# Patient Record
Sex: Female | Born: 1935 | Marital: Married | Smoking: Never smoker
Health system: Southern US, Community
[De-identification: ages and names within clinical notes are randomized; demographics above are authoritative.]

## PROBLEM LIST (undated history)

## (undated) DIAGNOSIS — I5022 Chronic systolic (congestive) heart failure: Secondary | ICD-10-CM

## (undated) DIAGNOSIS — I2510 Atherosclerotic heart disease of native coronary artery without angina pectoris: Secondary | ICD-10-CM

## (undated) DIAGNOSIS — I472 Ventricular tachycardia: Secondary | ICD-10-CM

## (undated) DIAGNOSIS — Z95810 Presence of automatic (implantable) cardiac defibrillator: Secondary | ICD-10-CM

## (undated) DIAGNOSIS — G309 Alzheimer's disease, unspecified: Secondary | ICD-10-CM

## (undated) DIAGNOSIS — I255 Ischemic cardiomyopathy: Secondary | ICD-10-CM

## (undated) DIAGNOSIS — F0280 Dementia in other diseases classified elsewhere without behavioral disturbance: Secondary | ICD-10-CM

## (undated) DIAGNOSIS — E785 Hyperlipidemia, unspecified: Secondary | ICD-10-CM

## (undated) DIAGNOSIS — E119 Type 2 diabetes mellitus without complications: Secondary | ICD-10-CM

## (undated) DIAGNOSIS — I4729 Other ventricular tachycardia: Secondary | ICD-10-CM

## (undated) DIAGNOSIS — I10 Essential (primary) hypertension: Secondary | ICD-10-CM

## (undated) HISTORY — PX: CORONARY ARTERY BYPASS GRAFT: SHX141

## (undated) HISTORY — PX: OTHER SURGICAL HISTORY: SHX169

## (undated) HISTORY — DX: Other ventricular tachycardia: I47.29

## (undated) HISTORY — DX: Ventricular tachycardia: I47.2

## (undated) HISTORY — DX: Ischemic cardiomyopathy: I25.5

---

## 2012-04-04 ENCOUNTER — Other Ambulatory Visit: Payer: Self-pay | Admitting: Physician Assistant

## 2012-04-04 ENCOUNTER — Encounter: Payer: Self-pay | Admitting: Physician Assistant

## 2012-04-04 DIAGNOSIS — I509 Heart failure, unspecified: Secondary | ICD-10-CM

## 2012-04-05 ENCOUNTER — Encounter: Payer: Self-pay | Admitting: Internal Medicine

## 2012-04-05 DIAGNOSIS — R0602 Shortness of breath: Secondary | ICD-10-CM

## 2012-04-05 DIAGNOSIS — R079 Chest pain, unspecified: Secondary | ICD-10-CM

## 2012-04-06 DIAGNOSIS — I5023 Acute on chronic systolic (congestive) heart failure: Secondary | ICD-10-CM

## 2012-04-07 ENCOUNTER — Encounter (HOSPITAL_COMMUNITY)
Admission: RE | Disposition: A | Discharge status: Home / Self Care | Payer: Self-pay | Source: Other Acute Inpatient Hospital | Attending: Cardiovascular Disease

## 2012-04-07 ENCOUNTER — Ambulatory Visit (HOSPITAL_COMMUNITY): Admission: EM | Admit: 2012-04-07 | Payer: Self-pay | Source: Ambulatory Visit | Admitting: Cardiovascular Disease

## 2012-04-07 ENCOUNTER — Other Ambulatory Visit: Payer: Self-pay | Admitting: Physician Assistant

## 2012-04-07 ENCOUNTER — Inpatient Hospital Stay (HOSPITAL_COMMUNITY)
Admission: RE | Admit: 2012-04-07 | Discharge: 2012-04-13 | DRG: 246 | Disposition: A | Discharge status: Home / Self Care | Payer: PRIVATE HEALTH INSURANCE | Source: Other Acute Inpatient Hospital | Attending: Cardiovascular Disease | Admitting: Cardiovascular Disease

## 2012-04-07 DIAGNOSIS — Z951 Presence of aortocoronary bypass graft: Secondary | ICD-10-CM

## 2012-04-07 DIAGNOSIS — I509 Heart failure, unspecified: Secondary | ICD-10-CM | POA: Diagnosis present

## 2012-04-07 DIAGNOSIS — I2589 Other forms of chronic ischemic heart disease: Secondary | ICD-10-CM | POA: Diagnosis present

## 2012-04-07 DIAGNOSIS — E7800 Pure hypercholesterolemia, unspecified: Secondary | ICD-10-CM | POA: Diagnosis present

## 2012-04-07 DIAGNOSIS — I10 Essential (primary) hypertension: Secondary | ICD-10-CM | POA: Diagnosis present

## 2012-04-07 DIAGNOSIS — F0280 Dementia in other diseases classified elsewhere without behavioral disturbance: Secondary | ICD-10-CM | POA: Diagnosis present

## 2012-04-07 DIAGNOSIS — E119 Type 2 diabetes mellitus without complications: Secondary | ICD-10-CM | POA: Diagnosis present

## 2012-04-07 DIAGNOSIS — I447 Left bundle-branch block, unspecified: Secondary | ICD-10-CM | POA: Diagnosis present

## 2012-04-07 DIAGNOSIS — Z79899 Other long term (current) drug therapy: Secondary | ICD-10-CM

## 2012-04-07 DIAGNOSIS — Z955 Presence of coronary angioplasty implant and graft: Secondary | ICD-10-CM

## 2012-04-07 DIAGNOSIS — I5021 Acute systolic (congestive) heart failure: Secondary | ICD-10-CM

## 2012-04-07 DIAGNOSIS — Z7982 Long term (current) use of aspirin: Secondary | ICD-10-CM

## 2012-04-07 DIAGNOSIS — G309 Alzheimer's disease, unspecified: Secondary | ICD-10-CM | POA: Diagnosis present

## 2012-04-07 DIAGNOSIS — I2510 Atherosclerotic heart disease of native coronary artery without angina pectoris: Principal | ICD-10-CM

## 2012-04-07 DIAGNOSIS — I5023 Acute on chronic systolic (congestive) heart failure: Secondary | ICD-10-CM

## 2012-04-07 DIAGNOSIS — Z7902 Long term (current) use of antithrombotics/antiplatelets: Secondary | ICD-10-CM

## 2012-04-07 DIAGNOSIS — I200 Unstable angina: Secondary | ICD-10-CM

## 2012-04-07 HISTORY — PX: LEFT AND RIGHT HEART CATHETERIZATION WITH CORONARY/GRAFT ANGIOGRAM: SHX5448

## 2012-04-07 HISTORY — DX: Dementia in other diseases classified elsewhere, unspecified severity, without behavioral disturbance, psychotic disturbance, mood disturbance, and anxiety: F02.80

## 2012-04-07 HISTORY — DX: Hyperlipidemia, unspecified: E78.5

## 2012-04-07 HISTORY — PX: CARDIAC CATHETERIZATION: SHX172

## 2012-04-07 HISTORY — DX: Chronic systolic (congestive) heart failure: I50.22

## 2012-04-07 HISTORY — DX: Alzheimer's disease, unspecified: G30.9

## 2012-04-07 HISTORY — DX: Essential (primary) hypertension: I10

## 2012-04-07 HISTORY — DX: Type 2 diabetes mellitus without complications: E11.9

## 2012-04-07 HISTORY — DX: Atherosclerotic heart disease of native coronary artery without angina pectoris: I25.10

## 2012-04-07 LAB — BASIC METABOLIC PANEL
Chloride: 95 mEq/L — ABNORMAL LOW (ref 96–112)
Creatinine, Ser: 0.91 mg/dL (ref 0.50–1.10)
GFR calc Af Amer: 70 mL/min — ABNORMAL LOW (ref >90)
Potassium: 3.7 mEq/L (ref 3.5–5.1)
Sodium: 139 mEq/L (ref 135–145)

## 2012-04-07 LAB — POCT I-STAT 3, ART BLOOD GAS (G3+)
TCO2: 33 mmol/L (ref 0–100)
pCO2 arterial: 45.4 mmHg — ABNORMAL HIGH (ref 35.0–45.0)
pH, Arterial: 7.451 — ABNORMAL HIGH (ref 7.350–7.450)

## 2012-04-07 LAB — POCT I-STAT 3, VENOUS BLOOD GAS (G3P V)
Bicarbonate: 31.6 mEq/L — ABNORMAL HIGH (ref 20.0–24.0)
pCO2, Ven: 49.6 mmHg (ref 45.0–50.0)
pH, Ven: 7.412 — ABNORMAL HIGH (ref 7.250–7.300)
pO2, Ven: 32 mmHg (ref 30.0–45.0)

## 2012-04-07 LAB — GLUCOSE, CAPILLARY
Glucose-Capillary: 120 mg/dL — ABNORMAL HIGH (ref 70–99)
Glucose-Capillary: 96 mg/dL (ref 70–99)

## 2012-04-07 LAB — CBC
Platelets: 254 10*3/uL (ref 150–400)
RDW: 13.1 % (ref 11.5–15.5)
WBC: 4.6 10*3/uL (ref 4.0–10.5)

## 2012-04-07 SURGERY — LEFT AND RIGHT HEART CATHETERIZATION WITH CORONARY/GRAFT ANGIOGRAM
Anesthesia: LOCAL

## 2012-04-07 MED ORDER — LIDOCAINE HCL (PF) 1 % IJ SOLN
INTRAMUSCULAR | Status: AC
  Filled 2012-04-07: qty 30

## 2012-04-07 MED ORDER — SODIUM CHLORIDE 0.9 % IV SOLN: 4.0000 mg | Freq: Four times a day (QID) | INTRAVENOUS | Status: DC | PRN

## 2012-04-07 MED ORDER — METFORMIN HCL 500 MG PO TABS
1000.0000 mg | ORAL_TABLET | Freq: Every morning | ORAL | Status: DC
  Filled 2012-04-07 (×2): qty 2

## 2012-04-07 MED ORDER — GLYBURIDE 1.25 MG PO TABS: 5.0000 mg | ORAL_TABLET | Freq: Every morning | ORAL | Status: DC

## 2012-04-07 MED ORDER — ENOXAPARIN SODIUM 150 MG/ML ~~LOC~~ SOLN: 40.0000 mg | SUBCUTANEOUS | Status: DC

## 2012-04-07 MED ORDER — FERROUS GLUCONATE 324 (38 FE) MG PO TABS: 324.0000 mg | ORAL_TABLET | Freq: Every morning | ORAL | Status: DC

## 2012-04-07 MED ORDER — CARVEDILOL 3.125 MG PO TABS: 3.1250 mg | ORAL_TABLET | Freq: Two times a day (BID) | ORAL | Status: DC

## 2012-04-07 MED ORDER — SPIRONOLACTONE 12.5 MG HALF TABLET: 25.0000 mg | ORAL_TABLET | Freq: Every day | ORAL | Status: DC

## 2012-04-07 MED ORDER — SPIRONOLACTONE 25 MG PO TABS
25.0000 mg | ORAL_TABLET | Freq: Every day | ORAL | Status: DC
  Administered 2012-04-08 – 2012-04-13 (×6): 25 mg via ORAL
  Filled 2012-04-07 (×6): qty 1

## 2012-04-07 MED ORDER — ONDANSETRON HCL 4 MG/2ML IJ SOLN: 4.0000 mg | Freq: Four times a day (QID) | INTRAMUSCULAR | Status: DC | PRN

## 2012-04-07 MED ORDER — ENOXAPARIN SODIUM 40 MG/0.4ML ~~LOC~~ SOLN
40.0000 mg | SUBCUTANEOUS | Status: DC
  Administered 2012-04-07 – 2012-04-10 (×4): 40 mg via SUBCUTANEOUS
  Filled 2012-04-07 (×5): qty 0.4

## 2012-04-07 MED ORDER — GLYBURIDE 5 MG PO TABS
5.0000 mg | ORAL_TABLET | Freq: Every morning | ORAL | Status: DC
  Administered 2012-04-08 – 2012-04-10 (×3): 5 mg via ORAL
  Filled 2012-04-07 (×4): qty 1

## 2012-04-07 MED ORDER — ASPIRIN 81 MG PO CHEW: 324.0000 mg | CHEWABLE_TABLET | ORAL | Status: DC

## 2012-04-07 MED ORDER — SODIUM CHLORIDE 0.9 % IV SOLN
INTRAVENOUS | Status: AC
  Administered 2012-04-07: 19:00:00 via INTRAVENOUS

## 2012-04-07 MED ORDER — CARVEDILOL 3.125 MG PO TABS
3.1250 mg | ORAL_TABLET | Freq: Two times a day (BID) | ORAL | Status: DC
  Administered 2012-04-07 – 2012-04-12 (×10): 3.125 mg via ORAL
  Filled 2012-04-07 (×14): qty 1

## 2012-04-07 MED ORDER — NITROGLYCERIN 0.4 MG SL SUBL: 0.4000 mg | SUBLINGUAL_TABLET | SUBLINGUAL | Status: DC | PRN

## 2012-04-07 MED ORDER — SODIUM CHLORIDE 0.9 % IV SOLN
INTRAVENOUS | Status: DC
  Administered 2012-04-07: 15:00:00 via INTRAVENOUS

## 2012-04-07 MED ORDER — FERROUS GLUCONATE 324 (38 FE) MG PO TABS
324.0000 mg | ORAL_TABLET | Freq: Every day | ORAL | Status: DC
  Administered 2012-04-08 – 2012-04-13 (×6): 324 mg via ORAL
  Filled 2012-04-07 (×8): qty 1

## 2012-04-07 MED ORDER — ATORVASTATIN CALCIUM 10 MG PO TABS: 40.0000 mg | ORAL_TABLET | Freq: Every day | ORAL | Status: DC

## 2012-04-07 MED ORDER — DOCUSATE SODIUM 100 MG PO CAPS
100.0000 mg | ORAL_CAPSULE | Freq: Two times a day (BID) | ORAL | Status: DC
  Administered 2012-04-07 – 2012-04-13 (×12): 100 mg via ORAL
  Filled 2012-04-07 (×14): qty 1

## 2012-04-07 MED ORDER — NITROGLYCERIN 0.2 MG/ML ON CALL CATH LAB
INTRAVENOUS | Status: AC
  Filled 2012-04-07: qty 1

## 2012-04-07 MED ORDER — DONEPEZIL HCL 5 MG PO TABS
5.0000 mg | ORAL_TABLET | Freq: Every day | ORAL | Status: DC
  Administered 2012-04-07 – 2012-04-10 (×4): 5 mg via ORAL
  Filled 2012-04-07 (×5): qty 1

## 2012-04-07 MED ORDER — MEMANTINE HCL 5 MG PO TABS: 5.0000 mg | ORAL_TABLET | Freq: Two times a day (BID) | ORAL | Status: DC

## 2012-04-07 MED ORDER — DIAZEPAM 5 MG PO TABS
5.0000 mg | ORAL_TABLET | ORAL | Status: AC
  Administered 2012-04-07: 5 mg via ORAL
  Filled 2012-04-07: qty 1

## 2012-04-07 MED ORDER — ACETAMINOPHEN 325 MG PO TABS: 650.0000 mg | ORAL_TABLET | ORAL | Status: DC | PRN

## 2012-04-07 MED ORDER — HEPARIN (PORCINE) IN NACL 2-0.9 UNIT/ML-% IJ SOLN
INTRAMUSCULAR | Status: AC
  Filled 2012-04-07: qty 2000

## 2012-04-07 MED ORDER — MEMANTINE HCL 5 MG PO TABS
5.0000 mg | ORAL_TABLET | Freq: Two times a day (BID) | ORAL | Status: DC
  Administered 2012-04-07 – 2012-04-11 (×8): 5 mg via ORAL
  Filled 2012-04-07 (×9): qty 1

## 2012-04-07 MED ORDER — SODIUM CHLORIDE 0.45 % IV SOLN: INTRAVENOUS | Status: DC

## 2012-04-07 MED ORDER — ATORVASTATIN CALCIUM 40 MG PO TABS
40.0000 mg | ORAL_TABLET | Freq: Every day | ORAL | Status: DC
  Administered 2012-04-07 – 2012-04-12 (×6): 40 mg via ORAL
  Filled 2012-04-07 (×8): qty 1

## 2012-04-07 MED ORDER — METFORMIN HCL 500 MG PO TABS: 1000.0000 mg | ORAL_TABLET | Freq: Every morning | ORAL | Status: DC

## 2012-04-07 MED ORDER — LEVOTHYROXINE SODIUM 25 MCG PO TABS
25.0000 ug | ORAL_TABLET | Freq: Every day | ORAL | Status: DC
  Administered 2012-04-08 – 2012-04-13 (×6): 25 ug via ORAL
  Filled 2012-04-07 (×8): qty 1

## 2012-04-07 MED ORDER — LISINOPRIL 10 MG PO TABS
10.0000 mg | ORAL_TABLET | Freq: Every day | ORAL | Status: DC
  Administered 2012-04-08 – 2012-04-13 (×6): 10 mg via ORAL
  Filled 2012-04-07 (×6): qty 1

## 2012-04-07 MED ORDER — DONEPEZIL HCL 5 MG PO TABS: 5.0000 mg | ORAL_TABLET | Freq: Every day | ORAL | Status: DC

## 2012-04-07 MED ORDER — LISINOPRIL 2.5 MG PO TABS: 10.0000 mg | ORAL_TABLET | Freq: Every day | ORAL | Status: DC

## 2012-04-07 MED ORDER — FUROSEMIDE 40 MG PO TABS
40.0000 mg | ORAL_TABLET | Freq: Two times a day (BID) | ORAL | Status: DC
  Administered 2012-04-07 – 2012-04-08 (×2): 40 mg via ORAL
  Filled 2012-04-07 (×4): qty 1

## 2012-04-07 MED ORDER — FUROSEMIDE 20 MG PO TABS: 40.0000 mg | ORAL_TABLET | Freq: Two times a day (BID) | ORAL | Status: DC

## 2012-04-07 MED ORDER — LEVOTHYROXINE SODIUM 25 MCG PO TABS: 25.0000 ug | ORAL_TABLET | Freq: Every day | ORAL | Status: DC

## 2012-04-07 MED ORDER — BISACODYL 5 MG PO TBEC
5.0000 mg | DELAYED_RELEASE_TABLET | Freq: Every day | ORAL | Status: DC | PRN
  Filled 2012-04-07 (×2): qty 1

## 2012-04-07 NOTE — CV Procedure (Signed)
Cardiac Cath Note  Dennisha Mouser [id number] Jan 06, 1935  Procedure: right and Left  Heart Cardiac Catheterization Note Indications: Pt has a hx of CAD, s/p CABG in 2009 in [place], [place].  She was transferred from [facility] today for worsening CHF and +/- chest pressure.  She was scheduled for a right and left heart cath.  Procedure Details Consent: Obtained Time Out: Verified patient identification, verified procedure, site/side was marked, verified correct patient position, special equipment/implants available, Radiology Safety Procedures followed,  medications/allergies/relevent history reviewed, required imaging and test results available.  Performed   Medications: Fentanyl: 0 Versed: 0 Lidocaine 20 cc  The right femoral artery and right femoral vein were easily canulated using a modified Seldinger technique.  Hemodynamics:   RA: 4/4 RV: 30/3 PCWP: 9/4 PA:  29/11  Cardiac Output   Thermodilution: 3.39 Index of 2.1  Fick : 3.78   Index 1.88  Arterial Sat: 94% PA Sat: 60%.  LV pressure: 151/8 Aortic pressure: 152/67  Angiography   Left Main: smooth and normal  Left anterior Descending:   Moderate disease in the proximal segment between 50-70%.  The LAD gives off a small diagonal and then has a lucent 70% stenosis.  Competitive filling can be seen coming from the IMA graft.  Ramus Intermediate :  The ramus intermediate is a moderate vessel and is occluded at it's mid point.    Left Circumflex: The LCx is a small - moderate sized vessel .  The OM branches are occluded.  Right Coronary Artery:  Large and dominant.  The entire RCA is moderately diffusely diseased.  There is a tortous 40% stenosis in the proximal vessel.  The mid RCA has a tight 95% stenosis.  There is brisk flow through this stenosis.  The distal RCA has a 30-40% stenosis .  The PDA has a 70% stenosis in the proximal aspect of the branch.  The PLSA has no significant disease.  SVG to Ramus :   The more superior graft is assumed to be to the Ramus branch.   Nice graft.  No stenosis in the graft or native vessel ( assumed to be Ramus)  SVG to Obtuse Marginal :  ( the inferior graft)  Normal graft. Normal native vessel.  LIMA to LAD:  Normal graft.  The anastomosis is normal.  There is competitive flow from the native LAD.  LV Gram: done with hand injection of 7 cc contrast. Severe LV dysfunction.  EF 25-30%  Complications: No apparent complications Patient did tolerate procedure well.  Contrast used: 92 cc   Conclusions:   1. Severe native CAD .  She has patent grafts to the LAD, OM, and assumed ramus branch.  The native RCA does not have any grafts and has a tight 95% stenosis in the mid segment.    The patient was not consented for PCI prior to arriving in the cath lab and she had already been given valium.  We will treat with heparin overnight and plan PCI on Tuesday.   In addition, there was some concern about her renal function and limiting contrast.  Staging the PCI will allow her to clear all of the contrast given and minimize risk of contrast induced nephropathy.  2. Severe LV dysfunction:  She has an EF of 25-30 %.  Her filling pressures are low / normal.  I do not think she needs any further diuresis over what she is getting currently.    Vesta Mixer, Montez Hageman., MD, [facility]  04/07/2012, 5:51 PM Office - [phone] Pager [phone]

## 2012-04-07 NOTE — Interval H&P Note (Signed)
History and Physical Interval Note:  04/07/2012 5:49 PM  Twanda Stakes  has presented today for surgery, with the diagnosis of Chest pain  The various methods of treatment have been discussed with the patient and family. After consideration of risks, benefits and other options for treatment, the patient has consented to  Procedure(s) (LRB): LEFT AND RIGHT HEART CATHETERIZATION WITH CORONARY/GRAFT ANGIOGRAM (N/A) as a surgical intervention .  The patient's history has been reviewed, patient examined, no change in status, stable for surgery.  I have reviewed the patient's chart and labs.  Questions were answered to the patient's satisfaction.     Elyn Aquas.  The consent is for diagnostic cath only.  PCI was not included in the original consent.  The patient has already had valium and we are not able to consent for ad hoc PCI if needed.  Vesta Mixer, Montez Hageman., MD, [facility] 04/07/2012, 5:50 PM Office - [phone] Pager [phone]

## 2012-04-07 NOTE — H&P (View-Only) (Signed)
NAME:  [name], CLAASSEN MAE MOYER ROOM: 208  UNIT NUMBER:  [id number] LOCATION: 31F 208 01 ADM/VISIT DATE:  04/04/2012   ADM Vaughan BrownerKathaleen Grinder:  [id number] DOB: January 07, 1935   PRIMARY CARE PHYSICIAN:  [name], M.D.  REASON FOR CONSULTATION:  Shortness of breath with possible heart failure.  HISTORY OF PRESENT ILLNESS:  The patient is a 77 year old female with a prior history of coronary artery disease, status post coronary artery bypass grafting in 2009 performed at [facility].  The patient is still followed in [facility] by Dr. Vear Clock.  According to her history, she has had no repeat cardiac catheterization after her surgery.  It also does not appear that she had any hospitalizations over the last several years for heart failure.  She was last seen, according to records that I received from [facility], in 11/2010 by Dr. Vear Clock, and the patient appeared to be doing well.  The patient now reports that over the last several days, she developed increased shortness of breath and has noticed lower extremity edema.  She called Dr. Sherryll Burger and wanted to be seen in the office, but unfortunately, there was no availability and she was advised to go to the emergency room.  On admission, she was found to be in heart failure.  The patient was given Lasix with improvement in her symptoms.  She reports that the last 2 days, she has had significant trouble breathing just doing her daily activities.  She also reports orthopnea and PND.  She does not report any chest pain, either at rest or on exertion.  She also does not recall over the last several months any episodes of severe substernal chest pain.  EKG done in the emergency room showed sinus rhythm with left bundle branch block, and it is unclear if this bundle branch block is new or not.  In the interim, the patient has ruled out for myocardial infarction with troponins that are respectively 0.02, 0.03 and 0.04.  Her BNP level was elevated at 378.  She stated that her breathing has  improved after she received Lasix.  The patient does report a history of hypertension and was on several medications, but appeared to be only on hydrochlorothiazide for blood pressure control.  The patient also has diabetes mellitus for which she takes metformin and glyburide.  We have been asked to consult on the patient for evaluation of congestive heart failure and further workup.  ALLERGIES:  No known drug allergies.  MEDICATIONS: 1. Metformin 1000 mg p.o. daily. 2. Hydrochlorothiazide 25 mg p.o. daily. 3. Glyburide 5 mg p.o. daily. 4. Aricept 5 mg p.o. daily. 5. Namenda 5 mg p.o. b.i.d.  PAST MEDICAL HISTORY: 1. Diabetes mellitus. 2. Hypertension. 3. Coronary artery bypass grafting. 4. History of goiter.  SOCIAL HISTORY:  The patient denies any tobacco use, alcohol or drug use.  She lives with her husband.  FAMILY HISTORY:  Noncontributory.  REVIEW OF SYSTEMS:  The patient denies any chest pain, shortness of breath and other cardiovascular are as outlined above.  She denies any nausea or vomiting.  No fever or chills.  No melena or hematochezia.  The remainder of the review of systems is otherwise within normal limits.  PHYSICAL EXAMINATION:  Vital signs:  Blood pressure is 155/67, heart rate 69, respirations 20, temperature 97.9, saturation 100% on 2 liters O2.  General:  A well-nourished, African American female in no distress, not complaining of shortness of breath.  HEENT:  Pupils isochoric, conjunctivae clear.  Oropharynx is clear.  No exudates.  Neck:  Normal carotid upstroke and no carotid bruit.  The thyroid appears to be enlarged on palpation, but is not nodular.  JVP was difficult to evaluate, but appears to be around 8 cm at a 60-degree angle.  Lungs:  Clear breath sounds bilaterally with faint crackles at the bases.  Heart:  PMI is displaced laterally, and there is a palpable third heart sound.  Normal S1, S2.  There is a soft, holosystolic murmur at the apex radiating  into the axilla.  This appears to be consistent with mitral regurgitation, but is hard to quantitate.  Abdomen:  Soft, nontender.  No rebound or guarding.  No hepatosplenomegaly.  Urogenital:  Deferred.  Extremity:  1+ peripheral pitting edema, but no cyanosis or clubbing.  Vascular:  Femoral pulses are 2+ bilaterally with no bruits.  Normal dorsalis pedis and posterior tibial pulses bilaterally.  Neurologic:  The patient is alert, oriented and grossly nonfocal.  Psychiatric:  Somewhat flat affect.  I did not specifically question the patient for short-term memory loss or long-term memory loss, but she does appear that she may have a mild degree of dementia.  LABORATORY WORK:  Blood count:  Within normal limits with a white count of 6100 and a hemoglobin of 12.1, platelet count 213,000.  BMET:  Glucose 200, BUN 13, creatinine 0.84, potassium 3.9.  Cardiac enzymes:  As listed in the HPI, are within normal limits.  X-ray:  Reveals vascular congestion with cardiomegaly and evidence of prior bypass surgery with sternal wires.  BNP is 378.  12-lead electrocardiogram:  Normal sinus rhythm with left bundle branch block.  ASSESSMENT: 1. Acute systolic heart failure. a. Palpable S3 on exam. b. Bedside echocardiogram reveals ejection fraction of 20% to 25% with multiple segmental wall motion abnormalities. c. Elevated beta natriuretic peptide level. 2. Coronary artery disease, status post coronary artery bypass grafting. a. Multiple wall motion abnormalities with evidence of scar in the entire anterior wall and anterior septum.  Also, evidence of scar tissue in the inferior wall and part of the posterior wall. b. Associated mitral regurgitation secondary to #2a.  Evidence of tethered posterior mitral valve leaflets secondary to akinetic segments. 3. Diabetes mellitus. 4. Hypertension, poorly controlled.  PLAN: 1. The patient is followed in [facility] by Dr. Vear Clock.  It is not entirely clear when she was last  seen.  The last record in the chart that we have received notes that she was seen in 11/2010.  She was doing well at that time. 2. It appears that the patient has developed new onset systolic heart failure.  I do not have any measurements on her prior ejection fraction, but clearly by bedside echocardiogram, her ejection fraction is significantly depressed in the range of 20% to 25% with mild to moderate mitral regurgitation and multiple segmental wall motion abnormalities.  There is a tethered posterior mitral valve leaflet, consistent with ischemic mitral regurgitation. 3. The patient's symptoms appear to have worsened over the last couple of days.  Her bypass surgery was in 2009, and she certainly could have developed ischemic heart disease as an angina equivalent in the setting of diabetes mellitus.  I anticipate that the patient will need a cardiac catheterization, but she will require a medical tune-up and optimization of heart failure medications.  She has been started on low-dose Coreg.  We started also lisinopril, and I started the patient on Aldactone in conjunction with her Lasix.  We will follow closely her BMET.  I scheduled  the patient for a rest redistribution thallium 201 study in the a.m. to make sure that she has a viable myocardium, particularly in the anterior wall, inferior wall and posterior wall, and that she could benefit from revascularization therapy.  If she has no evidence of hibernating myocardium, I will try to treat the patient medically first, and then we can leave the decision for cardiac catheterization to her cardiologist, Dr. Vear Clock.  However, if the patient has significant viability, she has agreed to proceed with cardiac catheterization, and she is willing to be transferred to [facility] tomorrow to have a left and right heart catheterization done, including assessment of her grafts.   __________________________    Lewayne Bunting, M.D. Alinda Money D: 04/05/2012  1114 T: 04/05/2012 1207 P: DEG  cc:  [name], M.D.

## 2012-04-07 NOTE — H&P (Signed)
NAME:  [name], [name] ROOM: 208  UNIT NUMBER:  [id number] LOCATION: 2F 208 01 ADM/VISIT DATE:  04/04/2012   ADM PHYS:  ACCT:  [id number] DOB: 12/20/1934   PRIMARY CARE PHYSICIAN:  Ashish Shah, M.D.  REASON FOR CONSULTATION:  Shortness of breath with possible heart failure.  HISTORY OF PRESENT ILLNESS:  The patient is a 77-year-old female with a prior history of coronary artery disease, status post coronary artery bypass grafting in 2009 performed at [place].  The patient is still followed in [place] by Dr. Phillips.  According to her history, she has had no repeat cardiac catheterization after her surgery.  It also does not appear that she had any hospitalizations over the last several years for heart failure.  She was last seen, according to records that I received from [place], in 11/2010 by Dr. Phillips, and the patient appeared to be doing well.  The patient now reports that over the last several days, she developed increased shortness of breath and has noticed lower extremity edema.  She called Dr. Shah and wanted to be seen in the office, but unfortunately, there was no availability and she was advised to go to the emergency room.  On admission, she was found to be in heart failure.  The patient was given Lasix with improvement in her symptoms.  She reports that the last 2 days, she has had significant trouble breathing just doing her daily activities.  She also reports orthopnea and PND.  She does not report any chest pain, either at rest or on exertion.  She also does not recall over the last several months any episodes of severe substernal chest pain.  EKG done in the emergency room showed sinus rhythm with left bundle branch block, and it is unclear if this bundle branch block is new or not.  In the interim, the patient has ruled out for myocardial infarction with troponins that are respectively 0.02, 0.03 and 0.04.  Her BNP level was elevated at 378.  She stated that her breathing has  improved after she received Lasix.  The patient does report a history of hypertension and was on several medications, but appeared to be only on hydrochlorothiazide for blood pressure control.  The patient also has diabetes mellitus for which she takes metformin and glyburide.  We have been asked to consult on the patient for evaluation of congestive heart failure and further workup.  ALLERGIES:  No known drug allergies.  MEDICATIONS: 1. Metformin 1000 mg p.o. daily. 2. Hydrochlorothiazide 25 mg p.o. daily. 3. Glyburide 5 mg p.o. daily. 4. Aricept 5 mg p.o. daily. 5. Namenda 5 mg p.o. b.i.d.  PAST MEDICAL HISTORY: 1. Diabetes mellitus. 2. Hypertension. 3. Coronary artery bypass grafting. 4. History of goiter.  SOCIAL HISTORY:  The patient denies any tobacco use, alcohol or drug use.  She lives with her husband.  FAMILY HISTORY:  Noncontributory.  REVIEW OF SYSTEMS:  The patient denies any chest pain, shortness of breath and other cardiovascular are as outlined above.  She denies any nausea or vomiting.  No fever or chills.  No melena or hematochezia.  The remainder of the review of systems is otherwise within normal limits.  PHYSICAL EXAMINATION:  Vital signs:  Blood pressure is 155/67, heart rate 69, respirations 20, temperature 97.9, saturation 100% on 2 liters O2.  General:  A well-nourished, African American female in no distress, not complaining of shortness of breath.  HEENT:  Pupils isochoric, conjunctivae clear.  Oropharynx is clear.    No exudates.  Neck:  Normal carotid upstroke and no carotid bruit.  The thyroid appears to be enlarged on palpation, but is not nodular.  JVP was difficult to evaluate, but appears to be around 8 cm at a 60-degree angle.  Lungs:  Clear breath sounds bilaterally with faint crackles at the bases.  Heart:  PMI is displaced laterally, and there is a palpable third heart sound.  Normal S1, S2.  There is a soft, holosystolic murmur at the apex radiating  into the axilla.  This appears to be consistent with mitral regurgitation, but is hard to quantitate.  Abdomen:  Soft, nontender.  No rebound or guarding.  No hepatosplenomegaly.  Urogenital:  Deferred.  Extremity:  1+ peripheral pitting edema, but no cyanosis or clubbing.  Vascular:  Femoral pulses are 2+ bilaterally with no bruits.  Normal dorsalis pedis and posterior tibial pulses bilaterally.  Neurologic:  The patient is alert, oriented and grossly nonfocal.  Psychiatric:  Somewhat flat affect.  I did not specifically question the patient for short-term memory loss or long-term memory loss, but she does appear that she may have a mild degree of dementia.  LABORATORY WORK:  Blood count:  Within normal limits with a white count of 6100 and a hemoglobin of 12.1, platelet count 213,000.  BMET:  Glucose 200, BUN 13, creatinine 0.84, potassium 3.9.  Cardiac enzymes:  As listed in the HPI, are within normal limits.  X-ray:  Reveals vascular congestion with cardiomegaly and evidence of prior bypass surgery with sternal wires.  BNP is 378.  12-lead electrocardiogram:  Normal sinus rhythm with left bundle branch block.  ASSESSMENT: 1. Acute systolic heart failure. a. Palpable S3 on exam. b. Bedside echocardiogram reveals ejection fraction of 20% to 25% with multiple segmental wall motion abnormalities. c. Elevated beta natriuretic peptide level. 2. Coronary artery disease, status post coronary artery bypass grafting. a. Multiple wall motion abnormalities with evidence of scar in the entire anterior wall and anterior septum.  Also, evidence of scar tissue in the inferior wall and part of the posterior wall. b. Associated mitral regurgitation secondary to #2a.  Evidence of tethered posterior mitral valve leaflets secondary to akinetic segments. 3. Diabetes mellitus. 4. Hypertension, poorly controlled.  PLAN: 1. The patient is followed in [place] by Dr. Phillips.  It is not entirely clear when she was last  seen.  The last record in the chart that we have received notes that she was seen in 11/2010.  She was doing well at that time. 2. It appears that the patient has developed new onset systolic heart failure.  I do not have any measurements on her prior ejection fraction, but clearly by bedside echocardiogram, her ejection fraction is significantly depressed in the range of 20% to 25% with mild to moderate mitral regurgitation and multiple segmental wall motion abnormalities.  There is a tethered posterior mitral valve leaflet, consistent with ischemic mitral regurgitation. 3. The patient's symptoms appear to have worsened over the last couple of days.  Her bypass surgery was in 2009, and she certainly could have developed ischemic heart disease as an angina equivalent in the setting of diabetes mellitus.  I anticipate that the patient will need a cardiac catheterization, but she will require a medical tune-up and optimization of heart failure medications.  She has been started on low-dose Coreg.  We started also lisinopril, and I started the patient on Aldactone in conjunction with her Lasix.  We will follow closely her BMET.  I scheduled   the patient for a rest redistribution thallium 201 study in the a.m. to make sure that she has a viable myocardium, particularly in the anterior wall, inferior wall and posterior wall, and that she could benefit from revascularization therapy.  If she has no evidence of hibernating myocardium, I will try to treat the patient medically first, and then we can leave the decision for cardiac catheterization to her cardiologist, Dr. Phillips.  However, if the patient has significant viability, she has agreed to proceed with cardiac catheterization, and she is willing to be transferred to [place] tomorrow to have a left and right heart catheterization done, including assessment of her grafts.   __________________________    GUY DEGENT, M.D. /landm D: 04/05/2012  1114 T: 04/05/2012 1207 P: DEG  cc:  ASHISH SHAH, M.D.   

## 2012-04-07 NOTE — CV Procedure (Signed)
Rest redistribution thallium study was performed yesterday. There was good uptake of thallium throughout the myocardium during the rest images without any significant defects. On redistribution there was no filling of defects. 3 mCi of thallium 201 was used with 4 hour reimaging without reinjection. The patient has a history of coronary disease and coronary bypass grafting and based on this study despite her severe LV systolic dysfunction she appears to have significant viable myocardium.  Alvin Critchley Gent 1:09 PM 04/07/2012

## 2012-04-08 DIAGNOSIS — I509 Heart failure, unspecified: Secondary | ICD-10-CM

## 2012-04-08 DIAGNOSIS — I5021 Acute systolic (congestive) heart failure: Secondary | ICD-10-CM

## 2012-04-08 LAB — GLUCOSE, CAPILLARY: Glucose-Capillary: 191 mg/dL — ABNORMAL HIGH (ref 70–99)

## 2012-04-08 MED ORDER — ASPIRIN 81 MG PO CHEW
324.0000 mg | CHEWABLE_TABLET | Freq: Every day | ORAL | Status: DC
  Administered 2012-04-08 – 2012-04-10 (×3): 324 mg via ORAL
  Filled 2012-04-08 (×4): qty 4

## 2012-04-08 MED ORDER — FUROSEMIDE 40 MG PO TABS
40.0000 mg | ORAL_TABLET | Freq: Two times a day (BID) | ORAL | Status: AC
  Administered 2012-04-10 (×2): 40 mg via ORAL
  Filled 2012-04-08 (×2): qty 1

## 2012-04-08 MED ORDER — FUROSEMIDE 40 MG PO TABS
40.0000 mg | ORAL_TABLET | Freq: Two times a day (BID) | ORAL | Status: DC
  Administered 2012-04-12 (×2): 40 mg via ORAL
  Filled 2012-04-08 (×3): qty 1

## 2012-04-08 NOTE — Progress Notes (Signed)
Peyton Bottoms, MD, [facility] ABIM Board Certified in Adult Cardiovascular Medicine,Internal Medicine and Critical Care Medicine      Subjective:    S/p cath, no SCP. No SOB, chf well compensated. No complications post cath    Objective:   Weight Range:  Vital Signs:   Temp:  [97.6 F (36.4 C)-98.6 F (37 C)] 98.1 F (36.7 C) (08/31 0400) Pulse Rate:  [68-96] 89  (08/31 0400) Resp:  [18] 18  (08/30 1616) BP: (97-155)/(45-105) 120/78 mmHg (08/31 0600) SpO2:  [96 %-99 %] 99 % (08/31 0400) Weight:  [173 lb 12.8 oz (78.835 kg)] 173 lb 12.8 oz (78.835 kg) (08/30 1403) Last BM Date: 04/05/12  Weight change: Filed Weights   04/07/12 1403  Weight: 173 lb 12.8 oz (78.835 kg)    Intake/Output:  No intake or output data in the 24 hours ending 04/08/12 0853   Physical Exam: Gen:no distress, lying flat Neck: nl carotid upstroke, no bruits Lungs clear BS Cor: RRR soft systolic m. Audible S3 Abd; soft, non tender Ext ; no edema    Telemetry:NSR Labs: Basic Metabolic Panel:  Lab 04/07/12 2956  NA 139  K 3.7  CL 95*  CO2 33*  GLUCOSE 113*  BUN 19  CREATININE 0.91  CALCIUM 9.5  MG --  PHOS --    Liver Function Tests: No results found for this basename: AST:5,ALT:5,ALKPHOS:5,BILITOT:5,PROT:5,ALBUMIN:5 in the last 168 hours No results found for this basename: LIPASE:5,AMYLASE:5 in the last 168 hours No results found for this basename: AMMONIA:3 in the last 168 hours  CBC:  Lab 04/07/12 1531  WBC 4.6  NEUTROABS --  HGB 13.7  HCT 41.2  MCV 88.6  PLT 254    Cardiac Enzymes: No results found for this basename: CKTOTAL:5,CKMB:5,CKMBINDEX:5,TROPONINI:5 in the last 168 hours   BNP: BNP (last 3 results) No results found for this basename: PROBNP:3 in the last 8760 hours  ABG    Component Value Date/Time   PHART 7.451* 04/07/2012 1714   PCO2ART 45.4* 04/07/2012 1714   PO2ART 70.0* 04/07/2012 1714   HCO3 31.6* 04/07/2012 1719   TCO2 33 04/07/2012 1719   O2SAT  60.0 04/07/2012 1719     Other results:  EKG: NSR  Imaging:  No results found.   Medications:     Scheduled Medications:    . atorvastatin  40 mg Oral q1800  . carvedilol  3.125 mg Oral BID WC  . diazepam  5 mg Oral On Call  . docusate sodium  100 mg Oral BID  . donepezil  5 mg Oral QHS  . enoxaparin (LOVENOX) injection  40 mg Subcutaneous Q24H  . ferrous gluconate  324 mg Oral Q breakfast  . furosemide  40 mg Oral BID  . glyBURIDE  5 mg Oral q morning - 10a  . heparin      . levothyroxine  25 mcg Oral QAC breakfast  . lidocaine      . lisinopril  10 mg Oral Daily  . memantine  5 mg Oral BID  . metFORMIN  1,000 mg Oral q morning - 10a  . nitroGLYCERIN      . spironolactone  25 mg Oral Daily  . DISCONTD: aspirin  324 mg Oral Pre-Cath  . DISCONTD: aspirin  324 mg Oral NOW     Infusions:    . sodium chloride 75 mL/hr at 04/07/12 1859  . DISCONTD: sodium chloride 50 mL/hr at 04/07/12 1503     PRN Medications:  acetaminophen, bisacodyl, nitroGLYCERIN,  ondansetron (ZOFRAN) IV, DISCONTD: acetaminophen, DISCONTD: ondansetron (ZOFRAN) IV   Assessment:   1. Acute systolic CHF (congestive heart failure)    EF= 20-25% Filling pressures now normal S/p cath patent grafts to LAD, OM and ramus. Native RCA=95% Viable myocardium inferior wall on rest- redistribution thallium Anterior scar echo - but also viable.  Plan/Discussion:    Compensated CHF PCI native RCA Tuesday Hold metformin ASA not on med record Hold Lasix for 2 days - low filling pressures Monitor K on aldactone/lisinopril w/o lasix for 2 days.      Length of Stay: 1   Alvin Critchley [facility] 04/08/2012, 8:53 AM

## 2012-04-08 NOTE — Plan of Care (Signed)
Problem: Phase I Progression Outcomes Goal: Post Cath/PCI return to appropriate Path Outcome: Progressing PCI planned for Tues

## 2012-04-09 LAB — BASIC METABOLIC PANEL
BUN: 18 mg/dL (ref 6–23)
Creatinine, Ser: 0.87 mg/dL (ref 0.50–1.10)
GFR calc Af Amer: 74 mL/min — ABNORMAL LOW (ref >90)
GFR calc non Af Amer: 64 mL/min — ABNORMAL LOW (ref >90)
Potassium: 3.3 mEq/L — ABNORMAL LOW (ref 3.5–5.1)

## 2012-04-09 LAB — GLUCOSE, CAPILLARY
Glucose-Capillary: 239 mg/dL — ABNORMAL HIGH (ref 70–99)
Glucose-Capillary: 167 mg/dL — ABNORMAL HIGH (ref 70–99)

## 2012-04-09 MED ORDER — POTASSIUM CHLORIDE CRYS ER 20 MEQ PO TBCR
40.0000 meq | EXTENDED_RELEASE_TABLET | Freq: Once | ORAL | Status: AC
  Administered 2012-04-09: 40 meq via ORAL
  Filled 2012-04-09: qty 2

## 2012-04-09 NOTE — Progress Notes (Signed)
Peyton Bottoms, MD, [facility] ABIM Board Certified in Adult Cardiovascular Medicine,Internal Medicine and Critical Care Medicine      Subjective:    No sob, no SCP. No nausea, no fever chills     Objective:   Weight Range:  Vital Signs:   Temp:  [98.6 F (37 C)-98.9 F (37.2 C)] 98.8 F (37.1 C) (09/01 0440) Pulse Rate:  [75-78] 75  (09/01 0440) Resp:  [16-20] 16  (09/01 0440) BP: (118-153)/(67-76) 118/72 mmHg (09/01 0440) SpO2:  [92 %-95 %] 95 % (09/01 0440) Last BM Date: 04/07/12  Weight change: Filed Weights   04/07/12 1403  Weight: 173 lb 12.8 oz (78.835 kg)    Intake/Output:   Intake/Output Summary (Last 24 hours) at 04/09/12 0845 Last data filed at 04/09/12 0831  Gross per 24 hour  Intake    480 ml  Output    800 ml  Net   -320 ml     Physical Exam: Gen:no distress, lying flat  Neck: nl carotid upstroke, no bruits  Lungs clear BS  Cor: RRR soft systolic m. Audible S3  Abd; soft, non tender  Ext ; no edema   Telemetry: NSR Labs: Basic Metabolic Panel:  Lab 04/09/12 9604 04/07/12 1531  NA 140 139  K 3.3* 3.7  CL 99 95*  CO2 32 33*  GLUCOSE 165* 113*  BUN 18 19  CREATININE 0.87 0.91  CALCIUM 9.2 9.5  MG -- --  PHOS -- --    Liver Function Tests:   No results found for this basename: AMMONIA:3 in the last 168 hours  CBC:  Lab 04/07/12 1531  WBC 4.6  NEUTROABS --  HGB 13.7  HCT 41.2  MCV 88.6  PLT 254    Cardiac Enzymes: No results found for this basename: CKTOTAL:5,CKMB:5,CKMBINDEX:5,TROPONINI:5 in the last 168 hours   BNP: BNP (last 3 results) No results found for this basename: PROBNP:3 in the last 8760 hours  ABG    Component Value Date/Time   PHART 7.451* 04/07/2012 1714   PCO2ART 45.4* 04/07/2012 1714   PO2ART 70.0* 04/07/2012 1714   HCO3 31.6* 04/07/2012 1719   TCO2 33 04/07/2012 1719   O2SAT 60.0 04/07/2012 1719     Other results: EKG:NSR Imaging:  No results found.   Medications:     Scheduled  Medications:    . aspirin  324 mg Oral Daily  . atorvastatin  40 mg Oral q1800  . carvedilol  3.125 mg Oral BID WC  . docusate sodium  100 mg Oral BID  . donepezil  5 mg Oral QHS  . enoxaparin (LOVENOX) injection  40 mg Subcutaneous Q24H  . ferrous gluconate  324 mg Oral Q breakfast  . furosemide  40 mg Oral BID  . furosemide  40 mg Oral BID  . glyBURIDE  5 mg Oral q morning - 10a  . levothyroxine  25 mcg Oral QAC breakfast  . lisinopril  10 mg Oral Daily  . memantine  5 mg Oral BID  . metFORMIN  1,000 mg Oral q morning - 10a  . spironolactone  25 mg Oral Daily  . DISCONTD: furosemide  40 mg Oral BID     Infusions:     PRN Medications:  acetaminophen, bisacodyl, nitroGLYCERIN, ondansetron (ZOFRAN) IV   Assessment:   1. Acute systolic CHF (congestive heart failure)   EF= 20-25%  Filling pressures now normal  S/p cath patent grafts to LAD, OM and ramus. Native RCA=95%  Viable myocardium inferior wall  on rest- redistribution thallium  Anterior scar echo - but also viable.    Plan/Discussion:    PCI native RCA on Tuesday Hold metformin K low will give Kdur X1 Lasix on hold.     Length of Stay: 2   Alvin Critchley [facility] 04/09/2012, 8:45 AM

## 2012-04-10 ENCOUNTER — Encounter (HOSPITAL_COMMUNITY): Payer: Self-pay | Admitting: General Practice

## 2012-04-10 LAB — BASIC METABOLIC PANEL
Calcium: 9.4 mg/dL (ref 8.4–10.5)
Chloride: 101 mEq/L (ref 96–112)
Creatinine, Ser: 0.76 mg/dL (ref 0.50–1.10)
GFR calc Af Amer: >90 mL/min (ref >90)

## 2012-04-10 LAB — GLUCOSE, CAPILLARY
Glucose-Capillary: 146 mg/dL — ABNORMAL HIGH (ref 70–99)
Glucose-Capillary: 191 mg/dL — ABNORMAL HIGH (ref 70–99)

## 2012-04-10 MED ORDER — CLOPIDOGREL BISULFATE 75 MG PO TABS
300.0000 mg | ORAL_TABLET | Freq: Once | ORAL | Status: AC
  Administered 2012-04-10: 300 mg via ORAL
  Filled 2012-04-10: qty 4

## 2012-04-10 MED ORDER — ASPIRIN 81 MG PO CHEW
324.0000 mg | CHEWABLE_TABLET | ORAL | Status: AC
  Administered 2012-04-11: 324 mg via ORAL

## 2012-04-10 MED ORDER — CLOPIDOGREL BISULFATE 75 MG PO TABS
300.0000 mg | ORAL_TABLET | ORAL | Status: DC
  Filled 2012-04-10 (×2): qty 4

## 2012-04-10 MED ORDER — SODIUM CHLORIDE 0.9 % IV SOLN
1.0000 mL/kg/h | INTRAVENOUS | Status: DC
  Administered 2012-04-11 (×2): 1 mL/kg/h via INTRAVENOUS

## 2012-04-10 MED ORDER — CLOPIDOGREL BISULFATE 75 MG PO TABS: 75.0000 mg | ORAL_TABLET | Freq: Every day | ORAL | Status: DC

## 2012-04-10 MED ORDER — SODIUM CHLORIDE 0.9 % IV SOLN: 250.0000 mL | INTRAVENOUS | Status: DC | PRN

## 2012-04-10 MED ORDER — SODIUM CHLORIDE 0.9 % IJ SOLN: 3.0000 mL | INTRAMUSCULAR | Status: DC | PRN

## 2012-04-10 MED ORDER — SODIUM CHLORIDE 0.9 % IJ SOLN
3.0000 mL | Freq: Two times a day (BID) | INTRAMUSCULAR | Status: DC
  Administered 2012-04-10: 3 mL via INTRAVENOUS

## 2012-04-10 MED ORDER — CLOPIDOGREL BISULFATE 75 MG PO TABS
75.0000 mg | ORAL_TABLET | Freq: Every day | ORAL | Status: DC
  Administered 2012-04-12 – 2012-04-13 (×2): 75 mg via ORAL
  Filled 2012-04-10 (×2): qty 1

## 2012-04-10 NOTE — Progress Notes (Signed)
    Subjective:  No chest pain or dyspnea.  Objective:  Vital Signs in the last 24 hours: Temp:  [98.6 F (37 C)-99 F (37.2 C)] 98.6 F (37 C) (09/02 0500) Pulse Rate:  [72-83] 83  (09/02 0500) Resp:  [16-18] 16  (09/02 0500) BP: (129-146)/(77-81) 129/78 mmHg (09/02 0500) SpO2:  [94 %-95 %] 94 % (09/02 0500)  Intake/Output from previous day: 09/01 0701 - 09/02 0700 In: 600 [P.O.:600] Out: -   Physical Exam: Pt is alert and oriented, elderly woman in NAD HEENT: normal Neck: JVP - normal, carotids 2+= without bruits Lungs: CTA bilaterally CV: RRR with grade 2/6 systolic ejection murmur at the left sternal border, paradoxically split S2. Abd: soft, NT, Positive BS, no hepatomegaly Ext: no C/C/E, distal pulses intact and equal Skin: warm/dry no rash  Lab Results:  Basename 04/07/12 1531  WBC 4.6  HGB 13.7  PLT 254    Basename 04/10/12 0612 04/09/12 0500  NA 141 140  K 3.5 3.3*  CL 101 99  CO2 31 32  GLUCOSE 179* 165*  BUN 13 18  CREATININE 0.76 0.87   No results found for this basename: TROPONINI:2,CK,MB:2 in the last 72 hours  Tele: Personally reviewed, sinus rhythm.  Assessment/Plan:  1. Acute on chronic systolic heart failure with left ventricular ejection fraction less than 30%. 2. Severe multivessel coronary artery disease, with the critical mid right coronary artery stenosis (ungrafted vessel) 3. Type 2 diabetes  I have reviewed the patient's cardiac catheterization films. I agree that PCI of the right coronary artery is appropriate considering her congestive heart failure and demonstrated viability of the inferior wall. She will be loaded with Plavix. A P2Y12 test will be checked in the morning. The patient's films have been reviewed with Dr. Riley Kill who will perform her PCI.  Tonny Bollman, M.D. 04/10/2012, 9:48 AM

## 2012-04-11 ENCOUNTER — Encounter (HOSPITAL_COMMUNITY): Payer: Self-pay | Admitting: Cardiology

## 2012-04-11 ENCOUNTER — Encounter (HOSPITAL_COMMUNITY)
Admission: RE | Disposition: A | Discharge status: Home / Self Care | Payer: Self-pay | Source: Other Acute Inpatient Hospital | Attending: Cardiovascular Disease

## 2012-04-11 DIAGNOSIS — I2510 Atherosclerotic heart disease of native coronary artery without angina pectoris: Secondary | ICD-10-CM

## 2012-04-11 HISTORY — PX: PERCUTANEOUS CORONARY STENT INTERVENTION (PCI-S): SHX5485

## 2012-04-11 LAB — BASIC METABOLIC PANEL WITH GFR
BUN: 14 mg/dL (ref 6–23)
CO2: 31 meq/L (ref 19–32)
Calcium: 9.3 mg/dL (ref 8.4–10.5)
Chloride: 98 meq/L (ref 96–112)
Creatinine, Ser: 0.8 mg/dL (ref 0.50–1.10)
GFR calc Af Amer: 82 mL/min — ABNORMAL LOW
GFR calc non Af Amer: 70 mL/min — ABNORMAL LOW
Glucose, Bld: 176 mg/dL — ABNORMAL HIGH (ref 70–99)
Potassium: 3.7 meq/L (ref 3.5–5.1)
Sodium: 140 meq/L (ref 135–145)

## 2012-04-11 LAB — PROTIME-INR
INR: 1.03 (ref 0.00–1.49)
Prothrombin Time: 13.7 seconds (ref 11.6–15.2)

## 2012-04-11 LAB — POCT ACTIVATED CLOTTING TIME: Activated Clotting Time: 399 seconds

## 2012-04-11 LAB — GLUCOSE, CAPILLARY
Glucose-Capillary: 170 mg/dL — ABNORMAL HIGH (ref 70–99)
Glucose-Capillary: 205 mg/dL — ABNORMAL HIGH (ref 70–99)
Glucose-Capillary: 184 mg/dL — ABNORMAL HIGH (ref 70–99)

## 2012-04-11 LAB — PLATELET INHIBITION P2Y12: Platelet Function  P2Y12: 136 [PRU] — ABNORMAL LOW (ref 194–418)

## 2012-04-11 SURGERY — PERCUTANEOUS CORONARY STENT INTERVENTION (PCI-S)
Anesthesia: LOCAL

## 2012-04-11 MED ORDER — HEPARIN (PORCINE) IN NACL 2-0.9 UNIT/ML-% IJ SOLN
INTRAMUSCULAR | Status: AC
  Filled 2012-04-11: qty 2000

## 2012-04-11 MED ORDER — SODIUM CHLORIDE 0.9 % IV SOLN
INTRAVENOUS | Status: AC
  Administered 2012-04-11: 18:00:00 via INTRAVENOUS

## 2012-04-11 MED ORDER — NITROGLYCERIN IN D5W 200-5 MCG/ML-% IV SOLN
INTRAVENOUS | Status: AC
  Filled 2012-04-11: qty 250

## 2012-04-11 MED ORDER — GLYBURIDE 5 MG PO TABS
5.0000 mg | ORAL_TABLET | Freq: Every day | ORAL | Status: DC
Start: 2012-04-12 — End: 2012-04-13
  Administered 2012-04-12 – 2012-04-13 (×2): 5 mg via ORAL
  Filled 2012-04-11 (×3): qty 1

## 2012-04-11 MED ORDER — BIVALIRUDIN 250 MG IV SOLR
INTRAVENOUS | Status: AC
  Filled 2012-04-11: qty 250

## 2012-04-11 MED ORDER — LIDOCAINE HCL (PF) 1 % IJ SOLN
INTRAMUSCULAR | Status: AC
  Filled 2012-04-11: qty 30

## 2012-04-11 MED ORDER — ASPIRIN 81 MG PO CHEW
81.0000 mg | CHEWABLE_TABLET | Freq: Every day | ORAL | Status: DC
  Administered 2012-04-12 – 2012-04-13 (×2): 81 mg via ORAL
  Filled 2012-04-11 (×3): qty 1

## 2012-04-11 MED ORDER — ENOXAPARIN SODIUM 40 MG/0.4ML ~~LOC~~ SOLN: 40.0000 mg | SUBCUTANEOUS | Status: DC

## 2012-04-11 MED ORDER — ACETAMINOPHEN 325 MG PO TABS: 650.0000 mg | ORAL_TABLET | ORAL | Status: DC | PRN

## 2012-04-11 MED ORDER — CLOPIDOGREL BISULFATE 75 MG PO TABS: 300.0000 mg | ORAL_TABLET | Freq: Every day | ORAL | Status: DC

## 2012-04-11 MED ORDER — NITROGLYCERIN 0.2 MG/ML ON CALL CATH LAB
INTRAVENOUS | Status: AC
  Filled 2012-04-11: qty 1

## 2012-04-11 MED ORDER — CLOPIDOGREL BISULFATE 75 MG PO TABS
300.0000 mg | ORAL_TABLET | Freq: Once | ORAL | Status: AC
  Administered 2012-04-11: 300 mg via ORAL
  Filled 2012-04-11: qty 1

## 2012-04-11 MED ORDER — ONDANSETRON HCL 4 MG/2ML IJ SOLN: 4.0000 mg | Freq: Four times a day (QID) | INTRAMUSCULAR | Status: DC | PRN

## 2012-04-11 MED ORDER — SODIUM CHLORIDE 0.9 % IV SOLN
0.2500 mg/kg/h | INTRAVENOUS | Status: DC
  Filled 2012-04-11: qty 250

## 2012-04-11 MED ORDER — MIDAZOLAM HCL 2 MG/2ML IJ SOLN
INTRAMUSCULAR | Status: AC
  Filled 2012-04-11: qty 2

## 2012-04-11 MED ORDER — MEMANTINE HCL 5 MG PO TABS: 5.0000 mg | ORAL_TABLET | Freq: Two times a day (BID) | ORAL | Status: DC

## 2012-04-11 MED ORDER — ATROPINE SULFATE 1 MG/ML IJ SOLN
INTRAMUSCULAR | Status: AC
  Filled 2012-04-11: qty 1

## 2012-04-11 MED ORDER — DONEPEZIL HCL 5 MG PO TABS: 5.0000 mg | ORAL_TABLET | Freq: Every day | ORAL | Status: DC

## 2012-04-11 NOTE — CV Procedure (Signed)
   CARDIAC CATH NOTE  Name: Teleah Villamar MRN: [id number] DOB: [birth year]  Procedure: PTCA and stenting of the RCA  Indication: CHF with critical RCA disease.  See notes.    Procedural Details: The left groin was prepped, draped, and anesthetized with 1% lidocaine. Using the modified Seldinger technique, a 6 Fr sheath was introduced into the left femoral artery.  Weight-based bivalirudin was given for anticoagulation. Once a therapeutic ACT was achieved, a 6 [place] JR4 with [facility] guide catheter was inserted.  A Luge coronary guidewire was used to cross the lesion.  The lesion was predilated with a 2.49mm balloon.   We had difficulty getting the stent to the site of the lesion.  A Promus stent was abandoned for an Expedition stent after it would not go down with two wires.  The lesion was then stented with a 2.25 by 28mm Abbott Expedition stent.  The stent was postdilated with a 2.5  noncompliant balloon.  We then examined an area above the stent with hypodensity, representing either edge disruption or a bend where the previous stent ended.  It was not relieved with pulling back the wire or NTG.  We then stented with an overlapping 8.mm by 2.36mm Abbott Expedition stent.  Following PCI, there was 0% residual stenosis at the lesion sites  and TIMI-3 flow. Final angiography confirmed this.. The patient tolerated the procedure well. There were no immediate procedural complications. Femoral sheath was secured into place.. The patient was transferred to the post catheterization recovery area for further monitoring.  Lesion Data: Vessel: RCA Percent stenosis (pre): 95% TIMI-flow (pre):  3 Stent:  28 by 2.25 mm Expedition and 8mm by 2.25 Expedition overlapping stents mid vessel, post dil to 2.4 with .   Percent stenosis (post): 0 TIMI-flow (post): 3  Angio:  The RCA was known to be severely diseased.  There was 50% ostial narrowing.  There was 40%-50%  diffuse narrowing through the proximal Shepherd's crook.   There was typical diabetic vasculopathy throughout.  Near the mid distal junction, near the acute margin, there was a focal 95% lesion and then 70% segmental disease.  Beyond this, there was 50-60% diffuse narrowing, then typical pruned PDA and PLA vessels with evidence of diabetic disease. Following stenting, these were improved.    Conclusions:  1.  Successful mid vessel PCI with improved appearance of the target lesion in a significantly diseased diabetic artery.   Recommendations:  1.  DAPT  2.  CHF management. 3.  EP consult re: candidacy for Defib.    Shawnie Pons 04/11/2012, 6:36 PM

## 2012-04-11 NOTE — Interval H&P Note (Signed)
History and Physical Interval Note:  04/11/2012 4:05 PM  [name]  has presented today for surgery, with the diagnosis of cp  The various methods of treatment have been discussed with the patient and family. After consideration of risks, benefits and other options for treatment, the patient has consented to  Procedure(s) (LRB) with comments: PERCUTANEOUS CORONARY STENT INTERVENTION (PCI-S) (N/A) as a surgical intervention .  The patient's history has been reviewed, patient examined, no change in status, stable for surgery.  I have reviewed the patient's chart and labs.  Questions were answered to the patient's satisfaction.  All of her data was carefully reviewed.    Shawnie Pons

## 2012-04-11 NOTE — Progress Notes (Signed)
Inpatient Diabetes Program Recommendations  AACE/ADA: New Consensus Statement on Inpatient Glycemic Control (2013)  Target Ranges:  Prepandial:   less than 140 mg/dL      Peak postprandial:   less than 180 mg/dL (1-2 hours)      Critically ill patients:  140 - 180 mg/dL   Results for DELPHIA, KAYLOR (MRN [id number]) as of 04/11/2012 11:22  Ref. Range 04/10/2012 07:14 04/10/2012 11:41 04/10/2012 16:37 04/10/2012 19:31  Glucose-Capillary Latest Range: 70-99 mg/dL 782 (H) 956 (H) 213 (H) 217 (H)    Inpatient Diabetes Program Recommendations Correction (SSI): Please add Novolog Moderate correction scale (SSi) tid ac + HS. HgbA1C: Please check A1c to assess pre-hospital glucose control.  Note: Will follow. Ambrose Finland RN, MSN, CDE Diabetes Coordinator Inpatient Diabetes Program [phone]

## 2012-04-11 NOTE — H&P (View-Only) (Signed)
For PCI later today.  Dr. Excell Seltzer asked me to review the films.  She has LV dysfunction and ungrafted RCA with high grade disease, and viable inferior myocardium.  I explained to her the risks and options and she has consented to proceed.  She has a somewhat flat affect, and her husband was in the room.  We plan to proceed later today. She will be given a clear liquid breakfast.

## 2012-04-11 NOTE — Progress Notes (Signed)
Two RN on the floor attempted to start an IV on the pt and were unsuccessful. IV team was called. The pt's IV that she had in her RT forearm infiltrated with her Pre-Cath fluids and was removed with heat applied. IV team was unable to get two IVs started. Sanda Linger

## 2012-04-11 NOTE — Progress Notes (Signed)
For PCI later today.  Dr. Cooper asked me to review the films.  She has LV dysfunction and ungrafted RCA with high grade disease, and viable inferior myocardium.  I explained to her the risks and options and she has consented to proceed.  She has a somewhat flat affect, and her husband was in the room.  We plan to proceed later today. She will be given a clear liquid breakfast.   

## 2012-04-12 DIAGNOSIS — I5021 Acute systolic (congestive) heart failure: Secondary | ICD-10-CM | POA: Diagnosis present

## 2012-04-12 DIAGNOSIS — I10 Essential (primary) hypertension: Secondary | ICD-10-CM | POA: Diagnosis present

## 2012-04-12 DIAGNOSIS — E7800 Pure hypercholesterolemia, unspecified: Secondary | ICD-10-CM | POA: Diagnosis present

## 2012-04-12 DIAGNOSIS — E119 Type 2 diabetes mellitus without complications: Secondary | ICD-10-CM | POA: Diagnosis present

## 2012-04-12 DIAGNOSIS — Z9861 Coronary angioplasty status: Secondary | ICD-10-CM

## 2012-04-12 LAB — BASIC METABOLIC PANEL
BUN: 12 mg/dL (ref 6–23)
Calcium: 9.1 mg/dL (ref 8.4–10.5)
Creatinine, Ser: 0.74 mg/dL (ref 0.50–1.10)
GFR calc Af Amer: >90 mL/min (ref >90)
GFR calc non Af Amer: 81 mL/min — ABNORMAL LOW (ref >90)

## 2012-04-12 LAB — CBC
Hemoglobin: 12.1 g/dL (ref 12.0–15.0)
MCH: 29.2 pg (ref 26.0–34.0)
MCV: 87.9 fL (ref 78.0–100.0)
RBC: 4.14 MIL/uL (ref 3.87–5.11)

## 2012-04-12 LAB — GLUCOSE, CAPILLARY
Glucose-Capillary: 180 mg/dL — ABNORMAL HIGH (ref 70–99)
Glucose-Capillary: 113 mg/dL — ABNORMAL HIGH (ref 70–99)

## 2012-04-12 MED ORDER — FUROSEMIDE 40 MG PO TABS
40.0000 mg | ORAL_TABLET | Freq: Every day | ORAL | Status: DC
  Administered 2012-04-13: 09:00:00 40 mg via ORAL
  Filled 2012-04-12: qty 1

## 2012-04-12 MED ORDER — INSULIN ASPART 100 UNIT/ML ~~LOC~~ SOLN: 0.0000 [IU] | Freq: Three times a day (TID) | SUBCUTANEOUS | Status: DC

## 2012-04-12 MED ORDER — INSULIN ASPART 100 UNIT/ML ~~LOC~~ SOLN
0.0000 [IU] | Freq: Three times a day (TID) | SUBCUTANEOUS | Status: DC
  Administered 2012-04-12: 5 [IU] via SUBCUTANEOUS
  Administered 2012-04-13 (×2): 3 [IU] via SUBCUTANEOUS

## 2012-04-12 MED ORDER — CARVEDILOL 6.25 MG PO TABS
6.2500 mg | ORAL_TABLET | Freq: Two times a day (BID) | ORAL | Status: DC
Start: 2012-04-12 — End: 2012-04-13
  Administered 2012-04-12 – 2012-04-13 (×2): 6.25 mg via ORAL
  Filled 2012-04-12 (×4): qty 1

## 2012-04-12 MED ORDER — INSULIN ASPART 100 UNIT/ML ~~LOC~~ SOLN
0.0000 [IU] | Freq: Every day | SUBCUTANEOUS | Status: DC
  Administered 2012-04-12: 22:00:00 2 [IU] via SUBCUTANEOUS

## 2012-04-12 MED FILL — Dextrose Inj 5%: INTRAVENOUS | Qty: 50 | Status: AC

## 2012-04-12 NOTE — Progress Notes (Signed)
CARDIAC REHAB PHASE I   PRE:  Rate/Rhythm: 85 SR    BP: sitting 100/81    SaO2: 100 RA  MODE:  Ambulation: 280 ft   POST:  Rate/Rhythm: 111 ST    BP: sitting 138/60     SaO2: 96 RA  Pt tolerated fairly well. Slight SOB, which she denies. Slightly unsteady with assist x1. Encouraged pt to use a cane, which she has at home. Pt is reluctant to do this. Also feel pt would benefit from HHPT and possibly HHRN at d/c. Her granddaughter was present during our education but it seems pts understanding of how to care for herself is lacking. She has been noncompliant with her meds in the past per granddaughter. All ed discussed including CHF. Pt is interested in [facility] if she can get transportation. Requests her [facility] be sent to [facility]. Encouraged more walking today.  [id number]  Harriet Masson CES, ACSM

## 2012-04-12 NOTE — Progress Notes (Signed)
Introduced self to pt and explained sheath removal procedure. Pt stated understanding. VSS, Lt groin Level 0. Sheath removed and manual pressure held x25 mins. Post removal instructions given, Pressure dressing placed over site.  Groin stable post removal, Level 0. Pt tolerated well, VSS throught out. Unit RN will continue to monitor closely.

## 2012-04-12 NOTE — Care Management Note (Addendum)
    Page 1 of 1   04/13/2012     1:57:45 PM   CARE MANAGEMENT NOTE 04/13/2012  Patient:  [name], [name]   Account Number:  [id number]  Date Initiated:  04/12/2012  Documentation initiated by:  Fransico Michael  Subjective/Objective Assessment:   admitted on 04/07/12 with c/o chf     Action/Plan:   prior to admission, patient lived at home with spouse.   Anticipated DC Date:  04/12/2012   Anticipated DC Plan:  HOME/SELF CARE      DC Planning Services  CM consult      [facility] Choice  HOME HEALTH   Choice offered to / List presented to:  C-1 Patient           Status of service:  Completed, signed off Medicare Important Message given?   (If response is "NO", the following Medicare IM given date fields will be blank) Date Medicare IM given:   Date Additional Medicare IM given:    Discharge Disposition:  HOME W HOME HEALTH SERVICES  Per UR Regulation:  Reviewed for med. necessity/level of care/duration of stay  If discussed at Long Length of Stay Meetings, dates discussed:    Comments:  04/13/12-1350-J.Aowyn Rozeboom,RN,BSN [phone]      Per discussions with patient and daughter this am. Patient would like to have home health services. [facility] [place] [phone] notified of new referral. Demographics, order, and discharge summary faxed per request to [phone]. Patient has been discharged home.  04/12/12-1022-J.Jessi Jessop,Rn,BSN [phone]     In to speak to patient and daughter regarding recommendation for Home health services and cane by cardiac rehab. Patient unsure of desire for home health. Information given to patient and placed on follow up section for home health services available to her in [place]. Patient refused cane.Anticipated discharge to home today.

## 2012-04-12 NOTE — Consult Note (Signed)
ELECTROPHYSIOLOGY CONSULT NOTE  Patient ID: [name] MRN: [id number], DOB/AGE: [birth year]   Admit date: 04/07/2012 Date of Consult: 04/12/2012  Primary Physician: [name], MD Primary Cardiologist: Vear Clock, MD in [place], [place] Reason for Consultation: Ischemic cardiomyopathy, possible AICD  History of Present Illness [name] is a 77 year old with coronary artery disease, status post coronary artery bypass grafting in 2009 (performed in [place], [place] and the patient is still followed in [place] by Dr. Vear Clock). Since her CABG, she has not had a repeat cardiac catheterization nor has she had any hospitalizations for heart failure. According to records from Dr. Vear Clock, she was last seen in April 2012 and she appeared to be doing well. On 04/07/2012, she called her PCP who instructed her to go to the ED for evaluation of SOB and LE swelling. [name] reports increased shortness of breath and new LE swelling over the last week or so. Over the last 2-3 days, she has noticed DOE, even with minimal exertion. She has also had orthopnea. On admission, she was found to be in heart failure. She denies CP, palpitations, dizziness or syncope. Her 12-lead ECG shows sinus rhythm with LBBB. It is unclear if this is new or old. Her cardiac enzymes have been negative. However, an echocardiogram revealed severe LV dysfunction, EF 25%. Therefore, she underwent further evaluation with a right and left heart catheterization. She was found to have obstructive CAD involving the RCA which was successfully stented yesterday. She has patent grafts to the LAD, OM, and assumed ramus branch. She has now been initiated on appropriate medical therapy for LV dysfunction.  Past Medical History Past Medical History  Diagnosis Date  . Coronary artery disease   . Hypertension   . High cholesterol   . Type II diabetes mellitus   . Alzheimer's dementia   . CHF (congestive heart failure)     Past Surgical History Past  Surgical History  Procedure Date  . Coronary artery bypass graft ~ 2009    CABG X1  . Goiter removed   . Cardiac catheterization 04/07/2012     Allergies/Intolerances No Known Allergies  Inpatient Medications . aspirin  81 mg Oral Daily  . atorvastatin  40 mg Oral q1800  . carvedilol  6.25 mg Oral BID WC  . clopidogrel  75 mg Oral Q breakfast  . docusate sodium  100 mg Oral BID  . ferrous gluconate  324 mg Oral Q breakfast  . furosemide  40 mg Oral BID  . glyburide  5 mg Oral Q breakfast  . insulin aspart  0-15 Units Subcutaneous TID WC  . insulin aspart  0-5 Units Subcutaneous QHS  . levothyroxine  25 mcg Oral QAC breakfast  . lisinopril  10 mg Oral Daily  . spironolactone  25 mg Oral Daily   . sodium chloride 125 mL/hr at 04/11/12 1800   Family History History reviewed. No pertinent family history.   Social History Social History  . Marital Status: Married   Social History Main Topics  . Smoking status: Never Smoker   . Smokeless tobacco: Never Used  . Alcohol Use: No  . Drug Use: No   Review of Systems General: No chills, fever, night sweats or weight changes  Cardiovascular: No chest pain, dyspnea on exertion, edema, orthopnea, palpitations, paroxysmal nocturnal dyspnea Dermatological: No rash, lesions or masses Respiratory: No cough, dyspnea Urologic: No hematuria, dysuria Abdominal: No nausea, vomiting, diarrhea, bright red blood per rectum, melena, or hematemesis Neurologic: No visual changes, weakness, changes in  mental status All other systems reviewed and are otherwise negative except as noted above.  Physical Exam Blood pressure 145/113, pulse 77, temperature 98.1 F (36.7 C), temperature source Oral, resp. rate 15, height 5\' 2"  (1.575 m), weight 172 lb 13.5 oz (78.4 kg), SpO2 97.00%.  General: Well developed, well appearing 77 year old female in no acute distress. HEENT: Normocephalic, atraumatic. EOMs intact. Sclera nonicteric. Oropharynx  clear.  Neck: Supple without bruits. No JVD. Lungs: Respirations regular and unlabored, CTA bilaterally. No wheezes, rales or rhonchi. Heart: RRR. S1, S2 present. No murmurs, rub, S3 or S4. Abdomen: Soft, non-tender, non-distended. BS present x 4 quadrants. No hepatosplenomegaly.  Extremities: No clubbing, cyanosis or edema. DP/PT/Radials 2+ and equal bilaterally. Psych: Normal affect. Neuro: Alert and oriented X 3. Moves all extremities spontaneously. Musculoskeletal: No kyphosis. Skin: Intact. Warm and dry. No rashes or petechiae in exposed areas.   Labs No results found for this basename: CKTOTAL:4,CKMB:4,TROPONINI:4 in the last 72 hours Lab Results  Component Value Date   WBC 4.7 04/12/2012   HGB 12.1 04/12/2012   HCT 36.4 04/12/2012   MCV 87.9 04/12/2012   PLT 224 04/12/2012    Lab 04/12/12 0540  NA 142  K 3.6  CL 103  CO2 29  BUN 12  CREATININE 0.74  CALCIUM 9.1  PROT --  BILITOT --  ALKPHOS --  ALT --  AST --  GLUCOSE 192*   No components found with this basename: MAGNESIUM No components found with this basename: POCBNP:3   Basename 04/11/12 0530  INR 1.03    Radiology/Studies No results found in >48 hours  Right and left cardiac catheterization 04/07/2012 Hemodynamics:  RA: 4/4  RV: 30/3  PCWP: 9/4  PA: 29/11  Cardiac Output  Thermodilution: 3.39 Index of 2.1  Fick : 3.78 Index 1.88  Arterial Sat: 94%  PA Sat: 60%.  LV pressure: 151/8  Aortic pressure: 152/67  Angiography  Left Main: smooth and normal  Left anterior Descending: Moderate disease in the proximal segment between 50-70%. The LAD gives off a small diagonal and then has a lucent 70% stenosis. Competitive filling can be seen coming from the IMA graft.  Ramus Intermediate : The ramus intermediate is a moderate vessel and is occluded at it's mid point.  Left Circumflex: The LCx is a small - moderate sized vessel . The OM branches are occluded.  Right Coronary Artery: Large and dominant. The  entire RCA is moderately diffusely diseased. There is a tortous 40% stenosis in the proximal vessel. The mid RCA has a tight 95% stenosis. There is brisk flow through this stenosis. The distal RCA has a 30-40% stenosis . The PDA has a 70% stenosis in the proximal aspect of the branch. The PLSA has no significant disease.  SVG to Ramus : The more superior graft is assumed to be to the Ramus branch. Nice graft. No stenosis in the graft or native vessel ( assumed to be Ramus)  SVG to Obtuse Marginal : ( the inferior graft) Normal graft. Normal native vessel.  LIMA to LAD: Normal graft. The anastomosis is normal. There is competitive flow from the native LAD.  LV Gram: done with hand injection of 7 cc contrast. Severe LV dysfunction. EF 25-30%  Complications: No apparent complications  Patient did tolerate procedure well.  Contrast used: 92 cc  Conclusions:  1. Severe native CAD . She has patent grafts to the LAD, OM, and assumed ramus branch. The native RCA does not have any grafts  and has a tight 95% stenosis in the mid segment. The patient was not consented for PCI prior to arriving in the cath lab and she had already been given valium. We will treat with heparin overnight and plan PCI on Tuesday. In addition, there was some concern about her renal function and limiting contrast. Staging the PCI will allow her to clear all of the contrast given and minimize risk of contrast induced nephropathy.  2. Severe LV dysfunction: She has an EF of 25-30 %. Her filling pressures are low / normal. PTCA and stenting of the RCA performed 04/11/2012     12-lead ECG shows sinus rhythm with LBBB, QRS 152 ms. Telemetry normal sinus rhythm; no arrhythmias  Assessment and Plan 1. New LV dysfunction with acute systolic HF, probable ischemic CM - Ms. Pankonin is s/p PCI this admission and is now on appropriate medical therapy for LV dysfunction. She is not a candidate for AICD implantation at this time. We recommend  continuing medical therapy and repeating her echocardiogram in 3 months to reassess her LV function. If remains <35%, will consider ICD implantation for primary prevention of SCD (? need for CRT given LBBB but will determine this in follow-up pending HF status and LVEF).  2. LBBB 3. CAD - per Dr. Riley Kill  Dr. Graciela Husbands to see and make further recommendations. Limmie Patricia, PA-C 04/12/2012, 1:45 PM  Agree with above  Sherryl Manges, MD 04/13/2012 8:34 AM

## 2012-04-12 NOTE — Progress Notes (Signed)
Patient Name: [name] Date of Encounter: 04/12/2012   Principal Problem:  *Acute systolic CHF (congestive heart failure) Active Problems:  Atherosclerotic heart disease of native coronary artery with unstable angina pectoris  Hypertension  High cholesterol  Type II diabetes mellitus   SUBJECTIVE: No Chest pain, no SOB. C/O being sleepy. No interest in HHRN/PT/OT.   OBJECTIVE Filed Vitals:   04/11/12 2223 04/12/12 0000 04/12/12 0400 04/12/12 0748  BP: 142/68 144/65 150/76 142/69  Pulse: 78  79 69  Temp:  98.3 F (36.8 C) 98.2 F (36.8 C) 98.2 F (36.8 C)  TempSrc:  Oral Oral Oral  Resp:  16 15 13   Height:      Weight:   172 lb 13.5 oz (78.4 kg)   SpO2:  100% 97% 97%    Intake/Output Summary (Last 24 hours) at 04/12/12 1139 Last data filed at 04/12/12 0100  Gross per 24 hour  Intake    875 ml  Output    200 ml  Net    675 ml   Filed Weights   04/07/12 1403 04/12/12 0400  Weight: 173 lb 12.8 oz (78.835 kg) 172 lb 13.5 oz (78.4 kg)   PHYSICAL EXAM General: Well developed, well nourished female, in no acute distress. Head: Normocephalic, atraumatic.  Neck: Supple without bruits, JVD slightly elevated, about 8 cm. Lungs:  Resp regular and unlabored, rales bases, some crackles. Heart: RRR, S1, S2, no S3, S4, 2/6 systolic murmur. Abdomen: Soft, non-tender, non-distended, BS + x 4.  Extremities: No clubbing, cyanosis, trace edema.  Neuro: Alert and oriented X 3. Moves all extremities spontaneously. Psych: Flat affect.  LABS: CBC: Basename 04/12/12 0540  WBC 4.7  NEUTROABS --  HGB 12.1  HCT 36.4  MCV 87.9  PLT 224   INR: Basename 04/11/12 0530  INR 1.03   Basic Metabolic Panel: Basename 04/12/12 0540 04/11/12 0620  NA 142 140  K 3.6 3.7  CL 103 98  CO2 29 31  GLUCOSE 192* 176*  BUN 12 14  CREATININE 0.74 0.80  CALCIUM 9.1 9.3  MG -- --  PHOS -- --   TELE:   SR     ECG: 12-Apr-2012 05:36:10   Normal sinus rhythm Left bundle branch  block Abnormal ECG Vent. rate 72 BPM PR interval 168 Kelly QRS duration 154 Kelly QT/QTc 480/525 Kelly P-R-T axes 68 36 242  Radiology/Studies: At [facility] - X-ray: Reveals vascular congestion with cardiomegaly and evidence of prior bypass surgery with sternal wires.  Current Medications:  . aspirin  81 mg Oral Daily  . atorvastatin  40 mg Oral q1800  . bivalirudin      . carvedilol  3.125 mg Oral BID WC  . clopidogrel  75 mg Oral Q breakfast  . docusate sodium  100 mg Oral BID  . ferrous gluconate  324 mg Oral Q breakfast  . furosemide  40 mg Oral BID  . glyBURIDE  5 mg Oral Q breakfast  . heparin      . levothyroxine  25 mcg Oral QAC breakfast  . lidocaine      . lisinopril  10 mg Oral Daily  . midazolam      . nitroGLYCERIN      . nitroGLYCERIN      . spironolactone  25 mg Oral Daily      . sodium chloride 125 mL/hr at 04/11/12 1800  . DISCONTD: sodium chloride 1 mL/kg/hr (04/11/12 0603)    ASSESSMENT AND PLAN: Principal Problem:  *  Acute systolic CHF (congestive heart failure) - s/p diuresis, now back on PO Lasix, maintaining sats on room air, continue to follow I/O, weights  Active Problems:  Atherosclerotic heart disease of native coronary artery with unstable angina pectoris - s/p PTCA and stenting of the RCA with a 2.25 by 28mm Abbott Expedition stent. P2Y12 test OK. Bypass grafts patent.   Hypertension - PTA HCTZ 25 qd. Now on Lasix 40 bid, Aldactone, lisinopril and Coreg 3.125 BID, will increase to 6.25 mg BID for better BP control.   High cholesterol - Lipitor 40 mg is new.   Type II diabetes mellitus  - will add SSI and ck A1c, restart metformin 9/6, on home dose glyburide.  Balance problems - pt able to amb with assistance, denies falls, refuses PT/OT as O.P.  ICM - EF 20-25% by bedside echo at [facility], 25-30% at cath. MD discussed with family and pt is to be followed in [place]. She is on BB/ACE/diuretic.  Plan - d/c when medically stable, probably in  am.  Signed, Theodore Demark , PA-C 11:39 AM 04/12/2012  Patient is stable and pain free following PCI.  No new symptoms.  She needs EP evaluation for eventual device possibility, but will need to wait with revascularization for reassessment of LV function.  Will reduce furosemide to 40 mg daily, and she will follow up in [place].  Plan DC tomorrow.  I concur with the assessment of [name].

## 2012-04-13 ENCOUNTER — Encounter (HOSPITAL_COMMUNITY): Payer: Self-pay | Admitting: Nurse Practitioner

## 2012-04-13 DIAGNOSIS — I2510 Atherosclerotic heart disease of native coronary artery without angina pectoris: Secondary | ICD-10-CM

## 2012-04-13 LAB — BASIC METABOLIC PANEL
BUN: 14 mg/dL (ref 6–23)
Chloride: 100 mEq/L (ref 96–112)
Creatinine, Ser: 0.87 mg/dL (ref 0.50–1.10)
GFR calc Af Amer: 74 mL/min — ABNORMAL LOW (ref >90)
GFR calc non Af Amer: 64 mL/min — ABNORMAL LOW (ref >90)
Potassium: 3.7 mEq/L (ref 3.5–5.1)

## 2012-04-13 LAB — HEMOGLOBIN A1C: Hgb A1c MFr Bld: 7.7 % — ABNORMAL HIGH (ref <5.7)

## 2012-04-13 MED ORDER — ATORVASTATIN CALCIUM 40 MG PO TABS: 40.0000 mg | ORAL_TABLET | Freq: Every day | ORAL | Status: DC

## 2012-04-13 MED ORDER — CLOPIDOGREL BISULFATE 75 MG PO TABS: 75.0000 mg | ORAL_TABLET | Freq: Every day | ORAL | Status: AC

## 2012-04-13 MED ORDER — NITROGLYCERIN 0.4 MG SL SUBL: 0.4000 mg | SUBLINGUAL_TABLET | SUBLINGUAL | Status: DC | PRN

## 2012-04-13 MED ORDER — LISINOPRIL 10 MG PO TABS: 10.0000 mg | ORAL_TABLET | Freq: Every day | ORAL | Status: DC

## 2012-04-13 MED ORDER — METFORMIN HCL 1000 MG PO TABS: 1000.0000 mg | ORAL_TABLET | Freq: Every day | ORAL | Status: DC

## 2012-04-13 MED ORDER — ASPIRIN EC 81 MG PO TBEC: 81.0000 mg | DELAYED_RELEASE_TABLET | Freq: Every day | ORAL | Status: AC

## 2012-04-13 MED ORDER — FERROUS GLUCONATE 324 (38 FE) MG PO TABS: 324.0000 mg | ORAL_TABLET | Freq: Every day | ORAL | Status: DC

## 2012-04-13 MED ORDER — FUROSEMIDE 40 MG PO TABS: 40.0000 mg | ORAL_TABLET | Freq: Every day | ORAL | Status: DC

## 2012-04-13 MED ORDER — SPIRONOLACTONE 25 MG PO TABS: 25.0000 mg | ORAL_TABLET | Freq: Every day | ORAL | Status: DC

## 2012-04-13 MED ORDER — CARVEDILOL 6.25 MG PO TABS: 6.2500 mg | ORAL_TABLET | Freq: Two times a day (BID) | ORAL | Status: DC

## 2012-04-13 MED ORDER — LEVOTHYROXINE SODIUM 25 MCG PO TABS: 25.0000 ug | ORAL_TABLET | Freq: Every day | ORAL | Status: DC

## 2012-04-13 NOTE — Progress Notes (Signed)
CARDIAC REHAB PHASE I   PRE:  Rate/Rhythm: 81SR  BP:  Supine:   Sitting: 113/54  Standing:    SaO2:   MODE:  Ambulation: 332 ft   POST:  Rate/Rhythem: 114 ST  BP:  Supine:   Sitting: 146/73  Standing:     SaO2:  [id number] Assisted X 1 to ambulate. Gait a little unsteady. Pt got off balance X 1, but was able to correct it herself. VS stable. Pt without c/o with walking. To recliner after walk with callight in reach.  Beatrix Fetters

## 2012-04-13 NOTE — Discharge Summary (Signed)
Patient ID: [name],  MRN: [id number], DOB/AGE: March 04, 1935 76 y.o.  Admit date: 04/07/2012 Discharge date: 04/13/2012  Primary Care Provider: [facility] Primary Cardiologist: J. Hochrein, MD in our [place] office  Discharge Diagnoses Principal Problem:  *Acute systolic CHF (congestive heart failure)  **EF 25-30% by LV gram this admission.  **D/C weight 177 lbs. Active Problems:  CAD (coronary artery disease)  **s/p prior CABG x 3  **s/p PCI native RCA with 2 DES this admission.  Hypertension  High cholesterol  Type II diabetes mellitus  Allergies No Known Allergies  Procedures  Cardiac Catheterization 04/07/2012  Hemodynamics:    RA: 4/4 RV: 30/3 PCWP: 9/4 PA:  29/11  Cardiac Output              Thermodilution: 3.39            Index of 2.1             Fick : 3.78                               Index 1.88  Arterial Sat: 94% PA Sat: 60%.  LV pressure: 151/8 Aortic pressure: 152/67  Angiography    Left Main: smooth and normal Left anterior Descending:   Moderate disease in the proximal segment between 50-70%.  The LAD gives off a small diagonal and then has a lucent 70% stenosis.  Competitive filling can be seen coming from the IMA graft. Ramus Intermediate :  The ramus intermediate is a moderate vessel and is occluded at it's mid point.    Left Circumflex: The LCx is a small - moderate sized vessel .  The OM branches are occluded. Right Coronary Artery:  Large and dominant.  The entire RCA is moderately diffusely diseased.  There is a tortous 40% stenosis in the proximal vessel.  The mid RCA has a tight 95% stenosis.  There is brisk flow through this stenosis.  The distal RCA has a 30-40% stenosis .  The PDA has a 70% stenosis in the proximal aspect of the branch.  The PLSA has no significant disease.  SVG to Ramus :  The more superior graft is assumed to be to the Ramus branch.   Nice graft.  No stenosis in the graft or native vessel ( assumed to be Ramus) SVG to  Obtuse Marginal :  ( the inferior graft)  Normal graft. Normal native vessel. LIMA to LAD:  Normal graft.  The anastomosis is normal.  There is competitive flow from the native LAD. LV Gram: done with hand injection of 7 cc contrast. Severe LV dysfunction.  EF 25-30% _____________  Percutaneous Coronary Intervention 04/11/2012  The lesion in the mid RCA was then stented with a 2.25 by 28mm Abbott Expedition stent along with a 2.5 x 8mm Expedition stent more proximally, overlapping the first stent.  History of Present Illness  76 year old female with prior history of coronary artery disease status post coronary artery bypass grafting in 2009. She was in her usual state of health until several days prior to admission when she began to experience progressive dyspnea and lower extremity edema.  She subsequently presented to [facility] in [facility] where she was noted to have significant volume overload on exam. Cardiac enzymes were negative. She underwent echocardiogram which showed EF of 20-25%. It was not known what prior LV function was. Patient was aggressively diuresis and there was concern that her new onset of  heart failure may be related to ischemia. While still at [facility], she underwent a rest redistribution thallium study to assess for myocardial viability in this showed uptake throughout the myocardium without significant defects. As a result, it was felt that she was a good candidate for diagnostic catheterization and revascularization if this was found to be appropriate. She was transferred to [facility] for further evaluation.  Hospital Course  Patient underwent diagnostic catheterization on August 30 revealing 3 of 3 patent grafts with a 95% stenosis in the mid right coronary artery. This was a nongrafted vessel. Her filling pressures were normal and it was not felt that she would require additional IV diuresis. It was felt that she would require percutaneous  intervention and she was monitored over the weekend without any recurrence of chest pain. Renal function remained stable following catheterization and on September 3, she was taken back to the cath lab where she underwent successful PCI and drug-eluting placement within the right coronary artery as outlined above. She tolerated this procedure well and post procedure has been ambulating without symptoms or limitations. Given low ejection fraction, she has been evaluated by electrophysiology who feel that she will require followup echo in 3 months for reevaluation of LV function following recent revascularization in order to determine her candidacy for AICD. She will be discharged home today in good condition.  Discharge Vitals Blood pressure 112/54, pulse 79, temperature 97.5 F (36.4 C), temperature source Oral, resp. rate 20, height 5\' 2"  (1.575 m), weight 177 lb 7.5 oz (80.5 kg), SpO2 97.00%.  Filed Weights   04/07/12 1403 04/12/12 0400 04/13/12 0015  Weight: 173 lb 12.8 oz (78.835 kg) 172 lb 13.5 oz (78.4 kg) 177 lb 7.5 oz (80.5 kg)   Labs  CBC  Basename 04/12/12 0540  WBC 4.7  NEUTROABS --  HGB 12.1  HCT 36.4  MCV 87.9  PLT 224   Basic Metabolic Panel  Basename 04/13/12 0700 04/12/12 0540  NA 138 142  K 3.7 3.6  CL 100 103  CO2 28 29  GLUCOSE 141* 192*  BUN 14 12  CREATININE 0.87 0.74  CALCIUM 9.2 9.1  MG -- --  PHOS -- --   Hemoglobin A1C  Basename 04/12/12 1358  HGBA1C 7.7*   Disposition  Pt is being discharged home today in good condition.  Follow-up Plans & Appointments  Follow-up Information    Follow up with [facility]. (If patient decides to use home health services, this is the agency that services her home area.)    Contact information:   [phone]      Follow up with Rollene Rotunda, MD on 05/09/2012. (11:15)    Contact information:   [facility] [address] [place] [phone]       Follow up with Sherryl Manges, MD  on 07/13/2012. (9:30 AM - echocardiogram @ 9:30 and f/u with Dr. Graciela Husbands @ 10:00 AM)    Contact information:   [address] [place] [address] [phone]       Follow up with Prescott Parma, PA on 04/26/2012. (2:00 PM)    Contact information:   [address], [address] [place] [address] [phone]         Discharge Medications  Medication List  As of 04/13/2012 11:38 AM   STOP taking these medications         hydrochlorothiazide 25 MG tablet  TAKE these medications         aspirin EC 81 MG tablet   Take 1 tablet (81 mg total) by mouth daily.      atorvastatin 40 MG tablet   Commonly known as: LIPITOR   Take 1 tablet (40 mg total) by mouth daily at 6 PM.      carvedilol 6.25 MG tablet   Commonly known as: COREG   Take 1 tablet (6.25 mg total) by mouth 2 (two) times daily with a meal.      clopidogrel 75 MG tablet   Commonly known as: PLAVIX   Take 1 tablet (75 mg total) by mouth daily with breakfast.      donepezil 5 MG tablet   Commonly known as: ARICEPT   Take 5 mg by mouth at bedtime.      ferrous gluconate 324 MG tablet   Commonly known as: FERGON   Take 1 tablet (324 mg total) by mouth daily with breakfast.      furosemide 40 MG tablet   Commonly known as: LASIX   Take 1 tablet (40 mg total) by mouth daily.      glyBURIDE 5 MG tablet   Commonly known as: DIABETA   Take 5 mg by mouth.      levothyroxine 25 MCG tablet   Commonly known as: SYNTHROID, LEVOTHROID   Take 1 tablet (25 mcg total) by mouth daily before breakfast.      lisinopril 10 MG tablet   Commonly known as: PRINIVIL,ZESTRIL   Take 1 tablet (10 mg total) by mouth daily.      memantine 5 MG tablet   Commonly known as: NAMENDA   Take 5 mg by mouth 2 (two) times daily.      metFORMIN 1000 MG tablet   Commonly known as: GLUCOPHAGE   Take 1 tablet (1,000 mg total) by mouth daily with breakfast. **RESUME ON 04/14/2012**      nitroGLYCERIN  0.4 MG SL tablet   Commonly known as: NITROSTAT   Place 1 tablet (0.4 mg total) under the tongue every 5 (five) minutes x 3 doses as needed for chest pain.      spironolactone 25 MG tablet   Commonly known as: ALDACTONE   Take 1 tablet (25 mg total) by mouth daily.           Outstanding Labs/Studies  Follow-up echo in 3 months.  Duration of Discharge Encounter   Greater than 30 minutes including physician time.  Signed, Nicolasa Ducking NP 04/13/2012, 11:38 AM

## 2012-04-13 NOTE — Progress Notes (Signed)
   Patient Name: [name]      SUBJECTIVE:no sob or cheast pain  Past Medical History  Diagnosis Date  . Coronary artery disease   . Hypertension   . High cholesterol   . Shortness of breath 04/10/2012    "laying down"  . Type II diabetes mellitus   . Alzheimer's dementia   . CHF (congestive heart failure)     PHYSICAL EXAM Filed Vitals:   04/12/12 1943 04/13/12 0015 04/13/12 0525 04/13/12 0801  BP: 129/74 129/61 118/46 112/54  Pulse: 85 77 61 79  Temp: 98 F (36.7 C) 98 F (36.7 C) 97.5 F (36.4 C) 97.5 F (36.4 C)  TempSrc: Oral Oral Oral Oral  Resp: 18 21 17 20   Height:      Weight:  177 lb 7.5 oz (80.5 kg)    SpO2: 98% 99% 97% 97%   Well developed and nourished in no acute distress HENT normal Neck supple with JVP-flat Clear Regular rate and rhythm, no murmurs or gallops Abd-soft with active BS No Clubbing cyanosis edema Skin-warm and dry A & Oriented  Grossly normal sensory and motor function   TELEMETRY: Reviewed telemetry pt in [facility]  No intake or output data in the 24 hours ending 04/13/12 0835  LABS: Basic Metabolic Panel:  Lab 04/13/12 5284 04/12/12 0540 04/11/12 0620 04/10/12 0612 04/09/12 0500 04/07/12 1531  NA 138 142 140 141 140 139  K 3.7 3.6 3.7 3.5 3.3* 3.7  CL 100 103 98 101 99 95*  CO2 28 29 31 31  32 33*  GLUCOSE 141* 192* 176* 179* 165* 113*  BUN 14 12 14 13 18 19   CREATININE 0.87 0.74 0.80 0.76 0.87 0.91  CALCIUM 9.2 9.1 -- -- -- --  MG -- -- -- -- -- --  PHOS -- -- -- -- -- --   Cardiac Enzymes: No results found for this basename: CKTOTAL:3,CKMB:3,CKMBINDEX:3,TROPONINI:3 in the last 72 hours CBC:  Lab 04/12/12 0540 04/07/12 1531  WBC 4.7 4.6  NEUTROABS -- --  HGB 12.1 13.7  HCT 36.4 41.2  MCV 87.9 88.6  PLT 224 254   PROTIME:  Basename 04/11/12 0530  LABPROT 13.7  INR 1.03    D-Dimer:   ASSESSMENT AND PLAN:  Patient Active Hospital Problem List: Acute systolic CHF (congestive heart failure) (04/12/2012)   Hypertension ()     Type II diabetes mellitus ()   Atherosclerotic heart disease of native coronary artery with unstable angina pectoris (04/12/2012)  LBBB  Discharge on  Guideline directed medical therapy  will need general cards followup in [place] 4 weeks with bmet Repeat echo 3 month and f/u with me in [place] after taht or can have echo and visit the same day     Signed, Sherryl Manges MD  04/13/2012

## 2012-04-26 ENCOUNTER — Ambulatory Visit: Payer: Medicare (Managed Care) | Admitting: Physician Assistant

## 2012-05-05 ENCOUNTER — Telehealth: Payer: Self-pay | Admitting: *Deleted

## 2012-05-05 NOTE — Telephone Encounter (Signed)
Received referral on 9/5 for physical therapy - they have had no luck getting in touch with her.  Have made multiple attempts to contact her - always no answer or busy.    Has eph scheduled for 10/7 with Gene.

## 2012-05-09 ENCOUNTER — Encounter: Payer: Medicare (Managed Care) | Admitting: Cardiology

## 2012-05-15 ENCOUNTER — Encounter: Payer: Medicare (Managed Care) | Admitting: Physician Assistant

## 2012-07-12 ENCOUNTER — Other Ambulatory Visit (HOSPITAL_COMMUNITY): Payer: Self-pay | Admitting: Radiology

## 2012-07-12 DIAGNOSIS — I2510 Atherosclerotic heart disease of native coronary artery without angina pectoris: Secondary | ICD-10-CM

## 2012-07-12 NOTE — Progress Notes (Deleted)
Echocardiogram performed.  

## 2012-07-13 ENCOUNTER — Encounter: Payer: Medicare (Managed Care) | Admitting: Internal Medicine

## 2012-07-13 ENCOUNTER — Other Ambulatory Visit (HOSPITAL_COMMUNITY): Payer: Medicare (Managed Care)

## 2012-08-16 ENCOUNTER — Other Ambulatory Visit (HOSPITAL_COMMUNITY): Payer: Self-pay | Admitting: Nurse Practitioner

## 2012-09-07 ENCOUNTER — Ambulatory Visit: Payer: PRIVATE HEALTH INSURANCE | Admitting: Internal Medicine

## 2012-09-07 ENCOUNTER — Other Ambulatory Visit (HOSPITAL_COMMUNITY): Payer: PRIVATE HEALTH INSURANCE

## 2012-10-11 ENCOUNTER — Ambulatory Visit: Payer: Self-pay | Admitting: Internal Medicine

## 2012-10-11 ENCOUNTER — Other Ambulatory Visit (HOSPITAL_COMMUNITY): Payer: Self-pay

## 2012-11-24 ENCOUNTER — Encounter (INDEPENDENT_AMBULATORY_CARE_PROVIDER_SITE_OTHER): Payer: Medicare PPO | Admitting: Internal Medicine

## 2012-11-24 ENCOUNTER — Other Ambulatory Visit (HOSPITAL_COMMUNITY): Payer: Self-pay

## 2012-11-24 NOTE — Progress Notes (Signed)
d 

## 2012-11-28 ENCOUNTER — Encounter: Payer: Self-pay | Admitting: Internal Medicine

## 2012-11-29 ENCOUNTER — Other Ambulatory Visit (HOSPITAL_COMMUNITY): Payer: Self-pay | Admitting: Nurse Practitioner

## 2014-07-18 ENCOUNTER — Encounter (HOSPITAL_COMMUNITY): Payer: Self-pay | Admitting: Cardiovascular Disease

## 2014-10-30 ENCOUNTER — Inpatient Hospital Stay (HOSPITAL_COMMUNITY)
Admission: EM | Admit: 2014-10-30 | Discharge: 2014-11-06 | DRG: 226 | Disposition: A | Discharge status: Home Health | Payer: Medicare FFS | Source: Other Acute Inpatient Hospital | Attending: Cardiology | Admitting: Cardiology

## 2014-10-30 DIAGNOSIS — J9600 Acute respiratory failure, unspecified whether with hypoxia or hypercapnia: Secondary | ICD-10-CM | POA: Diagnosis not present

## 2014-10-30 DIAGNOSIS — Z955 Presence of coronary angioplasty implant and graft: Secondary | ICD-10-CM | POA: Diagnosis not present

## 2014-10-30 DIAGNOSIS — R531 Weakness: Secondary | ICD-10-CM

## 2014-10-30 DIAGNOSIS — I472 Ventricular tachycardia: Secondary | ICD-10-CM | POA: Diagnosis present

## 2014-10-30 DIAGNOSIS — I5023 Acute on chronic systolic (congestive) heart failure: Secondary | ICD-10-CM | POA: Diagnosis present

## 2014-10-30 DIAGNOSIS — I5021 Acute systolic (congestive) heart failure: Secondary | ICD-10-CM

## 2014-10-30 DIAGNOSIS — I255 Ischemic cardiomyopathy: Secondary | ICD-10-CM | POA: Diagnosis present

## 2014-10-30 DIAGNOSIS — I442 Atrioventricular block, complete: Secondary | ICD-10-CM | POA: Diagnosis present

## 2014-10-30 DIAGNOSIS — E7800 Pure hypercholesterolemia, unspecified: Secondary | ICD-10-CM | POA: Diagnosis present

## 2014-10-30 DIAGNOSIS — F0280 Dementia in other diseases classified elsewhere without behavioral disturbance: Secondary | ICD-10-CM | POA: Diagnosis present

## 2014-10-30 DIAGNOSIS — E876 Hypokalemia: Secondary | ICD-10-CM | POA: Diagnosis present

## 2014-10-30 DIAGNOSIS — I10 Essential (primary) hypertension: Secondary | ICD-10-CM | POA: Diagnosis present

## 2014-10-30 DIAGNOSIS — G309 Alzheimer's disease, unspecified: Secondary | ICD-10-CM | POA: Diagnosis present

## 2014-10-30 DIAGNOSIS — I509 Heart failure, unspecified: Secondary | ICD-10-CM | POA: Diagnosis not present

## 2014-10-30 DIAGNOSIS — I441 Atrioventricular block, second degree: Secondary | ICD-10-CM | POA: Diagnosis present

## 2014-10-30 DIAGNOSIS — E119 Type 2 diabetes mellitus without complications: Secondary | ICD-10-CM | POA: Diagnosis present

## 2014-10-30 DIAGNOSIS — Z95810 Presence of automatic (implantable) cardiac defibrillator: Secondary | ICD-10-CM | POA: Diagnosis not present

## 2014-10-30 DIAGNOSIS — I4721 Torsades de pointes: Secondary | ICD-10-CM

## 2014-10-30 DIAGNOSIS — R0602 Shortness of breath: Secondary | ICD-10-CM | POA: Diagnosis present

## 2014-10-30 DIAGNOSIS — I2510 Atherosclerotic heart disease of native coronary artery without angina pectoris: Secondary | ICD-10-CM | POA: Diagnosis present

## 2014-10-30 DIAGNOSIS — Z951 Presence of aortocoronary bypass graft: Secondary | ICD-10-CM | POA: Diagnosis not present

## 2014-10-30 DIAGNOSIS — Z4502 Encounter for adjustment and management of automatic implantable cardiac defibrillator: Secondary | ICD-10-CM | POA: Diagnosis not present

## 2014-10-30 DIAGNOSIS — Z959 Presence of cardiac and vascular implant and graft, unspecified: Secondary | ICD-10-CM

## 2014-10-30 HISTORY — DX: Presence of automatic (implantable) cardiac defibrillator: Z95.810

## 2014-10-31 ENCOUNTER — Inpatient Hospital Stay (HOSPITAL_COMMUNITY): Payer: Medicare FFS

## 2014-10-31 ENCOUNTER — Other Ambulatory Visit: Payer: Self-pay

## 2014-10-31 DIAGNOSIS — I442 Atrioventricular block, complete: Secondary | ICD-10-CM

## 2014-10-31 DIAGNOSIS — I4721 Torsades de pointes: Secondary | ICD-10-CM

## 2014-10-31 DIAGNOSIS — Z0389 Encounter for observation for other suspected diseases and conditions ruled out: Secondary | ICD-10-CM

## 2014-10-31 DIAGNOSIS — I5021 Acute systolic (congestive) heart failure: Secondary | ICD-10-CM

## 2014-10-31 DIAGNOSIS — I472 Ventricular tachycardia: Secondary | ICD-10-CM

## 2014-10-31 DIAGNOSIS — I10 Essential (primary) hypertension: Secondary | ICD-10-CM

## 2014-10-31 DIAGNOSIS — I441 Atrioventricular block, second degree: Secondary | ICD-10-CM | POA: Diagnosis present

## 2014-10-31 LAB — BASIC METABOLIC PANEL WITH GFR
Anion gap: 12 (ref 5–15)
BUN: 9 mg/dL (ref 6–23)
CO2: 28 mmol/L (ref 19–32)
Calcium: 7.6 mg/dL — ABNORMAL LOW (ref 8.4–10.5)
Chloride: 94 mmol/L — ABNORMAL LOW (ref 96–112)
Creatinine, Ser: 0.98 mg/dL (ref 0.50–1.10)
GFR calc Af Amer: 63 mL/min — ABNORMAL LOW
GFR calc non Af Amer: 54 mL/min — ABNORMAL LOW
Glucose, Bld: 351 mg/dL — ABNORMAL HIGH (ref 70–99)
Potassium: 3.1 mmol/L — ABNORMAL LOW (ref 3.5–5.1)
Sodium: 134 mmol/L — ABNORMAL LOW (ref 135–145)

## 2014-10-31 LAB — CBC
HCT: 35.3 % — ABNORMAL LOW (ref 36.0–46.0)
Hemoglobin: 11.5 g/dL — ABNORMAL LOW (ref 12.0–15.0)
MCH: 28.9 pg (ref 26.0–34.0)
MCHC: 32.6 g/dL (ref 30.0–36.0)
MCV: 88.7 fL (ref 78.0–100.0)
PLATELETS: 188 10*3/uL (ref 150–400)
RBC: 3.98 MIL/uL (ref 3.87–5.11)
RDW: 13.7 % (ref 11.5–15.5)
WBC: 4.8 10*3/uL (ref 4.0–10.5)

## 2014-10-31 LAB — BASIC METABOLIC PANEL
Anion gap: 11 (ref 5–15)
Anion gap: 12 (ref 5–15)
BUN: 11 mg/dL (ref 6–23)
BUN: 12 mg/dL (ref 6–23)
CALCIUM: 7.6 mg/dL — AB (ref 8.4–10.5)
CO2: 27 mmol/L (ref 19–32)
CO2: 27 mmol/L (ref 19–32)
CREATININE: 0.92 mg/dL (ref 0.50–1.10)
CREATININE: 1.15 mg/dL — AB (ref 0.50–1.10)
Calcium: 7.5 mg/dL — ABNORMAL LOW (ref 8.4–10.5)
Chloride: 99 mmol/L (ref 96–112)
Chloride: 95 mmol/L — ABNORMAL LOW (ref 96–112)
GFR calc Af Amer: 52 mL/min — ABNORMAL LOW (ref >90)
GFR calc non Af Amer: 59 mL/min — ABNORMAL LOW (ref >90)
GFR, EST AFRICAN AMERICAN: 68 mL/min — AB (ref >90)
GFR, EST NON AFRICAN AMERICAN: 45 mL/min — AB (ref >90)
Glucose, Bld: 229 mg/dL — ABNORMAL HIGH (ref 70–99)
Glucose, Bld: 213 mg/dL — ABNORMAL HIGH (ref 70–99)
POTASSIUM: 3.3 mmol/L — AB (ref 3.5–5.1)
POTASSIUM: 3.5 mmol/L (ref 3.5–5.1)
Sodium: 137 mmol/L (ref 135–145)
Sodium: 134 mmol/L — ABNORMAL LOW (ref 135–145)

## 2014-10-31 LAB — GLUCOSE, CAPILLARY
GLUCOSE-CAPILLARY: 181 mg/dL — AB (ref 70–99)
Glucose-Capillary: 192 mg/dL — ABNORMAL HIGH (ref 70–99)
Glucose-Capillary: 189 mg/dL — ABNORMAL HIGH (ref 70–99)
Glucose-Capillary: 204 mg/dL — ABNORMAL HIGH (ref 70–99)

## 2014-10-31 LAB — MAGNESIUM: Magnesium: 1.5 mg/dL (ref 1.5–2.5)

## 2014-10-31 LAB — TROPONIN I
TROPONIN I: 0.15 ng/mL — AB (ref <0.031)
TROPONIN I: 0.19 ng/mL — AB (ref <0.031)

## 2014-10-31 LAB — MRSA PCR SCREENING: MRSA BY PCR: NEGATIVE

## 2014-10-31 MED ORDER — DONEPEZIL HCL 5 MG PO TABS
5.0000 mg | ORAL_TABLET | Freq: Every day | ORAL | Status: DC
  Filled 2014-10-31: qty 1

## 2014-10-31 MED ORDER — POTASSIUM CHLORIDE CRYS ER 20 MEQ PO TBCR
20.0000 meq | EXTENDED_RELEASE_TABLET | Freq: Every day | ORAL | Status: DC
  Administered 2014-10-31: 20 meq via ORAL
  Filled 2014-10-31: qty 1

## 2014-10-31 MED ORDER — INSULIN ASPART 100 UNIT/ML ~~LOC~~ SOLN: 0.0000 [IU] | Freq: Every day | SUBCUTANEOUS | Status: DC

## 2014-10-31 MED ORDER — CETYLPYRIDINIUM CHLORIDE 0.05 % MT LIQD
7.0000 mL | Freq: Two times a day (BID) | OROMUCOSAL | Status: DC
  Administered 2014-10-31 – 2014-11-06 (×13): 7 mL via OROMUCOSAL

## 2014-10-31 MED ORDER — LEVOTHYROXINE SODIUM 25 MCG PO TABS
25.0000 ug | ORAL_TABLET | Freq: Every day | ORAL | Status: DC
  Administered 2014-10-31 – 2014-11-06 (×7): 25 ug via ORAL
  Filled 2014-10-31 (×8): qty 1

## 2014-10-31 MED ORDER — AMIODARONE HCL IN DEXTROSE 360-4.14 MG/200ML-% IV SOLN
30.0000 mg/h | INTRAVENOUS | Status: DC
  Administered 2014-10-31: 60 mg/h via INTRAVENOUS
  Administered 2014-10-31 (×2): 30 mg/h via INTRAVENOUS
  Filled 2014-10-31 (×5): qty 200

## 2014-10-31 MED ORDER — HEPARIN SODIUM (PORCINE) 5000 UNIT/ML IJ SOLN
5000.0000 [IU] | Freq: Two times a day (BID) | INTRAMUSCULAR | Status: DC
  Administered 2014-11-01 – 2014-11-06 (×9): 5000 [IU] via SUBCUTANEOUS
  Filled 2014-10-31 (×12): qty 1

## 2014-10-31 MED ORDER — ACETAMINOPHEN 325 MG PO TABS: 650.0000 mg | ORAL_TABLET | ORAL | Status: DC | PRN

## 2014-10-31 MED ORDER — LISINOPRIL 20 MG PO TABS
20.0000 mg | ORAL_TABLET | Freq: Every day | ORAL | Status: DC
  Administered 2014-10-31 – 2014-11-06 (×7): 20 mg via ORAL
  Filled 2014-10-31 (×7): qty 1

## 2014-10-31 MED ORDER — INSULIN ASPART 100 UNIT/ML ~~LOC~~ SOLN
0.0000 [IU] | Freq: Three times a day (TID) | SUBCUTANEOUS | Status: DC
  Administered 2014-10-31 (×2): 3 [IU] via SUBCUTANEOUS
  Administered 2014-10-31: 5 [IU] via SUBCUTANEOUS
  Administered 2014-11-01: 3 [IU] via SUBCUTANEOUS
  Administered 2014-11-01 – 2014-11-02 (×2): 2 [IU] via SUBCUTANEOUS
  Administered 2014-11-02: 3 [IU] via SUBCUTANEOUS
  Administered 2014-11-02: 2 [IU] via SUBCUTANEOUS
  Administered 2014-11-03 (×2): 3 [IU] via SUBCUTANEOUS
  Administered 2014-11-05: 2 [IU] via SUBCUTANEOUS
  Administered 2014-11-05 – 2014-11-06 (×2): 3 [IU] via SUBCUTANEOUS

## 2014-10-31 MED ORDER — LISINOPRIL 10 MG PO TABS
10.0000 mg | ORAL_TABLET | Freq: Every day | ORAL | Status: DC
  Filled 2014-10-31: qty 1

## 2014-10-31 MED ORDER — MAGNESIUM SULFATE 4 GM/100ML IV SOLN
4.0000 g | Freq: Once | INTRAVENOUS | Status: AC
  Administered 2014-10-31: 4 g via INTRAVENOUS
  Filled 2014-10-31: qty 100

## 2014-10-31 MED ORDER — POTASSIUM CHLORIDE 10 MEQ/100ML IV SOLN
10.0000 meq | INTRAVENOUS | Status: AC
  Administered 2014-10-31 (×4): 10 meq via INTRAVENOUS
  Filled 2014-10-31: qty 100

## 2014-10-31 MED ORDER — HYDRALAZINE HCL 25 MG PO TABS
25.0000 mg | ORAL_TABLET | Freq: Three times a day (TID) | ORAL | Status: DC
  Administered 2014-10-31 – 2014-11-03 (×10): 25 mg via ORAL
  Filled 2014-10-31 (×13): qty 1

## 2014-10-31 MED ORDER — POTASSIUM CHLORIDE CRYS ER 20 MEQ PO TBCR
40.0000 meq | EXTENDED_RELEASE_TABLET | ORAL | Status: AC
  Administered 2014-10-31 (×2): 40 meq via ORAL
  Filled 2014-10-31: qty 2

## 2014-10-31 MED ORDER — SPIRONOLACTONE 25 MG PO TABS
25.0000 mg | ORAL_TABLET | Freq: Every day | ORAL | Status: DC
  Administered 2014-10-31 – 2014-11-06 (×7): 25 mg via ORAL
  Filled 2014-10-31 (×7): qty 1

## 2014-10-31 MED ORDER — FUROSEMIDE 10 MG/ML IJ SOLN
40.0000 mg | Freq: Once | INTRAMUSCULAR | Status: AC
  Administered 2014-10-31: 40 mg via INTRAVENOUS
  Filled 2014-10-31: qty 4

## 2014-10-31 MED ORDER — ONDANSETRON HCL 4 MG/2ML IJ SOLN: 4.0000 mg | Freq: Four times a day (QID) | INTRAMUSCULAR | Status: DC | PRN

## 2014-10-31 MED ORDER — MEMANTINE HCL 5 MG PO TABS
5.0000 mg | ORAL_TABLET | Freq: Two times a day (BID) | ORAL | Status: DC
  Filled 2014-10-31: qty 1

## 2014-10-31 MED ORDER — FUROSEMIDE 10 MG/ML IJ SOLN
40.0000 mg | Freq: Two times a day (BID) | INTRAMUSCULAR | Status: DC
Start: 2014-10-31 — End: 2014-11-02
  Administered 2014-10-31 – 2014-11-02 (×4): 40 mg via INTRAVENOUS
  Filled 2014-10-31 (×4): qty 4

## 2014-10-31 MED ORDER — ATORVASTATIN CALCIUM 40 MG PO TABS
40.0000 mg | ORAL_TABLET | Freq: Every day | ORAL | Status: DC
  Administered 2014-10-31 – 2014-11-06 (×7): 40 mg via ORAL
  Filled 2014-10-31 (×7): qty 1

## 2014-10-31 NOTE — H&P (Signed)
Admission History and Physical     Patient ID: [name] Mccard, MRN: [id number], DOB: [birth year] 80 y.o. Date of Encounter: 10/31/2014, 12:28 AM  Primary Physician: Kirstie PeriSHAH,ASHISH, MD Primary Cardiologist: Doylene CanardHochrein ([place]) Chief Complaint:  SOB  History of Present Illness: [name] Carone is a 80 y.o. female with ischemic cardiomyopathy, s/p CABGx3 and PCI, [facility] at which time she had PCI to her native RCA and an EF of 25-30% on LV gram.  [facility], and was observed in the ED to have sustained VT that appeared to be treated by her ICD.  This happened two times per the ED physician. She was placed on amiodarone gtt and her potassium was supplemented from 2.8, and [facility]  On arrival to the CCU, she is resting comfortably in no distress.  She denies CP.  States that for several days she has had worsening DOE and non-productive cough.  Denies worsening orthopnea or PND. She has lower extremity edema but not sure if this has been getting worse.    Past Medical History  Diagnosis Date  . Coronary artery disease     a. 2009 CABG x 3(LIMA->LAD, VG->RAMUS, VG->OM;  b. 04/2012 Cath/PCI: LM nl, LAD 50-70p,3733m, RI 12368m, LCX occluded OM's, RCA 3656m, 30-40d, PDA 70p, VG->RI nl, VG->OM nl, LIMA->LAD nl, EF 25-30%. RCA stented w/ 2.25x28 & 2.5x878mm Xpedition DES'.   . Hypertension   . Hyperlipidemia   . Type II diabetes mellitus   . Alzheimer's dementia   . Chronic systolic CHF (congestive heart failure)     a. 04/2012 EF 25-30%, viable myocardium by rest thallium redistribution      Past Surgical History  Procedure Laterality Date  . Coronary artery bypass graft  ~ 2009    CABG X1  . Goiter removed    . Cardiac catheterization  04/07/2012  . Left and right heart catheterization with coronary/graft angiogram N/A 04/07/2012    Procedure: LEFT AND RIGHT HEART CATHETERIZATION WITH Isabel CapriceORONARY/GRAFT ANGIOGRAM;  Surgeon: Vesta MixerPhilip  J Nahser, MD;  [facility];  Service: Cardiovascular;  Laterality: N/A;  . Percutaneous coronary stent intervention (pci-s) N/A 04/11/2012    Procedure: PERCUTANEOUS CORONARY STENT INTERVENTION (PCI-S);  Surgeon: Herby Abrahamhomas D Stuckey, MD;  [facility];  Service: Cardiovascular;  Laterality: N/A;      Current Facility-Administered Medications  Medication Dose Route Frequency Provider Last Rate Last Dose  . acetaminophen (TYLENOL) tablet 650 mg  650 mg Oral Q4H PRN Jeannie DoneJoseph A Sivak, MD      . atorvastatin (LIPITOR) tablet 40 mg  40 mg Oral q1800 Jeannie DoneJoseph A Sivak, MD      . donepezil (ARICEPT) tablet 5 mg  5 mg Oral QHS Jeannie DoneJoseph A Sivak, MD      . Melene Muller[START ON 11/01/2014] heparin injection 5,000 Units  5,000 Units Subcutaneous Q12H Jeannie DoneJoseph A Sivak, MD      . insulin aspart (novoLOG) injection 0-15 Units  0-15 Units Subcutaneous TID WC Jeannie DoneJoseph A Sivak, MD      . insulin aspart (novoLOG) injection 0-5 Units  0-5 Units Subcutaneous QHS Jeannie DoneJoseph A Sivak, MD      . levothyroxine (SYNTHROID, LEVOTHROID) tablet 25 mcg  25 mcg Oral QAC breakfast Jeannie DoneJoseph A Sivak, MD      . lisinopril (PRINIVIL,ZESTRIL) tablet 10 mg  10 mg Oral Daily Jeannie DoneJoseph A Sivak, MD      . memantine [facility](NAMENDA) tablet 5 mg  5  mg Oral BID Jeannie Done, MD      . ondansetron [facility]) injection 4 mg  4 mg Intravenous Q6H PRN Jeannie Done, MD      . spironolactone (ALDACTONE) tablet 25 mg  25 mg Oral Daily Jeannie Done, MD          Allergies: No Known Allergies   Social History:  The patient  reports that she has never smoked. She has never used smokeless tobacco. She reports that she does not drink alcohol or use illicit drugs.   Family History:  The patient's family history is not on file.   ROS:  Please see the history of present illness.   All other systems reviewed and negative.   Vital Signs: There were no vitals taken for this visit.  PHYSICAL EXAM: General: Elderly female in NAD HEENT: normal Lymph: no  adenopathy Neck: JVP elevated to 12cm + HJR Endocrine:  No thryomegaly Cardiac:  Bradycardic and somewhat irregular, SEM at RLSB. Lungs:  Scattered wheezing Abd: distended but soft, non tender Ext: 1+ pitting edema bilaterally. Musculoskeletal:  No deformities, BUE and BLE strength normal and equal Skin: warm and dry Neuro:  CNs 2-12 intact, no focal abnormalities noted Psych:  Normal affect   EKG: sinus rhythm with Mobitz II block, as well as frequent PVCs and fusion beats.    Labs:   Lab Results  Component Value Date   WBC 4.7 04/12/2012   HGB 12.1 04/12/2012   HCT 36.4 04/12/2012   MCV 87.9 04/12/2012   PLT 224 04/12/2012   No results for input(s): NA, K, CL, CO2, BUN, CREATININE, CALCIUM, PROT, BILITOT, ALKPHOS, ALT, AST, GLUCOSE in the last 168 hours.  Invalid input(s): LABALBU No results for input(s): CKTOTAL, CKMB, TROPONINI in the last 72 hours. No results found for: CHOL, HDL, LDLCALC, TRIG No results found for: DDIMER  Radiology/Studies:  No results found.   ASSESSMENT AND PLAN:   1. Ventricular Tachycardia - Interrogate ICD - Continue amiodarone - replete K>4.0, Mg > 2.0 - trend troponin  2. CHF exacerbation - She is volume overloaded on exam and this likely explains some of her DOE.  Will gently diurese with IV lasix tonight in setting of VT and electrolyte disturbances.  3. Bradycardia - Her ECG shows sinus rhythm with Mobitz II block, as well as frequent PVCs and fusion beats.  Bradycardia could also be contributing to her DOE. - Hold home coreg in setting of amiodarone gtt. - Electrolyte repletion - ICD interrogation (and back up rate increase?)   Yaakov Guthrie, MD   Signed,  Yaakov Guthrie, MD 10/31/2014, 12:28 AM

## 2014-10-31 NOTE — Progress Notes (Signed)
Shortly after examining her she complained of acute dyspnea - poor airflow movement. Suspect flash pulmonary edema. CXR ordered which confirms this. Given 40 mg IV lasix now. Place foley. Keep NPO and bedrest. Called RT for acute bipap. SPo2 still 100%, but using accessory muscles to breath.  Chrystie NoseKenneth C. Hilty, MD, [facility] Attending Cardiologist [facility]

## 2014-10-31 NOTE — Progress Notes (Signed)
Inpatient Diabetes Program Recommendations  AACE/ADA: New Consensus Statement on Inpatient Glycemic Control (2013)  Target Ranges:  Prepandial:   less than 140 mg/dL      Peak postprandial:   less than 180 mg/dL (1-2 hours)      Critically ill patients:  140 - 180 mg/dL   Inpatient Diabetes Program Recommendations HgbA1C: order to assess prehospital glucose control Diet: add carb modified to current heart healthy diet Thank you  Piedad ClimesGina Israel Werts BSN, RN,CDE Inpatient Diabetes Coordinator [phone] (team pager)

## 2014-10-31 NOTE — Progress Notes (Signed)
eLink Physician-Brief Progress Note Patient Name: [name] DOB: 29-Mar-1935 MRN: [id number]   Date of Service  10/31/2014  HPI/Events of Note  Ischemic CMP with VT s/p AICD firing, on amio gtt, hypokalemia  eICU Interventions  none     Intervention Category Evaluation Type: New Patient Evaluation  [name] V. 10/31/2014, 1:46 AM

## 2014-10-31 NOTE — Progress Notes (Signed)
Patient placed on Bipap per MD order.  Currently tolerating well.  RT will continue to monitor.

## 2014-10-31 NOTE — Consult Note (Signed)
ELECTROPHYSIOLOGY CONSULT NOTE  Patient ID: [name], MRN: [id number], DOB/AGE: May 13, 1935 80 y.o. Admit date: 10/30/2014 Date of Consult: 10/31/2014  Primary Physician: [name], MD Primary Cardiologist: Newt Lukes Chief Complaint:  ICD shocks   HPI [name] is a 80 y.o. female  With a history of ischemic heart disease prior bypass surgery 3 in 2009 non-STEMI 2013 and again in 2014. Initially seen by [facility] cardiology at which time ICD was deferred secondary to acute revascularization. She then saw Dr. Rise Mu in [place] following stenting of her RCA. It was felt that she was a candidate for defibrillator and this was implanted. June 2016.  His notes at that time are notable for heart rate is 76 and a description of her ECG demonstrating a normal PR interval and a QRS duration of 137 with an IVCD area as it was elected to give her single-chamber device.  She was seen last night at [facility] where she presented with weakness. She has also been seeing a month before. Those laboratory data are pending at this time. We will also work on getting heart rate data  the family says that she's been told for some time she has a slow heart rate. Detective work done by nursing here found out that her last potassium prescription was 2013.  While in the emergency room she was noted to have ventricular tachycardia. This was treated by her device. Interrogation of her device demonstrated recurrent episodes of polymorphic ventricular tachycardia that were pause dependent. Notably her potassium level was 2.8.   Past Medical History  Diagnosis Date  . Coronary artery disease     a. 2009 CABG x 3(LIMA->LAD, VG->RAMUS, VG->OM;  b. 04/2012 Cath/PCI: LM nl, LAD 50-70p,41m, RI 161m, LCX occluded OM's, RCA 45m, 30-40d, PDA 70p, VG->RI nl, VG->OM nl, LIMA->LAD nl, EF 25-30%. RCA stented w/ 2.25x28 & 2.5x57mm Xpedition DES'.   . Hypertension   . Hyperlipidemia   . Type II diabetes  mellitus   . Alzheimer's dementia   . Chronic systolic CHF (congestive heart failure)     a. 04/2012 EF 25-30%, viable myocardium by rest thallium redistribution       Surgical History:  Past Surgical History  Procedure Laterality Date  . Coronary artery bypass graft  ~ 2009    CABG X1  . Goiter removed    . Cardiac catheterization  04/07/2012  . Left and right heart catheterization with coronary/graft angiogram N/A 04/07/2012    Procedure: LEFT AND RIGHT HEART CATHETERIZATION WITH Isabel Caprice;  Surgeon: Vesta Mixer, MD;  Location: [facility] C[facility] LAB;  Service: Cardiovascular;  Laterality: N/A;  . Percutaneous coronary stent intervention (pci-s) N/A 04/11/2012    Procedure: PERCUTANEOUS CORONARY STENT INTERVENTION (PCI-S);  Surgeon: Herby Abraham, MD;  Location: [facility] CATH LAB;  Service: Cardiovascular;  Laterality: N/A;     Home Meds: Prior to Admission medications   Medication Sig Start Date End Date Taking? Authorizing Provider  aspirin EC 81 MG tablet Take 81 mg by mouth daily.   Yes Historical Provider, MD  atorvastatin (LIPITOR) 40 MG tablet Take 40 mg by mouth daily. 09/26/14  Yes Historical Provider, MD  carvedilol (COREG) 6.25 MG tablet Take 6.25 mg by mouth 2 (two) times daily with a meal.   Yes Historical Provider, MD  clopidogrel (PLAVIX) 75 MG tablet Take 75 mg by mouth daily. 01/09/14  Yes Historical Provider, MD  ferrous sulfate 325 (65 FE) MG tablet Take 325 mg by mouth daily. 11/30/13  Yes Historical  Provider, MD  furosemide (LASIX) 40 MG tablet Take 40 mg by mouth daily.   Yes Historical Provider, MD  glyBURIDE (DIABETA) 5 MG tablet Take 5 mg by mouth daily with breakfast.    Yes Historical Provider, MD  levothyroxine (SYNTHROID, LEVOTHROID) 25 MCG tablet Take 25 mcg by mouth daily. 08/27/14  Yes Historical Provider, MD  lisinopril (PRINIVIL,ZESTRIL) 10 MG tablet Take 10 mg by mouth daily. 10/12/14  Yes Historical Provider, MD  memantine (NAMENDA) 10 MG tablet Take 10  mg by mouth 2 (two) times daily. 10/23/14  Yes Historical Provider, MD  VENTOLIN HFA 108 (90 BASE) MCG/ACT inhaler Inhale into the lungs 4 (four) times daily. 10/18/14  Yes Historical Provider, MD  atorvastatin (LIPITOR) 40 MG tablet Take 1 tablet (40 mg total) by mouth daily at 6 PM. 04/13/12 04/13/13  Ok Anis, NP  carvedilol (COREG) 6.25 MG tablet Take 1 tablet (6.25 mg total) by mouth 2 (two) times daily with a meal. 04/13/12 04/13/13  Ok Anis, NP  ferrous gluconate (FERGON) 324 MG tablet Take 1 tablet (324 mg total) by mouth daily with breakfast. 04/13/12 04/13/13  Ok Anis, NP  furosemide (LASIX) 40 MG tablet Take 1 tablet (40 mg total) by mouth daily. 04/13/12 04/13/13  Ok Anis, NP  hydrochlorothiazide (HYDRODIURIL) 25 MG tablet Take 25 mg by mouth daily. 12/24/11   Historical Provider, MD  levothyroxine (SYNTHROID, LEVOTHROID) 25 MCG tablet Take 1 tablet (25 mcg total) by mouth daily before breakfast. 04/13/12 04/13/13  Ok Anis, NP  lisinopril (PRINIVIL,ZESTRIL) 10 MG tablet Take 1 tablet (10 mg total) by mouth daily. 04/13/12 04/13/13  Ok Anis, NP  nitroGLYCERIN (NITROSTAT) 0.4 MG SL tablet Place 1 tablet (0.4 mg total) under the tongue every 5 (five) minutes x 3 doses as needed for chest pain. 04/13/12 04/13/13  Ok Anis, NP  spironolactone (ALDACTONE) 25 MG tablet Take 1 tablet (25 mg total) by mouth daily. 04/13/12 04/13/13  Ok Anis, NP    Inpatient Medications:  . antiseptic oral rinse  7 mL Mouth Rinse BID  . atorvastatin  40 mg Oral q1800  . furosemide  40 mg Intravenous BID  . [START ON 11/01/2014] heparin  5,000 Units Subcutaneous Q12H  . hydrALAZINE  25 mg Oral 3 times per day  . insulin aspart  0-15 Units Subcutaneous TID WC  . insulin aspart  0-5 Units Subcutaneous QHS  . levothyroxine  25 mcg Oral QAC breakfast  . lisinopril  20 mg Oral Daily  . potassium chloride SA  20 mEq Oral Daily  . spironolactone  25 mg Oral  Daily    Allergies: No Known Allergies  History   Social History  . Marital Status: Married    Spouse Name: N/A  . Number of Children: N/A  . Years of Education: N/A   Occupational History  . Not on file.   Social History Main Topics  . Smoking status: Never Smoker   . Smokeless tobacco: Never Used  . Alcohol Use: No  . Drug Use: No  . Sexual Activity: Yes   Other Topics Concern  . Not on file   Social History Narrative     No family history on file.   ROS:  Please see the history of present illness.     All other systems reviewed and negative.    Physical Exam:   Blood pressure 137/74, pulse 49, temperature 97.5 F (36.4 C), temperature source Axillary, resp. rate 19,  height 5\' 6"  (1.676 m), weight 178 lb 9.2 oz (81 kg), SpO2 100 %. General: Well developed, well nourished female in no acute distress. Head: Normocephalic, atraumatic, sclera non-icteric, no xanthomas, nares are without discharge. EENT: normal Lymph Nodes:  none Back: without scoliosis/kyphosis , no CVA tendersness Neck: Negative for carotid bruits. JVD <6Lungs: Clear bilaterally to auscultation without wheezes, rales, or rhonchi. Breathing is unlabored. Heart: RRR with  Variable S1 S2.  2/6 systolic murmur , rubs, or gallops appreciated. Abdomen: Soft, non-tender, non-distended with normoactive bowel sounds. No hepatomegaly. No rebound/guarding. No obvious abdominal masses. Msk:  Strength and tone appear normal for age. Extremities: No clubbing or cyanosis. No*  edema.  Distal pedal pulses are 2+ and equal bilaterally. Skin: Warm and Dry Neuro: Alert and oriented X  . CN III-XII intact Grossly normal sensory and motor function . Psych:  Responds to questions appropriately with a normal affect.      Labs: Cardiac Enzymes  Recent Labs  10/31/14 0056 10/31/14 0350  TROPONINI 0.15* 0.19*   CBC Lab Results  Component Value Date   WBC 4.8 10/31/2014   HGB 11.5* 10/31/2014   HCT 35.3*  10/31/2014   MCV 88.7 10/31/2014   PLT 188 10/31/2014   PROTIME: No results for input(s): LABPROT, INR in the last 72 hours. Chemistry  Recent Labs Lab 10/31/14 0350  NA 137  K 3.3*  CL 99  CO2 27  BUN 11  CREATININE 0.92  CALCIUM 7.5*  GLUCOSE 229*   Lipids No results found for: CHOL, HDL, LDLCALC, TRIG BNP No results found for: PROBNP Thyroid Function Tests: No results for input(s): TSH, T4TOTAL, T3FREE, THYROIDAB in the last 72 hours.  Invalid input(s): FREET3    Miscellaneous No results found for: DDIMER  Radiology/Studies:  Dg Chest Port 1 View  10/31/2014   CLINICAL DATA:  Shortness of breath, history of coronary artery disease with CABG and stent placement ; AICD placement.  EXAM: PORTABLE CHEST - 1 VIEW  COMPARISON:  None.  FINDINGS: The lungs are adequately inflated. The interstitial markings are increased bilaterally. There is no pleural effusion or pneumothorax. The cardiac silhouette is enlarged. The pulmonary vascularity is indistinct without definite cephalization. There are 7 intact sternal wires. The AICD is in appropriate position radiographically.  IMPRESSION: 1. Low-grade CHF. 2. Appropriate positioning of the AICD without evidence of postprocedure complication.   Electronically Signed   By: David  [place]   On: 10/31/2014 08:58    EKG: sinus 65 1729   CHB  RBBB escape 0300  Sinus 59 16/16/64 With MBZ2 heart block       Assessment and Plan:  Polymorphic ventricular tachycardia-pause dependent  Ischemic cardiomyopathy with prior bypass and revascularization  Complete heart block new  Amiodarone therapy initiated for VT  IVCD at baseline  Hypokalemia  Sinus bradycardia  Dementia  The patient presents with polymorphic ventricular tachycardia in the setting of hypokalemia. She had been on diuretics without potassium repletion for some time. We are trying to obtain laboratories from one month ago.  Potassium repletion is key  at this  juncture. We will also supplement magnesium.  I fear that amiodarone is aggravating her conduction system disease which was present last night and is now evolved to complete heart block. It is possible that her "slowing heart rate" over recent weeks and months he also be related to aggressive conduction system disease.  In the event that this is not recovered, she will certainly need device upgrade  It  is possible that ischemia is contributing to her polymorphic ventricular tachycardia QT prolongation although OCCAM'S razor would suggest that it should not be contributory.  It is also notable that Namenda can aggravate bradycardia; according to her husband oh, medications and has not been changed in many many months   Sherryl MangesSteven Tran Randle

## 2014-10-31 NOTE — Care Management Note (Addendum)
    Page 1 of 1   11/04/2014     9:29:37 AM CARE MANAGEMENT NOTE 11/04/2014  Patient:  [name],[name]   Account Number:  [id number]  Date Initiated:  10/31/2014  Documentation initiated by:  Junius CreamerWELL,DEBBIE  Subjective/Objective Assessment:   adm w svt     Action/Plan:   lives w husband, dr a shah   Anticipated DC Date:     Anticipated DC Plan:  HOME/SELF CARE         Choice offered to / List presented to:             Status of service:   Medicare Important Message given?  YES (If response is "NO", the following Medicare IM given date fields will be blank) Date Medicare IM given:  11/04/2014 Medicare IM given by:  Junius CreamerWELL,DEBBIE Date Additional Medicare IM given:   Additional Medicare IM given by:    Discharge Disposition:    Per UR Regulation:  Reviewed for med. necessity/level of care/duration of stay  If discussed at Long Length of Stay Meetings, dates discussed:   11/05/2014    Comments:

## 2014-10-31 NOTE — Progress Notes (Signed)
Seems to be doing well on bipap - good urine output with IV lasix. Will continue diuresis, try to wean off bipap. Review of care everywhere reveals she has been seeing [facility]ie [facility]Lingle [facility] [place]. She had placement of a single chamber Medtronic EveraXT on 02/05/2015 by Dr. Christin FudgeHegland in [place]. She has had 9 VF events, 1 pace-terminated episode and 3 shock terminated episodes. 6 aborted charges. Looks like pause-dependent VT, most of which is polymorphic. 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 ?need for dual chamber upgrade with higher rate pacing.  Await EP input today.  Chrystie NoseKenneth C. Kaleena Corrow, MD, [facility] Attending Cardiologist [facility]

## 2014-10-31 NOTE — Progress Notes (Signed)
Admission note reviewed and patient seen and examined for am update. [facility], was noted to have VT and AICD firing. I have reviewed her records here - I do not see what type of AICD she has. Was seen by Dr. Graciela HusbandsKlein in 2013, not a pacer candidate at that time. Seems to have [place] the [place]. She has no memory of it. [facility] and has been quiescent overnight. Plan diuresis today - lasix 40 mg IV x 1. Bp elevated, increase lisinopril to 20 mg daily. Will ask EP to see and interrogate AICD. Mild troponin elevation - may be consistent with shock and/or CHF - does not exhibit typical rise and fall associated with ACS. No chest pain complaints.  Chrystie NoseKenneth C. Tawnya Pujol, MD, [facility] Attending Cardiologist [facility]

## 2014-11-01 DIAGNOSIS — Z95810 Presence of automatic (implantable) cardiac defibrillator: Secondary | ICD-10-CM | POA: Diagnosis present

## 2014-11-01 LAB — BASIC METABOLIC PANEL
Anion gap: 9 (ref 5–15)
BUN: 12 mg/dL (ref 6–23)
CALCIUM: 7.9 mg/dL — AB (ref 8.4–10.5)
CO2: 29 mmol/L (ref 19–32)
Chloride: 98 mmol/L (ref 96–112)
Creatinine, Ser: 1.02 mg/dL (ref 0.50–1.10)
GFR calc Af Amer: 60 mL/min — ABNORMAL LOW (ref >90)
GFR calc non Af Amer: 52 mL/min — ABNORMAL LOW (ref >90)
GLUCOSE: 106 mg/dL — AB (ref 70–99)
POTASSIUM: 4.2 mmol/L (ref 3.5–5.1)
SODIUM: 136 mmol/L (ref 135–145)

## 2014-11-01 LAB — GLUCOSE, CAPILLARY
GLUCOSE-CAPILLARY: 180 mg/dL — AB (ref 70–99)
GLUCOSE-CAPILLARY: 136 mg/dL — AB (ref 70–99)
GLUCOSE-CAPILLARY: 143 mg/dL — AB (ref 70–99)
Glucose-Capillary: 168 mg/dL — ABNORMAL HIGH (ref 70–99)
Glucose-Capillary: 143 mg/dL — ABNORMAL HIGH (ref 70–99)

## 2014-11-01 LAB — BRAIN NATRIURETIC PEPTIDE: B NATRIURETIC PEPTIDE 5: 583.7 pg/mL — AB (ref 0.0–100.0)

## 2014-11-01 MED ORDER — ASPIRIN EC 81 MG PO TBEC
81.0000 mg | DELAYED_RELEASE_TABLET | Freq: Every day | ORAL | Status: DC
  Administered 2014-11-01 – 2014-11-06 (×5): 81 mg via ORAL
  Filled 2014-11-01 (×7): qty 1

## 2014-11-01 MED ORDER — CLOPIDOGREL BISULFATE 75 MG PO TABS
75.0000 mg | ORAL_TABLET | Freq: Every day | ORAL | Status: DC
  Administered 2014-11-01 – 2014-11-06 (×5): 75 mg via ORAL
  Filled 2014-11-01 (×6): qty 1

## 2014-11-01 MED ORDER — FERROUS SULFATE 325 (65 FE) MG PO TABS
325.0000 mg | ORAL_TABLET | Freq: Every day | ORAL | Status: DC
  Administered 2014-11-01 – 2014-11-04 (×5): 325 mg via ORAL
  Filled 2014-11-01 (×7): qty 1

## 2014-11-01 NOTE — Progress Notes (Signed)
DAILY PROGRESS NOTE  Subjective:  No events overnight. Breathing has improved. I's/O's about even, although she has been diuresing. Bipap removed yesterday. EP has evaluated today and feel that her heart block is recovering. Recommended electrolyte repletion and avoiding amiodarone.  Objective:  Temp:  [97.3 F (36.3 C)-97.9 F (36.6 C)] 97.9 F (36.6 C) (03/25 0800) Pulse Rate:  [41-129] 54 (03/25 0800) Resp:  [13-20] 18 (03/25 0800) BP: (119-185)/(54-107) 127/76 mmHg (03/25 0800) SpO2:  [89 %-100 %] 100 % (03/25 0800) FiO2 (%):  [40 %] 40 % (03/24 1200) Weight change:   Intake/Output from previous day: 03/24 0701 - 03/25 0700 In: 523.7 [P.O.:240; I.V.:183.7; IV Piggyback:100] Out: 830 [Urine:830]  Intake/Output from this shift: Total I/O In: 240 [P.O.:240] Out: -   Medications: Current Facility-Administered Medications  Medication Dose Route Frequency Provider Last Rate Last Dose  . acetaminophen (TYLENOL) tablet 650 mg  650 mg Oral Q4H PRN Herbert Deaner, MD      . antiseptic oral rinse (CPC / CETYLPYRIDINIUM CHLORIDE 0.05%) solution 7 mL  7 mL Mouth Rinse BID Darlin Coco, MD   7 mL at 10/31/14 2200  . atorvastatin (LIPITOR) tablet 40 mg  40 mg Oral q1800 Herbert Deaner, MD   40 mg at 10/31/14 1653  . furosemide (LASIX) injection 40 mg  40 mg Intravenous BID Pixie Casino, MD   40 mg at 11/01/14 0855  . heparin injection 5,000 Units  5,000 Units Subcutaneous Q12H Herbert Deaner, MD      . hydrALAZINE (APRESOLINE) tablet 25 mg  25 mg Oral 3 times per day Herbert Deaner, MD   25 mg at 11/01/14 0541  . insulin aspart (novoLOG) injection 0-15 Units  0-15 Units Subcutaneous TID WC Herbert Deaner, MD   5 Units at 10/31/14 1653  . insulin aspart (novoLOG) injection 0-5 Units  0-5 Units Subcutaneous QHS Herbert Deaner, MD   0 Units at 10/31/14 0030  . levothyroxine (SYNTHROID, LEVOTHROID) tablet 25 mcg  25 mcg Oral QAC breakfast Herbert Deaner, MD   25 mcg at 11/01/14  0809  . lisinopril (PRINIVIL,ZESTRIL) tablet 20 mg  20 mg Oral Daily Pixie Casino, MD   20 mg at 10/31/14 1025  . ondansetron (ZOFRAN) injection 4 mg  4 mg Intravenous Q6H PRN Herbert Deaner, MD      . spironolactone (ALDACTONE) tablet 25 mg  25 mg Oral Daily Herbert Deaner, MD   25 mg at 10/31/14 1025    Physical Exam: General appearance: alert and no distress Neck: no carotid bruit and no JVD Lungs: diminished breath sounds bibasilar Heart: regular rate and rhythm Abdomen: soft, non-tender; bowel sounds normal; no masses,  no organomegaly Extremities: extremities normal, atraumatic, no cyanosis or edema Pulses: 2+ and symmetric Skin: Skin color, texture, turgor normal. No rashes or lesions Neurologic: Mental status: Awake, but not fully oriented Psych: Pleasant, not agitated  Lab Results: Results for orders placed or performed during the hospital encounter of 10/30/14 (from the past 48 hour(s))  MRSA PCR Screening     Status: None   Collection Time: 10/31/14 12:01 AM  Result Value Ref Range   MRSA by PCR NEGATIVE NEGATIVE    Comment:        The GeneXpert MRSA Assay (FDA approved for NASAL specimens only), is one component of a comprehensive MRSA colonization surveillance program. It is not intended to diagnose MRSA infection nor to guide or monitor treatment for MRSA  infections.   Basic metabolic panel     Status: Abnormal   Collection Time: 10/31/14 12:52 AM  Result Value Ref Range   Sodium 134 (L) 135 - 145 mmol/L   Potassium 3.1 (L) 3.5 - 5.1 mmol/L   Chloride 94 (L) 96 - 112 mmol/L   CO2 28 19 - 32 mmol/L   Glucose, Bld 351 (H) 70 - 99 mg/dL   BUN 9 6 - 23 mg/dL   Creatinine, Ser 0.98 0.50 - 1.10 mg/dL   Calcium 7.6 (L) 8.4 - 10.5 mg/dL   GFR calc non Af Amer 54 (L) >90 mL/min   GFR calc Af Amer 63 (L) >90 mL/min    Comment: (NOTE) The eGFR has been calculated using the CKD EPI equation. This calculation has not been validated in all clinical  situations. eGFR's persistently <90 mL/min signify possible Chronic Kidney Disease.    Anion gap 12 5 - 15  Magnesium     Status: None   Collection Time: 10/31/14 12:52 AM  Result Value Ref Range   Magnesium 1.5 1.5 - 2.5 mg/dL  Troponin I     Status: Abnormal   Collection Time: 10/31/14 12:56 AM  Result Value Ref Range   Troponin I 0.15 (H) <0.031 ng/mL    Comment:        PERSISTENTLY INCREASED TROPONIN VALUES IN THE RANGE OF 0.04-0.49 ng/mL CAN BE SEEN IN:       -UNSTABLE ANGINA       -CONGESTIVE HEART FAILURE       -MYOCARDITIS       -CHEST TRAUMA       -ARRYHTHMIAS       -LATE PRESENTING MYOCARDIAL INFARCTION       -COPD   CLINICAL FOLLOW-UP RECOMMENDED.   Glucose, capillary     Status: Abnormal   Collection Time: 10/31/14  1:03 AM  Result Value Ref Range   Glucose-Capillary 192 (H) 70 - 99 mg/dL  Basic metabolic panel     Status: Abnormal   Collection Time: 10/31/14  3:50 AM  Result Value Ref Range   Sodium 137 135 - 145 mmol/L   Potassium 3.3 (L) 3.5 - 5.1 mmol/L   Chloride 99 96 - 112 mmol/L   CO2 27 19 - 32 mmol/L   Glucose, Bld 229 (H) 70 - 99 mg/dL   BUN 11 6 - 23 mg/dL   Creatinine, Ser 0.92 0.50 - 1.10 mg/dL   Calcium 7.5 (L) 8.4 - 10.5 mg/dL   GFR calc non Af Amer 59 (L) >90 mL/min   GFR calc Af Amer 68 (L) >90 mL/min    Comment: (NOTE) The eGFR has been calculated using the CKD EPI equation. This calculation has not been validated in all clinical situations. eGFR's persistently <90 mL/min signify possible Chronic Kidney Disease.    Anion gap 11 5 - 15  CBC     Status: Abnormal   Collection Time: 10/31/14  3:50 AM  Result Value Ref Range   WBC 4.8 4.0 - 10.5 K/uL   RBC 3.98 3.87 - 5.11 MIL/uL   Hemoglobin 11.5 (L) 12.0 - 15.0 g/dL   HCT 35.3 (L) 36.0 - 46.0 %   MCV 88.7 78.0 - 100.0 fL   MCH 28.9 26.0 - 34.0 pg   MCHC 32.6 30.0 - 36.0 g/dL   RDW 13.7 11.5 - 15.5 %   Platelets 188 150 - 400 K/uL  Troponin I     Status: Abnormal   Collection  Time:  10/31/14  3:50 AM  Result Value Ref Range   Troponin I 0.19 (H) <0.031 ng/mL    Comment:        PERSISTENTLY INCREASED TROPONIN VALUES IN THE RANGE OF 0.04-0.49 ng/mL CAN BE SEEN IN:       -UNSTABLE ANGINA       -CONGESTIVE HEART FAILURE       -MYOCARDITIS       -CHEST TRAUMA       -ARRYHTHMIAS       -LATE PRESENTING MYOCARDIAL INFARCTION       -COPD   CLINICAL FOLLOW-UP RECOMMENDED.   Glucose, capillary     Status: Abnormal   Collection Time: 10/31/14  7:38 AM  Result Value Ref Range   Glucose-Capillary 181 (H) 70 - 99 mg/dL   Comment 1 Capillary Specimen   Glucose, capillary     Status: Abnormal   Collection Time: 10/31/14 12:26 PM  Result Value Ref Range   Glucose-Capillary 189 (H) 70 - 99 mg/dL   Comment 1 Capillary Specimen   Glucose, capillary     Status: Abnormal   Collection Time: 10/31/14  4:31 PM  Result Value Ref Range   Glucose-Capillary 204 (H) 70 - 99 mg/dL  Basic metabolic panel     Status: Abnormal   Collection Time: 10/31/14  7:30 PM  Result Value Ref Range   Sodium 134 (L) 135 - 145 mmol/L   Potassium 3.5 3.5 - 5.1 mmol/L   Chloride 95 (L) 96 - 112 mmol/L   CO2 27 19 - 32 mmol/L   Glucose, Bld 213 (H) 70 - 99 mg/dL   BUN 12 6 - 23 mg/dL   Creatinine, Ser 1.15 (H) 0.50 - 1.10 mg/dL   Calcium 7.6 (L) 8.4 - 10.5 mg/dL   GFR calc non Af Amer 45 (L) >90 mL/min   GFR calc Af Amer 52 (L) >90 mL/min    Comment: (NOTE) The eGFR has been calculated using the CKD EPI equation. This calculation has not been validated in all clinical situations. eGFR's persistently <90 mL/min signify possible Chronic Kidney Disease.    Anion gap 12 5 - 15  Glucose, capillary     Status: Abnormal   Collection Time: 10/31/14  9:40 PM  Result Value Ref Range   Glucose-Capillary 180 (H) 70 - 99 mg/dL   Comment 1 Capillary Specimen   Basic metabolic panel     Status: Abnormal   Collection Time: 11/01/14  3:20 AM  Result Value Ref Range   Sodium 136 135 - 145 mmol/L    Potassium 4.2 3.5 - 5.1 mmol/L   Chloride 98 96 - 112 mmol/L   CO2 29 19 - 32 mmol/L   Glucose, Bld 106 (H) 70 - 99 mg/dL   BUN 12 6 - 23 mg/dL   Creatinine, Ser 1.02 0.50 - 1.10 mg/dL   Calcium 7.9 (L) 8.4 - 10.5 mg/dL   GFR calc non Af Amer 52 (L) >90 mL/min   GFR calc Af Amer 60 (L) >90 mL/min    Comment: (NOTE) The eGFR has been calculated using the CKD EPI equation. This calculation has not been validated in all clinical situations. eGFR's persistently <90 mL/min signify possible Chronic Kidney Disease.    Anion gap 9 5 - 15  Brain natriuretic peptide     Status: Abnormal   Collection Time: 11/01/14  7:33 AM  Result Value Ref Range   B Natriuretic Peptide 583.7 (H) 0.0 - 100.0 pg/mL    Imaging:  Dg Chest Port 1 View  10/31/2014   CLINICAL DATA:  Shortness of breath, history of coronary artery disease with CABG and stent placement ; AICD placement.  EXAM: PORTABLE CHEST - 1 VIEW  COMPARISON:  None.  FINDINGS: The lungs are adequately inflated. The interstitial markings are increased bilaterally. There is no pleural effusion or pneumothorax. The cardiac silhouette is enlarged. The pulmonary vascularity is indistinct without definite cephalization. There are 7 intact sternal wires. The AICD is in appropriate position radiographically.  IMPRESSION: 1. Low-grade CHF. 2. Appropriate positioning of the AICD without evidence of postprocedure complication.   Electronically Signed   By: David  Martinique   On: 10/31/2014 08:58    Assessment:  1. Principal Problem: 2.   Torsades de pointes 3. Active Problems: 4.   Hypertension 5.   High cholesterol 6.   Type II diabetes mellitus 7.   Acute systolic CHF (congestive heart failure) 8.   CAD (coronary artery disease) 9.   Mobitz type 2 second degree atrioventricular block 10.   Complete heart block 11.   Plan:  1. Pause-dependent VT - possibly exacerbated by amiodarone and hypokalemia. Continue electrolyte repletion. Monitoring for  recovery of heart block. May ultimately need CRT-D upgrade. 2. Acute systolic congestive heart failure - breathing has improved with diuresis. Continue IV diuresis today. May be able to switch to po diuretics tomorrow. 3. HTN - bp generally well-controlled. Continue current medications - work toward afterload reduction. 4. DM2- am BG was 102 this am. Continue current treatment.  Can transfer to stepdown status - but remain on 2H if possible due to low HR.  Time Spent Directly with Patient:  30 minutes  Length of Stay:  LOS: 2 days   Pixie Casino, MD, [facility] Attending Cardiologist [facility]  HILTY,Kenneth C 11/01/2014, 9:39 AM

## 2014-11-01 NOTE — Progress Notes (Signed)
       Patient Name: Bernadene PersonDella Schelling      SUBJECTIVE:feels better thjis am Does not remember me  Past Medical History  Diagnosis Date  . Coronary artery disease     a. 2009 CABG x 3(LIMA->LAD, VG->RAMUS, VG->OM;  b. 04/2012 Cath/PCI: LM nl, LAD 50-70p,8167m, RI 15628m, LCX occluded OM's, RCA 4438m, 30-40d, PDA 70p, VG->RI nl, VG->OM nl, LIMA->LAD nl, EF 25-30%. RCA stented w/ 2.25x28 & 2.5x688mm Xpedition DES'.   . Hypertension   . Hyperlipidemia   . Type II diabetes mellitus   . Alzheimer's dementia   . Chronic systolic CHF (congestive heart failure)     a. 04/2012 EF 25-30%, viable myocardium by rest thallium redistribution     Scheduled Meds:  Scheduled Meds: . antiseptic oral rinse  7 mL Mouth Rinse BID  . atorvastatin  40 mg Oral q1800  . furosemide  40 mg Intravenous BID  . heparin  5,000 Units Subcutaneous Q12H  . hydrALAZINE  25 mg Oral 3 times per day  . insulin aspart  0-15 Units Subcutaneous TID WC  . insulin aspart  0-5 Units Subcutaneous QHS  . levothyroxine  25 mcg Oral QAC breakfast  . lisinopril  20 mg Oral Daily  . spironolactone  25 mg Oral Daily   Continuous Infusions:  acetaminophen, ondansetron (ZOFRAN) IV    PHYSICAL EXAM Filed Vitals:   11/01/14 0300 11/01/14 0400 11/01/14 0500 11/01/14 0600  BP: 144/75 119/71 138/69 140/82  Pulse: 45 41 44 57  Temp:  97.3 F (36.3 C)    TempSrc:  Oral    Resp: 16 17 15 17   Height:      Weight:      SpO2: 100% 100% 100% 100%    Well developed and nourished in no acute distress HENT normal Neck supple Clear Regular rate and rhythm, no murmurs or gallops Abd-soft with active BS No Clubbing cyanosis edema Skin-warm and dry A & Oriented  Grossly normal sensory and motor function   TELEMETRY: Reviewed telemetry pt in *no vt intermittent high grade block    Intake/Output Summary (Last 24 hours) at 11/01/14 0659 Last data filed at 11/01/14 0600  Gross per 24 hour  Intake  540.4 ml  Output    830 ml  Net  -289.6 ml    LABS: Basic Metabolic Panel:  Recent Labs Lab 10/31/14 0052 10/31/14 0350 10/31/14 1930 11/01/14 0320  NA 134* 137 134* 136  K 3.1* 3.3* 3.5 4.2  CL 94* 99 95* 98  CO2 28 27 27 29   GLUCOSE 351* 229* 213* 106*  BUN 9 11 12 12   CREATININE 0.98 0.92 1.15* 1.02  CALCIUM 7.6* 7.5* 7.6* 7.9*  MG 1.5  --   --   --    Cardiac Enzymes:  Recent Labs  10/31/14 0056 10/31/14 0350  TROPONINI 0.15* 0.19*   CBC:  Recent Labs Lab 10/31/14 0350  WBC 4.8  HGB 11.5*  HCT 35.3*  MCV 88.7  PLT 188      ASSESSMENT AND PLAN:  Principal Problem:   Torsades de pointes Active Problems:   Hypertension   High cholesterol   Type II diabetes mellitus   Acute systolic CHF (congestive heart failure)   CAD (coronary artery disease)   Mobitz type 2 second degree atrioventricular block   Complete heart block  Hypokalemia resolved Heart block is improving  No VT can goto stepdown   Signed, Sherryl MangesSteven Inetta Dicke MD  11/01/2014

## 2014-11-02 ENCOUNTER — Encounter (HOSPITAL_COMMUNITY): Payer: Self-pay | Admitting: *Deleted

## 2014-11-02 DIAGNOSIS — E118 Type 2 diabetes mellitus with unspecified complications: Secondary | ICD-10-CM

## 2014-11-02 LAB — MAGNESIUM: MAGNESIUM: 2.3 mg/dL (ref 1.5–2.5)

## 2014-11-02 LAB — COMPREHENSIVE METABOLIC PANEL
ALBUMIN: 3.3 g/dL — AB (ref 3.5–5.2)
ALK PHOS: 66 U/L (ref 39–117)
ALT: 46 U/L — AB (ref 0–35)
AST: 36 U/L (ref 0–37)
Anion gap: 6 (ref 5–15)
BUN: 12 mg/dL (ref 6–23)
CHLORIDE: 101 mmol/L (ref 96–112)
CO2: 30 mmol/L (ref 19–32)
Calcium: 7.8 mg/dL — ABNORMAL LOW (ref 8.4–10.5)
Creatinine, Ser: 1.04 mg/dL (ref 0.50–1.10)
GFR, EST AFRICAN AMERICAN: 59 mL/min — AB (ref >90)
GFR, EST NON AFRICAN AMERICAN: 50 mL/min — AB (ref >90)
GLUCOSE: 139 mg/dL — AB (ref 70–99)
POTASSIUM: 4.3 mmol/L (ref 3.5–5.1)
Sodium: 137 mmol/L (ref 135–145)
TOTAL PROTEIN: 5.5 g/dL — AB (ref 6.0–8.3)
Total Bilirubin: 0.9 mg/dL (ref 0.3–1.2)

## 2014-11-02 LAB — GLUCOSE, CAPILLARY
GLUCOSE-CAPILLARY: 156 mg/dL — AB (ref 70–99)
Glucose-Capillary: 110 mg/dL — ABNORMAL HIGH (ref 70–99)
Glucose-Capillary: 123 mg/dL — ABNORMAL HIGH (ref 70–99)
Glucose-Capillary: 149 mg/dL — ABNORMAL HIGH (ref 70–99)
Glucose-Capillary: 106 mg/dL — ABNORMAL HIGH (ref 70–99)

## 2014-11-02 MED ORDER — FUROSEMIDE 40 MG PO TABS
40.0000 mg | ORAL_TABLET | Freq: Every day | ORAL | Status: DC
  Administered 2014-11-03 – 2014-11-06 (×4): 40 mg via ORAL
  Filled 2014-11-02 (×4): qty 1

## 2014-11-02 NOTE — Progress Notes (Signed)
DAILY PROGRESS NOTE  Subjective:  No events overnight.  HR remains low. Suspect she will need CRT-D upgrade next week. Breathing has improved.   Objective:  Temp:  [97.6 F (36.4 C)-98.6 F (37 C)] 98.6 F (37 C) (03/26 0815) Pulse Rate:  [40-112] 74 (03/26 0815) Resp:  [11-24] 23 (03/26 0815) BP: (96-164)/(28-117) 107/50 mmHg (03/26 0815) SpO2:  [99 %-100 %] 100 % (03/26 0815) Weight change:   Intake/Output from previous day: 03/25 0701 - 03/26 0700 In: 700 [P.O.:700] Out: 1975 [Urine:1975]  Intake/Output from this shift:    Medications: Current Facility-Administered Medications  Medication Dose Route Frequency Provider Last Rate Last Dose  . acetaminophen (TYLENOL) tablet 650 mg  650 mg Oral Q4H PRN Herbert Deaner, MD      . antiseptic oral rinse (CPC / CETYLPYRIDINIUM CHLORIDE 0.05%) solution 7 mL  7 mL Mouth Rinse BID Darlin Coco, MD   7 mL at 11/02/14 0921  . aspirin EC tablet 81 mg  81 mg Oral Daily Pixie Casino, MD   81 mg at 11/02/14 0920  . atorvastatin (LIPITOR) tablet 40 mg  40 mg Oral q1800 Herbert Deaner, MD   40 mg at 11/01/14 1803  . clopidogrel (PLAVIX) tablet 75 mg  75 mg Oral Daily Pixie Casino, MD   75 mg at 11/02/14 0920  . ferrous sulfate tablet 325 mg  325 mg Oral Daily Pixie Casino, MD   325 mg at 11/02/14 0920  . furosemide (LASIX) injection 40 mg  40 mg Intravenous BID Pixie Casino, MD   40 mg at 11/02/14 0900  . heparin injection 5,000 Units  5,000 Units Subcutaneous Q12H Herbert Deaner, MD   5,000 Units at 11/02/14 0900  . hydrALAZINE (APRESOLINE) tablet 25 mg  25 mg Oral 3 times per day Herbert Deaner, MD   25 mg at 11/02/14 0610  . insulin aspart (novoLOG) injection 0-15 Units  0-15 Units Subcutaneous TID WC Herbert Deaner, MD   2 Units at 11/02/14 0900  . insulin aspart (novoLOG) injection 0-5 Units  0-5 Units Subcutaneous QHS Herbert Deaner, MD   0 Units at 10/31/14 0030  . levothyroxine (SYNTHROID, LEVOTHROID) tablet 25  mcg  25 mcg Oral QAC breakfast Herbert Deaner, MD   25 mcg at 11/02/14 0900  . lisinopril (PRINIVIL,ZESTRIL) tablet 20 mg  20 mg Oral Daily Pixie Casino, MD   20 mg at 11/02/14 0920  . ondansetron (ZOFRAN) injection 4 mg  4 mg Intravenous Q6H PRN Herbert Deaner, MD      . spironolactone (ALDACTONE) tablet 25 mg  25 mg Oral Daily Herbert Deaner, MD   25 mg at 11/02/14 0920    Physical Exam: General appearance: alert and no distress Neck: no carotid bruit and no JVD Lungs: diminished breath sounds bibasilar Heart: regular rate and rhythm Abdomen: soft, non-tender; bowel sounds normal; no masses,  no organomegaly Extremities: extremities normal, atraumatic, no cyanosis or edema Pulses: 2+ and symmetric Skin: Skin color, texture, turgor normal. No rashes or lesions Neurologic: Grossly normal Psych: Mild, pleasant dementia  Lab Results: Results for orders placed or performed during the hospital encounter of 10/30/14 (from the past 48 hour(s))  Glucose, capillary     Status: Abnormal   Collection Time: 10/31/14 12:26 PM  Result Value Ref Range   Glucose-Capillary 189 (H) 70 - 99 mg/dL   Comment 1 Capillary Specimen   Glucose, capillary  Status: Abnormal   Collection Time: 10/31/14  4:31 PM  Result Value Ref Range   Glucose-Capillary 204 (H) 70 - 99 mg/dL  Basic metabolic panel     Status: Abnormal   Collection Time: 10/31/14  7:30 PM  Result Value Ref Range   Sodium 134 (L) 135 - 145 mmol/L   Potassium 3.5 3.5 - 5.1 mmol/L   Chloride 95 (L) 96 - 112 mmol/L   CO2 27 19 - 32 mmol/L   Glucose, Bld 213 (H) 70 - 99 mg/dL   BUN 12 6 - 23 mg/dL   Creatinine, Ser 1.15 (H) 0.50 - 1.10 mg/dL   Calcium 7.6 (L) 8.4 - 10.5 mg/dL   GFR calc non Af Amer 45 (L) >90 mL/min   GFR calc Af Amer 52 (L) >90 mL/min    Comment: (NOTE) The eGFR has been calculated using the CKD EPI equation. This calculation has not been validated in all clinical situations. eGFR's persistently <90 mL/min signify  possible Chronic Kidney Disease.    Anion gap 12 5 - 15  Glucose, capillary     Status: Abnormal   Collection Time: 10/31/14  9:40 PM  Result Value Ref Range   Glucose-Capillary 180 (H) 70 - 99 mg/dL   Comment 1 Capillary Specimen   Basic metabolic panel     Status: Abnormal   Collection Time: 11/01/14  3:20 AM  Result Value Ref Range   Sodium 136 135 - 145 mmol/L   Potassium 4.2 3.5 - 5.1 mmol/L   Chloride 98 96 - 112 mmol/L   CO2 29 19 - 32 mmol/L   Glucose, Bld 106 (H) 70 - 99 mg/dL   BUN 12 6 - 23 mg/dL   Creatinine, Ser 1.02 0.50 - 1.10 mg/dL   Calcium 7.9 (L) 8.4 - 10.5 mg/dL   GFR calc non Af Amer 52 (L) >90 mL/min   GFR calc Af Amer 60 (L) >90 mL/min    Comment: (NOTE) The eGFR has been calculated using the CKD EPI equation. This calculation has not been validated in all clinical situations. eGFR's persistently <90 mL/min signify possible Chronic Kidney Disease.    Anion gap 9 5 - 15  Brain natriuretic peptide     Status: Abnormal   Collection Time: 11/01/14  7:33 AM  Result Value Ref Range   B Natriuretic Peptide 583.7 (H) 0.0 - 100.0 pg/mL  Glucose, capillary     Status: Abnormal   Collection Time: 11/01/14 11:25 AM  Result Value Ref Range   Glucose-Capillary 168 (H) 70 - 99 mg/dL   Comment 1 Capillary Specimen   Glucose, capillary     Status: Abnormal   Collection Time: 11/01/14  4:29 PM  Result Value Ref Range   Glucose-Capillary 136 (H) 70 - 99 mg/dL   Comment 1 Capillary Specimen   Glucose, capillary     Status: Abnormal   Collection Time: 11/01/14  5:17 PM  Result Value Ref Range   Glucose-Capillary 143 (H) 70 - 99 mg/dL   Comment 1 Capillary Specimen   Glucose, capillary     Status: Abnormal   Collection Time: 11/01/14  9:26 PM  Result Value Ref Range   Glucose-Capillary 143 (H) 70 - 99 mg/dL   Comment 1 Capillary Specimen   Comprehensive metabolic panel     Status: Abnormal   Collection Time: 11/02/14  2:45 AM  Result Value Ref Range   Sodium  137 135 - 145 mmol/L   Potassium 4.3 3.5 - 5.1  mmol/L   Chloride 101 96 - 112 mmol/L   CO2 30 19 - 32 mmol/L   Glucose, Bld 139 (H) 70 - 99 mg/dL   BUN 12 6 - 23 mg/dL   Creatinine, Ser 1.04 0.50 - 1.10 mg/dL   Calcium 7.8 (L) 8.4 - 10.5 mg/dL   Total Protein 5.5 (L) 6.0 - 8.3 g/dL   Albumin 3.3 (L) 3.5 - 5.2 g/dL   AST 36 0 - 37 U/L   ALT 46 (H) 0 - 35 U/L   Alkaline Phosphatase 66 39 - 117 U/L   Total Bilirubin 0.9 0.3 - 1.2 mg/dL   GFR calc non Af Amer 50 (L) >90 mL/min   GFR calc Af Amer 59 (L) >90 mL/min    Comment: (NOTE) The eGFR has been calculated using the CKD EPI equation. This calculation has not been validated in all clinical situations. eGFR's persistently <90 mL/min signify possible Chronic Kidney Disease.    Anion gap 6 5 - 15  Magnesium     Status: None   Collection Time: 11/02/14  2:45 AM  Result Value Ref Range   Magnesium 2.3 1.5 - 2.5 mg/dL    Imaging: No results found.  Assessment:  Principal Problem:   Torsades de pointes Active Problems:   Hypertension   High cholesterol   Type II diabetes mellitus   Acute systolic CHF (congestive heart failure)   CAD (coronary artery disease)   Mobitz type 2 second degree atrioventricular block   Complete heart block   AICD (automatic cardioverter/defibrillator) present   Plan:  1. Pause-dependent VT - possibly exacerbated by amiodarone and hypokalemia. Continue electrolyte repletion. Monitoring for recovery of heart block - remains bradycardic. Likely to need CRT-D upgrade early next week. 2. Acute systolic congestive heart failure - breathing has improved with diuresis. Switch to po diuretics today. 3. HTN - bp generally well-controlled. Continue current medications. 4. DM2- am BG was 139 this am. Continue current treatment.  Time Spent Directly with Patient:  30 minutes  Length of Stay:  LOS: 3 days   Pixie Casino, MD, [facility] Attending Cardiologist [facility]  HILTY,Kenneth  C 11/02/2014, 9:47 AM

## 2014-11-03 LAB — GLUCOSE, CAPILLARY
Glucose-Capillary: 118 mg/dL — ABNORMAL HIGH (ref 70–99)
Glucose-Capillary: 161 mg/dL — ABNORMAL HIGH (ref 70–99)
Glucose-Capillary: 156 mg/dL — ABNORMAL HIGH (ref 70–99)
Glucose-Capillary: 118 mg/dL — ABNORMAL HIGH (ref 70–99)

## 2014-11-03 MED ORDER — SODIUM CHLORIDE 0.9 % IV SOLN
INTRAVENOUS | Status: DC
  Administered 2014-11-03: 20:00:00 via INTRAVENOUS

## 2014-11-03 MED ORDER — HYDRALAZINE HCL 50 MG PO TABS
50.0000 mg | ORAL_TABLET | Freq: Three times a day (TID) | ORAL | Status: DC
  Administered 2014-11-03 – 2014-11-06 (×10): 50 mg via ORAL
  Filled 2014-11-03 (×12): qty 1

## 2014-11-03 MED ORDER — CHLORHEXIDINE GLUCONATE 4 % EX LIQD
60.0000 mL | Freq: Once | CUTANEOUS | Status: AC
  Administered 2014-11-03: 4 via TOPICAL
  Filled 2014-11-03: qty 15

## 2014-11-03 MED ORDER — ISOSORBIDE MONONITRATE ER 30 MG PO TB24
30.0000 mg | ORAL_TABLET | Freq: Every day | ORAL | Status: DC
  Administered 2014-11-03 – 2014-11-06 (×4): 30 mg via ORAL
  Filled 2014-11-03 (×4): qty 1

## 2014-11-03 NOTE — Progress Notes (Signed)
Patient Name: [name]      SUBJECTIVE:feels better this am  Had discussion with husband and patient trying to sort out whether the months of "slow heart rate" at home have been symptomatic  They are both pretty conviced taht that is true  Does not remember me  Past Medical History  Diagnosis Date  . Coronary artery disease     a. 2009 CABG x 3(LIMA->LAD, VG->RAMUS, VG->OM;  b. 04/2012 Cath/PCI: LM nl, LAD 50-70p,7972m, RI 110656m, LCX occluded OM's, RCA 6341m, 30-40d, PDA 70p, VG->RI nl, VG->OM nl, LIMA->LAD nl, EF 25-30%. RCA stented w/ 2.25x28 & 2.5x28mm Xpedition DES'.   . Hypertension   . Hyperlipidemia   . Type II diabetes mellitus   . Alzheimer's dementia   . Chronic systolic CHF (congestive heart failure)     a. 04/2012 EF 25-30%, viable myocardium by rest thallium redistribution     Scheduled Meds:  Scheduled Meds: . antiseptic oral rinse  7 mL Mouth Rinse BID  . aspirin EC  81 mg Oral Daily  . atorvastatin  40 mg Oral q1800  . clopidogrel  75 mg Oral Daily  . ferrous sulfate  325 mg Oral Daily  . furosemide  40 mg Oral Daily  . heparin  5,000 Units Subcutaneous Q12H  . hydrALAZINE  25 mg Oral 3 times per day  . insulin aspart  0-15 Units Subcutaneous TID WC  . insulin aspart  0-5 Units Subcutaneous QHS  . levothyroxine  25 mcg Oral QAC breakfast  . lisinopril  20 mg Oral Daily  . spironolactone  25 mg Oral Daily   Continuous Infusions:  acetaminophen, ondansetron (ZOFRAN) IV    PHYSICAL EXAM Filed Vitals:   11/03/14 0400 11/03/14 0644 11/03/14 0800 11/03/14 1155  BP: 157/104 144/36 155/60 162/74  Pulse: 79   44  Temp: 98.2 F (36.8 C)  97.9 F (36.6 C) 98.2 F (36.8 C)  TempSrc: Oral  Oral Oral  Resp: 17  18 15   Height:      Weight:      SpO2: 100%  100% 100%    Well developed and nourished in no acute distress HENT normal Neck supple Clear Regular rate and rhythm, no murmurs or gallops Abd-soft with active BS No Clubbing cyanosis  edema Skin-warm and dry A & Oriented  Grossly normal sensory and motor function   TELEMETRY: Reviewed telemetry pt in *no vt contnued  intermittent high grade block    Intake/Output Summary (Last 24 hours) at 11/03/14 1308 Last data filed at 11/03/14 1033  Gross per 24 hour  Intake    220 ml  Output    775 ml  Net   -555 ml    LABS: Basic Metabolic Panel:  Recent Labs Lab 10/31/14 0052 10/31/14 0350 10/31/14 1930 11/01/14 0320 11/02/14 0245  NA 134* 137 134* 136 137  K 3.1* 3.3* 3.5 4.2 4.3  CL 94* 99 95* 98 101  CO2 28 27 27 29 30   GLUCOSE 351* 229* 213* 106* 139*  BUN 9 11 12 12 12   CREATININE 0.98 0.92 1.15* 1.02 1.04  CALCIUM 7.6* 7.5* 7.6* 7.9* 7.8*  MG 1.5  --   --   --  2.3   Cardiac Enzymes: No results for input(s): CKTOTAL, CKMB, CKMBINDEX, TROPONINI in the last 72 hours. CBC:  Recent Labs Lab 10/31/14 0350  WBC 4.8  HGB 11.5*  HCT 35.3*  MCV 88.7  PLT 188  ASSESSMENT AND PLAN:  Principal Problem:   Torsades de pointes Active Problems:   Hypertension   High cholesterol   Type II diabetes mellitus   Acute systolic CHF (congestive heart failure)   CAD (coronary artery disease)   Mobitz type 2 second degree atrioventricular block   Complete heart block   AICD (automatic cardioverter/defibrillator) present   Blood pressure remains elevated  Increase hydralazine  We have reviewed the benefits and risks of device upgrade  .  These include but are not limited to lead fracture and infection, puncture of heart and or lung  The patient and her husband understand , agree  and are willing to proceed.     Signed, Sherryl Manges MD  11/03/2014

## 2014-11-04 ENCOUNTER — Encounter (HOSPITAL_COMMUNITY)
Admission: EM | Disposition: A | Discharge status: Home Health | Payer: Self-pay | Source: Other Acute Inpatient Hospital | Attending: Cardiology

## 2014-11-04 ENCOUNTER — Encounter (HOSPITAL_COMMUNITY): Payer: Self-pay | Admitting: Internal Medicine

## 2014-11-04 DIAGNOSIS — I2510 Atherosclerotic heart disease of native coronary artery without angina pectoris: Secondary | ICD-10-CM

## 2014-11-04 DIAGNOSIS — I255 Ischemic cardiomyopathy: Secondary | ICD-10-CM

## 2014-11-04 DIAGNOSIS — I509 Heart failure, unspecified: Secondary | ICD-10-CM

## 2014-11-04 DIAGNOSIS — Z4502 Encounter for adjustment and management of automatic implantable cardiac defibrillator: Secondary | ICD-10-CM

## 2014-11-04 HISTORY — PX: BI-VENTRICULAR IMPLANTABLE CARDIOVERTER DEFIBRILLATOR UPGRADE: SHX5461

## 2014-11-04 LAB — GLUCOSE, CAPILLARY
Glucose-Capillary: 110 mg/dL — ABNORMAL HIGH (ref 70–99)
Glucose-Capillary: 111 mg/dL — ABNORMAL HIGH (ref 70–99)
Glucose-Capillary: 106 mg/dL — ABNORMAL HIGH (ref 70–99)
Glucose-Capillary: 185 mg/dL — ABNORMAL HIGH (ref 70–99)

## 2014-11-04 SURGERY — BI-VENTRICULAR IMPLANTABLE CARDIOVERTER DEFIBRILLATOR UPGRADE
Anesthesia: LOCAL

## 2014-11-04 MED ORDER — YOU HAVE A PACEMAKER BOOK
Freq: Once | Status: AC
  Administered 2014-11-04: 21:00:00
  Filled 2014-11-04: qty 1

## 2014-11-04 MED ORDER — LIVING WELL WITH DIABETES BOOK
Freq: Once | Status: AC
  Administered 2014-11-04: 21:00:00
  Filled 2014-11-04: qty 1

## 2014-11-04 MED ORDER — SODIUM CHLORIDE 0.9 % IV SOLN: INTRAVENOUS | Status: AC

## 2014-11-04 MED ORDER — SODIUM CHLORIDE 0.9 % IR SOLN
80.0000 mg | Status: DC
  Filled 2014-11-04: qty 2

## 2014-11-04 MED ORDER — CARVEDILOL 3.125 MG PO TABS: 6.2500 mg | ORAL_TABLET | Freq: Two times a day (BID) | ORAL | Status: DC

## 2014-11-04 MED ORDER — ACETAMINOPHEN 325 MG PO TABS
325.0000 mg | ORAL_TABLET | ORAL | Status: DC | PRN
  Administered 2014-11-04 (×2): 650 mg via ORAL
  Filled 2014-11-04 (×2): qty 2

## 2014-11-04 MED ORDER — HEPARIN (PORCINE) IN NACL 2-0.9 UNIT/ML-% IJ SOLN
INTRAMUSCULAR | Status: AC
Start: 2014-11-04 — End: 2014-11-04
  Filled 2014-11-04: qty 500

## 2014-11-04 MED ORDER — CHLORHEXIDINE GLUCONATE 4 % EX LIQD
60.0000 mL | Freq: Once | CUTANEOUS | Status: AC
  Administered 2014-11-04: 4 via TOPICAL
  Filled 2014-11-04: qty 60

## 2014-11-04 MED ORDER — CEFAZOLIN SODIUM 1-5 GM-% IV SOLN
1.0000 g | Freq: Four times a day (QID) | INTRAVENOUS | Status: AC
  Administered 2014-11-04 – 2014-11-05 (×3): 1 g via INTRAVENOUS
  Filled 2014-11-04 (×3): qty 50

## 2014-11-04 MED ORDER — ONDANSETRON HCL 4 MG/2ML IJ SOLN: 4.0000 mg | Freq: Four times a day (QID) | INTRAMUSCULAR | Status: DC | PRN

## 2014-11-04 MED ORDER — MIDAZOLAM HCL 5 MG/5ML IJ SOLN
INTRAMUSCULAR | Status: AC
  Filled 2014-11-04: qty 5

## 2014-11-04 MED ORDER — LIDOCAINE HCL (PF) 1 % IJ SOLN
INTRAMUSCULAR | Status: AC
  Filled 2014-11-04: qty 30

## 2014-11-04 MED ORDER — FENTANYL CITRATE 0.05 MG/ML IJ SOLN
INTRAMUSCULAR | Status: AC
  Filled 2014-11-04: qty 2

## 2014-11-04 MED ORDER — CEFAZOLIN SODIUM-DEXTROSE 2-3 GM-% IV SOLR
2.0000 g | INTRAVENOUS | Status: DC
  Filled 2014-11-04: qty 50

## 2014-11-04 NOTE — CV Procedure (Signed)
[name] [id number]  [id number]  Preop Dx: complete heart block with brady dependent torsades; previoulsy implanted single chamber ICD Postop Dx same/   Procedure:generator removal, pocket revision RA lead insertion LV lead insertion generator new insertion  Cx: None   EBL: 25-50  Dictation number [id number]  Sherryl MangesSteven Klein, MD 11/04/2014 4:44 PM

## 2014-11-04 NOTE — H&P (View-Only) (Signed)
     Patient Name: [name]      SUBJECTIVE:feels better this am  Had discussion with husband and patient trying to sort out whether the months of "slow heart rate" at home have been symptomatic  They are both pretty conviced taht that is true  Does not remember me  Past Medical History  Diagnosis Date  . Coronary artery disease     a. 2009 CABG x 3(LIMA->LAD, VG->RAMUS, VG->OM;  b. 04/2012 Cath/PCI: LM nl, LAD 50-70p,70m, RI 100m, LCX occluded OM's, RCA 95m, 30-40d, PDA 70p, VG->RI nl, VG->OM nl, LIMA->LAD nl, EF 25-30%. RCA stented w/ 2.25x28 & 2.5x8mm Xpedition DES'.   . Hypertension   . Hyperlipidemia   . Type II diabetes mellitus   . Alzheimer's dementia   . Chronic systolic CHF (congestive heart failure)     a. 04/2012 EF 25-30%, viable myocardium by rest thallium redistribution     Scheduled Meds:  Scheduled Meds: . antiseptic oral rinse  7 mL Mouth Rinse BID  . aspirin EC  81 mg Oral Daily  . atorvastatin  40 mg Oral q1800  . clopidogrel  75 mg Oral Daily  . ferrous sulfate  325 mg Oral Daily  . furosemide  40 mg Oral Daily  . heparin  5,000 Units Subcutaneous Q12H  . hydrALAZINE  25 mg Oral 3 times per day  . insulin aspart  0-15 Units Subcutaneous TID WC  . insulin aspart  0-5 Units Subcutaneous QHS  . levothyroxine  25 mcg Oral QAC breakfast  . lisinopril  20 mg Oral Daily  . spironolactone  25 mg Oral Daily   Continuous Infusions:  acetaminophen, ondansetron (ZOFRAN) IV    PHYSICAL EXAM Filed Vitals:   11/03/14 0400 11/03/14 0644 11/03/14 0800 11/03/14 1155  BP: 157/104 144/36 155/60 162/74  Pulse: 79   44  Temp: 98.2 F (36.8 C)  97.9 F (36.6 C) 98.2 F (36.8 C)  TempSrc: Oral  Oral Oral  Resp: 17  18 15  Height:      Weight:      SpO2: 100%  100% 100%    Well developed and nourished in no acute distress HENT normal Neck supple Clear Regular rate and rhythm, no murmurs or gallops Abd-soft with active BS No Clubbing cyanosis  edema Skin-warm and dry A & Oriented  Grossly normal sensory and motor function   TELEMETRY: Reviewed telemetry pt in *no vt contnued  intermittent high grade block    Intake/Output Summary (Last 24 hours) at 11/03/14 1308 Last data filed at 11/03/14 1033  Gross per 24 hour  Intake    220 ml  Output    775 ml  Net   -555 ml    LABS: Basic Metabolic Panel:  Recent Labs Lab 10/31/14 0052 10/31/14 0350 10/31/14 1930 11/01/14 0320 11/02/14 0245  NA 134* 137 134* 136 137  K 3.1* 3.3* 3.5 4.2 4.3  CL 94* 99 95* 98 101  CO2 28 27 27 29 30  GLUCOSE 351* 229* 213* 106* 139*  BUN 9 11 12 12 12  CREATININE 0.98 0.92 1.15* 1.02 1.04  CALCIUM 7.6* 7.5* 7.6* 7.9* 7.8*  MG 1.5  --   --   --  2.3   Cardiac Enzymes: No results for input(s): CKTOTAL, CKMB, CKMBINDEX, TROPONINI in the last 72 hours. CBC:  Recent Labs Lab 10/31/14 0350  WBC 4.8  HGB 11.5*  HCT 35.3*  MCV 88.7  PLT 188        ASSESSMENT AND PLAN:  Principal Problem:   Torsades de pointes Active Problems:   Hypertension   High cholesterol   Type II diabetes mellitus   Acute systolic CHF (congestive heart failure)   CAD (coronary artery disease)   Mobitz type 2 second degree atrioventricular block   Complete heart block   AICD (automatic cardioverter/defibrillator) present   Blood pressure remains elevated  Increase hydralazine  We have reviewed the benefits and risks of device upgrade  .  These include but are not limited to lead fracture and infection, puncture of heart and or lung  The patient and her husband understand , agree  and are willing to proceed.     Signed, Krisi Azua MD  11/03/2014   

## 2014-11-04 NOTE — Progress Notes (Signed)
IV team  Came to restart iv line as requested by md but pt refused. EP lab staff made aware to relay to dr. Graciela HusbandsKlein.

## 2014-11-04 NOTE — Progress Notes (Signed)
Transferred to EP lab. Ancef and garamycin irrigation endorsed to the staff.

## 2014-11-04 NOTE — Interval H&P Note (Signed)
ICD Criteria  Current LVEF:25% ;Obtained > 6 months ago.   NYHA Functional Classification: Class III  Heart Failure History:  Yes, Duration of heart failure since onset is > 9 months  Non-Ischemic Dilated Cardiomyopathy History:  No.  Atrial Fibrillation/Atrial Flutter:  No.  Ventricular Tachycardia History:  Yes, Hemodynamic instability present, VT Type:  SVT - Polymorphic.  Cardiac Arrest History:  No  History of Syndromes with Risk of Sudden Death:  No.  Previous ICD:  Yes, ICD Type:  Single, Reason for ICD:  Primary prevention.  25%  Electrophysiology Study: No.  Prior MI: No.  PPM: No.  OSA:  No  Patient Life Expectancy of >=1 year: Yes.  Anticoagulation Therapy:  Patient is NOT on anticoagulation therapy.   Beta Blocker Therapy:  No, Reason not on Beta Blocker therapy: bradycardia  Ace Inhibitor/ARB Therapy:  Yes.History and Physical Interval Note:  11/04/2014 12:23 PM  [name]  has presented today for surgery, with the diagnosis of VT  The various methods of treatment have been discussed with the patient and family. After consideration of risks, benefits and other options for treatment, the patient has consented to  Procedure(s): BI-VENTRICULAR IMPLANTABLE CARDIOVERTER DEFIBRILLATOR UPGRADE (N/A) as a surgical intervention .  The patient's history has been reviewed, patient examined, no change in status, stable for surgery.  I have reviewed the patient's chart and labs.  Questions were answered to the patient's satisfaction.     Sherryl MangesSteven Klein

## 2014-11-04 NOTE — Progress Notes (Signed)
Cath. Lab claimed that pt is not coming back to [facility]. Husband made aware, belongings taken by him.

## 2014-11-04 NOTE — Progress Notes (Signed)
Subjective:  No CP. SOB improved  Objective:  Temp:  [97.5 F (36.4 C)-98.4 F (36.9 C)] 97.6 F (36.4 C) (03/28 0759) Pulse Rate:  [39-58] 58 (03/28 0759) Resp:  [14-21] 15 (03/28 0759) BP: (136-168)/(50-99) 162/68 mmHg (03/28 0759) SpO2:  [95 %-100 %] 98 % (03/28 0759) Weight change:   Intake/Output from previous day: 03/27 0701 - 03/28 0700 In: 1130 [P.O.:220; I.V.:910] Out: 1675 [Urine:1675]  Intake/Output from this shift: Total I/O In: 200 [I.V.:200] Out: -   Physical Exam: General appearance: alert and no distress Neck: no adenopathy, no carotid bruit, no JVD, supple, symmetrical, trachea midline and thyroid not enlarged, symmetric, no tenderness/mass/nodules Lungs: clear to auscultation bilaterally Heart: Bradycardic Extremities: extremities normal, atraumatic, no cyanosis or edema  Lab Results: Results for orders placed or performed during the hospital encounter of 10/30/14 (from the past 48 hour(s))  Glucose, capillary     Status: Abnormal   Collection Time: 11/02/14 11:31 AM  Result Value Ref Range   Glucose-Capillary 156 (H) 70 - 99 mg/dL   Comment 1 Capillary Specimen   Glucose, capillary     Status: Abnormal   Collection Time: 11/02/14  5:11 PM  Result Value Ref Range   Glucose-Capillary 149 (H) 70 - 99 mg/dL  Glucose, capillary     Status: Abnormal   Collection Time: 11/02/14  9:51 PM  Result Value Ref Range   Glucose-Capillary 106 (H) 70 - 99 mg/dL   Comment 1 Capillary Specimen   Glucose, capillary     Status: Abnormal   Collection Time: 11/03/14  8:08 AM  Result Value Ref Range   Glucose-Capillary 118 (H) 70 - 99 mg/dL   Comment 1 Capillary Specimen   Glucose, capillary     Status: Abnormal   Collection Time: 11/03/14 11:53 AM  Result Value Ref Range   Glucose-Capillary 161 (H) 70 - 99 mg/dL   Comment 1 Capillary Specimen   Glucose, capillary     Status: Abnormal   Collection Time: 11/03/14  4:47 PM  Result Value Ref Range   Glucose-Capillary 156 (H) 70 - 99 mg/dL   Comment 1 Capillary Specimen   Glucose, capillary     Status: Abnormal   Collection Time: 11/03/14  9:16 PM  Result Value Ref Range   Glucose-Capillary 118 (H) 70 - 99 mg/dL   Comment 1 Capillary Specimen   Glucose, capillary     Status: Abnormal   Collection Time: 11/04/14  7:59 AM  Result Value Ref Range   Glucose-Capillary 110 (H) 70 - 99 mg/dL   Comment 1 Capillary Specimen     Imaging: Imaging results have been reviewed  TeleHuston Foley with 2nd degree HB  Assessment/Plan:   1. Principal Problem: 2.   Torsades de pointes 3. Active Problems: 4.   Hypertension 5.   High cholesterol 6.   Type II diabetes mellitus 7.   Acute systolic CHF (congestive heart failure) 8.   CAD (coronary artery disease) 9.   Mobitz type 2 second degree atrioventricular block 10.   Complete heart block 11.   AICD (automatic cardioverter/defibrillator) present 12.   Time Spent Directly with Patient:  20 minutes  Length of Stay:  LOS: 5 days   1. ISCM- H/O Remote CABG with PCI/Stent native RCA by Dr. Riley Kill 2013. EF 25-30%. H/O ICD implanted in [place]. BNP mildly elevated at 580. On PO lasix. Plan for CRT BiV upgrade by Dr. Graciela Husbands today. Meds include imdur, hydralazine, Spironolactone. Coreg is on hold.  [name],[name] 11/04/2014, 10:02 AM

## 2014-11-05 ENCOUNTER — Inpatient Hospital Stay (HOSPITAL_COMMUNITY): Payer: Medicare FFS

## 2014-11-05 LAB — GLUCOSE, CAPILLARY
GLUCOSE-CAPILLARY: 159 mg/dL — AB (ref 70–99)
Glucose-Capillary: 107 mg/dL — ABNORMAL HIGH (ref 70–99)
Glucose-Capillary: 139 mg/dL — ABNORMAL HIGH (ref 70–99)
Glucose-Capillary: 127 mg/dL — ABNORMAL HIGH (ref 70–99)

## 2014-11-05 MED ORDER — CARVEDILOL 12.5 MG PO TABS
12.5000 mg | ORAL_TABLET | Freq: Two times a day (BID) | ORAL | Status: DC
  Administered 2014-11-05 – 2014-11-06 (×3): 12.5 mg via ORAL
  Filled 2014-11-05 (×3): qty 1

## 2014-11-05 NOTE — Progress Notes (Signed)
Patient Name: [name]      SUBJECTIVE:f  S/p CRT upgrade yesterday  Without chest pain and no sob  e  Past Medical History  Diagnosis Date  . Coronary artery disease     a. 2009 CABG x 3(LIMA->LAD, VG->RAMUS, VG->OM;  b. 04/2012 Cath/PCI: LM nl, LAD 50-70p,3793m, RI 15931m, LCX occluded OM's, RCA 3962m, 30-40d, PDA 70p, VG->RI nl, VG->OM nl, LIMA->LAD nl, EF 25-30%. RCA stented w/ 2.25x28 & 2.5x628mm Xpedition DES'.   . Hypertension   . Hyperlipidemia   . Type II diabetes mellitus   . Alzheimer's dementia   . Chronic systolic CHF (congestive heart failure)     a. 04/2012 EF 25-30%, viable myocardium by rest thallium redistribution     Scheduled Meds:  Scheduled Meds: . antiseptic oral rinse  7 mL Mouth Rinse BID  . aspirin EC  81 mg Oral Daily  . atorvastatin  40 mg Oral q1800  . carvedilol  6.25 mg Oral BID WC  .  ceFAZolin (ANCEF) IV  1 g Intravenous Q6H  . clopidogrel  75 mg Oral Daily  . ferrous sulfate  325 mg Oral Daily  . furosemide  40 mg Oral Daily  . heparin  5,000 Units Subcutaneous Q12H  . hydrALAZINE  50 mg Oral 3 times per day  . insulin aspart  0-15 Units Subcutaneous TID WC  . insulin aspart  0-5 Units Subcutaneous QHS  . isosorbide mononitrate  30 mg Oral Daily  . levothyroxine  25 mcg Oral QAC breakfast  . lisinopril  20 mg Oral Daily  . spironolactone  25 mg Oral Daily   Continuous Infusions:  acetaminophen, ondansetron (ZOFRAN) IV    PHYSICAL EXAM Filed Vitals:   11/04/14 2331 11/05/14 0412 11/05/14 0500 11/05/14 0600  BP: 169/89 170/74 139/87 187/84  Pulse: 77 77    Temp: 97.6 F (36.4 C) 98 F (36.7 C)    TempSrc: Oral Oral    Resp: 20 17 16 15   Height:      Weight:  181 lb 14.1 oz (82.5 kg)    SpO2: 99% 99% 99%     Well developed and nourished in no acute distress HENT normal Neck supple Clear Regular rate and rhythm, no murmurs or gallops Wound without hematoma Abd-soft with active BS No Clubbing cyanosis  edema Skin-warm and dry A & Oriented  Grossly normal sensory and motor function   TELEMETRY: Reviewed telemetry /P-synchronous/ AV  pacing     Intake/Output Summary (Last 24 hours) at 11/05/14 0822 Last data filed at 11/05/14 0700  Gross per 24 hour  Intake    500 ml  Output   1700 ml  Net  -1200 ml    LABS: Basic Metabolic Panel:  Recent Labs Lab 10/31/14 0052 10/31/14 0350 10/31/14 1930 11/01/14 0320 11/02/14 0245  NA 134* 137 134* 136 137  K 3.1* 3.3* 3.5 4.2 4.3  CL 94* 99 95* 98 101  CO2 28 27 27 29 30   GLUCOSE 351* 229* 213* 106* 139*  BUN 9 11 12 12 12   CREATININE 0.98 0.92 1.15* 1.02 1.04  CALCIUM 7.6* 7.5* 7.6* 7.9* 7.8*  MG 1.5  --   --   --  2.3   Cardiac Enzymes: No results for input(s): CKTOTAL, CKMB, CKMBINDEX, TROPONINI in the last 72 hours. CBC:  Recent Labs Lab 10/31/14 0350  WBC 4.8  HGB 11.5*  HCT 35.3*  MCV 88.7  PLT 188  ASSESSMENT AND PLAN:  Principal Problem:   Torsades de pointes Active Problems:   Hypertension   High cholesterol   Type II diabetes mellitus   Acute systolic CHF (congestive heart failure)   CAD (coronary artery disease)   Mobitz type 2 second degree atrioventricular block   Complete heart block   AICD (automatic cardioverter/defibrillator) present   BP still elevated will further increase coreg  Feels better Ambulate today and home tomorrow D/c iron   Signed, Sherryl Manges MD  11/05/2014

## 2014-11-05 NOTE — Progress Notes (Signed)
CARDIAC REHAB PHASE I   PRE:  Rate/Rhythm: paced 80  BP:  Supine:   Sitting: 174/70  Standing:    SaO2:   MODE:  Ambulation: 120 ft   POST:  Rate/Rhythm: 112  BP:  Supine: 187/81  Sitting:   Standing:    SaO2:  [id number] Pt hesitant to walk. Stated cold. Told pt she could go back to bed after walk and got her another gown as jacket. Pt stated she uses cane at home but I did not find one in room. Walked as asst x 2 since pt had not walked in days and afraid she would put too much pressure on arm with pacer. Pt walked 120 ft with hand held asst x 2 with slow pace. Slightly unsteady. Denied dizziness. Tried BSC prior to walk. To bed and several blankets applied. Needs to increase activity. Would benefit from PT consult.   Kelly Nuttingharlene Fain Francis, RN BSN  11/05/2014 9:45 AM

## 2014-11-05 NOTE — Op Note (Signed)
NAMBernadene Person:  [name], [name]                ACCOUNT NO.:  [id number]  MEDICAL RECORD NO.:  [id number]  LOCATION:  6C08C                        FACILITY:  [facility]  PHYSICIAN:  Duke SalviaSteven C. Keesha Pellum, MD, FACCDATE OF BIRTH:  1934/12/27  DATE OF PROCEDURE:  11/04/2014 DATE OF DISCHARGE:                              OPERATIVE REPORT   PREOPERATIVE DIAGNOSES:  Ischemic cardiomyopathy with complete heart block and bradycardia-dependent polymorphic ventricular tachycardia with a previously implanted single-chamber defibrillator.  POSTOPERATIVE DIAGNOSES:  Ischemic cardiomyopathy with complete heart block and bradycardia-dependent polymorphic ventricular tachycardia with a previously implanted single-chamber defibrillator.  PROCEDURE:  Generator replacement, right atrial lead insertion, left ventricular lead insertion, new defibrillator insertion, and pocket revision.  DESCRIPTION OF PROCEDURE:  Following obtaining informed consent, the patient was brought to electrophysiology laboratory and placed on the fluoroscopic table in a supine position.  After routine prep and drape of the left upper chest, lidocaine was infiltrated along the line of the previous incision.  It was carried down to layer of device pocket using sharp dissection and electrocautery.  The pocket was opened and the device was explanted and because of the patient's bradycardia, the patient's device was left intact as backup bradycardia pacing.  Attention was then turned to gain access to the extrathoracic left subclavian vein, which was accomplished without difficulty without the aspiration of air or puncture of the artery.  Two separate venipunctures were accomplished.  Guidewires were placed and retained, and sequentially 9.5 and 7-French sheaths were placed, which were passed.  A [employer] Jude 135 CS coronary sinus cannulation catheter and a Medtronic 5076 atrial lead, serial number [id number].  The coronary sinus was cannulated without  difficulty, and a nonocclusive venogram demonstrated a midlateral branch which was targeted, wired easily and then deployed was a Medtronic 4598LV quadripolar lead, serial number [id number].  It was positioned to the junction between the mid and distal third and the second and third poles were interrogated where the amplitude was 2.5 but the pace impedance was 438 and threshold was 0.6 V at 0.5 milliseconds.  The 9.5-French sheath was removed and the 135 sheath was left in place.  We then turned our attention to deploying the right atrial lead.  This turned out to be very difficult.  I think there were two challenges, one was that there was a significant amount of scarring at the site of the right atrial sheath insertion as well as there was some flaring of the distal portion of the right atrial lead, which turned out to be sheath during attempts to deploy the lead.  I could not get the lead to extrude past the end of the sheath.  I initially thought this was an issue of stenosis in the innominate vein, so we put a long sheath in.  This failed to resolve the issue.  We then ultimately after multiple attempts at the above, we took a new atrial lead which is the one mentioned above, and it passed easily through the sheath and was deployed to the right atrial appendage where the bipolar P-wave was 0.8 with a pace impedance of 526 and threshold 1.3 V at 0.5 milliseconds.  Given the difficulties we had had,  however, maneuvering within the atrium, we elected to leave the lead in position.  Amplitudes gradually were increasing to the 1 millivolt range at the time of sheath removal and lead securing.  We then removed the CS deployment sheath and this lead was also secured to the prepectoral fascia.  Hemostatic sheath was placed, and then the pocket was extended caudally to allow for housing of the larger device. The leads were then attached to a Medtronic [employer] CRT-D device, serial number  [id number].  Through the device, bipolar P-wave was 0.6 with a pace impedance of 399, a threshold 1 V at 0.5.  The R-wave was 10 with a pace impedance of 399, a threshold 0.5 at 0.4.  In  the LV, the 2- 3 configuration had an impedance of 361, threshold 1.5 V at 0.4 milliseconds.  High-voltage impedance was 49 ohms.  The pocket was copiously irrigated with antibiotic-containing saline solution. Hemostasis was assured.  The leads in the pulse generator were placed in the pocket.  Surgicel was placed in the pocket.  The device was secured to the prepectoral fascia.  The wound was then closed in 2 layers in normal fashion.  The wound was washed, dried, and a Dermabond dressing was applied.  Needle counts, sponge counts, and instrument counts were correct at the end of the procedure according to staff.  Estimated blood loss was about 25 mL.     Duke Salvia, MD, [facility]     SCK/MEDQ  D:  11/04/2014  T:  11/05/2014  Job:  [id number]

## 2014-11-06 ENCOUNTER — Encounter (HOSPITAL_COMMUNITY): Payer: Self-pay | Admitting: Cardiology

## 2014-11-06 DIAGNOSIS — Z95810 Presence of automatic (implantable) cardiac defibrillator: Secondary | ICD-10-CM

## 2014-11-06 DIAGNOSIS — R531 Weakness: Secondary | ICD-10-CM

## 2014-11-06 DIAGNOSIS — I255 Ischemic cardiomyopathy: Secondary | ICD-10-CM | POA: Diagnosis present

## 2014-11-06 HISTORY — DX: Presence of automatic (implantable) cardiac defibrillator: Z95.810

## 2014-11-06 LAB — GLUCOSE, CAPILLARY
GLUCOSE-CAPILLARY: 105 mg/dL — AB (ref 70–99)
GLUCOSE-CAPILLARY: 112 mg/dL — AB (ref 70–99)
Glucose-Capillary: 160 mg/dL — ABNORMAL HIGH (ref 70–99)

## 2014-11-06 MED ORDER — ACETAMINOPHEN 325 MG PO TABS: 325.0000 mg | ORAL_TABLET | ORAL | Status: AC | PRN

## 2014-11-06 MED ORDER — CARVEDILOL 12.5 MG PO TABS: 12.5000 mg | ORAL_TABLET | Freq: Two times a day (BID) | ORAL | Status: DC

## 2014-11-06 MED ORDER — LEVOTHYROXINE SODIUM 25 MCG PO TABS: 25.0000 ug | ORAL_TABLET | Freq: Every day | ORAL | Status: DC

## 2014-11-06 MED ORDER — HYDRALAZINE HCL 50 MG PO TABS: 50.0000 mg | ORAL_TABLET | Freq: Three times a day (TID) | ORAL | Status: DC

## 2014-11-06 MED ORDER — SPIRONOLACTONE 25 MG PO TABS
25.0000 mg | ORAL_TABLET | Freq: Every day | ORAL | Status: DC
Start: 2014-11-06 — End: 2015-06-21

## 2014-11-06 MED ORDER — FERROUS SULFATE 325 (65 FE) MG PO TABS: 325.0000 mg | ORAL_TABLET | Freq: Every day | ORAL | Status: DC

## 2014-11-06 MED ORDER — NITROGLYCERIN 0.4 MG SL SUBL: 0.4000 mg | SUBLINGUAL_TABLET | SUBLINGUAL | Status: DC | PRN

## 2014-11-06 MED ORDER — FUROSEMIDE 40 MG PO TABS: 40.0000 mg | ORAL_TABLET | Freq: Every day | ORAL | Status: DC

## 2014-11-06 MED ORDER — LISINOPRIL 20 MG PO TABS: 20.0000 mg | ORAL_TABLET | Freq: Every day | ORAL | Status: DC

## 2014-11-06 MED ORDER — ATORVASTATIN CALCIUM 40 MG PO TABS: 40.0000 mg | ORAL_TABLET | Freq: Every day | ORAL | Status: DC

## 2014-11-06 MED ORDER — CLOPIDOGREL BISULFATE 75 MG PO TABS: 75.0000 mg | ORAL_TABLET | Freq: Every day | ORAL | Status: DC

## 2014-11-06 MED ORDER — ISOSORBIDE MONONITRATE ER 30 MG PO TB24: 30.0000 mg | ORAL_TABLET | Freq: Every day | ORAL | Status: DC

## 2014-11-06 NOTE — Discharge Instructions (Signed)
Heart healthy diabetic diet  Weigh daily--today at discharge your weight is 175 pounds Call [phone] if weight climbs more than 3 pounds in a day or 5 pounds in a week. No salt to very little salt in your diet.  No more than 2000 mg in a day. Call if increased shortness of breath or increased swelling.  Your first visit is on [address] in [place] to check pacer site.     Supplemental Discharge Instructions for  Pacemaker/Defibrillator Patients  Activity No heavy lifting or vigorous activity with your left arm for 6 to 8 weeks.  Do not raise your left arm above your head for one week.  Gradually raise your affected arm as drawn below.           _3/30/16                          11/08/14                      11/10/14                    11/12/14 _  NO DRIVING for  6 months if you drive.  WOUND CARE - Keep the wound area clean and dry.  Do not get this area wet for one week. No showers for one week; you may shower on  11/12/14   . - The tape/steri-strips on your wound will fall off; do not pull them off.  No bandage is needed on the site.  DO  NOT apply any creams, oils, or ointments to the wound area. - If you notice any drainage or discharge from the wound, any swelling or bruising at the site, or you develop a fever > 101? F after you are discharged home, call the office at once.  Special Instructions - You are still able to use cellular telephones; use the ear opposite the side where you have your pacemaker/defibrillator.  Avoid carrying your cellular phone near your device. - When traveling through airports, show security personnel your identification card to avoid being screened in the metal detectors.  Ask the security personnel to use the hand wand. - Avoid arc welding equipment, MRI testing (magnetic resonance imaging), TENS units (transcutaneous nerve stimulators).  Call the office for questions about other devices. - Avoid electrical appliances that are in poor condition or  are not properly grounded. - Microwave ovens are safe to be near or to operate.  Additional information for defibrillator patients should your device go off: - If your device goes off ONCE and you feel fine afterward, notify the device clinic nurses. - If your device goes off ONCE and you do not feel well afterward, call 911. - If your device goes off TWICE, call 911. - If your device goes off THREE times in one day, call 911.  DO NOT DRIVE YOURSELF OR A FAMILY MEMBER WITH A DEFIBRILLATOR TO THE HOSPITAL--CALL 911.

## 2014-11-06 NOTE — Progress Notes (Signed)
   SUBJECTIVE: The patient is doing well today.   At this time, she denies chest pain, shortness of breath, or any new concerns.  Energy is slowly improving.  Marland Kitchen. antiseptic oral rinse  7 mL Mouth Rinse BID  . aspirin EC  81 mg Oral Daily  . atorvastatin  40 mg Oral q1800  . carvedilol  12.5 mg Oral BID WC  . clopidogrel  75 mg Oral Daily  . furosemide  40 mg Oral Daily  . heparin  5,000 Units Subcutaneous Q12H  . hydrALAZINE  50 mg Oral 3 times per day  . insulin aspart  0-15 Units Subcutaneous TID WC  . insulin aspart  0-5 Units Subcutaneous QHS  . isosorbide mononitrate  30 mg Oral Daily  . levothyroxine  25 mcg Oral QAC breakfast  . lisinopril  20 mg Oral Daily  . spironolactone  25 mg Oral Daily      OBJECTIVE: Physical Exam: Filed Vitals:   11/06/14 0001 11/06/14 0615 11/06/14 0806 11/06/14 0825  BP:  125/93  114/51  Pulse:  70  76  Temp:  98.5 F (36.9 C)  98.5 F (36.9 C)  TempSrc:  Oral  Oral  Resp:  15 21 18   Height:      Weight: 175 lb 11.3 oz (79.7 kg)     SpO2:  96%  97%    Intake/Output Summary (Last 24 hours) at 11/06/14 1101 Last data filed at 11/06/14 0825  Gross per 24 hour  Intake    880 ml  Output    950 ml  Net    -70 ml    Telemetry reveals sinus rhythm with BiV pacing  GEN- The patient is well appearing, alert and oriented x 3 today.   Head- normocephalic, atraumatic Eyes-  Sclera clear, conjunctiva pink Ears- hearing intact Oropharynx- clear Neck- supple,   Lungs- Clear to ausculation bilaterally, normal work of breathing Heart- Regular rate and rhythm, no murmurs, rubs or gallops, PMI not laterally displaced GI- soft, NT, ND, + BS Extremities- no clubbing, cyanosis, or edema Skin- ICD pocket is with small hematoma ASSESSMENT AND PLAN:  Principal Problem:   Torsades de pointes Active Problems:   Hypertension   High cholesterol   Type II diabetes mellitus   Acute systolic CHF (congestive heart failure)   CAD (coronary artery  disease)   Mobitz type 2 second degree atrioventricular block   Complete heart block   AICD (automatic cardioverter/defibrillator) present  1. Complete heart block with brady dependant torsades Improved s/p upgrade to CRT device Will need wound check in 10 days Follow-up with me in [place] for EP care in 3 months No driving x 6 months (pt aware--> she does not drive)  2. Acute on chronic systolic dysfunction Continue current medicines She wishes to [facility] in [place] Will need to establish with Dr Wyline MoodBranch and follow-up in the next 1-2 weeks Should have bmet and Mg at that time  3. CAD No ischemic symptoms  DC to home with close outpatient follow-up     Hillis RangeJames Tekisha Darcey, MD 11/06/2014 11:01 AM

## 2014-11-06 NOTE — Discharge Summary (Signed)
Physician Discharge Summary       Patient ID: [name] MRN: [id number] DOB/AGE: Jan 04, 1935 80 y.o.  Admit date: 10/30/2014 Discharge date: 11/06/2014 Primary Cardiologist:Dr. Branch/ new EP:  Dr. Johney Frame  Discharge Diagnoses:  Principal Problem:   Torsades de pointes with 3 shocks, another pace terminated Active Problems:   Acute systolic CHF (congestive heart failure)   Mobitz type 2 second degree atrioventricular block   Cardiomyopathy, ischemic EF 25-30%   S/P ICD (internal cardiac defibrillator) procedure, with upgrade to ICD/CRT-D  Medtronic   Hypertension   High cholesterol   Type II diabetes mellitus   CAD (coronary artery disease)   Complete heart block   AICD (automatic cardioverter/defibrillator) present   Weakness   Discharged Condition: good  Procedures: 11/04/14 upgrade of ICD to ICD/CRT-D medtronic device by Dr. Graciela Husbands.  Hospital Course: 80 y.o. female with ischemic cardiomyopathy, s/p CABGx3 and PCI, last seen at [facility] in 2013 at which time she had PCI to her native RCA and an EF of 25-30% on LV gram. She had ICD placed in [place], [place]. Medtronic EveraXT device. She is followed by Dr. Leonie Green [facility]) in [place]  She presented 10/30/14 to [facility] with worsening shortness of breath, and was observed in the ED to have sustained VT that appeared to be treated by her ICD. This happened two times per the ED physician. She was placed on amiodarone gtt and her potassium was supplemented from 2.8, and was transferred to [facility]. Admitted to ICU.  She was diuresed on the 24th for flash pulmonary edema and acute respiratory failure requiring BiPAP.    ICD interrogated and she has had 9 VF events, 1 pace-terminated episode and 3 shock terminated episodes. 6 aborted charges. Looks like pause-dependent VT, most of which is polymorphic. There appears to be underlying Mobitz type 2 second degree AV block.  Discussed need for dual chamber upgrade with higher rate pacing. Dr.  Graciela Husbands was consulted for EP.  Over next 2 days HR was low - Dr. Graciela Husbands discussed upgrade of device with pt and family.      Hx of Echo with EF 25-30%.  Negative MI.  Pt underwent upgrade of ICD toMedtronic Viva Quad CRT-D device.   Rt atrial lead inserted.  Pt did well.    Pt unable to be discharged on the 29th due to weakness and fatigue.  Cardiac rehab and PT consulted.  Was weak with walk.  PT recommended HH PT and RN to assist with her weakness and multiple medical problems with this new change in her condition.  Also rolling walker and shower stool.  On the 30th improved ambulation with walker-at home she uses a cane.   She has been seen by Dr. Johney Frame today and found stable for discharge.  She has wound check in 10 days then 3 month follow up in [place] with Dr. Johney Frame.  No driving for 6 months though she does not drive.  She will establish general cardiology care in [place] with Dr. Wyline Mood in 1-2 weeks.  Will need BMET and MG+ level on that visit.  D/c weight 175 (pk wt was 181)  I&O for stay -3424.  Consults: cardiology  Significant Diagnostic Studies:  BMET    Component Value Date/Time   NA 137 11/02/2014 0245   K 4.3 11/02/2014 0245   CL 101 11/02/2014 0245   CO2 30 11/02/2014 0245   GLUCOSE 139* 11/02/2014 0245   BUN 12 11/02/2014 0245   CREATININE 1.04 11/02/2014 0245  CALCIUM 7.8* 11/02/2014 0245   GFRNONAA 50* 11/02/2014 0245   GFRAA 59* 11/02/2014 0245     CBC    Component Value Date/Time   WBC 4.8 10/31/2014 0350   RBC 3.98 10/31/2014 0350   HGB 11.5* 10/31/2014 0350   HCT 35.3* 10/31/2014 0350   PLT 188 10/31/2014 0350   MCV 88.7 10/31/2014 0350   MCH 28.9 10/31/2014 0350   MCHC 32.6 10/31/2014 0350   RDW 13.7 10/31/2014 0350   Pk troponin 0.19 with shocks. MG prior to discharge 2.3     CHEST 2 VIEW COMPARISON: October 31, 2014 FINDINGS: Pacemaker present on the left with lead tips attached to the right atrium, right ventricle, and left ventricle. No  pneumothorax. There is mild interstitial edema with cardiomegaly and mild pulmonary venous hypertension. There is atelectatic change in the left base. There is no airspace consolidation. No adenopathy. Patient is status post coronary artery bypass grafting. IMPRESSION: Pacemaker as described without pneumothorax. Mild interstitial edema. Suspect a degree of congestive heart failure. Mild atelectasis left base. No appreciable airspace consolidation.    Discharge Exam: Blood pressure 122/61, pulse 70, temperature 98.2 F (36.8 C), temperature source Oral, resp. rate 18, height  (1.676 m), weight 175 lb 11.3 oz (79.7 kg), SpO2 98 %.  Disposition: 01-Home or Self Care      Discharge Instructions    Face-to-face encounter (required for Medicare/Medicaid patients)    Complete by:  As directed   I [facility] R certify that this patient is under my care and that I, or a nurse practitioner or physician's assistant working with me, had a face-to-face encounter that meets the physician face-to-face encounter requirements with this patient on 11/06/2014. The encounter with the patient was in whole, or in part for the following medical condition(s) which is the primary reason for home health care (List medical condition): torsades de Pointes with 3 ICD shocks, CHB, Mobitz 2 heart block, acute respiratory failure due to acute diastolic HF, and ischemic cardiomyopathy.  The encounter with the patient was in whole, or in part, for the following medical condition, which is the primary reason for home health care:  Torsades de pointes, CHF, debillitation  I certify that, based on my findings, the following services are medically necessary home health services:   Nursing Physical therapy    Reason for Medically Necessary Home Health Services:   Skilled Nursing- Teaching of Disease Process/Symptom Management Skilled Nursing- Changes in Medication/Medication Management Skilled Nursing-  Change/Decline in Patient Status Skilled Nursing- Skilled Assessment/Observation Therapy- Therapeutic Exercises to Increase Strength and Endurance    My clinical findings support the need for the above services:   Unsafe ambulation due to balance issues Unable to leave home safely without assistance and/or assistive device    Further, I certify that my clinical findings support that this patient is homebound due to:   Ambulates short distances less than 300 feet Unable to leave home safely without assistance       For home use only DME Shower stool    Complete by:  As directed      Home Health    Complete by:  As directed   To provide the following care/treatments:   PT RN       Walker rolling    Complete by:  As directed             Medication List    STOP taking these medications        ferrous gluconate  324 MG tablet  Commonly known as:  FERGON     hydrochlorothiazide 25 MG tablet  Commonly known as:  HYDRODIURIL     memantine 10 MG tablet  Commonly known as:  NAMENDA      TAKE these medications        acetaminophen 325 MG tablet  Commonly known as:  TYLENOL  Take 1-2 tablets (325-650 mg total) by mouth every 4 (four) hours as needed for mild pain.     aspirin EC 81 MG tablet  Take 81 mg by mouth daily.     atorvastatin 40 MG tablet  Commonly known as:  LIPITOR  Take 1 tablet (40 mg total) by mouth daily at 6 PM.     carvedilol 12.5 MG tablet  Commonly known as:  COREG  Take 1 tablet (12.5 mg total) by mouth 2 (two) times daily with a meal.     clopidogrel 75 MG tablet  Commonly known as:  PLAVIX  Take 1 tablet (75 mg total) by mouth daily.     ferrous sulfate 325 (65 FE) MG tablet  Take 1 tablet (325 mg total) by mouth daily.     furosemide 40 MG tablet  Commonly known as:  LASIX  Take 1 tablet (40 mg total) by mouth daily.     glyBURIDE 5 MG tablet  Commonly known as:  DIABETA  Take 5 mg by mouth daily with breakfast.     hydrALAZINE 50 MG  tablet  Commonly known as:  APRESOLINE  Take 1 tablet (50 mg total) by mouth every 8 (eight) hours.     isosorbide mononitrate 30 MG 24 hr tablet  Commonly known as:  IMDUR  Take 1 tablet (30 mg total) by mouth daily.     levothyroxine 25 MCG tablet  Commonly known as:  SYNTHROID, LEVOTHROID  Take 1 tablet (25 mcg total) by mouth daily before breakfast.     lisinopril 20 MG tablet  Commonly known as:  PRINIVIL,ZESTRIL  Take 1 tablet (20 mg total) by mouth daily.     nitroGLYCERIN 0.4 MG SL tablet  Commonly known as:  NITROSTAT  Place 1 tablet (0.4 mg total) under the tongue every 5 (five) minutes x 3 doses as needed for chest pain.     spironolactone 25 MG tablet  Commonly known as:  ALDACTONE  Take 1 tablet (25 mg total) by mouth daily.     VENTOLIN HFA 108 (90 BASE) MCG/ACT inhaler  Generic drug:  albuterol  Inhale into the lungs 4 (four) times daily.       Follow-up Information    Follow up with [facility] On 11/18/2014.   Specialty:  Cardiology   Why:  at 2:30 pm to check your incision site.   Contact information:   [address] [place] [address] [phone]      Follow up with Dina Rich, Carmon Ginsberg, MD On 11/21/2014.   Specialty:  Cardiology   Why:  at 1140   Contact information:   [address] [place] [address] [phone]       Follow up with Hillis Range, MD On 03/14/2015.   Specialty:  Cardiology   Why:  at 8:00 am for pacer check.   Contact information:   [address] [place] [address] [phone]        Discharge Instructions: Heart healthy diabetic diet  Weigh daily--today at discharge your weight is 175  pounds Call [phone] if weight climbs more than 3 pounds in a day or 5 pounds in a week. No salt to very little salt in your diet.  No more than 2000 mg in a day. Call if increased shortness of breath or increased swelling.  Your first visit is on [address] in [place] to check pacer site.  Pacer instructions/ICD   Signed: Leone BrandINGOLD,LAURA R Nurse Practitioner-Certified [facility]: [facility] 11/06/2014, 1:56 PM  Time spent on discharge : >30 minutes.     Hillis RangeJames Aaryanna Hyden MD

## 2014-11-06 NOTE — Evaluation (Signed)
Physical Therapy Evaluation Patient Details Name: [name] MRN: [id number] DOB: September 09, 1934 Today's Date: 11/06/2014   History of Present Illness  Admitted with cardiac arrhythmia, torsades des pointes  Past Medical History  Diagnosis Date  . Coronary artery disease     a. 2009 CABG x 3(LIMA->LAD, VG->RAMUS, VG->OM;  b. 04/2012 Cath/PCI: LM nl, LAD 50-70p,2810m, RI 13789m, LCX occluded OM's, RCA 7462m, 30-40d, PDA 70p, VG->RI nl, VG->OM nl, LIMA->LAD nl, EF 25-30%. RCA stented w/ 2.25x28 & 2.5x228mm Xpedition DES'.   . Hypertension   . Hyperlipidemia   . Type II diabetes mellitus   . Alzheimer's dementia   . Chronic systolic CHF (congestive heart failure)     a. 04/2012 EF 25-30%, viable myocardium by rest thallium redistribution   . S/P ICD (internal cardiac defibrillator) procedure, with upgrade to ICD/CRT-D  Medtronic 11/06/2014   Past Surgical History  Procedure Laterality Date  . Coronary artery bypass graft  ~ 2009    CABG X1  . Goiter removed    . Cardiac catheterization  04/07/2012  . Left and right heart catheterization with coronary/graft angiogram N/A 04/07/2012    Procedure: LEFT AND RIGHT HEART CATHETERIZATION WITH Isabel CapriceORONARY/GRAFT ANGIOGRAM;  Surgeon: Vesta MixerPhilip J Nahser, MD;  [facility];  Service: Cardiovascular;  Laterality: N/A;  . Percutaneous coronary stent intervention (pci-s) N/A 04/11/2012    Procedure: PERCUTANEOUS CORONARY STENT INTERVENTION (PCI-S);  Surgeon: Herby Abrahamhomas D Stuckey, MD;  [facility];  Service: Cardiovascular;  Laterality: N/A;  . Bi-ventricular implantable cardioverter defibrillator upgrade N/A 11/04/2014    Procedure: BI-VENTRICULAR IMPLANTABLE CARDIOVERTER DEFIBRILLATOR UPGRADE;  Surgeon: Duke SalviaSteven C Klein, MD;  [facility];  Service: Cardiovascular;  Laterality: N/A;     Clinical Impression  Pt admitted with above diagnosis. Pt currently with functional limitations due to the deficits listed below (see PT Problem List).  Pt will  benefit from skilled PT to increase their independence and safety with mobility to allow discharge to the venue listed below.       Follow Up Recommendations Home health PT;Supervision/Assistance - 24 hour [facility](HHRN for chronic disease management)    Equipment Recommendations  Rolling walker with 5" wheels (I believe pt already has RW; Shower seat)    Recommendations for Other Services       Precautions / Restrictions Precautions Precautions: Fall Precaution Comments: Fall risk greatly reduced with use of RW for bil UE support Restrictions Other Position/Activity Restrictions: minimize use of LUE due to pacemaker      Mobility  Bed Mobility                  Transfers Overall transfer level: Needs assistance Equipment used: Rolling walker (2 wheeled) Transfers: Sit to/from Stand Sit to Stand: Min assist         General transfer comment: Cues to minimize use of LUE; min assist for rise  Ambulation/Gait Ambulation/Gait assistance: Min guard Ambulation Distance (Feet): 300 Feet Assistive device: Rolling walker (2 wheeled) Gait Pattern/deviations: Step-through pattern;Decreased stride length Gait velocity: slowed   General Gait Details: Cues for RW proximity and to keep rW close  Stairs            Wheelchair Mobility    Modified Rankin (Stroke Patients Only)       Balance Overall balance assessment: Needs assistance           Standing balance-Leahy Scale: Fair  Pertinent Vitals/Pain Pain Assessment: 0-10 Pain Score: 6  Pain Location: L shoulder Pain Descriptors / Indicators: Aching;Guarding Pain Intervention(s): Monitored during session    Home Living Family/patient expects to be discharged to:: Private residence Living Arrangements: Spouse/significant other Available Help at Discharge: Family;Available 24 hours/day Type of Home: House Home Access: Stairs to enter Entrance Stairs-Rails:  None Entrance Stairs-Number of Steps: 2 Home Layout: One level Home Equipment: Walker - 2 wheels      Prior Function Level of Independence: Independent         Comments: Noted history of dementia; Husband helps prn     Hand Dominance        Extremity/Trunk Assessment   Upper Extremity Assessment:  (Minimized use of LUE due to recent pacemaker placement)           Lower Extremity Assessment: Generalized weakness         Communication   Communication: No difficulties  Cognition Arousal/Alertness: Awake/alert Behavior During Therapy: WFL for tasks assessed/performed Overall Cognitive Status: History of cognitive impairments - at baseline                      General Comments      Exercises        Assessment/Plan    PT Assessment Patient needs continued PT services  PT Diagnosis Difficulty walking;Acute pain   PT Problem List Decreased strength;Decreased range of motion;Decreased activity tolerance;Decreased balance;Decreased mobility;Decreased cognition;Decreased knowledge of use of DME;Decreased knowledge of precautions;Pain  PT Treatment Interventions DME instruction;Gait training;Stair training;Functional mobility training;Therapeutic activities;Therapeutic exercise;Patient/family education   PT Goals (Current goals can be found in the Care Plan section) Acute Rehab PT Goals Patient Stated Goal: home PT Goal Formulation: With family Time For Goal Achievement: 11/06/14 Potential to Achieve Goals: Good    Frequency Min 3X/week   Barriers to discharge        Co-evaluation               End of Session   Activity Tolerance: Patient tolerated treatment well Patient left: in chair;with call bell/phone within reach;with family/visitor present Nurse Communication: Mobility status         Time: 1200-1221 PT Time Calculation (min) (ACUTE ONLY): 21 min   Charges:   PT Evaluation $Initial PT Evaluation Tier I: 1 Procedure     PT  G CodesVan [name] [name] 11/06/2014, 1:23 PM  [name], [place]  Acute Rehabilitation Services Pager [phone] Office [phone]

## 2014-11-06 NOTE — Progress Notes (Signed)
CARDIAC REHAB PHASE I   PRE:  Rate/Rhythm: 81 Pacing  BP:  Supine: 114/51  Sitting:   Standing:    SaO2:   MODE:  Ambulation: 310 ft   POST:  Rate/Rhythm: 91 Pacing  BP:  Supine:   Sitting: 113./49  Standing:    SaO2:  0825-0900 On arrival pt in bed without c/o. Assisted X 1 and used walker to ambulate. She states that she uses a cane at home. Pt's husband present today. Pt was able to walk 310 feet, c/o of being tired by end of walk. Pt recliner after walk with call light in reach and husband present. Pt did have some difficultly steering walker. She starts sitting in chair before she has her body lined up with chair. I cautioned pt that this was unsafe and could lead to a fall. Pt is not totally engaged in conversation at all time and at times you have to ask questions twice to get her to answer.  Melina CopaLisa Ferris Tally RN 11/06/2014 8:58 AM

## 2014-11-18 ENCOUNTER — Ambulatory Visit: Payer: Medicare FFS

## 2014-11-19 ENCOUNTER — Ambulatory Visit (INDEPENDENT_AMBULATORY_CARE_PROVIDER_SITE_OTHER): Payer: Medicare FFS | Admitting: *Deleted

## 2014-11-19 ENCOUNTER — Encounter: Payer: Self-pay | Admitting: Internal Medicine

## 2014-11-19 DIAGNOSIS — I255 Ischemic cardiomyopathy: Secondary | ICD-10-CM

## 2014-11-19 DIAGNOSIS — I442 Atrioventricular block, complete: Secondary | ICD-10-CM

## 2014-11-19 DIAGNOSIS — I5021 Acute systolic (congestive) heart failure: Secondary | ICD-10-CM

## 2014-11-19 LAB — MDC_IDC_ENUM_SESS_TYPE_INCLINIC
Battery Remaining Longevity: 96 mo
Brady Statistic AP VP Percent: 0.18 %
Brady Statistic AP VS Percent: 0.01 %
Brady Statistic AS VS Percent: 1.54 %
Brady Statistic RA Percent Paced: 0.2 %
Brady Statistic RV Percent Paced: 98.11 %
HIGH POWER IMPEDANCE MEASURED VALUE: 38 Ohm
HighPow Impedance: 171 Ohm
Lead Channel Impedance Value: 399 Ohm
Lead Channel Impedance Value: 399 Ohm
Lead Channel Pacing Threshold Amplitude: 0.5 V
Lead Channel Pacing Threshold Amplitude: 0.875 V
Lead Channel Pacing Threshold Pulse Width: 0.4 ms
Lead Channel Pacing Threshold Pulse Width: 0.4 ms
Lead Channel Pacing Threshold Pulse Width: 0.4 ms
Lead Channel Sensing Intrinsic Amplitude: 1.25 mV
Lead Channel Sensing Intrinsic Amplitude: 3.125 mV
Lead Channel Sensing Intrinsic Amplitude: 5.5 mV
Lead Channel Setting Pacing Amplitude: 2.25 V
Lead Channel Setting Pacing Amplitude: 3.5 V
Lead Channel Setting Pacing Pulse Width: 0.4 ms
Lead Channel Setting Sensing Sensitivity: 0.3 mV
MDC IDC MSMT BATTERY VOLTAGE: 3.1 V
MDC IDC MSMT LEADCHNL RA PACING THRESHOLD AMPLITUDE: 1 V
MDC IDC MSMT LEADCHNL RA SENSING INTR AMPL: 1.75 mV
MDC IDC SESS DTM: 20160412160929
MDC IDC SET LEADCHNL RV PACING AMPLITUDE: 2.5 V
MDC IDC SET LEADCHNL RV PACING PULSEWIDTH: 0.4 ms
MDC IDC SET ZONE DETECTION INTERVAL: 300 ms
MDC IDC STAT BRADY AS VP PERCENT: 98.26 %
Zone Setting Detection Interval: 350 ms
Zone Setting Detection Interval: 360 ms
Zone Setting Detection Interval: 400 ms

## 2014-11-19 NOTE — Progress Notes (Signed)
Wound check appointment. Wound without redness or edema. Incision edges approximated, wound well healed. Normal device function. Thresholds, sensing, and impedances consistent with implant measurements. Device programmed at 3.5V for extra safety margin until 3 month visit. Histogram distribution appropriate for patient and level of activity. No mode switches or ventricular arrhythmias noted. Patient educated about wound care, arm mobility, lifting restrictions, shock plan. ROV in 3 months with JA/E.

## 2014-11-20 ENCOUNTER — Encounter: Payer: Self-pay | Admitting: *Deleted

## 2014-11-21 ENCOUNTER — Encounter: Payer: Self-pay | Admitting: Cardiology

## 2014-11-21 ENCOUNTER — Ambulatory Visit (INDEPENDENT_AMBULATORY_CARE_PROVIDER_SITE_OTHER): Payer: Medicare FFS | Admitting: Cardiology

## 2014-11-21 VITALS — BP 101/65 | HR 74 | Ht 65.0 in | Wt 170.0 lb

## 2014-11-21 DIAGNOSIS — I2510 Atherosclerotic heart disease of native coronary artery without angina pectoris: Secondary | ICD-10-CM

## 2014-11-21 DIAGNOSIS — I10 Essential (primary) hypertension: Secondary | ICD-10-CM

## 2014-11-21 DIAGNOSIS — E785 Hyperlipidemia, unspecified: Secondary | ICD-10-CM

## 2014-11-21 DIAGNOSIS — I255 Ischemic cardiomyopathy: Secondary | ICD-10-CM

## 2014-11-21 DIAGNOSIS — I472 Ventricular tachycardia, unspecified: Secondary | ICD-10-CM

## 2014-11-21 NOTE — Progress Notes (Signed)
Clinical Summary [name] is a 80 y.o.female seen for hospital follow up appointment, this is our first visit together. She has been followed by Dr Algis Downs cardiology in [place], [place]  1. VT - presented to [facility] 10/2014 with SOB, found to be in VT that was converted twice by her ICD. K at that time was 2.8. She was started on amio gtt and transferred to [facility] [name] - she also developed acute pulmonary edema requiring bipap and diuresis - ICD interrogation showed 9 VF events, 1 pace terminated and 3 shock terminated episodes, 6 aborted charges. 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 EP notes also mention complete heart block with brady dependent torsades.  - she underwent ICD upgrade of single chamber ICD to [facility] CRT-D - denies any palpitations since discharge, she is followed in our device clinic.    2. CAD/ICM/Chronic systolic HF - hx of CABG in 2009 (LIMA-LAD, SVG-ramus, SVG-OM). Hx of RCA stent 04/2012 - she reports a more recent stent placed in [place], we are awaiting records - prior ICD placement in [place], [place].  - during recent admit noted to have some volume overload, diuresed 3 liters, and discharge weight was 175 lbs.   - no SOB or DOE. No LE edema - compliant with meds - weights daily, typically 167-169. Limiting sodium intake.  - remains on plavix, she reports her last stent was in [place] perhaps within the last year  3. HTN - checks at home daily. Typically arounds 140s/60s  4. Hyperlipidemia - compliant with statin.    Past Medical History  Diagnosis Date  . Coronary artery disease     a. 2009 CABG x 3(LIMA->LAD, VG->RAMUS, VG->OM;  b. 04/2012 Cath/PCI: LM nl, LAD 50-70p,20m, RI 152m, LCX occluded OM's, RCA 37m, 30-40d, PDA 70p, VG->RI nl, VG->OM nl, LIMA->LAD nl, EF 25-30%. RCA stented w/ 2.25x28 & 2.5x68mm Xpedition DES'.   . Hypertension   . Hyperlipidemia     . Type II diabetes mellitus   . Alzheimer's dementia   . Chronic systolic CHF (congestive heart failure)     a. 04/2012 EF 25-30%, viable myocardium by rest thallium redistribution   . S/P ICD (internal cardiac defibrillator) procedure, with upgrade to ICD/CRT-D  Medtr[employer] 11/06/2014     No Known Allergies   Current Outpatient Prescriptions  Medication Sig Dispense Refill  . acetaminophen (TYLENOL) 325 MG tablet Take 1-2 tablets (325-650 mg total) by mouth every 4 (four) hours as needed for mild pain.    Marland Kitchen aspirin EC 81 MG tablet Take 81 mg by mouth daily.    Marland Kitchen atorvastatin (LIPITOR) 40 MG tablet Take 1 tablet (40 mg total) by mouth daily at 6 PM. 30 tablet 6  . carvedilol (COREG) 12.5 MG tablet Take 1 tablet (12.5 mg total) by mouth 2 (two) times daily with a meal. 60 tablet 6  . clopidogrel (PLAVIX) 75 MG tablet Take 1 tablet (75 mg total) by mouth daily. 30 tablet 6  . ferrous sulfate 325 (65 FE) MG tablet Take 1 tablet (325 mg total) by mouth daily. 30 tablet 3  . furosemide (LASIX) 40 MG tablet Take 1 tablet (40 mg total) by mouth daily. 30 tablet 6  . glyBURIDE (DIABETA) 5 MG tablet Take 5 mg by mouth daily with breakfast.     . hydrALAZINE (APRESOLINE) 50 MG tablet Take 1 tablet (50 mg total) by mouth every  8 (eight) hours. 90 tablet 6  . isosorbide mononitrate (IMDUR) 30 MG 24 hr tablet Take 1 tablet (30 mg total) by mouth daily. 30 tablet 6  . levothyroxine (SYNTHROID, LEVOTHROID) 25 MCG tablet Take 1 tablet (25 mcg total) by mouth daily before breakfast. 30 tablet 6  . lisinopril (PRINIVIL,ZESTRIL) 20 MG tablet Take 1 tablet (20 mg total) by mouth daily. 30 tablet 6  . nitroGLYCERIN (NITROSTAT) 0.4 MG SL tablet Place 1 tablet (0.4 mg total) under the tongue every 5 (five) minutes x 3 doses as needed for chest pain. 25 tablet 3  . spironolactone (ALDACTONE) 25 MG tablet Take 1 tablet (25 mg total) by mouth daily. 30 tablet 6  . VENTOLIN HFA 108 (90 BASE) MCG/ACT inhaler Inhale  into the lungs 4 (four) times daily.  1   No current facility-administered medications for this visit.     Past Surgical History  Procedure Laterality Date  . Coronary artery bypass graft  ~ 2009    CABG X1  . Goiter removed    . Cardiac catheterization  04/07/2012  . Left and right heart catheterization with coronary/graft angiogram N/A 04/07/2012    Procedure: LEFT AND RIGHT HEART CATHETERIZATION WITH Isabel CapriceORONARY/GRAFT ANGIOGRAM;  Surgeon: Vesta MixerPhilip J Nahser, MD;  [facility];  Service: Cardiovascular;  Laterality: N/A;  . Percutaneous coronary stent intervention (pci-s) N/A 04/11/2012    Procedure: PERCUTANEOUS CORONARY STENT INTERVENTION (PCI-S);  Surgeon: Herby Abrahamhomas D Stuckey, MD;  [facility];  Service: Cardiovascular;  Laterality: N/A;  . Bi-ventricular implantable cardioverter defibrillator upgrade N/A 11/04/2014    Procedure: BI-VENTRICULAR IMPLANTABLE CARDIOVERTER DEFIBRILLATOR UPGRADE;  Surgeon: Duke SalviaSteven C Klein, MD;  [facility];  Service: Cardiovascular;  Laterality: N/A;     No Known Allergies    No family history on file.   Social History [name]. [name] reports that she has never smoked. She has never used smokeless tobacco. [name]. [name] reports that she does not drink alcohol.   Review of Systems CONSTITUTIONAL: No weight loss, fever, chills, weakness or fatigue.  HEENT: Eyes: No visual loss, blurred vision, double vision or yellow sclerae.No hearing loss, sneezing, congestion, runny nose or sore throat.  SKIN: No rash or itching.  CARDIOVASCULAR: per HPI RESPIRATORY: No shortness of breath, cough or sputum.  GASTROINTESTINAL: No anorexia, nausea, vomiting or diarrhea. No abdominal pain or blood.  GENITOURINARY: No burning on urination, no polyuria NEUROLOGICAL: No headache, dizziness, syncope, paralysis, ataxia, numbness or tingling in the extremities. No change in bowel or bladder control.  MUSCULOSKELETAL: No muscle, back pain, joint pain or  stiffness.  LYMPHATICS: No enlarged nodes. No history of splenectomy.  PSYCHIATRIC: No history of depression or anxiety.  ENDOCRINOLOGIC: No reports of sweating, cold or heat intolerance. No polyuria or polydipsia.  Marland Kitchen.   Physical Examination p 74 bp 101/65 Wt 170 lbs BMI 28 Gen: resting comfortably, no acute distress HEENT: no scleral icterus, pupils equal round and reactive, no palptable cervical adenopathy,  CV: RRR, no m/r/g, no JVD, no carotid bruits, no JVD Resp: Clear to auscultation bilaterally GI: abdomen is soft, non-tender, non-distended, normal bowel sounds, no hepatosplenomegaly MSK: extremities are warm, no edema.  Skin: warm, no rash Neuro:  no focal deficits Psych: appropriate affect   Diagnostic Studies  04/2012 Cath Hemodynamics:   RA: 4/4 RV: 30/3 PCWP: 9/4 PA: 29/11  Cardiac Output  Thermodilution: 3.39Index of 2.1 Fick : 3.78Index 1.88  Arterial Sat: 94% PA Sat: 60%.  LV pressure: 151/8 Aortic pressure:  152/67  Angiography   Left Main: smooth and normal  Left anterior Descending: Moderate disease in the proximal segment between 50-70%. The LAD gives off a small diagonal and then has a lucent 70% stenosis. Competitive filling can be seen coming from the IMA graft.  Ramus Intermediate : The ramus intermediate is a moderate vessel and is occluded at it's mid point.   Left Circumflex: The LCx is a small - moderate sized vessel . The OM branches are occluded.  Right Coronary Artery: Large and dominant. The entire RCA is moderately diffusely diseased. There is a tortous 40% stenosis in the proximal vessel. The mid RCA has a tight 95% stenosis. There is brisk flow through this stenosis. The distal RCA has a 30-40% stenosis . The PDA has a 70% stenosis in the proximal aspect of the Lorean Ekstrand. The PLSA has no significant disease.  SVG to Ramus : The more superior graft is  assumed to be to the Ramus Feliz Lincoln. Nice graft. No stenosis in the graft or native vessel ( assumed to be Ramus)  SVG to Obtuse Marginal : ( the inferior graft) Normal graft. Normal native vessel.  LIMA to LAD: Normal graft. The anastomosis is normal. There is competitive flow from the native LAD.  LV Gram: done with hand injection of 7 cc contrast. Severe LV dysfunction. EF 25-30%  Complications: No apparent complications Patient did tolerate procedure well.  Contrast used: 92 cc   Conclusions:  1. Severe native CAD . She has patent grafts to the LAD, OM, and assumed ramus Alin Chavira. The native RCA does not have any grafts and has a tight 95% stenosis in the mid segment.   The patient was not consented for PCI prior to arriving in the cath lab and she had already been given valium. We will treat with heparin overnight and plan PCI on Tuesday.   In addition, there was some concern about her renal function and limiting contrast. Staging the PCI will allow her to clear all of the contrast given and minimize risk of contrast induced nephropathy.  2. Severe LV dysfunction: She has an EF of 25-30 %. Her filling pressures are low / normal. I do not think she needs any further diuresis over what she is getting currently.     03/2012 Echo LVEF 20-25%, grade II diastolic dysfunction, mod MR,     Assessment and Plan  1. VT - recent admit with multipel ICD shocks for bradycardic dependent Torsades. Also was significantly hypokalemic at the time - ICD upgraded to CRT-D - no recent palpitations, continue to follow in device clinic - repeat BMET and MG  2. CAD/ICM/Chronic systolic HF - weights are stable at home, appears euvolemic - remains on plavix, she states last stent in [place] but unsure when. Will continue for now, request records - borderline low bp's, will not titrate meds today  3. HTN - at goal, continue current meds  4. Hyperlipidemia - continue statin.  Request most recent pcp labs.     F/u 2 months   Antoine Poche, M.D.

## 2014-11-21 NOTE — Patient Instructions (Signed)
Your physician recommends that you schedule a follow-up appointment in: 2 months with Dr. Wyline MoodBranch  Your physician recommends that you continue on your current medications as directed. Please refer to the Current Medication list given to you today.  Your physician recommends that you return for lab work in: BEFORE YOUR NEXT OFFICE VISIT BMP/MAG  Thank you for choosing [facility]!!

## 2015-01-28 ENCOUNTER — Ambulatory Visit (INDEPENDENT_AMBULATORY_CARE_PROVIDER_SITE_OTHER): Payer: Medicare FFS | Admitting: Cardiology

## 2015-01-28 ENCOUNTER — Encounter: Payer: Self-pay | Admitting: *Deleted

## 2015-01-28 ENCOUNTER — Encounter: Payer: Self-pay | Admitting: Cardiology

## 2015-01-28 VITALS — BP 116/70 | HR 73 | Ht 65.0 in | Wt 179.8 lb

## 2015-01-28 DIAGNOSIS — I2510 Atherosclerotic heart disease of native coronary artery without angina pectoris: Secondary | ICD-10-CM | POA: Diagnosis not present

## 2015-01-28 DIAGNOSIS — E785 Hyperlipidemia, unspecified: Secondary | ICD-10-CM | POA: Diagnosis not present

## 2015-01-28 DIAGNOSIS — I5022 Chronic systolic (congestive) heart failure: Secondary | ICD-10-CM

## 2015-01-28 DIAGNOSIS — R0989 Other specified symptoms and signs involving the circulatory and respiratory systems: Secondary | ICD-10-CM

## 2015-01-28 MED ORDER — CARVEDILOL 12.5 MG PO TABS
18.7500 mg | ORAL_TABLET | Freq: Two times a day (BID) | ORAL | Status: DC
Start: 2015-01-28 — End: 2015-07-07

## 2015-01-28 NOTE — Patient Instructions (Signed)
Your physician recommends that you schedule a follow-up appointment in: 6 WEEKS WITH DR. BRANCH  Your physician has recommended you make the following change in your medication:   INCREASE COREG 18.75 MG TWICE DAILY  Your physician has requested that you have an echocardiogram. Echocardiography is a painless test that uses sound waves to create images of your heart. It provides your doctor with information about the size and shape of your heart and how well your heart's chambers and valves are working. This procedure takes approximately one hour. There are no restrictions for this procedure.  Your physician has requested that you have a carotid duplex. This test is an ultrasound of the carotid arteries in your neck. It looks at blood flow through these arteries that supply the brain with blood. Allow one hour for this exam. There are no restrictions or special instructions.  Your physician recommends that you return for lab work in: BMP/MAG  Thank you for choosing [facility]!!

## 2015-01-28 NOTE — Progress Notes (Signed)
Clinical Summary [name] is a 80 y.o.female seen today for follow up of the following medical problems.  1. VT - presented to [facility] 10/2014 with SOB, found to be in VT that was converted twice by her ICD. K at that time was 2.8. She was started on amio gtt and transferred to [facility] [place] [facility] - she also developed acute pulmonary edema requiring bipap and diuresis - ICD interrogation showed 9 VF events, 1 pace terminated and 3 shock terminated episodes, 6 aborted charges. 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 EP notes also mention complete heart block with brady dependent torsades.  - she underwent ICD upgrade of single chamber ICD to [facility] CRT-D  - denies any palpitations since discharge. She is followed in device clinic.   2. CAD/ICM/Chronic systolic HF - hx of CABG in 2009 (LIMA-LAD, SVG-ramus, SVG-OM). Hx of RCA stent 04/2012 - she reports a more recent stent placed in [place], we requested records last visit but have not received. She remains on plavix  - prior ICD placement in [place], [place].  - during recent admit noted to have some volume overload, diuresed 3 liters, and discharge weight was 175 lbs.   - no SOB or DOE. No LE edema - compliant with meds - weights daily, typically 176-177 lbs. Limiting sodium intake.   3. HTN - checks at home daily. Typically arounds 140s/60s  4. Hyperlipidemia - compliant with statin.    Past Medical History  Diagnosis Date  . Coronary artery disease     a. 2009 CABG x 3(LIMA->LAD, VG->RAMUS, VG->OM;  b. 04/2012 Cath/PCI: LM nl, LAD 50-70p,58m, RI 190m, LCX occluded OM's, RCA 54m, 30-40d, PDA 70p, VG->RI nl, VG->OM nl, LIMA->LAD nl, EF 25-30%. RCA stented w/ 2.25x28 & 2.5x89mm Xpedition DES'.   . Hypertension   . Hyperlipidemia   . Type II diabetes mellitus   . Alzheimer's dementia   . Chronic systolic CHF (congestive heart failure)     a.  04/2012 EF 25-30%, viable myocardium by rest thallium redistribution   . S/P ICD (internal cardiac defibrillator) procedure, with upgrade to ICD/CRT-D  [employer]tr[employer] 11/06/2014     No Known Allergies   Current Outpatient Prescriptions  Medication Sig Dispense Refill  . acetaminophen (TYLENOL) 325 MG tablet Take 1-2 tablets (325-650 mg total) by mouth every 4 (four) hours as needed for mild pain.    Marland Kitchen aspirin EC 81 MG tablet Take 81 mg by mouth daily.    Marland Kitchen atorvastatin (LIPITOR) 40 MG tablet Take 1 tablet (40 mg total) by mouth daily at 6 PM. 30 tablet 6  . carvedilol (COREG) 12.5 MG tablet Take 1 tablet (12.5 mg total) by mouth 2 (two) times daily with a meal. 60 tablet 6  . clopidogrel (PLAVIX) 75 MG tablet Take 1 tablet (75 mg total) by mouth daily. 30 tablet 6  . ferrous sulfate 325 (65 FE) MG tablet Take 1 tablet (325 mg total) by mouth daily. 30 tablet 3  . furosemide (LASIX) 40 MG tablet Take 1 tablet (40 mg total) by mouth daily. 30 tablet 6  . glyBURIDE (DIABETA) 5 MG tablet Take 5 mg by mouth daily with breakfast.     . hydrALAZINE (APRESOLINE) 50 MG tablet Take 1 tablet (50 mg total) by mouth every 8 (eight) hours. 90 tablet 6  . isosorbide mononitrate (IMDUR) 30 MG 24 hr tablet Take 1 tablet (30 mg total)  by mouth daily. 30 tablet 6  . levothyroxine (SYNTHROID, LEVOTHROID) 25 MCG tablet Take 1 tablet (25 mcg total) by mouth daily before breakfast. 30 tablet 6  . lisinopril (PRINIVIL,ZESTRIL) 20 MG tablet Take 1 tablet (20 mg total) by mouth daily. 30 tablet 6  . meclizine (ANTIVERT) 25 MG tablet Take 25 mg by mouth 3 (three) times daily as needed for dizziness.    . memantine (NAMENDA) 10 MG tablet Take 10 mg by mouth 2 (two) times daily.  5  . nitroGLYCERIN (NITROSTAT) 0.4 MG SL tablet Place 1 tablet (0.4 mg total) under the tongue every 5 (five) minutes x 3 doses as needed for chest pain. 25 tablet 3  . spironolactone (ALDACTONE) 25 MG tablet Take 1 tablet (25 mg total) by mouth  daily. 30 tablet 6  . VENTOLIN HFA 108 (90 BASE) MCG/ACT inhaler Inhale into the lungs 4 (four) times daily.  1   No current facility-administered medications for this visit.     Past Surgical History  Procedure Laterality Date  . Coronary artery bypass graft  ~ 2009    CABG X1  . Goiter removed    . Cardiac catheterization  04/07/2012  . Left and right heart catheterization with coronary/graft angiogram N/A 04/07/2012    Procedure: LEFT AND RIGHT HEART CATHETERIZATION WITH Isabel Caprice;  Surgeon: Vesta Mixer, MD;  Location: [facility] CATH LAB;  Service: Cardiovascular;  Laterality: N/A;  . Percutaneous coronary stent intervention (pci-s) N/A 04/11/2012    Procedure: PERCUTANEOUS CORONARY STENT INTERVENTION (PCI-S);  Surgeon: Herby Abraham, MD;  Location: [facility] CATH LAB;  Service: Cardiovascular;  Laterality: N/A;  . Bi-ventricular implantable cardioverter defibrillator upgrade N/A 11/04/2014    Procedure: BI-VENTRICULAR IMPLANTABLE CARDIOVERTER DEFIBRILLATOR UPGRADE;  Surgeon: Duke Salvia, MD;  Location: [facility] CATH LAB;  Service: Cardiovascular;  Laterality: N/A;     No Known Allergies    No family history on file.   Social History Ms. Neverson reports that she has never smoked. She has never used smokeless tobacco. Ms. Mendes reports that she does not drink alcohol.   Review of Systems CONSTITUTIONAL: No weight loss, fever, chills, weakness or fatigue.  HEENT: Eyes: No visual loss, blurred vision, double vision or yellow sclerae.No hearing loss, sneezing, congestion, runny nose or sore throat.  SKIN: No rash or itching.  CARDIOVASCULAR: per HPI RESPIRATORY: No shortness of breath, cough or sputum.  GASTROINTESTINAL: No anorexia, nausea, vomiting or diarrhea. No abdominal pain or blood.  GENITOURINARY: No burning on urination, no polyuria NEUROLOGICAL: No headache, dizziness, syncope, paralysis, ataxia, numbness or tingling in the extremities. No change in bowel or  bladder control.  MUSCULOSKELETAL: No muscle, back pain, joint pain or stiffness.  LYMPHATICS: No enlarged nodes. No history of splenectomy.  PSYCHIATRIC: No history of depression or anxiety.  ENDOCRINOLOGIC: No reports of sweating, cold or heat intolerance. No polyuria or polydipsia.  Marland Kitchen   Physical Examination Filed Vitals:   01/28/15 1455  BP: 116/70  Pulse: 73   Filed Vitals:   01/28/15 1455  Height: 5\' 5"  (1.651 m)  Weight: 179 lb 12.8 oz (81.557 kg)    Gen: resting comfortably, no acute distress HEENT: no scleral icterus, pupils equal round and reactive, no palptable cervical adenopathy,  CV: RRR, no m/r/g, no JVD. Right carotid bruit Resp: Clear to auscultation bilaterally GI: abdomen is soft, non-tender, non-distended, normal bowel sounds, no hepatosplenomegaly MSK: extremities are warm, no edema.  Skin: warm, no rash Neuro:  no focal deficits Psych: appropriate  affect   Diagnostic Studies 04/2012 Cath Hemodynamics:   RA: 4/4 RV: 30/3 PCWP: 9/4 PA: 29/11  Cardiac Output  Thermodilution: 3.39Index of 2.1 Fick : 3.78Index 1.88  Arterial Sat: 94% PA Sat: 60%.  LV pressure: 151/8 Aortic pressure: 152/67  Angiography   Left Main: smooth and normal  Left anterior Descending: Moderate disease in the proximal segment between 50-70%. The LAD gives off a small diagonal and then has a lucent 70% stenosis. Competitive filling can be seen coming from the IMA graft.  Ramus Intermediate : The ramus intermediate is a moderate vessel and is occluded at it's mid point.   Left Circumflex: The LCx is a small - moderate sized vessel . The OM branches are occluded.  Right Coronary Artery: Large and dominant. The entire RCA is moderately diffusely diseased. There is a tortous 40% stenosis in the proximal vessel. The mid RCA has a tight 95% stenosis. There is brisk flow through this stenosis.  The distal RCA has a 30-40% stenosis . The PDA has a 70% stenosis in the proximal aspect of the branch. The PLSA has no significant disease.  SVG to Ramus : The more superior graft is assumed to be to the Ramus branch. Nice graft. No stenosis in the graft or native vessel ( assumed to be Ramus)  SVG to Obtuse Marginal : ( the inferior graft) Normal graft. Normal native vessel.  LIMA to LAD: Normal graft. The anastomosis is normal. There is competitive flow from the native LAD.  LV Gram: done with hand injection of 7 cc contrast. Severe LV dysfunction. EF 25-30%  Complications: No apparent complications Patient did tolerate procedure well.  Contrast used: 92 cc   Conclusions:  1. Severe native CAD . She has patent grafts to the LAD, OM, and assumed ramus branch. The native RCA does not have any grafts and has a tight 95% stenosis in the mid segment.   The patient was not consented for PCI prior to arriving in the cath lab and she had already been given valium. We will treat with heparin overnight and plan PCI on Tuesday.   In addition, there was some concern about her renal function and limiting contrast. Staging the PCI will allow her to clear all of the contrast given and minimize risk of contrast induced nephropathy.  2. Severe LV dysfunction: She has an EF of 25-30 %. Her filling pressures are low / normal. I do not think she needs any further diuresis over what she is getting currently.     03/2012 Echo LVEF 20-25%, grade II diastolic dysfunction, mod MR,      Assessment and Plan   1. VT - previous admit with multiple ICD shocks for bradycardic dependent Torsades. Also was significantly hypokalemic at the time - ICD upgraded to CRT-D - no recent palpitations, continue to follow in device clinic - she did not go for her repeat BMET since last visit, will reorder.   2. CAD/ICM/Chronic systolic HF - will increase coreg to 18.75 mg bid - repeat echo on  medical therapy and 3 months after CRT  3. HTN - at goal, continue current meds  4. Hyperlipidemia - continue statin., request lipid panel from pcp  5. Carotid bruit - obtain carotid US   F/u 6 weeks  Antoine Poche, M.D

## 2015-02-13 ENCOUNTER — Ambulatory Visit (INDEPENDENT_AMBULATORY_CARE_PROVIDER_SITE_OTHER): Payer: Medicare FFS

## 2015-02-13 ENCOUNTER — Other Ambulatory Visit: Payer: Self-pay

## 2015-02-13 DIAGNOSIS — I5022 Chronic systolic (congestive) heart failure: Secondary | ICD-10-CM

## 2015-02-13 DIAGNOSIS — R0989 Other specified symptoms and signs involving the circulatory and respiratory systems: Secondary | ICD-10-CM | POA: Diagnosis not present

## 2015-02-14 ENCOUNTER — Telehealth: Payer: Self-pay | Admitting: *Deleted

## 2015-02-14 NOTE — Telephone Encounter (Signed)
Notes Recorded by Lesle ChrisAngela G Hill, LPN on 4/0/98117/03/2015 at 6:05 PM Left message to return call.

## 2015-02-14 NOTE — Telephone Encounter (Signed)
-----   Message from Antoine PocheJonathan F Branch, MD sent at 02/14/2015  2:39 PM EDT ----- Echo shows heart function continues to be weak, we will continue working with meds to try to improve  Dominga FerryJ Branch MD

## 2015-02-17 NOTE — Telephone Encounter (Signed)
Patient informed. 

## 2015-02-26 ENCOUNTER — Telehealth: Payer: Self-pay | Admitting: *Deleted

## 2015-02-26 NOTE — Telephone Encounter (Signed)
-----   Message from Antoine PocheJonathan F Branch, MD sent at 02/26/2015  2:50 PM EDT ----- Labs look good  Dominga FerryJ Branch MD

## 2015-02-26 NOTE — Telephone Encounter (Signed)
Pt aware, routed to pcp 

## 2015-03-14 ENCOUNTER — Encounter: Payer: Medicare FFS | Admitting: Internal Medicine

## 2015-03-14 ENCOUNTER — Encounter (INDEPENDENT_AMBULATORY_CARE_PROVIDER_SITE_OTHER): Payer: Medicare FFS | Admitting: Internal Medicine

## 2015-03-14 DIAGNOSIS — I442 Atrioventricular block, complete: Secondary | ICD-10-CM

## 2015-03-14 DIAGNOSIS — I441 Atrioventricular block, second degree: Secondary | ICD-10-CM

## 2015-03-14 DIAGNOSIS — I255 Ischemic cardiomyopathy: Secondary | ICD-10-CM

## 2015-03-19 ENCOUNTER — Encounter: Payer: Self-pay | Admitting: Cardiology

## 2015-03-19 ENCOUNTER — Ambulatory Visit (INDEPENDENT_AMBULATORY_CARE_PROVIDER_SITE_OTHER): Payer: Medicare FFS | Admitting: Cardiology

## 2015-03-19 VITALS — BP 107/64 | HR 69 | Ht 65.0 in | Wt 184.0 lb

## 2015-03-19 DIAGNOSIS — I5022 Chronic systolic (congestive) heart failure: Secondary | ICD-10-CM

## 2015-03-19 MED ORDER — LISINOPRIL 40 MG PO TABS: 40.0000 mg | ORAL_TABLET | Freq: Every day | ORAL | Status: DC

## 2015-03-19 NOTE — Progress Notes (Signed)
Patient ID: [name], female   DOB: [birth year], 80 y.o.   MRN: [id number]     Clinical Summary [name] is a 80 y.o.female seen today for follow up of the following medical problems. This is a focused visit on her history of chronic systolic HF, for more detailed history refer to prior notes.    1. CAD/ICM/Chronic systolic HF - hx of CABG in 2009 (LIMA-LAD, SVG-ramus, SVG-OM). Hx of RCA stent 04/2012 - she reports a more recent stent placed in [place], we requested records last visit but have not received. She remains on plavix  - she has CRT-D device followed by EP  - no SOB or DOE. No LE edema - compliant with meds - weights daily, typically 176-177 lbs. Limiting sodium intake.  - last visit increased coreg, notes some increased fatigue but overall tolerable.     2. OSA screen - + snoring, occasional apneic episodes, daytime fatigue.   Past Medical History  Diagnosis Date  . Coronary artery disease     a. 2009 CABG x 3(LIMA->LAD, VG->RAMUS, VG->OM;  b. 04/2012 Cath/PCI: LM nl, LAD 50-70p,27m, RI 12m, LCX occluded OM's, RCA 69m, 30-40d, PDA 70p, VG->RI nl, VG->OM nl, LIMA->LAD nl, EF 25-30%. RCA stented w/ 2.25x28 & 2.5x1mm Xpedition DES'.   . Hypertension   . Hyperlipidemia   . Type II diabetes mellitus   . Alzheimer's dementia   . Chronic systolic CHF (congestive heart failure)     a. 04/2012 EF 25-30%, viable myocardium by rest thallium redistribution   . S/P ICD (internal cardiac defibrillator) procedure, with upgrade to ICD/CRT-D  Medtronic 11/06/2014     No Known Allergies   Current Outpatient Prescriptions  Medication Sig Dispense Refill  . acetaminophen (TYLENOL) 325 MG tablet Take 1-2 tablets (325-650 mg total) by mouth every 4 (four) hours as needed for mild pain.    Marland Kitchen aspirin EC 81 MG tablet Take 81 mg by mouth daily.    Marland Kitchen atorvastatin (LIPITOR) 40 MG tablet Take 1 tablet (40 mg total) by mouth daily at 6 PM. 30 tablet 6  . carvedilol (COREG) 12.5 MG  tablet Take 1.5 tablets (18.75 mg total) by mouth 2 (two) times daily with a meal. 45 tablet 3  . clopidogrel (PLAVIX) 75 MG tablet Take 1 tablet (75 mg total) by mouth daily. 30 tablet 6  . ferrous sulfate 325 (65 FE) MG tablet Take 1 tablet (325 mg total) by mouth daily. 30 tablet 3  . furosemide (LASIX) 40 MG tablet Take 1 tablet (40 mg total) by mouth daily. 30 tablet 6  . glyBURIDE (DIABETA) 5 MG tablet Take 5 mg by mouth daily with breakfast.     . hydrALAZINE (APRESOLINE) 50 MG tablet Take 1 tablet (50 mg total) by mouth every 8 (eight) hours. 90 tablet 6  . isosorbide mononitrate (IMDUR) 30 MG 24 hr tablet Take 1 tablet (30 mg total) by mouth daily. 30 tablet 6  . levothyroxine (SYNTHROID, LEVOTHROID) 25 MCG tablet Take 1 tablet (25 mcg total) by mouth daily before breakfast. 30 tablet 6  . lisinopril (PRINIVIL,ZESTRIL) 20 MG tablet Take 1 tablet (20 mg total) by mouth daily. 30 tablet 6  . meclizine (ANTIVERT) 25 MG tablet Take 25 mg by mouth 3 (three) times daily as needed for dizziness.    . memantine (NAMENDA) 10 MG tablet Take 10 mg by mouth 2 (two) times daily.  5  . nitroGLYCERIN (NITROSTAT) 0.4 MG SL tablet Place 1 tablet (0.4 mg total) under  the tongue every 5 (five) minutes x 3 doses as needed for chest pain. 25 tablet 3  . spironolactone (ALDACTONE) 25 MG tablet Take 1 tablet (25 mg total) by mouth daily. 30 tablet 6   No current facility-administered medications for this visit.     Past Surgical History  Procedure Laterality Date  . Coronary artery bypass graft  ~ 2009    CABG X1  . Goiter removed    . Cardiac catheterization  04/07/2012  . Left and right heart catheterization with coronary/graft angiogram N/A 04/07/2012    Procedure: LEFT AND RIGHT HEART CATHETERIZATION WITH Isabel Caprice;  Surgeon: Vesta Mixer, MD;  Location: [facility] CATH LAB;  Service: Cardiovascular;  Laterality: N/A;  . Percutaneous coronary stent intervention (pci-s) N/A 04/11/2012     Procedure: PERCUTANEOUS CORONARY STENT INTERVENTION (PCI-S);  Surgeon: Herby Abraham, MD;  Location: [facility] CATH LAB;  Service: Cardiovascular;  Laterality: N/A;  . Bi-ventricular implantable cardioverter defibrillator upgrade N/A 11/04/2014    Procedure: BI-VENTRICULAR IMPLANTABLE CARDIOVERTER DEFIBRILLATOR UPGRADE;  Surgeon: Duke Salvia, MD;  Location: [facility] CATH LAB;  Service: Cardiovascular;  Laterality: N/A;     No Known Allergies    No family history on file.   Social History [name] reports that she has never smoked. She has never used smokeless tobacco. [name] reports that she does not drink alcohol.   Review of Systems CONSTITUTIONAL: somnolence HEENT: Eyes: No visual loss, blurred vision, double vision or yellow sclerae.No hearing loss, sneezing, congestion, runny nose or sore throat.  SKIN: No rash or itching.  CARDIOVASCULAR: per HPI RESPIRATORY: No shortness of breath, cough or sputum.  GASTROINTESTINAL: No anorexia, nausea, vomiting or diarrhea. No abdominal pain or blood.  GENITOURINARY: No burning on urination, no polyuria NEUROLOGICAL: No headache, dizziness, syncope, paralysis, ataxia, numbness or tingling in the extremities. No change in bowel or bladder control.  MUSCULOSKELETAL: No muscle, back pain, joint pain or stiffness.  LYMPHATICS: No enlarged nodes. No history of splenectomy.  PSYCHIATRIC: No history of depression or anxiety.  ENDOCRINOLOGIC: No reports of sweating, cold or heat intolerance. No polyuria or polydipsia.  Marland Kitchen   Physical Examination Filed Vitals:   03/19/15 1357  BP: 107/64  Pulse: 69   Filed Vitals:   03/19/15 1357  Height:  (1.651 m)  Weight: 184 lb (83.462 kg)    Gen: resting comfortably, no acute distress HEENT: no scleral icterus, pupils equal round and reactive, no palptable cervical adenopathy,  CV: RRR, no m/r/g, no JVD Resp: Clear to auscultation bilaterally GI: abdomen is soft, non-tender, non-distended,  normal bowel sounds, no hepatosplenomegaly MSK: extremities are warm, no edema.  Skin: warm, no rash Neuro:  no focal deficits Psych: appropriate affect   Diagnostic Studies  04/2012 Cath Hemodynamics:   RA: 4/4 RV: 30/3 PCWP: 9/4 PA: 29/11  Cardiac Output  Thermodilution: 3.39Index of 2.1 Fick : 3.78Index 1.88  Arterial Sat: 94% PA Sat: 60%.  LV pressure: 151/8 Aortic pressure: 152/67  Angiography   Left Main: smooth and normal  Left anterior Descending: Moderate disease in the proximal segment between 50-70%. The LAD gives off a small diagonal and then has a lucent 70% stenosis. Competitive filling can be seen coming from the IMA graft.  Ramus Intermediate : The ramus intermediate is a moderate vessel and is occluded at it's mid point.   Left Circumflex: The LCx is a small - moderate sized vessel . The OM branches are occluded.  Right Coronary Artery: Large and dominant.  The entire RCA is moderately diffusely diseased. There is a tortous 40% stenosis in the proximal vessel. The mid RCA has a tight 95% stenosis. There is brisk flow through this stenosis. The distal RCA has a 30-40% stenosis . The PDA has a 70% stenosis in the proximal aspect of the Moe Graca. The PLSA has no significant disease.  SVG to Ramus : The more superior graft is assumed to be to the Ramus Sarai January. Nice graft. No stenosis in the graft or native vessel ( assumed to be Ramus)  SVG to Obtuse Marginal : ( the inferior graft) Normal graft. Normal native vessel.  LIMA to LAD: Normal graft. The anastomosis is normal. There is competitive flow from the native LAD.  LV Gram: done with hand injection of 7 cc contrast. Severe LV dysfunction. EF 25-30%  Complications: No apparent complications Patient did tolerate procedure well.  Contrast used: 92 cc   Conclusions:  1. Severe native CAD . She has patent  grafts to the LAD, OM, and assumed ramus Mauro Arps. The native RCA does not have any grafts and has a tight 95% stenosis in the mid segment.   The patient was not consented for PCI prior to arriving in the cath lab and she had already been given valium. We will treat with heparin overnight and plan PCI on Tuesday.   In addition, there was some concern about her renal function and limiting contrast. Staging the PCI will allow her to clear all of the contrast given and minimize risk of contrast induced nephropathy.  2. Severe LV dysfunction: She has an EF of 25-30 %. Her filling pressures are low / normal. I do not think she needs any further diuresis over what she is getting currently.     03/2012 Echo LVEF 20-25%, grade II diastolic dysfunction, mod MR,   02/2015 Echo Study Conclusions  - Left ventricle: The cavity size was normal. Wall thickness was increased in a pattern of moderate LVH. Systolic function was severely reduced. The estimated ejection fraction was in the range of 20% to 25%. Diffuse hypokinesis. Features are consistent with a pseudonormal left ventricular filling pattern, with concomitant abnormal relaxation and increased filling pressure (grade 2 diastolic dysfunction). - Aortic valve: Mildly calcified annulus. Trileaflet; mildly thickened leaflets. Valve area (VTI): 1.73 cm^2. Valve area (Vmax): 1.73 cm^2. Valve area (Vmean): 1.92 cm^2. - Mitral valve: Mildly calcified annulus. Mildly thickened leaflets . There was moderate regurgitation. The MR vena contracta is 0.5 cm. - Left atrium: The atrium was moderately to severely dilated. - Right ventricle: The cavity size was mildly dilated. - Right atrium: The atrium was mildly dilated. - Tricuspid valve: There was mild-moderate regurgitation. - Technically adequate study.  02/2015 Carotid US 1-39% bilateral ICA stenosis. Left subclavian stenosis in setting of LIMA graft.    Assessment and  Plan   1. CAD/ICM/Chronic systolic HF - will increase lisinopril to 40mg  daily - hold on further beta blocker titration at this time due to fatigue   2. OSA screen - will refer to Dr Juanetta Gosling for evaluation.     F/u 1 month   Antoine Poche, M.D.,

## 2015-03-19 NOTE — Patient Instructions (Signed)
Your physician recommends that you schedule a follow-up appointment in: 1 MONTH WITH DR. BRANCH  Your physician has recommended you make the following change in your medication:   INCREASE LISINOPRIL 40 MG DAILY  Thank you for choosing [facility]!!

## 2015-04-11 ENCOUNTER — Encounter: Payer: Medicare FFS | Admitting: Internal Medicine

## 2015-04-17 ENCOUNTER — Encounter: Payer: Medicare FFS | Admitting: Cardiology

## 2015-04-17 NOTE — Progress Notes (Signed)
Patient ID: [name], female   DOB: 01-Jul-1935, 80 y.o.   MRN: [id number]    Encounter opened in error

## 2015-05-15 ENCOUNTER — Other Ambulatory Visit: Payer: Self-pay | Admitting: Cardiology

## 2015-06-20 ENCOUNTER — Encounter: Payer: Self-pay | Admitting: Cardiology

## 2015-06-20 ENCOUNTER — Ambulatory Visit (INDEPENDENT_AMBULATORY_CARE_PROVIDER_SITE_OTHER): Payer: Medicare FFS | Admitting: Cardiology

## 2015-06-20 VITALS — BP 128/70 | HR 80 | Ht 66.0 in | Wt 188.0 lb

## 2015-06-20 DIAGNOSIS — I5022 Chronic systolic (congestive) heart failure: Secondary | ICD-10-CM | POA: Diagnosis not present

## 2015-06-20 DIAGNOSIS — I2510 Atherosclerotic heart disease of native coronary artery without angina pectoris: Secondary | ICD-10-CM | POA: Diagnosis not present

## 2015-06-20 DIAGNOSIS — G4730 Sleep apnea, unspecified: Secondary | ICD-10-CM | POA: Diagnosis not present

## 2015-06-20 MED ORDER — CARVEDILOL 25 MG PO TABS: 25.0000 mg | ORAL_TABLET | Freq: Two times a day (BID) | ORAL | Status: DC

## 2015-06-20 NOTE — Progress Notes (Signed)
Patient ID: [name], female   DOB: November 18, 1934, 80 y.o.   MRN: [id number]     Clinical Summary [name] is a 80 y.o.female seen today for follow up of the following medical problems. This is a focused visit on her history of chronic systolic HF, for more detailed history please refer to prior notes.  1. CAD/ICM/Chronic systolic HF - hx of CABG in 2009 (LIMA-LAD, SVG-ramus, SVG-OM). Hx of RCA stent 04/2012 - she [place].She remains on plavix  02/2015 echo LVEF 20-25%, grade II diastolic dysfunction - she has CRT-D device followed by EP  - last visit increased her lisionpril to 40mg  daily. She denies any new side effects - denies any SOB. Denies any LE edema. No orthopnea.  - compliant with meds. Limiting sodium intake.   2. OSA screen - + snoring, occasional apneic episodes, daytime fatigue. Past Medical History  Diagnosis Date  . Coronary artery disease     a. 2009 CABG x 3(LIMA->LAD, VG->RAMUS, VG->OM;  b. 04/2012 Cath/PCI: LM nl, LAD 50-70p,7477m, RI 11823m, LCX occluded OM's, RCA 1139m, 30-40d, PDA 70p, VG->RI nl, VG->OM nl, LIMA->LAD nl, EF 25-30%. RCA stented w/ 2.25x28 & 2.5x698mm Xpedition DES'.   . Hypertension   . Hyperlipidemia   . Type II diabetes mellitus   . Alzheimer's dementia   . Chronic systolic CHF (congestive heart failure)     a. 04/2012 EF 25-30%, viable myocardium by rest thallium redistribution   . S/P ICD (internal cardiac defibrillator) procedure, with upgrade to ICD/CRT-D  Medtronic 11/06/2014     No Known Allergies   Current Outpatient Prescriptions  Medication Sig Dispense Refill  . acetaminophen (TYLENOL) 325 MG tablet Take 1-2 tablets (325-650 mg total) by mouth every 4 (four) hours as needed for mild pain.    Marland Kitchen. aspirin EC 81 MG tablet Take 81 mg by mouth daily.    Marland Kitchen. atorvastatin (LIPITOR) 40 MG tablet Take 1 tablet (40 mg total) by mouth daily at 6 PM. 30 tablet 6  . carvedilol (COREG) 12.5 MG tablet Take 1.5  tablets (18.75 mg total) by mouth 2 (two) times daily with a meal. 45 tablet 3  . clopidogrel (PLAVIX) 75 MG tablet Take 1 tablet (75 mg total) by mouth daily. 30 tablet 6  . ferrous gluconate (FERGON) 324 MG tablet Take 324 mg by mouth daily.  1  . ferrous sulfate 325 (65 FE) MG tablet TAKE 1 TABLET (325 MG TOTAL) BY MOUTH DAILY. 30 tablet 0  . furosemide (LASIX) 40 MG tablet Take 1 tablet (40 mg total) by mouth daily. 30 tablet 6  . glyBURIDE (DIABETA) 5 MG tablet Take 5 mg by mouth daily with breakfast.     . hydrALAZINE (APRESOLINE) 50 MG tablet Take 1 tablet (50 mg total) by mouth every 8 (eight) hours. 90 tablet 6  . isosorbide mononitrate (IMDUR) 30 MG 24 hr tablet Take 1 tablet (30 mg total) by mouth daily. 30 tablet 6  . levothyroxine (SYNTHROID, LEVOTHROID) 25 MCG tablet Take 1 tablet (25 mcg total) by mouth daily before breakfast. 30 tablet 6  . lisinopril (PRINIVIL,ZESTRIL) 40 MG tablet Take 1 tablet (40 mg total) by mouth daily. 90 tablet 3  . meclizine (ANTIVERT) 25 MG tablet Take 25 mg by mouth 3 (three) times daily as needed for dizziness.    . memantine (NAMENDA) 10 MG tablet Take 10 mg by mouth 2 (two) times daily.  5  . nitroGLYCERIN (NITROSTAT) 0.4 MG SL  tablet Place 1 tablet (0.4 mg total) under the tongue every 5 (five) minutes x 3 doses as needed for chest pain. 25 tablet 3  . spironolactone (ALDACTONE) 25 MG tablet Take 1 tablet (25 mg total) by mouth daily. 30 tablet 6   No current facility-administered medications for this visit.     Past Surgical History  Procedure Laterality Date  . Coronary artery bypass graft  ~ 2009    CABG X1  . Goiter removed    . Cardiac catheterization  04/07/2012  . Left and right heart catheterization with coronary/graft angiogram N/A 04/07/2012    Procedure: LEFT AND RIGHT HEART CATHETERIZATION WITH Isabel Caprice;  Surgeon: Vesta Mixer, MD;  Location: [facility] CATH LAB;  Service: Cardiovascular;  Laterality: N/A;  . Percutaneous  coronary stent intervention (pci-s) N/A 04/11/2012    Procedure: PERCUTANEOUS CORONARY STENT INTERVENTION (PCI-S);  Surgeon: Herby Abraham, MD;  Location: [facility] CATH LAB;  Service: Cardiovascular;  Laterality: N/A;  . Bi-ventricular implantable cardioverter defibrillator upgrade N/A 11/04/2014    Procedure: BI-VENTRICULAR IMPLANTABLE CARDIOVERTER DEFIBRILLATOR UPGRADE;  Surgeon: Duke Salvia, MD;  Location: [facility] CATH LAB;  Service: Cardiovascular;  Laterality: N/A;     No Known Allergies    Denies any family history of heart disease   Social History [name] reports that she has never smoked. She has never used smokeless tobacco. [name] reports that she does not drink alcohol.   Review of Systems CONSTITUTIONAL: No weight loss, fever, chills, weakness or fatigue.  HEENT: Eyes: No visual loss, blurred vision, double vision or yellow sclerae.No hearing loss, sneezing, congestion, runny nose or sore throat.  SKIN: No rash or itching.  CARDIOVASCULAR: per HPI RESPIRATORY: No shortness of breath, cough or sputum.  GASTROINTESTINAL: No anorexia, nausea, vomiting or diarrhea. No abdominal pain or blood.  GENITOURINARY: No burning on urination, no polyuria NEUROLOGICAL: No headache, dizziness, syncope, paralysis, ataxia, numbness or tingling in the extremities. No change in bowel or bladder control.  MUSCULOSKELETAL: No muscle, back pain, joint pain or stiffness.  LYMPHATICS: No enlarged nodes. No history of splenectomy.  PSYCHIATRIC: No history of depression or anxiety.  ENDOCRINOLOGIC: No reports of sweating, cold or heat intolerance. No polyuria or polydipsia.  Marland Kitchen   Physical Examination Filed Vitals:   06/20/15 1502  BP: 128/70  Pulse: 80   Filed Vitals:   06/20/15 1502  Height:  (1.676 m)  Weight: 188 lb (85.276 kg)    Gen: resting comfortably, no acute distress HEENT: no scleral icterus, pupils equal round and reactive, no palptable cervical adenopathy,  CV: RRR,  no m/r/g, no jvd Resp: Clear to auscultation bilaterally GI: abdomen is soft, non-tender, non-distended, normal bowel sounds, no hepatosplenomegaly MSK: extremities are warm, no edema.  Skin: warm, no rash Neuro:  no focal deficits Psych: appropriate affect   Diagnostic Studies 04/2012 Cath Hemodynamics:   RA: 4/4 RV: 30/3 PCWP: 9/4 PA: 29/11  Cardiac Output  Thermodilution: 3.39Index of 2.1 Fick : 3.78Index 1.88  Arterial Sat: 94% PA Sat: 60%.  LV pressure: 151/8 Aortic pressure: 152/67  Angiography   Left Main: smooth and normal  Left anterior Descending: Moderate disease in the proximal segment between 50-70%. The LAD gives off a small diagonal and then has a lucent 70% stenosis. Competitive filling can be seen coming from the IMA graft.  Ramus Intermediate : The ramus intermediate is a moderate vessel and is occluded at it's mid point.   Left Circumflex: The LCx is a small -  moderate sized vessel . The OM branches are occluded.  Right Coronary Artery: Large and dominant. The entire RCA is moderately diffusely diseased. There is a tortous 40% stenosis in the proximal vessel. The mid RCA has a tight 95% stenosis. There is brisk flow through this stenosis. The distal RCA has a 30-40% stenosis . The PDA has a 70% stenosis in the proximal aspect of the Lyndzee Kliebert. The PLSA has no significant disease.  SVG to Ramus : The more superior graft is assumed to be to the Ramus Levern Pitter. Nice graft. No stenosis in the graft or native vessel ( assumed to be Ramus)  SVG to Obtuse Marginal : ( the inferior graft) Normal graft. Normal native vessel.  LIMA to LAD: Normal graft. The anastomosis is normal. There is competitive flow from the native LAD.  LV Gram: done with hand injection of 7 cc contrast. Severe LV dysfunction. EF 25-30%  Complications: No apparent complications Patient did  tolerate procedure well.  Contrast used: 92 cc   Conclusions:  1. Severe native CAD . She has patent grafts to the LAD, OM, and assumed ramus Tamanna Whitson. The native RCA does not have any grafts and has a tight 95% stenosis in the mid segment.   The patient was not consented for PCI prior to arriving in the cath lab and she had already been given valium. We will treat with heparin overnight and plan PCI on Tuesday.   In addition, there was some concern about her renal function and limiting contrast. Staging the PCI will allow her to clear all of the contrast given and minimize risk of contrast induced nephropathy.  2. Severe LV dysfunction: She has an EF of 25-30 %. Her filling pressures are low / normal. I do not think she needs any further diuresis over what she is getting currently.     03/2012 Echo LVEF 20-25%, grade II diastolic dysfunction, mod MR,   02/2015 Echo Study Conclusions  - Left ventricle: The cavity size was normal. Wall thickness was increased in a pattern of moderate LVH. Systolic function was severely reduced. The estimated ejection fraction was in the range of 20% to 25%. Diffuse hypokinesis. Features are consistent with a pseudonormal left ventricular filling pattern, with concomitant abnormal relaxation and increased filling pressure (grade 2 diastolic dysfunction). - Aortic valve: Mildly calcified annulus. Trileaflet; mildly thickened leaflets. Valve area (VTI): 1.73 cm^2. Valve area (Vmax): 1.73 cm^2. Valve area (Vmean): 1.92 cm^2. - Mitral valve: Mildly calcified annulus. Mildly thickened leaflets . There was moderate regurgitation. The MR vena contracta is 0.5 cm. - Left atrium: The atrium was moderately to severely dilated. - Right ventricle: The cavity size was mildly dilated. - Right atrium: The atrium was mildly dilated. - Tricuspid valve: There was mild-moderate regurgitation. - Technically adequate study.  02/2015  Carotid US 1-39% bilateral ICA stenosis. Left subclavian stenosis in setting of LIMA graft.       Assessment and Plan   1. CAD/ICM/Chronic systolic HF - currently euvolemic and asymptomatic - will increase coreg to  bid   2. OSA screen - will refer to Dr Juanetta Gosling for evaluation.    F/u 3 months  Antoine Poche, M.D.

## 2015-06-20 NOTE — Patient Instructions (Addendum)
Your physician recommends that you schedule a follow-up appointment in: 3 MONTHS WITH DR. BRANCH AND DR. ALLRED   Your physician has recommended you make the following change in your medication:   INCREASE COREG 25  MG TWICE DAILY  You have been referred to DR. HAWKINS SLEEP APNEA   Thank you for choosing [facility]!!

## 2015-06-21 ENCOUNTER — Other Ambulatory Visit: Payer: Self-pay | Admitting: Cardiology

## 2015-06-24 ENCOUNTER — Other Ambulatory Visit: Payer: Self-pay | Admitting: Cardiology

## 2015-07-01 ENCOUNTER — Other Ambulatory Visit: Payer: Self-pay | Admitting: *Deleted

## 2015-07-01 DIAGNOSIS — I5021 Acute systolic (congestive) heart failure: Secondary | ICD-10-CM

## 2015-07-01 MED ORDER — ATORVASTATIN CALCIUM 40 MG PO TABS: 40.0000 mg | ORAL_TABLET | Freq: Every day | ORAL | Status: DC

## 2015-07-01 MED ORDER — CLOPIDOGREL BISULFATE 75 MG PO TABS: 75.0000 mg | ORAL_TABLET | Freq: Every day | ORAL | Status: DC

## 2015-07-07 ENCOUNTER — Other Ambulatory Visit: Payer: Self-pay | Admitting: Cardiology

## 2015-07-14 ENCOUNTER — Other Ambulatory Visit: Payer: Self-pay | Admitting: Cardiology

## 2015-07-14 MED ORDER — ISOSORBIDE MONONITRATE ER 30 MG PO TB24: 30.0000 mg | ORAL_TABLET | Freq: Every day | ORAL | Status: DC

## 2015-08-15 ENCOUNTER — Encounter: Payer: Self-pay | Admitting: Internal Medicine

## 2015-08-15 ENCOUNTER — Ambulatory Visit (INDEPENDENT_AMBULATORY_CARE_PROVIDER_SITE_OTHER): Payer: Medicare FFS | Admitting: Internal Medicine

## 2015-08-15 VITALS — BP 128/78 | HR 81 | Ht 65.0 in | Wt 170.0 lb

## 2015-08-15 DIAGNOSIS — I5021 Acute systolic (congestive) heart failure: Secondary | ICD-10-CM

## 2015-08-15 DIAGNOSIS — I442 Atrioventricular block, complete: Secondary | ICD-10-CM

## 2015-08-15 DIAGNOSIS — I4729 Other ventricular tachycardia: Secondary | ICD-10-CM | POA: Insufficient documentation

## 2015-08-15 DIAGNOSIS — I255 Ischemic cardiomyopathy: Secondary | ICD-10-CM | POA: Diagnosis not present

## 2015-08-15 DIAGNOSIS — I472 Ventricular tachycardia: Secondary | ICD-10-CM | POA: Insufficient documentation

## 2015-08-15 DIAGNOSIS — I5022 Chronic systolic (congestive) heart failure: Secondary | ICD-10-CM | POA: Diagnosis not present

## 2015-08-15 LAB — CUP PACEART INCLINIC DEVICE CHECK
Battery Remaining Longevity: 84 mo
Battery Voltage: 3 V
Brady Statistic AP VP Percent: 2.52 %
Brady Statistic AS VS Percent: 0.92 %
Brady Statistic RA Percent Paced: 2.55 %
HighPow Impedance: 42 Ohm
Implantable Lead Implant Date: 20160328
Implantable Lead Implant Date: 20160328
Implantable Lead Location: 753858
Implantable Lead Location: 753859
Implantable Lead Location: 753860
Implantable Lead Model: 4598
Implantable Lead Model: 5076
Lead Channel Impedance Value: 304 Ohm
Lead Channel Impedance Value: 304 Ohm
Lead Channel Impedance Value: 361 Ohm
Lead Channel Impedance Value: 399 Ohm
Lead Channel Impedance Value: 475 Ohm
Lead Channel Impedance Value: 513 Ohm
Lead Channel Impedance Value: 551 Ohm
Lead Channel Impedance Value: 551 Ohm
Lead Channel Pacing Threshold Amplitude: 0.75 V
Lead Channel Pacing Threshold Amplitude: 0.75 V
Lead Channel Pacing Threshold Amplitude: 1 V
Lead Channel Pacing Threshold Pulse Width: 0.4 ms
Lead Channel Pacing Threshold Pulse Width: 0.4 ms
Lead Channel Sensing Intrinsic Amplitude: 1.375 mV
Lead Channel Sensing Intrinsic Amplitude: 4.875 mV
Lead Channel Setting Pacing Amplitude: 1.5 V
Lead Channel Setting Pacing Amplitude: 2.5 V
Lead Channel Setting Pacing Pulse Width: 0.4 ms
Lead Channel Setting Pacing Pulse Width: 0.4 ms
MDC IDC LEAD IMPLANT DT: 20160328
MDC IDC MSMT LEADCHNL LV IMPEDANCE VALUE: 285 Ohm
MDC IDC MSMT LEADCHNL LV IMPEDANCE VALUE: 304 Ohm
MDC IDC MSMT LEADCHNL LV IMPEDANCE VALUE: 361 Ohm
MDC IDC MSMT LEADCHNL LV IMPEDANCE VALUE: 589 Ohm
MDC IDC MSMT LEADCHNL RA IMPEDANCE VALUE: 342 Ohm
MDC IDC MSMT LEADCHNL RA PACING THRESHOLD PULSEWIDTH: 0.4 ms
MDC IDC SESS DTM: 20170106132800
MDC IDC SET LEADCHNL RV PACING AMPLITUDE: 2.5 V
MDC IDC SET LEADCHNL RV SENSING SENSITIVITY: 0.3 mV
MDC IDC STAT BRADY AP VS PERCENT: 0.03 %
MDC IDC STAT BRADY AS VP PERCENT: 96.53 %
MDC IDC STAT BRADY RV PERCENT PACED: 98.37 %

## 2015-08-15 NOTE — Patient Instructions (Signed)
Your physician recommends that you continue on your current medications as directed. Please refer to the Current Medication list given to you today. Device check on 11/17/15. Your physician recommends that you schedule a follow-up appointment in: 1 year with Dr. Johney FrameAllred. You can schedule this appointment today or you can wait for your letter to come in the mail in about 10 months reminding you to call and schedule this appointment. If you do not receive this letter, please contact our office for your appointment. [facility] Randon GoldsmithLaurie Short RN will be contacting you about this.

## 2015-08-15 NOTE — Progress Notes (Signed)
Electrophysiology Office Note   Date:  08/15/2015   ID:  [name], DOB 10/07/1934, MRN [id number]  PCP:  [name], MD  Cardiologist:  Dr Wyline Mood Primary Electrophysiologist: Hillis Range, MD    CC: VT   History of Present Illness: [name] is a 81 y.o. female who presents today for electrophysiology evaluation.   The patient presents to establish in the device clinic.  She underwent initial ICD implant for primary prevention in [place] by Dr Levonne Spiller June 2015.  She developed bradycardia dependant polymorphic VT and was admitted 10/2014 to [facility].  She was seen by Dr Graciela Husbands (his note reviewed today) and underwent upgrade of her device to a BiV ICD.  She has done well since.  She has not followed up with EP since that time.  She denies prior ICD shocks and is otherwise doing well.  Today, she denies symptoms of palpitations, chest pain, shortness of breath, orthopnea, PND, lower extremity edema, claudication, dizziness, presyncope, syncope, bleeding, or neurologic sequela. The patient is tolerating medications without difficulties and is otherwise without complaint today.    Past Medical History  Diagnosis Date  . Coronary artery disease     a. 2009 CABG x 3(LIMA->LAD, VG->RAMUS, VG->OM;  b. 04/2012 Cath/PCI: LM nl, LAD 50-70p,26m, RI 140m, LCX occluded OM's, RCA 83m, 30-40d, PDA 70p, VG->RI nl, VG->OM nl, LIMA->LAD nl, EF 25-30%. RCA stented w/ 2.25x28 & 2.5x73mm Xpedition DES'.   . Hypertension   . Hyperlipidemia   . Type II diabetes mellitus (HCC)   . Alzheimer's dementia   . Chronic systolic CHF (congestive heart failure) (HCC)     a. 04/2012 EF 25-30%, viable myocardium by rest thallium redistribution   . S/P ICD (internal cardiac defibrillator) procedure, with upgrade to ICD/CRT-D  Medtronic 11/06/2014  . Polymorphic ventricular tachycardia (HCC)   . Ischemic cardiomyopathy    Past Surgical History  Procedure Laterality Date  . Coronary artery bypass graft  ~ 2009   CABG X1  . Goiter removed    . Cardiac catheterization  04/07/2012  . Left and right heart catheterization with coronary/graft angiogram N/A 04/07/2012    Procedure: LEFT AND RIGHT HEART CATHETERIZATION WITH Isabel Caprice;  Surgeon: Vesta Mixer, MD;  Location: [facility] CATH LAB;  Service: Cardiovascular;  Laterality: N/A;  . Percutaneous coronary stent intervention (pci-s) N/A 04/11/2012    Procedure: PERCUTANEOUS CORONARY STENT INTERVENTION (PCI-S);  Surgeon: Herby Abraham, MD;  Location: [facility] CATH LAB;  Service: Cardiovascular;  Laterality: N/A;  . Bi-ventricular implantable cardioverter defibrillator upgrade N/A 11/04/2014    Initial ICD implanted in [place] by Dr Levonne Spiller.  Upgraded to MDT BiV ICD by Dr Graciela Husbands for bradycardia dependant PMVT     Current Outpatient Prescriptions  Medication Sig Dispense Refill  . acetaminophen (TYLENOL) 325 MG tablet Take 1-2 tablets (325-650 mg total) by mouth every 4 (four) hours as needed for mild pain.    Marland Kitchen aspirin EC 81 MG tablet Take 81 mg by mouth daily.    Marland Kitchen atorvastatin (LIPITOR) 40 MG tablet Take 1 tablet (40 mg total) by mouth daily at 6 PM. 30 tablet 11  . carvedilol (COREG) 25 MG tablet Take 1 tablet (25 mg total) by mouth 2 (two) times daily. 180 tablet 3  . clopidogrel (PLAVIX) 75 MG tablet Take 1 tablet (75 mg total) by mouth daily. 30 tablet 11  . ferrous gluconate (FERGON) 324 MG tablet Take 324 mg by mouth daily.  1  . ferrous sulfate 325 (65 FE)  MG tablet TAKE 1 TABLET BY MOUTH EVERY DAY (NEEDS APPOINTMENT FOR MORE REFILLS) 30 tablet 6  . furosemide (LASIX) 40 MG tablet Take 1 tablet (40 mg total) by mouth daily. 30 tablet 6  . glyBURIDE (DIABETA) 5 MG tablet Take 5 mg by mouth daily with breakfast.     . hydrALAZINE (APRESOLINE) 50 MG tablet Take 1 tablet (50 mg total) by mouth every 8 (eight) hours. 90 tablet 6  . isosorbide mononitrate (IMDUR) 30 MG 24 hr tablet Take 1 tablet (30 mg total) by mouth daily. 30 tablet 2  .  levothyroxine (SYNTHROID, LEVOTHROID) 25 MCG tablet Take 1 tablet (25 mcg total) by mouth daily before breakfast. 30 tablet 6  . lisinopril (PRINIVIL,ZESTRIL) 40 MG tablet Take 1 tablet (40 mg total) by mouth daily. 90 tablet 3  . meclizine (ANTIVERT) 25 MG tablet Take 25 mg by mouth 3 (three) times daily as needed for dizziness.    . memantine (NAMENDA) 10 MG tablet Take 10 mg by mouth 2 (two) times daily.  5  . nitroGLYCERIN (NITROSTAT) 0.4 MG SL tablet Place 1 tablet (0.4 mg total) under the tongue every 5 (five) minutes x 3 doses as needed for chest pain. 25 tablet 3  . spironolactone (ALDACTONE) 25 MG tablet TAKE 1 TABLET (25 MG TOTAL) BY MOUTH DAILY. 30 tablet 11   No current facility-administered medications for this visit.    Allergies:   Review of patient's allergies indicates no known allergies.   Social History:  The patient  reports that she has never smoked. She has never used smokeless tobacco. She reports that she does not drink alcohol or use illicit drugs.   Family History:   + HTN   ROS:  Please see the history of present illness.   All other systems are reviewed and negative.    PHYSICAL EXAM: VS:  BP 128/78 mmHg  Pulse 81  Ht 5\' 5"  (1.651 m)  Wt 170 lb (77.111 kg)  BMI 28.29 kg/m2  SpO2 98% , BMI Body mass index is 28.29 kg/(m^2). GEN: elderly, in no acute distress HEENT: normal Neck: no JVD, carotid bruits, or masses Cardiac: RRR; no murmurs, rubs, or gallops,no edema  Respiratory:  clear to auscultation bilaterally, normal work of breathing GI: soft, nontender, nondistended, + BS MS: no deformity or atrophy Skin: warm and dry, device pocket is well healed Neuro:  Strength and sensation are intact Psych: euthymic mood, full affect  EKG:  EKG is ordered today. The ekg ordered today shows sinus rhythm BiV pacing, Qtc 487 msec  Device interrogation is reviewed today in detail.  See PaceArt for details.   Recent Labs: 10/31/2014: Hemoglobin 11.5*;  Platelets 188 11/01/2014: B Natriuretic Peptide 583.7* 11/02/2014: ALT 46*; BUN 12; Creatinine, Ser 1.04; Magnesium 2.3; Potassium 4.3; Sodium 137    Lipid Panel  No results found for: CHOL, TRIG, HDL, CHOLHDL, VLDL, LDLCALC, LDLDIRECT   Wt Readings from Last 3 Encounters:  08/15/15 170 lb (77.111 kg)  06/20/15 188 lb (85.276 kg)  03/19/15 184 lb (83.462 kg)      Other studies Reviewed: Additional studies/ records that were reviewed today include: Dr Princess PernaBranchs notes, Dr Koren BoundKleins consult note, ICD upgrade note   ASSESSMENT AND PLAN:  1.  Polymorphic VT Resolved with ICD upgrade Normal BiV ICD function See Pace Art report No changes today  2. Ischemic CM euvolemic today No medicine changes Will enroll in ICM device clinic with Randon GoldsmithLaurie Short (letter given to patient today)  3.  CAD No ischemic symptoms  4. HTN Stable No change required today  Follow-up: carelink Enroll in ICM device clinic Return to see me in 1 year  Current medicines are reviewed at length with the patient today.   The patient does not have concerns regarding her medicines.  The following changes were made today:  none  Labs/ tests ordered today include:  Orders Placed This Encounter  Procedures  . EKG 12-Lead     Signed, Hillis Range, MD  08/15/2015 11:47 AM     [facility] [address] [place] [address] [phone] (office) [phone] (fax)

## 2015-08-27 ENCOUNTER — Telehealth: Payer: Self-pay

## 2015-08-27 NOTE — Telephone Encounter (Signed)
Patient referred to [facility] clinic by Dr Johney Frame at last office visit and enrollment letter given at that time.   Attempted ICM intro call to patient and left message for return call.

## 2015-09-09 ENCOUNTER — Other Ambulatory Visit: Payer: Self-pay | Admitting: *Deleted

## 2015-09-09 MED ORDER — HYDRALAZINE HCL 50 MG PO TABS: 50.0000 mg | ORAL_TABLET | Freq: Three times a day (TID) | ORAL | Status: DC

## 2015-09-16 ENCOUNTER — Ambulatory Visit (INDEPENDENT_AMBULATORY_CARE_PROVIDER_SITE_OTHER): Payer: Medicare FFS | Admitting: Cardiology

## 2015-09-16 ENCOUNTER — Encounter: Payer: Self-pay | Admitting: Cardiology

## 2015-09-16 VITALS — BP 128/68 | HR 77 | Ht 65.0 in | Wt 181.4 lb

## 2015-09-16 DIAGNOSIS — I2510 Atherosclerotic heart disease of native coronary artery without angina pectoris: Secondary | ICD-10-CM | POA: Diagnosis not present

## 2015-09-16 DIAGNOSIS — I472 Ventricular tachycardia: Secondary | ICD-10-CM

## 2015-09-16 DIAGNOSIS — I4729 Other ventricular tachycardia: Secondary | ICD-10-CM

## 2015-09-16 DIAGNOSIS — I5022 Chronic systolic (congestive) heart failure: Secondary | ICD-10-CM | POA: Diagnosis not present

## 2015-09-16 DIAGNOSIS — G4730 Sleep apnea, unspecified: Secondary | ICD-10-CM

## 2015-09-16 NOTE — Patient Instructions (Signed)
Your physician recommends that you schedule a follow-up appointment in: 4 MONTHS WITH DR BRANCH  Your physician recommends that you continue on your current medications as directed. Please refer to the Current Medication list given to you today.  You have been referred to DR HAWKINS   Thank you for choosing [facility]!!     

## 2015-09-16 NOTE — Progress Notes (Signed)
Patient ID: [name], female   DOB: [birth year], 81 y.o.   MRN: [id number]     Clinical Summary [name] is a 81 y.o.female seen today for follow up of the following medical problems.   1. CAD/ICM/Chronic systolic HF - hx of CABG in 2009 (LIMA-LAD, SVG-ramus, SVG-OM). Hx of RCA stent 04/2012 - she reports a more recent stent placed in [place].She remains on plavix  02/2015 echo LVEF 20-25%, grade II diastolic dysfunction - she has CRT-D device followed by EP  - denies any SOB or DOE. Denies any LE edema. Weight down 7 lbs since our last visit.  - compliant with meds, limiting sodium intake.   2. OSA screen - + snoring, occasional apneic episodes, daytime fatigue. - she missed appointment with Dr Juanetta Gosling  3. VT - presented to [facility] 10/2014 with SOB, found to be in VT that was converted twice by her ICD. K at that time was 2.8. She was started on amio gtt and transferred to [facility] - she also developed acute pulmonary edema requiring bipap and diuresis - ICD interrogation showed 9 VF events, 1 pace terminated and 3 shock terminated episodes, 6 aborted charges. 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 EP notes also mention complete heart block with brady dependent torsades.  - she underwent ICD upgrade of single chamber ICD to [facility] CRT-D  - denies any palpitations    4. HTN - checks at home sometimes, typically 120s/60s - compliant with meds  5. Hyperlipidemia - compliant with statin.  Past Medical History  Diagnosis Date  . Coronary artery disease     a. 2009 CABG x 3(LIMA->LAD, VG->RAMUS, VG->OM;  b. 04/2012 Cath/PCI: LM nl, LAD 50-70p,71m, RI 16m, LCX occluded OM's, RCA 46m, 30-40d, PDA 70p, VG->RI nl, VG->OM nl, LIMA->LAD nl, EF 25-30%. RCA stented w/ 2.25x28 & 2.5x102mm Xpedition DES'.   . Hypertension   . Hyperlipidemia   . Type II diabetes mellitus (HCC)   . Alzheimer's  dementia   . Chronic systolic CHF (congestive heart failure) (HCC)     a. 04/2012 EF 25-30%, viable myocardium by rest thallium redistribution   . S/P ICD (internal cardiac defibrillator) procedure, with upgrade to ICD/CRT-D  Medtronic 11/06/2014  . Polymorphic ventricular tachycardia (HCC)   . Ischemic cardiomyopathy      No Known Allergies   Current Outpatient Prescriptions  Medication Sig Dispense Refill  . acetaminophen (TYLENOL) 325 MG tablet Take 1-2 tablets (325-650 mg total) by mouth every 4 (four) hours as needed for mild pain.    Marland Kitchen aspirin EC 81 MG tablet Take 81 mg by mouth daily.    Marland Kitchen atorvastatin (LIPITOR) 40 MG tablet Take 1 tablet (40 mg total) by mouth daily at 6 PM. 30 tablet 11  . carvedilol (COREG) 25 MG tablet Take 1 tablet (25 mg total) by mouth 2 (two) times daily. 180 tablet 3  . clopidogrel (PLAVIX) 75 MG tablet Take 1 tablet (75 mg total) by mouth daily. 30 tablet 11  . ferrous gluconate (FERGON) 324 MG tablet Take 324 mg by mouth daily.  1  . ferrous sulfate 325 (65 FE) MG tablet TAKE 1 TABLET BY MOUTH EVERY DAY (NEEDS APPOINTMENT FOR MORE REFILLS) 30 tablet 6  . furosemide (LASIX) 40 MG tablet Take 1 tablet (40 mg total) by mouth daily. 30 tablet 6  . glyBURIDE (DIABETA) 5 MG tablet Take 5 mg by mouth daily  with breakfast.     . hydrALAZINE (APRESOLINE) 50 MG tablet Take 1 tablet (50 mg total) by mouth every 8 (eight) hours. 90 tablet 11  . isosorbide mononitrate (IMDUR) 30 MG 24 hr tablet Take 1 tablet (30 mg total) by mouth daily. 30 tablet 2  . levothyroxine (SYNTHROID, LEVOTHROID) 25 MCG tablet Take 1 tablet (25 mcg total) by mouth daily before breakfast. 30 tablet 6  . lisinopril (PRINIVIL,ZESTRIL) 40 MG tablet Take 1 tablet (40 mg total) by mouth daily. 90 tablet 3  . meclizine (ANTIVERT) 25 MG tablet Take 25 mg by mouth 3 (three) times daily as needed for dizziness.    . memantine (NAMENDA) 10 MG tablet Take 10 mg by mouth 2 (two) times daily.  5  .  nitroGLYCERIN (NITROSTAT) 0.4 MG SL tablet Place 1 tablet (0.4 mg total) under the tongue every 5 (five) minutes x 3 doses as needed for chest pain. 25 tablet 3  . spironolactone (ALDACTONE) 25 MG tablet TAKE 1 TABLET (25 MG TOTAL) BY MOUTH DAILY. 30 tablet 11   No current facility-administered medications for this visit.     Past Surgical History  Procedure Laterality Date  . Coronary artery bypass graft  ~ 2009    CABG X1  . Goiter removed    . Cardiac catheterization  04/07/2012  . Left and right heart catheterization with coronary/graft angiogram N/A 04/07/2012    Procedure: LEFT AND RIGHT HEART CATHETERIZATION WITH Isabel Caprice;  Surgeon: Vesta Mixer, MD;  Location: [facility] CATH LAB;  Service: Cardiovascular;  Laterality: N/A;  . Percutaneous coronary stent intervention (pci-s) N/A 04/11/2012    Procedure: PERCUTANEOUS CORONARY STENT INTERVENTION (PCI-S);  Surgeon: Herby Abraham, MD;  Location: [facility] CATH LAB;  Service: Cardiovascular;  Laterality: N/A;  . Bi-ventricular implantable cardioverter defibrillator upgrade N/A 11/04/2014    Initial ICD implanted in [place] by Dr Levonne Spiller.  Upgraded to MDT BiV ICD by Dr Graciela Husbands for bradycardia dependant PMVT     No Known Allergies    Family History Denies any family history of heart disease  Social History [name] reports that she has never smoked. She has never used smokeless tobacco. [name] reports that she does not drink alcohol.   Review of Systems CONSTITUTIONAL: No weight loss, fever, chills, weakness or fatigue.  HEENT: Eyes: No visual loss, blurred vision, double vision or yellow sclerae.No hearing loss, sneezing, congestion, runny nose or sore throat.  SKIN: No rash or itching.  CARDIOVASCULAR: RRR, no m/r/g, no jvd RESPIRATORY: No shortness of breath, cough or sputum.  GASTROINTESTINAL: No anorexia, nausea, vomiting or diarrhea. No abdominal pain or blood.  GENITOURINARY: No burning on urination, no  polyuria NEUROLOGICAL: No headache, dizziness, syncope, paralysis, ataxia, numbness or tingling in the extremities. No change in bowel or bladder control.  MUSCULOSKELETAL: No muscle, back pain, joint pain or stiffness.  LYMPHATICS: No enlarged nodes. No history of splenectomy.  PSYCHIATRIC: No history of depression or anxiety.  ENDOCRINOLOGIC: No reports of sweating, cold or heat intolerance. No polyuria or polydipsia.  Marland Kitchen   Physical Examination Filed Vitals:   09/16/15 1409  BP: 128/68  Pulse: 77   Filed Vitals:   09/16/15 1409  Height:  (1.651 m)  Weight: 181 lb 6.4 oz (82.283 kg)    Gen: resting comfortably, no acute distress HEENT: no scleral icterus, pupils equal round and reactive, no palptable cervical adenopathy,  CV: RRR, no m/r/g, no jvd Resp: Clear to auscultation bilaterally GI: abdomen is soft,  non-tender, non-distended, normal bowel sounds, no hepatosplenomegaly MSK: extremities are warm, no edema.  Skin: warm, no rash Neuro:  no focal deficits Psych: appropriate affect   Diagnostic Studies 04/2012 Cath Hemodynamics:   RA: 4/4 RV: 30/3 PCWP: 9/4 PA: 29/11  Cardiac Output  Thermodilution: 3.39Index of 2.1 Fick : 3.78Index 1.88  Arterial Sat: 94% PA Sat: 60%.  LV pressure: 151/8 Aortic pressure: 152/67  Angiography   Left Main: smooth and normal  Left anterior Descending: Moderate disease in the proximal segment between 50-70%. The LAD gives off a small diagonal and then has a lucent 70% stenosis. Competitive filling can be seen coming from the IMA graft.  Ramus Intermediate : The ramus intermediate is a moderate vessel and is occluded at it's mid point.   Left Circumflex: The LCx is a small - moderate sized vessel . The OM branches are occluded.  Right Coronary Artery: Large and dominant. The entire RCA is moderately diffusely diseased. There is a tortous 40%  stenosis in the proximal vessel. The mid RCA has a tight 95% stenosis. There is brisk flow through this stenosis. The distal RCA has a 30-40% stenosis . The PDA has a 70% stenosis in the proximal aspect of the branch. The PLSA has no significant disease.  SVG to Ramus : The more superior graft is assumed to be to the Ramus branch. Nice graft. No stenosis in the graft or native vessel ( assumed to be Ramus)  SVG to Obtuse Marginal : ( the inferior graft) Normal graft. Normal native vessel.  LIMA to LAD: Normal graft. The anastomosis is normal. There is competitive flow from the native LAD.  LV Gram: done with hand injection of 7 cc contrast. Severe LV dysfunction. EF 25-30%  Complications: No apparent complications Patient did tolerate procedure well.  Contrast used: 92 cc   Conclusions:  1. Severe native CAD . She has patent grafts to the LAD, OM, and assumed ramus branch. The native RCA does not have any grafts and has a tight 95% stenosis in the mid segment.   The patient was not consented for PCI prior to arriving in the cath lab and she had already been given valium. We will treat with heparin overnight and plan PCI on Tuesday.   In addition, there was some concern about her renal function and limiting contrast. Staging the PCI will allow her to clear all of the contrast given and minimize risk of contrast induced nephropathy.  2. Severe LV dysfunction: She has an EF of 25-30 %. Her filling pressures are low / normal. I do not think she needs any further diuresis over what she is getting currently.     03/2012 Echo LVEF 20-25%, grade II diastolic dysfunction, mod MR,   02/2015 Echo Study Conclusions  - Left ventricle: The cavity size was normal. Wall thickness was increased in a pattern of moderate LVH. Systolic function was severely reduced. The estimated ejection fraction was in the range of 20% to 25%. Diffuse hypokinesis. Features are  consistent with a pseudonormal left ventricular filling pattern, with concomitant abnormal relaxation and increased filling pressure (grade 2 diastolic dysfunction). - Aortic valve: Mildly calcified annulus. Trileaflet; mildly thickened leaflets. Valve area (VTI): 1.73 cm^2. Valve area (Vmax): 1.73 cm^2. Valve area (Vmean): 1.92 cm^2. - Mitral valve: Mildly calcified annulus. Mildly thickened leaflets . There was moderate regurgitation. The MR vena contracta is 0.5 cm. - Left atrium: The atrium was moderately to severely dilated. - Right ventricle: The cavity size was  mildly dilated. - Right atrium: The atrium was mildly dilated. - Tricuspid valve: There was mild-moderate regurgitation. - Technically adequate study.  02/2015 Carotid US 1-39% bilateral ICA stenosis. Left subclavian stenosis in setting of LIMA graft.        Assessment and Plan  1. CAD/ICM/Chronic systolic HF - no current symptoms, continue current meds   2. OSA screen - will place referral for OSA evaluation by Dr Juanetta Gosling. .    3. VT - followed by EP, no recent symptoms. Continue to follow  3. HTN - at goal, continue current meds  4. Hyperlipidemia - continue statin., request pcp labs     Antoine Poche, M.D.

## 2015-09-30 NOTE — Telephone Encounter (Signed)
Attempted ICM intro call to patient and left message for return call. 

## 2015-10-23 NOTE — Telephone Encounter (Signed)
Attempted ICM call to patient and no answer or answering machine.   Unable to reach for ICM enrollment.

## 2015-11-10 ENCOUNTER — Other Ambulatory Visit: Payer: Self-pay | Admitting: *Deleted

## 2015-11-10 MED ORDER — LEVOTHYROXINE SODIUM 25 MCG PO TABS: 25.0000 ug | ORAL_TABLET | Freq: Every day | ORAL | Status: DC

## 2015-11-17 ENCOUNTER — Telehealth: Payer: Self-pay | Admitting: Cardiology

## 2015-11-17 ENCOUNTER — Encounter: Payer: Medicare FFS | Admitting: *Deleted

## 2015-11-17 NOTE — Telephone Encounter (Signed)
Spoke with pt and reminded pt of remote transmission that is due today. Pt verbalized understanding.   

## 2015-11-24 ENCOUNTER — Encounter: Payer: Self-pay | Admitting: Cardiology

## 2016-01-22 ENCOUNTER — Encounter: Payer: Self-pay | Admitting: Cardiology

## 2016-01-22 ENCOUNTER — Ambulatory Visit: Payer: Medicare FFS | Admitting: Cardiology

## 2016-01-22 NOTE — Progress Notes (Unsigned)
Clinical Summary [name] is a 81 y.o.female seen today for follow up of the following medical problems.   1. CAD/ICM/Chronic systolic HF - hx of Kelly CampanileCABG in 2009 (LIMA-LAD, SVG-ramus, SVG-OM). Hx of RCA stent 04/2012 - she reports a more recent stent placed in [name].She remains on plavix  02/2015 echo LVEF 20-25%, grade II diastolic dysfunction - she has CRT-D device followed by EP  - denies any SOB or DOE. Denies any LE edema. Weight down 7 lbs since our last visit.  - compliant with meds, limiting sodium intake.   2. OSA screen - + snoring, occasional apneic episodes, daytime fatigue. - she missed appointment with [name]  3. VT - [facility] with [facility], found to be in VT that was converted twice by her ICD. K at that time was 2.8. She was started on amio gtt and [facility] - she also developed acute pulmonary edema requiring bipap and diuresis - ICD interrogation showed 9 VF events, 1 pace terminated and 3 shock terminated episodes, 6 aborted charges. 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 EP notes also mention complete heart block with brady dependent torsades.  - she underwent ICD upgrade of single chamber ICD [facility]D  - denies any palpitations   4. HTN - checks at home sometimes, typically 120s/60s - compliant with meds  5. Hyperlipidemia - compliant with statin.  Past Medical History  Diagnosis Date  . Coronary artery disease     a. 2009 CABG x 3(LIMA->LAD, VG->RAMUS, VG->OM;  b. 04/2012 Cath/PCI: LM nl, LAD 50-70p,10846m, [place] 11527m, LCX occluded OM's, RCA 4211m, 30-40d, PDA 70p, VG->RI nl, VG->OM nl, LIMA->LAD nl, EF 25-30%. RCA stented w/ 2.25x28 & 2.5x648mm Xpedition DES'.   . Hypertension   . Hyperlipidemia   . Type II diabetes mellitus (HCC)   . Alzheimer's dementia   . Chronic systolic CHF (congestive heart failure) (HCC)     a.  04/2012 EF 25-30%, viable myocardium by rest thallium redistribution   . S/P ICD (internal cardiac defibrillator) procedure, with upgrade to ICD/CRT-D  Medtronic 11/06/2014  . Polymorphic ventricular tachycardia (HCC)   . Ischemic cardiomyopathy      No Known Allergies   Current Outpatient Prescriptions  Medication Sig Dispense Refill  . acetaminophen (TYLENOL) 325 MG tablet Take 1-2 tablets (325-650 mg total) by mouth every 4 (four) hours as needed for mild pain.    Marland Kitchen. aspirin EC 81 MG tablet Take 81 mg by mouth daily.    Marland Kitchen. atorvastatin (LIPITOR) 40 MG tablet Take 1 tablet (40 mg total) by mouth daily at 6 PM. 30 tablet 11  . carvedilol (COREG) 25 MG tablet Take 1 tablet (25 mg total) by mouth 2 (two) times daily. 180 tablet 3  . clopidogrel (PLAVIX) 75 MG tablet Take 1 tablet (75 mg total) by mouth daily. 30 tablet 11  . ferrous gluconate (FERGON) 324 MG tablet Take 324 mg by mouth daily.  1  . ferrous sulfate 325 (65 FE) MG tablet TAKE 1 TABLET BY MOUTH EVERY DAY (NEEDS APPOINTMENT FOR MORE REFILLS) 30 tablet 6  . furosemide (LASIX) 40 MG tablet Take 1 tablet (40 mg total) by mouth daily. 30 tablet 6  . glyBURIDE (DIABETA) 5 MG tablet Take 5 mg by mouth daily with breakfast.     . hydrALAZINE (APRESOLINE) 50 MG tablet Take 1 tablet (50 mg  total) by mouth every 8 (eight) hours. 90 tablet 11  . isosorbide mononitrate (IMDUR) 30 MG 24 hr tablet Take 1 tablet (30 mg total) by mouth daily. 30 tablet 2  . levothyroxine (SYNTHROID, LEVOTHROID) 25 MCG tablet Take 1 tablet (25 mcg total) by mouth daily before breakfast. 30 tablet 6  . lisinopril (PRINIVIL,ZESTRIL) 40 MG tablet Take 1 tablet (40 mg total) by mouth daily. 90 tablet 3  . meclizine (ANTIVERT) 25 MG tablet Take 25 mg by mouth 3 (three) times daily as needed for dizziness.    . memantine (NAMENDA) 10 MG tablet Take 10 mg by mouth 2 (two) times daily.  5  . nitroGLYCERIN (NITROSTAT) 0.4 MG SL tablet Place 1 tablet (0.4 mg total) under the  tongue every 5 (five) minutes x 3 doses as needed for chest pain. 25 tablet 3  . spironolactone (ALDACTONE) 25 MG tablet TAKE 1 TABLET (25 MG TOTAL) BY MOUTH DAILY. 30 tablet 11   No current facility-administered medications for this visit.     Past Surgical History  Procedure Laterality Date  . Coronary artery bypass graft  ~ 2009    CABG X1  . Goiter removed    . Cardiac catheterization  04/07/2012  . Left and right heart catheterization with coronary/graft angiogram N/A 04/07/2012    Procedure: LEFT AND RIGHT HEART CATHETERIZATION WITH Isabel Caprice;  Surgeon: Vesta Mixer, MD;  Location: [facility] CATH LAB;  Service: Cardiovascular;  Laterality: N/A;  . Percutaneous coronary stent intervention (pci-s) N/A 04/11/2012    Procedure: PERCUTANEOUS CORONARY STENT INTERVENTION (PCI-S);  Surgeon: Herby Abraham, MD;  Location: [facility] CATH LAB;  Service: Cardiovascular;  Laterality: N/A;  . Bi-ventricular implantable cardioverter defibrillator upgrade N/A 11/04/2014    Initial ICD implanted in [place] by Kelly Levonne Spiller.  Upgraded to MDT BiV ICD by Kelly Graciela Husbands for bradycardia dependant PMVT     No Known Allergies    No family history on file.   Social History Ms. Cina reports that she has never smoked. She has never used smokeless tobacco. Ms. Vignola reports that she does not drink alcohol.   Review of Systems CONSTITUTIONAL: No weight loss, fever, chills, weakness or fatigue.  HEENT: Eyes: No visual loss, blurred vision, double vision or yellow sclerae.No hearing loss, sneezing, congestion, runny nose or sore throat.  SKIN: No rash or itching.  CARDIOVASCULAR:  RESPIRATORY: No shortness of breath, cough or sputum.  GASTROINTESTINAL: No anorexia, nausea, vomiting or diarrhea. No abdominal pain or blood.  GENITOURINARY: No burning on urination, no polyuria NEUROLOGICAL: No headache, dizziness, syncope, paralysis, ataxia, numbness or tingling in the extremities. No change in bowel or  bladder control.  MUSCULOSKELETAL: No muscle, back pain, joint pain or stiffness.  LYMPHATICS: No enlarged nodes. No history of splenectomy.  PSYCHIATRIC: No history of depression or anxiety.  ENDOCRINOLOGIC: No reports of sweating, cold or heat intolerance. No polyuria or polydipsia.  Marland Kitchen   Physical Examination There were no vitals filed for this visit. There were no vitals filed for this visit.  Gen: resting comfortably, no acute distress HEENT: no scleral icterus, pupils equal round and reactive, no palptable cervical adenopathy,  CV Resp: Clear to auscultation bilaterally GI: abdomen is soft, non-tender, non-distended, normal bowel sounds, no hepatosplenomegaly MSK: extremities are warm, no edema.  Skin: warm, no rash Neuro:  no focal deficits Psych: appropriate affect   Diagnostic Studies  04/2012 Cath Hemodynamics:   RA: 4/4 RV: 30/3 PCWP: 9/4 PA: 29/11  Cardiac Output  Thermodilution:  3.39Index of 2.1 Fick : 3.78Index 1.88  Arterial Sat: 94% PA Sat: 60%.  LV pressure: 151/8 Aortic pressure: 152/67  Angiography   Left Main: smooth and normal  Left anterior Descending: Moderate disease in the proximal segment between 50-70%. The LAD gives off a small diagonal and then has a lucent 70% stenosis. Competitive filling can be seen coming from the IMA graft.  Ramus Intermediate : The ramus intermediate is a moderate vessel and is occluded at it's mid point.   Left Circumflex: The LCx is a small - moderate sized vessel . The OM branches are occluded.  Right Coronary Artery: Large and dominant. The entire RCA is moderately diffusely diseased. There is a tortous 40% stenosis in the proximal vessel. The mid RCA has a tight 95% stenosis. There is brisk flow through this stenosis. The distal RCA has a 30-40% stenosis . The PDA has a 70% stenosis in the proximal aspect of the Aysel Gilchrest. The  PLSA has no significant disease.  SVG to Ramus : The more superior graft is assumed to be to the Ramus Icey Tello. Nice graft. No stenosis in the graft or native vessel ( assumed to be Ramus)  SVG to Obtuse Marginal : ( the inferior graft) Normal graft. Normal native vessel.  LIMA to LAD: Normal graft. The anastomosis is normal. There is competitive flow from the native LAD.  LV Gram: done with hand injection of 7 cc contrast. Severe LV dysfunction. EF 25-30%  Complications: No apparent complications Patient did tolerate procedure well.  Contrast used: 92 cc   Conclusions:  1. Severe native CAD . She has patent grafts to the LAD, OM, and assumed ramus Coleby Yett. The native RCA does not have any grafts and has a tight 95% stenosis in the mid segment.   The patient was not consented for PCI prior to arriving in the cath lab and she had already been given valium. We will treat with heparin overnight and plan PCI on Tuesday.   In addition, there was some concern about her renal function and limiting contrast. Staging the PCI will allow her to clear all of the contrast given and minimize risk of contrast induced nephropathy.  2. Severe LV dysfunction: She has an EF of 25-30 %. Her filling pressures are low / normal. I do not think she needs any further diuresis over what she is getting currently.     03/2012 Echo LVEF 20-25%, grade II diastolic dysfunction, mod MR,   02/2015 Echo Study Conclusions  - Left ventricle: The cavity size was normal. Wall thickness was increased in a pattern of moderate LVH. Systolic function was severely reduced. The estimated ejection fraction was in the range of 20% to 25%. Diffuse hypokinesis. Features are consistent with a pseudonormal left ventricular filling pattern, with concomitant abnormal relaxation and increased filling pressure (grade 2 diastolic dysfunction). - Aortic valve: Mildly calcified annulus. Trileaflet;  mildly thickened leaflets. Valve area (VTI): 1.73 cm^2. Valve area (Vmax): 1.73 cm^2. Valve area (Vmean): 1.92 cm^2. - Mitral valve: Mildly calcified annulus. Mildly thickened leaflets . There was moderate regurgitation. The MR vena contracta is 0.5 cm. - Left atrium: The atrium was moderately to severely dilated. - Right ventricle: The cavity size was mildly dilated. - Right atrium: The atrium was mildly dilated. - Tricuspid valve: There was mild-moderate regurgitation. - Technically adequate study.  02/2015 Carotid US 1-39% bilateral ICA stenosis. Left subclavian stenosis in setting of LIMA graft.        Assessment and Plan  1. CAD/ICM/Chronic systolic HF - no current symptoms, continue current meds   2. OSA screen - will place referral for OSA evaluation by Kelly Juanetta Gosling. .    3. VT - followed by EP, no recent symptoms. Continue to follow  3. HTN - at goal, continue current meds  4. Hyperlipidemia - continue statin., request pcp labs     Antoine Poche, M.D., F.A.C.C.

## 2016-02-05 ENCOUNTER — Encounter: Payer: Self-pay | Admitting: Cardiology

## 2016-02-05 ENCOUNTER — Ambulatory Visit (INDEPENDENT_AMBULATORY_CARE_PROVIDER_SITE_OTHER): Payer: Medicare FFS | Admitting: Cardiology

## 2016-02-05 ENCOUNTER — Encounter: Payer: Self-pay | Admitting: *Deleted

## 2016-02-05 VITALS — BP 166/75 | HR 60 | Ht 65.0 in | Wt 170.0 lb

## 2016-02-05 DIAGNOSIS — I5022 Chronic systolic (congestive) heart failure: Secondary | ICD-10-CM

## 2016-02-05 DIAGNOSIS — I2510 Atherosclerotic heart disease of native coronary artery without angina pectoris: Secondary | ICD-10-CM | POA: Diagnosis not present

## 2016-02-05 DIAGNOSIS — I4729 Other ventricular tachycardia: Secondary | ICD-10-CM

## 2016-02-05 DIAGNOSIS — I472 Ventricular tachycardia: Secondary | ICD-10-CM | POA: Diagnosis not present

## 2016-02-05 DIAGNOSIS — E785 Hyperlipidemia, unspecified: Secondary | ICD-10-CM | POA: Diagnosis not present

## 2016-02-05 DIAGNOSIS — I10 Essential (primary) hypertension: Secondary | ICD-10-CM

## 2016-02-05 MED ORDER — SACUBITRIL-VALSARTAN 49-51 MG PO TABS: 1.0000 | ORAL_TABLET | Freq: Two times a day (BID) | ORAL | Status: DC

## 2016-02-05 NOTE — Patient Instructions (Signed)
Your physician recommends that you schedule a follow-up appointment in: 2 MONTHS WITH DR. BRANCH   Your physician has recommended you make the following change in your medication:   STOP LISINOPRIL   2 DAYS AFTER STOPPING LISINOPRIL START ENTRESTO 49/51 MG TWICE DAILY  TAKE LASIX 40 MG TWICE DAILY FOR 2 DAYS THEN GO BACK TO ONCE DAILY  Thank you for choosing [facility]!!

## 2016-02-05 NOTE — Progress Notes (Signed)
Clinical Summary [name] is a 81 y.o.female seen today for follow up of the following medical problems.   1. CAD/ICM/Chronic systolic HF - hx of CABG in 2009 (LIMA-LAD, SVG-ramus, SVG-OM). Hx of RCA stent 04/2012 - she reports a more recent stent [place].She remains on plavix  02/2015 echo LVEF 20-25%, grade II diastolic dysfunction - she has CRT-D device followed by EP   - denies any SOB or DOE. Can have some occasional LE edema.  - compliant with meds  2. OSA screen - + snoring, occasional apneic episodes, daytime fatigue. - she missed appointment with [name] and has not rescheduled.   3. VT - [facility] with [facility], found to be in VT that was converted twice by her ICD. K at that time was 2.8. She was started on amio gtt and transferred to [facility] - she also developed acute pulmonary edema requiring bipap and diuresis - ICD interrogation showed 9 VF events, 1 pace terminated and 3 shock terminated episodes, 6 aborted charges. 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 EP notes also mention complete heart block with brady dependent torsades.  - she underwent ICD upgrade of single chamber ICD to [facility] CRT-D  - denies any palpitations since last visit  4. HTN - checks at home, typically 130s/60s - compliant with meds  5. Hyperlipidemia - compliant with statin.  Past Medical History  Diagnosis Date  . Coronary artery disease     a. 2009 CABG x 3(LIMA->LAD, VG->RAMUS, VG->OM;  b. 04/2012 Cath/PCI: LM nl, LAD 50-70p,3961m, [place] 13457m, LCX occluded OM's, RCA 379m, 30-40d, PDA 70p, VG->RI nl, VG->OM nl, LIMA->LAD nl, EF 25-30%. RCA stented w/ 2.25x28 & 2.5x308mm Xpedition DES'.   . Hypertension   . Hyperlipidemia   . Type II diabetes mellitus (HCC)   . Alzheimer's dementia   . Chronic systolic CHF (congestive heart failure) (HCC)     a. 04/2012 EF 25-30%,  viable myocardium by rest thallium redistribution   . S/P ICD (internal cardiac defibrillator) procedure, with upgrade to ICD/CRT-D  Medtronic 11/06/2014  . Polymorphic ventricular tachycardia (HCC)   . Ischemic cardiomyopathy      No Known Allergies   Current Outpatient Prescriptions  Medication Sig Dispense Refill  . acetaminophen (TYLENOL) 325 MG tablet Take 1-2 tablets (325-650 mg total) by mouth every 4 (four) hours as needed for mild pain.    Marland Kitchen. aspirin EC 81 MG tablet Take 81 mg by mouth daily.    Marland Kitchen. atorvastatin (LIPITOR) 40 MG tablet Take 1 tablet (40 mg total) by mouth daily at 6 PM. 30 tablet 11  . carvedilol (COREG) 25 MG tablet Take 1 tablet (25 mg total) by mouth 2 (two) times daily. 180 tablet 3  . clopidogrel (PLAVIX) 75 MG tablet Take 1 tablet (75 mg total) by mouth daily. 30 tablet 11  . ferrous gluconate (FERGON) 324 MG tablet Take 324 mg by mouth daily.  1  . ferrous sulfate 325 (65 FE) MG tablet TAKE 1 TABLET BY MOUTH EVERY DAY (NEEDS APPOINTMENT FOR MORE REFILLS) 30 tablet 6  . furosemide (LASIX) 40 MG tablet Take 1 tablet (40 mg total) by mouth daily. 30 tablet 6  . glyBURIDE (DIABETA) 5 MG tablet Take 5 mg by mouth daily with breakfast.     . hydrALAZINE (APRESOLINE) 50 MG tablet Take 1 tablet (50 mg total) by mouth  every 8 (eight) hours. 90 tablet 11  . isosorbide mononitrate (IMDUR) 30 MG 24 hr tablet Take 1 tablet (30 mg total) by mouth daily. 30 tablet 2  . levothyroxine (SYNTHROID, LEVOTHROID) 25 MCG tablet Take 1 tablet (25 mcg total) by mouth daily before breakfast. 30 tablet 6  . lisinopril (PRINIVIL,ZESTRIL) 40 MG tablet Take 1 tablet (40 mg total) by mouth daily. 90 tablet 3  . meclizine (ANTIVERT) 25 MG tablet Take 25 mg by mouth 3 (three) times daily as needed for dizziness.    . memantine (NAMENDA) 10 MG tablet Take 10 mg by mouth 2 (two) times daily.  5  . nitroGLYCERIN (NITROSTAT) 0.4 MG SL tablet Place 1 tablet (0.4 mg total) under the tongue every 5  (five) minutes x 3 doses as needed for chest pain. 25 tablet 3  . spironolactone (ALDACTONE) 25 MG tablet TAKE 1 TABLET (25 MG TOTAL) BY MOUTH DAILY. 30 tablet 11   No current facility-administered medications for this visit.     Past Surgical History  Procedure Laterality Date  . Coronary artery bypass graft  ~ 2009    CABG X1  . Goiter removed    . Cardiac catheterization  04/07/2012  . Left and right heart catheterization with coronary/graft angiogram N/A 04/07/2012    Procedure: LEFT AND RIGHT HEART CATHETERIZATION WITH Isabel CapriceORONARY/GRAFT ANGIOGRAM;  Surgeon: Kelly MixerPhilip J Nahser, [name];  [facility];  Service: Cardiovascular;  Laterality: N/A;  . Percutaneous coronary stent intervention (pci-s) N/A 04/11/2012    Procedure: PERCUTANEOUS CORONARY STENT INTERVENTION (PCI-S);  Surgeon: Kelly Abrahamhomas D Stuckey, [name];  [facility];  Service: Cardiovascular;  Laterality: N/A;  . Bi-ventricular implantable cardioverter defibrillator upgrade N/A 11/04/2014    Initial ICD implanted in [place] by [name].  Upgraded to MDT BiV ICD by [name] for bradycardia dependant PMVT     No Known Allergies    Family History Parents and 3 brothers decased, she is unaware of any known health issues.    Social History [name] reports that she has never smoked. She has never used smokeless tobacco. [name] reports that she does not drink alcohol.   Review of Systems CONSTITUTIONAL: No weight loss, fever, chills, weakness or fatigue.  HEENT: Eyes: No visual loss, blurred vision, double vision or yellow sclerae.No hearing loss, sneezing, congestion, runny nose or sore throat.  SKIN: No rash or itching.  CARDIOVASCULAR: per HPI RESPIRATORY: No shortness of breath, cough or sputum.  GASTROINTESTINAL: No anorexia, nausea, vomiting or diarrhea. No abdominal pain or blood.  GENITOURINARY: No burning on urination, no polyuria NEUROLOGICAL: No headache, dizziness, syncope, paralysis, ataxia, numbness  or tingling in the extremities. No change in bowel or bladder control.  MUSCULOSKELETAL: No muscle, back pain, joint pain or stiffness.  LYMPHATICS: No enlarged nodes. No history of splenectomy.  PSYCHIATRIC: No history of depression or anxiety.  ENDOCRINOLOGIC: No reports of sweating, cold or heat intolerance. No polyuria or polydipsia.  Marland Kitchen.   Physical Examination Filed Vitals:   02/05/16 1102  BP: 166/75  Pulse: 60   Filed Vitals:   02/05/16 1102  Height: 5\' 5"  (1.651 m)  Weight: 170 lb (77.111 kg)    Gen: resting comfortably, no acute distress HEENT: no scleral icterus, pupils equal round and reactive, no palptable cervical adenopathy,  CV: RRR, no m/r/g, no jvd Resp: Clear to auscultation bilaterally GI: abdomen is soft, non-tender, non-distended, normal bowel sounds, no hepatosplenomegaly MSK: extremities are warm, trace bilateral edema  Skin: warm,  no rash Neuro:  no focal deficits Psych: appropriate affect   Diagnostic Studies 04/2012 Cath Hemodynamics:   RA: 4/4 RV: 30/3 PCWP: 9/4 PA: 29/11  Cardiac Output  Thermodilution: 3.39Index of 2.1 Fick : 3.78Index 1.88  Arterial Sat: 94% PA Sat: 60%.  LV pressure: 151/8 Aortic pressure: 152/67  Angiography   Left Main: smooth and normal  Left anterior Descending: Moderate disease in the proximal segment between 50-70%. The LAD gives off a small diagonal and then has a lucent 70% stenosis. Competitive filling can be seen coming from the IMA graft.  Ramus Intermediate : The ramus intermediate is a moderate vessel and is occluded at it's mid point.   Left Circumflex: The LCx is a small - moderate sized vessel . The OM branches are occluded.  Right Coronary Artery: Large and dominant. The entire RCA is moderately diffusely diseased. There is a tortous 40% stenosis in the proximal vessel. The mid RCA has a tight 95% stenosis. There  is brisk flow through this stenosis. The distal RCA has a 30-40% stenosis . The PDA has a 70% stenosis in the proximal aspect of the Kimmie Berggren. The PLSA has no significant disease.  SVG to Ramus : The more superior graft is assumed to be to the Ramus Shamyra Farias. Nice graft. No stenosis in the graft or native vessel ( assumed to be Ramus)  SVG to Obtuse Marginal : ( the inferior graft) Normal graft. Normal native vessel.  LIMA to LAD: Normal graft. The anastomosis is normal. There is competitive flow from the native LAD.  LV Gram: done with hand injection of 7 cc contrast. Severe LV dysfunction. EF 25-30%  Complications: No apparent complications Patient did tolerate procedure well.  Contrast used: 92 cc   Conclusions:  1. Severe native CAD . She has patent grafts to the LAD, OM, and assumed ramus Patty Lopezgarcia. The native RCA does not have any grafts and has a tight 95% stenosis in the mid segment.   The patient was not consented for PCI prior to arriving in the cath lab and she had already been given valium. We will treat with heparin overnight and plan PCI on Tuesday.   In addition, there was some concern about her renal function and limiting contrast. Staging the PCI will allow her to clear all of the contrast given and minimize risk of contrast induced nephropathy.  2. Severe LV dysfunction: She has an EF of 25-30 %. Her filling pressures are low / normal. I do not think she needs any further diuresis over what she is getting currently.     03/2012 Echo LVEF 20-25%, grade II diastolic dysfunction, mod MR,   02/2015 Echo Study Conclusions  - Left ventricle: The cavity size was normal. Wall thickness was increased in a pattern of moderate LVH. Systolic function was severely reduced. The estimated ejection fraction was in the range of 20% to 25%. Diffuse hypokinesis. Features are consistent with a pseudonormal left ventricular filling pattern, with concomitant  abnormal relaxation and increased filling pressure (grade 2 diastolic dysfunction). - Aortic valve: Mildly calcified annulus. Trileaflet; mildly thickened leaflets. Valve area (VTI): 1.73 cm^2. Valve area (Vmax): 1.73 cm^2. Valve area (Vmean): 1.92 cm^2. - Mitral valve: Mildly calcified annulus. Mildly thickened leaflets . There was moderate regurgitation. The MR vena contracta is 0.5 cm. - Left atrium: The atrium was moderately to severely dilated. - Right ventricle: The cavity size was mildly dilated. - Right atrium: The atrium was mildly dilated. - Tricuspid valve: There was mild-moderate  regurgitation. - Technically adequate study.  02/2015 Carotid US 1-39% bilateral ICA stenosis. Left subclavian stenosis in setting of LIMA graft.        Assessment and Plan   1. CAD/ICM/Chronic systolic HF - no current symptoms - we will d/c lisionrpril, after 48hrs instructed to start entresto 49/51mg  bid. Titrate at next visit, once optimized consider aldactone.  - mild edema, she will take lasix  bid x 2 days then resume  daily.   2. OSA screen -asked to schedule f/u with Kelly Juanetta Gosling.  .   3. VT - followed by EP, no recent symptoms.  - we will continue to monitor  3. HTN - elevated in clinic, we will monitor with change to entresto  4. Hyperlipidemia - continue statin., request last labs from pcp  F/u 2 months   Antoine Poche, M.D.

## 2016-02-12 ENCOUNTER — Telehealth: Payer: Self-pay | Admitting: *Deleted

## 2016-02-12 MED ORDER — SACUBITRIL-VALSARTAN 49-51 MG PO TABS: 1.0000 | ORAL_TABLET | Freq: Two times a day (BID) | ORAL | Status: DC

## 2016-02-12 NOTE — Telephone Encounter (Signed)
[employer] approved Entresto through 02/11/18. Approval form sent to be scanned into chart

## 2016-03-14 ENCOUNTER — Other Ambulatory Visit: Payer: Self-pay | Admitting: Cardiology

## 2016-04-14 ENCOUNTER — Encounter: Payer: Self-pay | Admitting: Cardiology

## 2016-04-14 ENCOUNTER — Ambulatory Visit (INDEPENDENT_AMBULATORY_CARE_PROVIDER_SITE_OTHER): Payer: Medicare FFS | Admitting: Cardiology

## 2016-04-14 VITALS — BP 170/76 | HR 69 | Ht 65.0 in | Wt 171.4 lb

## 2016-04-14 DIAGNOSIS — I5022 Chronic systolic (congestive) heart failure: Secondary | ICD-10-CM

## 2016-04-14 DIAGNOSIS — R2681 Unsteadiness on feet: Secondary | ICD-10-CM | POA: Diagnosis not present

## 2016-04-14 DIAGNOSIS — G4730 Sleep apnea, unspecified: Secondary | ICD-10-CM | POA: Diagnosis not present

## 2016-04-14 DIAGNOSIS — I10 Essential (primary) hypertension: Secondary | ICD-10-CM

## 2016-04-14 DIAGNOSIS — I4729 Other ventricular tachycardia: Secondary | ICD-10-CM

## 2016-04-14 DIAGNOSIS — I472 Ventricular tachycardia: Secondary | ICD-10-CM

## 2016-04-14 MED ORDER — SACUBITRIL-VALSARTAN 97-103 MG PO TABS: 1.0000 | ORAL_TABLET | Freq: Two times a day (BID) | ORAL | 3 refills | Status: DC

## 2016-04-14 NOTE — Progress Notes (Signed)
Clinical Summary [name] is a 81 y.o.female seen today for follow up of the following medical problems.   1. CAD/ICM/Chronic systolic HF - hx of CABG in 2009 (LIMA-LAD, SVG-ramus, SVG-OM). Hx of RCA stent 04/2012 - she reports a more recent stent placed in [place].She remains on plavix  02/2015 echo LVEF 20-25%, grade II diastolic dysfunction - she has CRT-D device followed by EP   - last visit changed lisinopril to entresto. Tolerating well without side effects. No recent SOB, DOE, or LE edema   2. OSA screen - + snoring, occasional apneic episodes, daytime fatigue. - she has not scheduled her appt with sleep medicine yet.   3. VT - presented to [facility] 10/2014 with SOB, found to be in VT that was converted twice by her ICD. K at that time was 2.8. She was started on amio gtt and transferred to [facility] - she also developed acute pulmonary edema requiring bipap and diuresis - ICD interrogation showed 9 VF events, 1 pace terminated and 3 shock terminated episodes, 6 aborted charges. 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 EP notes also mention complete heart block with brady dependent torsades.  - she underwent ICD upgrade of single chamber ICD to [facility] CRT-D  - denies any palpitations since our last visit  4. HTN - checks at home, typically 130s/60s - compliant with meds  5. Hyperlipidemia - compliant with statin.  Past Medical History:  Diagnosis Date  . Alzheimer's dementia   . Chronic systolic CHF (congestive heart failure) (HCC)    a. 04/2012 EF 25-30%, viable myocardium by rest thallium redistribution   . Coronary artery disease    a. 2009 CABG x 3(LIMA->LAD, VG->RAMUS, VG->OM;  b. 04/2012 Cath/PCI: LM nl, LAD 50-70p,6m, RI 15m, LCX occluded OM's, RCA 64m, 30-40d, PDA 70p, VG->RI nl, VG->OM nl, LIMA->LAD nl, EF 25-30%. RCA stented w/ 2.25x28 & 2.5x28mm Xpedition  DES'.   . Hyperlipidemia   . Hypertension   . Ischemic cardiomyopathy   . Polymorphic ventricular tachycardia (HCC)   . S/P ICD (internal cardiac defibrillator) procedure, with upgrade to ICD/CRT-D  Medtronic 11/06/2014  . Type II diabetes mellitus (HCC)      No Known Allergies   Current Outpatient Prescriptions  Medication Sig Dispense Refill  . acetaminophen (TYLENOL) 325 MG tablet Take 1-2 tablets (325-650 mg total) by mouth every 4 (four) hours as needed for mild pain.    Marland Kitchen aspirin EC 81 MG tablet Take 81 mg by mouth daily.    Marland Kitchen atorvastatin (LIPITOR) 40 MG tablet Take 1 tablet (40 mg total) by mouth daily at 6 PM. 30 tablet 11  . carvedilol (COREG) 25 MG tablet Take 1 tablet (25 mg total) by mouth 2 (two) times daily. 180 tablet 3  . clopidogrel (PLAVIX) 75 MG tablet Take 1 tablet (75 mg total) by mouth daily. 30 tablet 11  . ferrous sulfate 325 (65 FE) MG tablet TAKE 1 TABLET BY MOUTH EVERY DAY (NEEDS APPOINTMENT FOR MORE REFILLS) 30 tablet 6  . furosemide (LASIX) 40 MG tablet Take 1 tablet (40 mg total) by mouth daily. 30 tablet 6  . hydrALAZINE (APRESOLINE) 50 MG tablet Take 1 tablet (50 mg total) by mouth every 8 (eight) hours. 90 tablet 11  . levothyroxine (SYNTHROID, LEVOTHROID) 25 MCG tablet Take 1 tablet (25 mcg total) by mouth daily before breakfast. 30 tablet 6  . meclizine (  ANTIVERT) 25 MG tablet Take 25 mg by mouth 3 (three) times daily as needed for dizziness.    . memantine (NAMENDA) 10 MG tablet Take 10 mg by mouth 2 (two) times daily.  5  . nitroGLYCERIN (NITROLINGUAL) 0.4 MG/SPRAY spray Place 1 spray under the tongue every 5 (five) minutes x 3 doses as needed for chest pain.    . sacubitril-valsartan (ENTRESTO) 49-51 MG Take 1 tablet by mouth 2 (two) times daily. 180 tablet 3   No current facility-administered medications for this visit.      Past Surgical History:  Procedure Laterality Date  . BI-VENTRICULAR IMPLANTABLE CARDIOVERTER DEFIBRILLATOR UPGRADE N/A  11/04/2014   Initial ICD implanted in [place] by Dr Levonne Spiller.  Upgraded to MDT BiV ICD by Dr Graciela Husbands for bradycardia dependant PMVT  . CARDIAC CATHETERIZATION  04/07/2012  . CORONARY ARTERY BYPASS GRAFT  ~ 2009   CABG X1  . goiter removed    . LEFT AND RIGHT HEART CATHETERIZATION WITH CORONARY/GRAFT ANGIOGRAM N/A 04/07/2012   Procedure: LEFT AND RIGHT HEART CATHETERIZATION WITH Isabel Caprice;  Surgeon: Vesta Mixer, MD;  Location: [facility] CATH LAB;  Service: Cardiovascular;  Laterality: N/A;  . PERCUTANEOUS CORONARY STENT INTERVENTION (PCI-S) N/A 04/11/2012   Procedure: PERCUTANEOUS CORONARY STENT INTERVENTION (PCI-S);  Surgeon: Herby Abraham, MD;  Location: [facility] CATH LAB;  Service: Cardiovascular;  Laterality: N/A;     No Known Allergies    No family history on file.   Social History [name] reports that she has never smoked. She has never used smokeless tobacco. [name] reports that she does not drink alcohol.   Review of Systems CONSTITUTIONAL: No weight loss, fever, chills, weakness or fatigue.  HEENT: Eyes: No visual loss, blurred vision, double vision or yellow sclerae.No hearing loss, sneezing, congestion, runny nose or sore throat.  SKIN: No rash or itching.  CARDIOVASCULAR: per HPI RESPIRATORY: No shortness of breath, cough or sputum.  GASTROINTESTINAL: No anorexia, nausea, vomiting or diarrhea. No abdominal pain or blood.  GENITOURINARY: No burning on urination, no polyuria NEUROLOGICAL: No headache, dizziness, syncope, paralysis, ataxia, numbness or tingling in the extremities. No change in bowel or bladder control.  MUSCULOSKELETAL: No muscle, back pain, joint pain or stiffness.  LYMPHATICS: No enlarged nodes. No history of splenectomy.  PSYCHIATRIC: No history of depression or anxiety.  ENDOCRINOLOGIC: No reports of sweating, cold or heat intolerance. No polyuria or polydipsia.  Marland Kitchen   Physical Examination Vitals:   04/14/16 1356  BP: (!) 170/76  Pulse:  69   Vitals:   04/14/16 1356  Weight: 171 lb 6.4 oz (77.7 kg)  Height: 5\' 5"  (1.651 m)    Gen: resting comfortably, no acute distress HEENT: no scleral icterus, pupils equal round and reactive, no palptable cervical adenopathy,  CV: RRR, no m/r/g, no jvd Resp: Clear to auscultation bilaterally GI: abdomen is soft, non-tender, non-distended, normal bowel sounds, no hepatosplenomegaly MSK: extremities are warm, no edema.  Skin: warm, no rash Neuro:  no focal deficits Psych: appropriate affect   Diagnostic Studies 04/2012 Cath Hemodynamics:   RA: 4/4 RV: 30/3 PCWP: 9/4 PA: 29/11  Cardiac Output  Thermodilution: 3.39Index of 2.1 Fick : 3.78Index 1.88  Arterial Sat: 94% PA Sat: 60%.  LV pressure: 151/8 Aortic pressure: 152/67  Angiography   Left Main: smooth and normal  Left anterior Descending: Moderate disease in the proximal segment between 50-70%. The LAD gives off a small diagonal and then has a lucent 70% stenosis. Competitive filling can be seen  coming from the IMA graft.  Ramus Intermediate : The ramus intermediate is a moderate vessel and is occluded at it's mid point.   Left Circumflex: The LCx is a small - moderate sized vessel . The OM branches are occluded.  Right Coronary Artery: Large and dominant. The entire RCA is moderately diffusely diseased. There is a tortous 40% stenosis in the proximal vessel. The mid RCA has a tight 95% stenosis. There is brisk flow through this stenosis. The distal RCA has a 30-40% stenosis . The PDA has a 70% stenosis in the proximal aspect of the Myria Steenbergen. The PLSA has no significant disease.  SVG to Ramus : The more superior graft is assumed to be to the Ramus Teila Skalsky. Nice graft. No stenosis in the graft or native vessel ( assumed to be Ramus)  SVG to Obtuse Marginal : ( the inferior graft) Normal graft. Normal native  vessel.  LIMA to LAD: Normal graft. The anastomosis is normal. There is competitive flow from the native LAD.  LV Gram: done with hand injection of 7 cc contrast. Severe LV dysfunction. EF 25-30%  Complications: No apparent complications Patient did tolerate procedure well.  Contrast used: 92 cc   Conclusions:  1. Severe native CAD . She has patent grafts to the LAD, OM, and assumed ramus Vidit Boissonneault. The native RCA does not have any grafts and has a tight 95% stenosis in the mid segment.   The patient was not consented for PCI prior to arriving in the cath lab and she had already been given valium. We will treat with heparin overnight and plan PCI on Tuesday.   In addition, there was some concern about her renal function and limiting contrast. Staging the PCI will allow her to clear all of the contrast given and minimize risk of contrast induced nephropathy.  2. Severe LV dysfunction: She has an EF of 25-30 %. Her filling pressures are low / normal. I do not think she needs any further diuresis over what she is getting currently.     03/2012 Echo LVEF 20-25%, grade II diastolic dysfunction, mod MR,   02/2015 Echo Study Conclusions  - Left ventricle: The cavity size was normal. Wall thickness was increased in a pattern of moderate LVH. Systolic function was severely reduced. The estimated ejection fraction was in the range of 20% to 25%. Diffuse hypokinesis. Features are consistent with a pseudonormal left ventricular filling pattern, with concomitant abnormal relaxation and increased filling pressure (grade 2 diastolic dysfunction). - Aortic valve: Mildly calcified annulus. Trileaflet; mildly thickened leaflets. Valve area (VTI): 1.73 cm^2. Valve area (Vmax): 1.73 cm^2. Valve area (Vmean): 1.92 cm^2. - Mitral valve: Mildly calcified annulus. Mildly thickened leaflets . There was moderate regurgitation. The MR vena contracta is  0.5 cm. - Left atrium: The atrium was moderately to severely dilated. - Right ventricle: The cavity size was mildly dilated. - Right atrium: The atrium was mildly dilated. - Tricuspid valve: There was mild-moderate regurgitation. - Technically adequate study.  02/2015 Carotid US 1-39% bilateral ICA stenosis. Left subclavian stenosis in setting of LIMA graft.       Assessment and Plan  1. CAD/ICM/Chronic systolic HF - no current symptoms - we will increase entresto to 97/103mg  bid. Check labs in 1 week. Consider aldactone at next visit.   2. OSA screen -I asked her  to schedule f/u with Dr Juanetta Gosling.  .   3. VT - followed by EP, no recent symptoms.   3. HTN - elevated in clinic,  we will monitor with increased entresto dose.   4. Hyperlipidemia - continue statin.  5. Gait instability - place referral to PT    F/u 6 weeks      Antoine PocheJonathan F. Victorious Kundinger, M.D.

## 2016-04-14 NOTE — Patient Instructions (Addendum)
Your physician recommends that you schedule a follow-up appointment in: 6 WEEKS WITH DR. BRANCH  Your physician has recommended you make the following change in your medication:   INCREASE ENTRESTO 97/103 MG TWICE DAILY  You have been referred to PHYSICAL THERAPY AND DR. HAWKINS   Your physician recommends that you return for lab work in: 1 WEEK BMP/MG  Thank you for choosing [facility]!!

## 2016-04-30 ENCOUNTER — Telehealth: Payer: Self-pay | Admitting: *Deleted

## 2016-04-30 NOTE — Telephone Encounter (Signed)
[facility] called - pt declined to participate in physical therapy (gait) due to co pay of $40 each time. Will forward to Dr. Melony OverlyBranch FYI

## 2016-05-26 ENCOUNTER — Encounter: Payer: Self-pay | Admitting: *Deleted

## 2016-05-27 ENCOUNTER — Ambulatory Visit: Payer: Medicare FFS | Admitting: Cardiology

## 2016-05-27 ENCOUNTER — Encounter: Payer: Self-pay | Admitting: Cardiology

## 2016-05-27 NOTE — Progress Notes (Deleted)
Clinical Summary [name] is a 81 y.o.female seen today for follow up of the following medical problems.   1. CAD/ICM/Chronic systolic HF - hx of CABG in 2009 (LIMA-LAD, SVG-ramus, SVG-OM). Hx of RCA stent 04/2012 - she reports a more recent [place]She remains on plavix  02/2015 echo LVEF 20-25%, grade II diastolic dysfunction - she has CRT-D device followed by EP   - last visit changed lisinopril to entresto. Tolerating well without side effects. No recent SOB, DOE, or LE edema  - last visit increased entresto to 97/103 mg bid.  ? Aldactone?  2. OSA screen - + snoring, occasional apneic episodes, daytime fatigue. - she has not scheduled her appt with sleep medicine yet.   3. VT - [facility] with SOB, found to be in VT that was converted twice by her ICD. K at that time was 2.8. She was started on amio gtt and transferred to [facility] - she also developed acute pulmonary edema requiring bipap and diuresis - ICD interrogation showed 9 VF events, 1 pace terminated and 3 shock terminated episodes, 6 aborted charges. 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 EP notes also mention complete heart block with brady dependent torsades.  - she underwent ICD upgrade of single chamber ICD to [facility] CRT-D  - denies any palpitations since our last visit  4. HTN - checks at home, typically 130s/60s - compliant with meds  5. Hyperlipidemia - compliant with statin.  Past Medical History:  Diagnosis Date  . Alzheimer's dementia   . Chronic systolic CHF (congestive heart failure) (HCC)    a. 04/2012 EF 25-30%, viable myocardium by rest thallium redistribution   . Coronary artery disease    a. 2009 CABG x 3(LIMA->LAD, VG->RAMUS, VG->OM;  b. 04/2012 Cath/PCI: LM nl, LAD 50-70p,2273m, RI 13347m, LCX occluded OM's, RCA 3062m, 30-40d, PDA 70p, VG->RI nl, VG->OM nl,  LIMA->LAD nl, EF 25-30%. RCA stented w/ 2.25x28 & 2.5x108mm Xpedition DES'.   . Hyperlipidemia   . Hypertension   . Ischemic cardiomyopathy   . Polymorphic ventricular tachycardia (HCC)   . S/P ICD (internal cardiac defibrillator) procedure, with upgrade to ICD/CRT-D  [employer]tronic 11/06/2014  . Type II diabetes mellitus (HCC)      No Known Allergies   Current Outpatient Prescriptions  Medication Sig Dispense Refill  . acetaminophen (TYLENOL) 325 MG tablet Take 1-2 tablets (325-650 mg total) by mouth every 4 (four) hours as needed for mild pain.    Marland Kitchen. aspirin EC 81 MG tablet Take 81 mg by mouth daily.    Marland Kitchen. atorvastatin (LIPITOR) 40 MG tablet Take 1 tablet (40 mg total) by mouth daily at 6 PM. 30 tablet 11  . carvedilol (COREG) 25 MG tablet Take 1 tablet (25 mg total) by mouth 2 (two) times daily. 180 tablet 3  . clopidogrel (PLAVIX) 75 MG tablet Take 1 tablet (75 mg total) by mouth daily. 30 tablet 11  . ferrous sulfate 325 (65 FE) MG tablet TAKE 1 TABLET BY MOUTH EVERY DAY (NEEDS APPOINTMENT FOR MORE REFILLS) 30 tablet 6  . furosemide (LASIX) 40 MG tablet Take 1 tablet (40 mg total) by mouth daily. 30 tablet 6  . hydrALAZINE (APRESOLINE) 50 MG tablet Take 1 tablet (50 mg total) by mouth every 8 (eight) hours. 90 tablet 11  . levothyroxine (SYNTHROID, LEVOTHROID) 25 MCG tablet Take 1 tablet (25 mcg  total) by mouth daily before breakfast. 30 tablet 6  . meclizine (ANTIVERT) 25 MG tablet Take 25 mg by mouth 3 (three) times daily as needed for dizziness.    . memantine (NAMENDA) 10 MG tablet Take 10 mg by mouth 2 (two) times daily.  5  . nitroGLYCERIN (NITROLINGUAL) 0.4 MG/SPRAY spray Place 1 spray under the tongue every 5 (five) minutes x 3 doses as needed for chest pain.    . sacubitril-valsartan (ENTRESTO) 97-103 MG Take 1 tablet by mouth 2 (two) times daily. 60 tablet 3   No current facility-administered medications for this visit.      Past Surgical History:  Procedure Laterality Date  .  BI-VENTRICULAR IMPLANTABLE CARDIOVERTER DEFIBRILLATOR UPGRADE N/A 11/04/2014   Initial ICD implanted in [place] by Dr Levonne Spiller.  Upgraded to MDT BiV ICD by Dr Graciela Husbands for bradycardia dependant PMVT  . CARDIAC CATHETERIZATION  04/07/2012  . CORONARY ARTERY BYPASS GRAFT  ~ 2009   CABG X1  . goiter removed    . LEFT AND RIGHT HEART CATHETERIZATION WITH CORONARY/GRAFT ANGIOGRAM N/A 04/07/2012   Procedure: LEFT AND RIGHT HEART CATHETERIZATION WITH Isabel Caprice;  Surgeon: Vesta Mixer, MD;  Location[facility] C[facility] LAB;  Service: Cardiovascular;  Laterality: N/A;  . PERCUTANEOUS CORONARY STENT INTERVENTION (PCI-S) N/A 04/11/2012   Procedure: PERCUTANEOUS CORONARY STENT INTERVENTION (PCI-S);  Surgeon: Herby Abraham, MD;  Location: [facility] CATH LAB;  Service: Cardiovascular;  Laterality: N/A;     No Known Allergies    No family history on file.   Social History [name] reports that she has never smoked. She has never used smokeless tobacco. [name] reports that she does not drink alcohol.   Review of Systems CONSTITUTIONAL: No weight loss, fever, chills, weakness or fatigue.  HEENT: Eyes: No visual loss, blurred vision, double vision or yellow sclerae.No hearing loss, sneezing, congestion, runny nose or sore throat.  SKIN: No rash or itching.  CARDIOVASCULAR:  RESPIRATORY: No shortness of breath, cough or sputum.  GASTROINTESTINAL: No anorexia, nausea, vomiting or diarrhea. No abdominal pain or blood.  GENITOURINARY: No burning on urination, no polyuria NEUROLOGICAL: No headache, dizziness, syncope, paralysis, ataxia, numbness or tingling in the extremities. No change in bowel or bladder control.  MUSCULOSKELETAL: No muscle, back pain, joint pain or stiffness.  LYMPHATICS: No enlarged nodes. No history of splenectomy.  PSYCHIATRIC: No history of depression or anxiety.  ENDOCRINOLOGIC: No reports of sweating, cold or heat intolerance. No polyuria or polydipsia.  Marland Kitchen   Physical  Examination There were no vitals filed for this visit. There were no vitals filed for this visit.  Gen: resting comfortably, no acute distress HEENT: no scleral icterus, pupils equal round and reactive, no palptable cervical adenopathy,  CV Resp: Clear to auscultation bilaterally GI: abdomen is soft, non-tender, non-distended, normal bowel sounds, no hepatosplenomegaly MSK: extremities are warm, no edema.  Skin: warm, no rash Neuro:  no focal deficits Psych: appropriate affect   Diagnostic Studies 04/2012 Cath Hemodynamics:   RA:4/4 RV:30/3 PCWP:9/4 PA:29/11  Cardiac Output  Thermodilution:3.39Index of 2.1 Fick :3.78Index 1.88  Arterial Sat:94% PA Sat:60%.  LV pressure:151/8 Aortic pressure:152/67  Angiography   Left Main:smooth and normal  Left anterior Descending: Moderate disease in the proximal segment between 50-70%. The LAD gives off a small diagonal and then has a lucent 70% stenosis. Competitive filling can be seen coming from the IMA graft.  Ramus Intermediate : The ramus intermediate is a moderate vessel and is occluded at it's mid point.  Left Circumflex:The LCx is a small - moderate sized vessel . The OM branches are occluded.  Right Coronary Artery: Large and dominant. The entire RCA is moderately diffusely diseased. There is a tortous 40% stenosis in the proximal vessel. The mid RCA has a tight 95% stenosis. There is brisk flow through this stenosis. The distal RCA has a 30-40% stenosis . The PDA has a 70% stenosis in the proximal aspect of the Winfred Iiams. The PLSA has no significant disease.  SVG to Ramus : The more superior graft is assumed to be to the Ramus Marco Adelson. Nice graft. No stenosis in the graft or native vessel ( assumed to be Ramus)  SVG to Obtuse Marginal :( the inferior graft) Normal graft. Normal native vessel.  LIMA to LAD:  Normal graft. The anastomosis is normal. There is competitive flow from the native LAD.  LV Gram:done with hand injection of 7 cc contrast. Severe LV dysfunction. EF 25-30%  Complications: No apparent complications Patient did tolerate procedure well.  Contrast used:92 cc   Conclusions:  1. Severe native CAD . She has patent grafts to the LAD, OM, and assumed ramus Christiaan Strebeck. The native RCA does not have any grafts and has a tight 95% stenosis in the mid segment.   The patient was not consented for PCI prior to arriving in the cath lab and she had already been given valium. We will treat with heparin overnight and plan PCI on Tuesday.   In addition, there was some concern about her renal function and limiting contrast. Staging the PCI will allow her to clear all of the contrast given and minimize risk of contrast induced nephropathy.  2. Severe LV dysfunction:She has an EF of 25-30 %. Her filling pressures are low / normal. I do not think she needs any further diuresis over what she is getting currently.     03/2012 Echo LVEF 20-25%, grade II diastolic dysfunction, mod MR,   02/2015 Echo Study Conclusions  - Left ventricle: The cavity size was normal. Wall thickness was increased in a pattern of moderate LVH. Systolic function was severely reduced. The estimated ejection fraction was in the range of 20% to 25%. Diffuse hypokinesis. Features are consistent with a pseudonormal left ventricular filling pattern, with concomitant abnormal relaxation and increased filling pressure (grade 2 diastolic dysfunction). - Aortic valve: Mildly calcified annulus. Trileaflet; mildly thickened leaflets. Valve area (VTI): 1.73 cm^2. Valve area (Vmax): 1.73 cm^2. Valve area (Vmean): 1.92 cm^2. - Mitral valve: Mildly calcified annulus. Mildly thickened leaflets . There was moderate regurgitation. The MR vena contracta is 0.5 cm. - Left atrium: The  atrium was moderately to severely dilated. - Right ventricle: The cavity size was mildly dilated. - Right atrium: The atrium was mildly dilated. - Tricuspid valve: There was mild-moderate regurgitation. - Technically adequate study.  02/2015 Carotid US 1-39% bilateral ICA stenosis. Left subclavian stenosis in setting of LIMA graft.        Assessment and Plan   1. CAD/ICM/Chronic systolic HF - no current symptoms - we will increase entresto to 97/103mg  bid. Check labs in 1 week. Consider aldactone at next visit.   2. OSA screen -I asked her  to schedule f/u with Dr Juanetta Gosling. .   3. VT - followed by EP, no recent symptoms.   3. HTN - elevated in clinic, we will monitor with increased entresto dose.   4. Hyperlipidemia - continue statin.  5. Gait instability - place referral to PT     Dorothe Pea.  Harl Bowie, M.D., F.A.C.C.

## 2016-06-23 ENCOUNTER — Other Ambulatory Visit: Payer: Self-pay | Admitting: Cardiology

## 2016-06-25 ENCOUNTER — Telehealth: Payer: Self-pay | Admitting: Cardiology

## 2016-06-25 NOTE — Telephone Encounter (Signed)
LMOVM for pt to return call b/c she is overdue for a remote transmission.

## 2016-07-02 ENCOUNTER — Other Ambulatory Visit: Payer: Self-pay | Admitting: Cardiology

## 2016-07-14 ENCOUNTER — Other Ambulatory Visit: Payer: Self-pay | Admitting: Cardiology

## 2016-08-09 ENCOUNTER — Other Ambulatory Visit: Payer: Self-pay | Admitting: Cardiology

## 2016-08-10 NOTE — Telephone Encounter (Signed)
Rx request sent to pharmacy.  

## 2016-08-13 ENCOUNTER — Encounter: Payer: Self-pay | Admitting: Internal Medicine

## 2016-08-13 ENCOUNTER — Ambulatory Visit (INDEPENDENT_AMBULATORY_CARE_PROVIDER_SITE_OTHER): Payer: Medicare FFS | Admitting: Internal Medicine

## 2016-08-13 VITALS — BP 167/80 | HR 64 | Ht 65.0 in | Wt 165.0 lb

## 2016-08-13 DIAGNOSIS — I472 Ventricular tachycardia: Secondary | ICD-10-CM

## 2016-08-13 DIAGNOSIS — I255 Ischemic cardiomyopathy: Secondary | ICD-10-CM | POA: Diagnosis not present

## 2016-08-13 DIAGNOSIS — Z95810 Presence of automatic (implantable) cardiac defibrillator: Secondary | ICD-10-CM

## 2016-08-13 DIAGNOSIS — I5022 Chronic systolic (congestive) heart failure: Secondary | ICD-10-CM

## 2016-08-13 DIAGNOSIS — I4729 Other ventricular tachycardia: Secondary | ICD-10-CM

## 2016-08-13 DIAGNOSIS — I442 Atrioventricular block, complete: Secondary | ICD-10-CM | POA: Diagnosis not present

## 2016-08-13 DIAGNOSIS — E7800 Pure hypercholesterolemia, unspecified: Secondary | ICD-10-CM

## 2016-08-13 LAB — CUP PACEART INCLINIC DEVICE CHECK
Battery Remaining Longevity: 63 mo
Battery Voltage: 2.97 V
Brady Statistic AP VS Percent: 0.02 %
Brady Statistic AS VS Percent: 0.24 %
Brady Statistic RV Percent Paced: 99.65 %
HIGH POWER IMPEDANCE MEASURED VALUE: 37 Ohm
Implantable Lead Implant Date: 20160328
Implantable Lead Location: 753859
Implantable Lead Model: 4598
Lead Channel Impedance Value: 285 Ohm
Lead Channel Impedance Value: 304 Ohm
Lead Channel Impedance Value: 342 Ohm
Lead Channel Impedance Value: 361 Ohm
Lead Channel Impedance Value: 361 Ohm
Lead Channel Impedance Value: 475 Ohm
Lead Channel Impedance Value: 513 Ohm
Lead Channel Impedance Value: 551 Ohm
Lead Channel Pacing Threshold Amplitude: 1.75 V
Lead Channel Sensing Intrinsic Amplitude: 1 mV
Lead Channel Sensing Intrinsic Amplitude: 4.875 mV
Lead Channel Sensing Intrinsic Amplitude: 8.125 mV
Lead Channel Setting Pacing Amplitude: 2.5 V
Lead Channel Setting Pacing Amplitude: 2.5 V
Lead Channel Setting Pacing Pulse Width: 0.4 ms
Lead Channel Setting Pacing Pulse Width: 0.6 ms
MDC IDC LEAD IMPLANT DT: 20160328
MDC IDC LEAD IMPLANT DT: 20160328
MDC IDC LEAD LOCATION: 753858
MDC IDC LEAD LOCATION: 753860
MDC IDC MSMT LEADCHNL LV IMPEDANCE VALUE: 285 Ohm
MDC IDC MSMT LEADCHNL LV IMPEDANCE VALUE: 304 Ohm
MDC IDC MSMT LEADCHNL LV IMPEDANCE VALUE: 551 Ohm
MDC IDC MSMT LEADCHNL LV IMPEDANCE VALUE: 551 Ohm
MDC IDC MSMT LEADCHNL LV PACING THRESHOLD PULSEWIDTH: 0.6 ms
MDC IDC MSMT LEADCHNL RA IMPEDANCE VALUE: 304 Ohm
MDC IDC MSMT LEADCHNL RA PACING THRESHOLD AMPLITUDE: 0.625 V
MDC IDC MSMT LEADCHNL RA PACING THRESHOLD PULSEWIDTH: 0.4 ms
MDC IDC MSMT LEADCHNL RA SENSING INTR AMPL: 1 mV
MDC IDC MSMT LEADCHNL RV PACING THRESHOLD AMPLITUDE: 0.75 V
MDC IDC MSMT LEADCHNL RV PACING THRESHOLD PULSEWIDTH: 0.4 ms
MDC IDC PG IMPLANT DT: 20160328
MDC IDC SESS DTM: 20180105124135
MDC IDC SET LEADCHNL RA PACING AMPLITUDE: 2 V
MDC IDC SET LEADCHNL RV SENSING SENSITIVITY: 0.3 mV
MDC IDC STAT BRADY AP VP PERCENT: 1.29 %
MDC IDC STAT BRADY AS VP PERCENT: 98.45 %
MDC IDC STAT BRADY RA PERCENT PACED: 1.31 %

## 2016-08-13 NOTE — Progress Notes (Signed)
Electrophysiology Office Note   Date:  08/13/2016   ID:  Kelly PersonDella Colburn, DOB [birth year], MRN [id number]  PCP:  Kirstie PeriSHAH,ASHISH, MD  Cardiologist:  Dr Wyline MoodBranch Primary Electrophysiologist: Hillis RangeJames Magalene Mclear, MD    CC: VT   History of Present Illness: [name] is a 82 y.o. female who presents today for electrophysiology evaluation.   She underwent initial ICD implant for primary prevention in [place] by Dr Levonne SpillerHeglund June 2015.  She developed bradycardia dependant polymorphic VT and was admitted 10/[facility] to [facility].  She and underwent upgrade of her device to a BiV ICD.  She has done well since.  She denies prior ICD shocks and is otherwise doing well. She is not very active.  In a wheelchair today.  Today, she denies symptoms of palpitations, chest pain, shortness of breath, orthopnea, PND, lower extremity edema, claudication, dizziness, presyncope, syncope, bleeding, or neurologic sequela. The patient is tolerating medications without difficulties and is otherwise without complaint today.    Past Medical History:  Diagnosis Date  . Alzheimer's dementia   . Chronic systolic CHF (congestive heart failure) (HCC)    a. 04/2012 EF 25-30%, viable myocardium by rest thallium redistribution   . Coronary artery disease    a. 2009 CABG x 3(LIMA->LAD, VG->RAMUS, VG->OM;  b. 04/2012 Cath/PCI: LM nl, LAD 50-70p,6761m, [place] 15966m, LCX occluded OM's, RCA 4186m, 30-40d, PDA 70p, VG->RI nl, VG->OM nl, LIMA->LAD nl, EF 25-30%. RCA stented w/ 2.25x28 & 2.5x728mm Xpedition DES'.   . Hyperlipidemia   . Hypertension   . Ischemic cardiomyopathy   . Polymorphic ventricular tachycardia (HCC)   . S/P ICD (internal cardiac defibrillator) procedure, with upgrade to ICD/CRT-D  Medtronic 11/06/2014  . Type II diabetes mellitus (HCC)    Past Surgical History:  Procedure Laterality Date  . BI-VENTRICULAR IMPLANTABLE CARDIOVERTER DEFIBRILLATOR UPGRADE N/A 11/04/2014   Initial ICD implanted in [place] by Dr Levonne SpillerHeglund.  Upgraded to MDT  BiV ICD by Dr Graciela HusbandsKlein for bradycardia dependant PMVT  . CARDIAC CATHETERIZATION  04/07/2012  . CORONARY ARTERY BYPASS GRAFT  ~ 2009   CABG X1  . goiter removed    . LEFT AND RIGHT HEART CATHETERIZATION WITH CORONARY/GRAFT ANGIOGRAM N/A 04/07/2012   Procedure: LEFT AND RIGHT HEART CATHETERIZATION WITH Isabel CapriceORONARY/GRAFT ANGIOGRAM;  Surgeon: Vesta MixerPhilip J Nahser, MD;  [facility];  Service: Cardiovascular;  Laterality: N/A;  . PERCUTANEOUS CORONARY STENT INTERVENTION (PCI-S) N/A 04/11/2012   Procedure: PERCUTANEOUS CORONARY STENT INTERVENTION (PCI-S);  Surgeon: Herby Abrahamhomas D Stuckey, MD;  [facility];  Service: Cardiovascular;  Laterality: N/A;     Current Outpatient Prescriptions  Medication Sig Dispense Refill  . acetaminophen (TYLENOL) 325 MG tablet Take 1-2 tablets (325-650 mg total) by mouth every 4 (four) hours as needed for mild pain.    Marland Kitchen. aspirin EC 81 MG tablet Take 81 mg by mouth daily.    Marland Kitchen. atorvastatin (LIPITOR) 40 MG tablet Take 40 mg by mouth daily.    . carvedilol (COREG) 25 MG tablet Take 1 tablet (25 mg total) by mouth 2 (two) times daily. 180 tablet 3  . clopidogrel (PLAVIX) 75 MG tablet TAKE 1 TABLET (75 MG TOTAL) BY MOUTH DAILY. 90 tablet 1  . ferrous sulfate 325 (65 FE) MG tablet TAKE 1 TABLET BY MOUTH EVERY DAY (NEEDS APPOINTMENT FOR MORE REFILLS) 30 tablet 6  . furosemide (LASIX) 40 MG tablet Take 1 tablet (40 mg total) by mouth daily. 30 tablet 6  . hydrALAZINE (APRESOLINE) 50 MG tablet Take 1 tablet (50  mg total) by mouth every 8 (eight) hours. 90 tablet 11  . levothyroxine (SYNTHROID, LEVOTHROID) 25 MCG tablet TAKE 1 TABLET BY MOUTH DAILY BEFORE BREAKFAST. 30 tablet 6  . meclizine (ANTIVERT) 25 MG tablet Take 25 mg by mouth 3 (three) times daily as needed for dizziness.    . memantine (NAMENDA) 10 MG tablet Take 10 mg by mouth 2 (two) times daily.  5  . nitroGLYCERIN (NITROLINGUAL) 0.4 MG/SPRAY spray Place 1 spray under the tongue every 5 (five) minutes x 3 doses as  needed for chest pain.    . sacubitril-valsartan (ENTRESTO) 97-103 MG Take 1 tablet by mouth 2 (two) times daily. 60 tablet 3   No current facility-administered medications for this visit.     Allergies:   Patient has no known allergies.   Social History:  The patient  reports that she has never smoked. She has never used smokeless tobacco. She reports that she does not drink alcohol or use drugs.   Family History:   + HTN   ROS:  Please see the history of present illness.   All other systems are reviewed and negative.    PHYSICAL EXAM: VS:  BP (!) 167/80   Pulse 64   Ht 5\' 5"  (1.651 m)   Wt 165 lb (74.8 kg)   BMI 27.46 kg/m  , BMI Body mass index is 27.46 kg/m. GEN: elderly, in no acute distress  HEENT: normal  Neck: no JVD, carotid bruits, or masses Cardiac: RRR; no murmurs, rubs, or gallops,no edema  Respiratory:  clear to auscultation bilaterally, normal work of breathing GI: soft, nontender, nondistended, + BS MS: no deformity or atrophy  Skin: warm and dry, device pocket is well healed Neuro:  Strength and sensation are intact Psych: euthymic mood, full affect  EKG:  EKG is ordered today. The ekg ordered today shows sinus rhythm with BiV pacing, Qtc 506 msec msec  Device interrogation is reviewed today in detail.  See PaceArt for details.   Wt Readings from Last 3 Encounters:  08/13/16 165 lb (74.8 kg)  04/14/16 171 lb 6.4 oz (77.7 kg)  02/05/16 170 lb (77.1 kg)    Other studies Reviewed: Additional studies/ records that were reviewed today include: Dr Princess Perna notes   ASSESSMENT AND PLAN:  1.  Polymorphic VT Resolved with ICD upgrade Normal BiV ICD function See Pace Art report No changes today Bmet, TSH, Mg today  2. Ischemic CM euvolemic today No medicine changes  3. CAD No ischemic symptoms Check LFTs, lipids today  4. HTN Elevated today She has not taken her medicine yet No changes at this time  Follow-up: carelink Enroll in ICM device  clinic Return to see me in 1 year  Current medicines are reviewed at length with the patient today.   The patient does not have concerns regarding her medicines.  The following changes were made today:  none  Signed, Hillis Range, MD  08/13/2016 11:28 AM

## 2016-08-13 NOTE — Patient Instructions (Signed)
Medication Instructions:  Continue all current medications.  Labwork:  CMET, TSH, Magnesium, Lipids - orders given today, [facility].  Office will contact with results via phone or letter.    Testing/Procedures: none  Follow-Up: Your physician wants you to follow up in:  1 year.  You will receive a reminder letter in the mail one-two months in advance.  If you don't receive a letter, please call our office to schedule the follow up appointment   Any Other Special Instructions Will Be Listed Below (If Applicable). Remote monitoring is used to monitor your Pacemaker of ICD from home. This monitoring reduces the number of office visits required to check your device to one time per year. It allows us to keep an eye on the functioning of your device to ensure it is working properly. You are scheduled for a device check from home on 11/15/2016. You may send your transmission at any time that day. If you have a wireless device, the transmission will be sent automatically. After your physician reviews your transmission, you will receive a postcard with your next transmission date.  If you need a refill on your cardiac medications before your next appointment, please call your pharmacy.

## 2016-08-31 ENCOUNTER — Encounter: Payer: Self-pay | Admitting: *Deleted

## 2016-09-01 ENCOUNTER — Telehealth: Payer: Self-pay | Admitting: *Deleted

## 2016-09-01 DIAGNOSIS — I10 Essential (primary) hypertension: Secondary | ICD-10-CM

## 2016-09-01 DIAGNOSIS — E876 Hypokalemia: Secondary | ICD-10-CM

## 2016-09-01 DIAGNOSIS — R790 Abnormal level of blood mineral: Secondary | ICD-10-CM

## 2016-09-01 MED ORDER — MAGNESIUM 400 MG PO TABS: 1.0000 | ORAL_TABLET | Freq: Every day | ORAL | 6 refills | Status: DC

## 2016-09-01 MED ORDER — POTASSIUM CHLORIDE CRYS ER 20 MEQ PO TBCR: 20.0000 meq | EXTENDED_RELEASE_TABLET | Freq: Every day | ORAL | 6 refills | Status: DC

## 2016-09-01 NOTE — Telephone Encounter (Signed)
Notes Recorded by Lesle ChrisAngela G Hill, LPN on 9/14/78291/24/2018 at 9:32 AM EST Husband [name] notified & verbalized understanding. Will send new prescriptions to [facility] [address] Will also mail lab order so they can do just prior to next OV with Dr. Wyline MoodBranch in March. Will also fwd labs back to Dr. Wyline MoodBranch in regards to her lipids. ------  Notes Recorded by Lesle ChrisAngela G Hill, LPN on 5/62/13081/16/2018 at 10:48 AM EST Left message to return call.  ------  Notes Recorded by Hillis RangeJames Allred, MD on 08/21/2016 at 4:17 PM EST Results reviewed. Will forward lipids to Dr Wyline MoodBranch. Kelly HomesGayle, please start KDur 20meq daily and magnesium 400mg  daily. Will need repeat labs before seeing Dr Wyline MoodBranch.

## 2016-09-03 ENCOUNTER — Other Ambulatory Visit: Payer: Self-pay | Admitting: Cardiology

## 2016-09-07 ENCOUNTER — Telehealth: Payer: Self-pay

## 2016-09-07 NOTE — Telephone Encounter (Signed)
Referred to ICM clinic by Dr Johney FrameAllred.  Attempted ICM intro and left message to return call.

## 2016-09-10 ENCOUNTER — Telehealth: Payer: Self-pay | Admitting: *Deleted

## 2016-09-10 NOTE — Telephone Encounter (Signed)
Left message to return call 

## 2016-09-10 NOTE — Telephone Encounter (Signed)
-----   Message from Antoine PocheJonathan F Branch, MD sent at 09/01/2016  1:40 PM EST ----- Regarding: RE: Lipids  Please verify she has been taking atorvastatin 40mg  daily. Her cholestrol is too high, increase to atorvastatin 80mg  daily   Dominga FerryJ Branch MD ----- Message ----- From: Lesle ChrisAngela G Hill, LPN Sent: 1/61/09601/24/2018  10:37 AM To: Antoine PocheJonathan F Branch, MD Subject: Lipids                                         Please review Lipids.  Dr. Johney FrameAllred ordered labs, but looks like he was deferring management to you.  See lab results dated 08-13-2016.  Please advise.    Thank you,  Inocencio HomesGayle

## 2016-09-16 NOTE — Telephone Encounter (Signed)
Returned call to patient - husband notified.  Stated he did not see any Lipitor in her stack of pills.  He will check with pharmacy today & call back this afternoon.

## 2016-09-18 ENCOUNTER — Other Ambulatory Visit: Payer: Self-pay | Admitting: Cardiology

## 2016-09-20 MED ORDER — ATORVASTATIN CALCIUM 80 MG PO TABS: 80.0000 mg | ORAL_TABLET | Freq: Every day | ORAL | 6 refills | Status: DC

## 2016-09-20 NOTE — Telephone Encounter (Signed)
Follow up call placed to husband [name](Abbie).  She did have 40mg  on the Lipitor.  Will increase dose to 80mg  as instructed.  New rx sent to pharm today.

## 2016-09-28 NOTE — Telephone Encounter (Signed)
Attempted ICM intro call to patient and person answering phone stated she was unavailable.

## 2016-10-29 ENCOUNTER — Encounter: Payer: Self-pay | Admitting: *Deleted

## 2016-10-29 ENCOUNTER — Ambulatory Visit: Payer: Medicare FFS | Admitting: Cardiology

## 2016-10-29 NOTE — Progress Notes (Deleted)
Clinical Summary [name] is a 82 y.o.female seen today for follow up of the following medical problems.    **see Jan labs    1. CAD/ICM/Chronic systolic HF - hx of CABG in 2009 (LIMA-LAD, SVG-ramus, SVG-OM). Hx of RCA stent 04/2012 - she reports a more recent stent [place] in [place].She remains on plavix  02/2015 echo LVEF 20-25%, grade II diastolic dysfunction - she has CRT-D device followed by EP    - last visit we increased entresto to 97/103mg  bid.  - normal device check Jan 2018 with Dr Johney FrameAllred   ?aldactone  2. OSA screen - + snoring, occasional apneic episodes, daytime fatigue. - she has not scheduled her appt with sleep medicine yet.   3. VT - [facility] with SOB, found to be in VT that was converted twice by her ICD. K at that time was 2.8. She was started on amio gtt and transferred to [facility] - she also developed acute pulmonary edema requiring bipap and diuresis - ICD interrogation showed 9 VF events, 1 pace terminated and 3 shock terminated episodes, 6 aborted charges. 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 EP notes also mention complete heart block with brady dependent torsades.  - she underwent ICD upgrade of single chamber ICD to [facility] CRT-D  - denies any palpitations since our last visit - no episodes not on device check Jan 2018.   4. HTN - checks at home, typically 130s/60s - compliant with meds  5. Hyperlipidemia - compliant with statin.  - Jan 2018: TC 381 HDL 41 LDL 282  6. Hypomagnesemia/Hypokalemia - Mg 1.5 last check, K 3.3 in Jan 2018  Past Medical History:  Diagnosis Date  . Alzheimer's dementia   . Chronic systolic CHF (congestive heart failure) (HCC)    a. 04/2012 EF 25-30%, viable myocardium by rest thallium redistribution   . Coronary artery disease    a. 2009 CABG x 3(LIMA->LAD, VG->RAMUS, VG->OM;  b.  04/2012 Cath/PCI: LM nl, LAD 50-70p,4994m, [place] 14425m, LCX occluded OM's, RCA 583m, 30-40d, PDA 70p, VG->RI nl, VG->OM nl, LIMA->LAD nl, EF 25-30%. RCA stented w/ 2.25x28 & 2.5x588mm Xpedition DES'.   . Hyperlipidemia   . Hypertension   . Ischemic cardiomyopathy   . Polymorphic ventricular tachycardia (HCC)   . S/P ICD (internal cardiac defibrillator) procedure, with upgrade to ICD/CRT-D  Medtronic 11/06/2014  . Type II diabetes mellitus (HCC)      No Known Allergies   Current Outpatient Prescriptions  Medication Sig Dispense Refill  . acetaminophen (TYLENOL) 325 MG tablet Take 1-2 tablets (325-650 mg total) by mouth every 4 (four) hours as needed for mild pain.    Marland Kitchen. aspirin EC 81 MG tablet Take 81 mg by mouth daily.    Marland Kitchen. atorvastatin (LIPITOR) 80 MG tablet Take 1 tablet (80 mg total) by mouth daily. 30 tablet 6  . carvedilol (COREG) 25 MG tablet TAKE 1 TABLET (25 MG TOTAL) BY MOUTH 2 (TWO) TIMES DAILY. 180 tablet 1  . clopidogrel (PLAVIX) 75 MG tablet TAKE 1 TABLET (75 MG TOTAL) BY MOUTH DAILY. 90 tablet 1  . ferrous sulfate 325 (65 FE) MG tablet TAKE 1 TABLET BY MOUTH EVERY DAY (NEEDS APPOINTMENT FOR MORE REFILLS) 30 tablet 6  . furosemide (LASIX) 40 MG tablet Take 1 tablet (40 mg total) by mouth daily. 30 tablet 6  . hydrALAZINE (APRESOLINE) 50 MG tablet  Take 1 tablet (50 mg total) by mouth every 8 (eight) hours. 90 tablet 11  . levothyroxine (SYNTHROID, LEVOTHROID) 25 MCG tablet TAKE 1 TABLET BY MOUTH DAILY BEFORE BREAKFAST. 30 tablet 6  . Magnesium 400 MG TABS Take 1 tablet by mouth daily. 30 tablet 6  . meclizine (ANTIVERT) 25 MG tablet Take 25 mg by mouth 3 (three) times daily as needed for dizziness.    . memantine (NAMENDA) 10 MG tablet Take 10 mg by mouth 2 (two) times daily.  5  . nitroGLYCERIN (NITROLINGUAL) 0.4 MG/SPRAY spray Place 1 spray under the tongue every 5 (five) minutes x 3 doses as needed for chest pain.    . potassium chloride SA (K-DUR,KLOR-CON) 20 MEQ tablet Take 1 tablet  (20 mEq total) by mouth daily. 30 tablet 6  . sacubitril-valsartan (ENTRESTO) 97-103 MG Take 1 tablet by mouth 2 (two) times daily. 60 tablet 3   No current facility-administered medications for this visit.      Past Surgical History:  Procedure Laterality Date  . BI-VENTRICULAR IMPLANTABLE CARDIOVERTER DEFIBRILLATOR UPGRADE N/A 11/04/2014   Initial ICD implanted in [place] by Dr Levonne Spiller.  Upgraded to MDT BiV ICD by Dr Graciela Husbands for bradycardia dependant PMVT  . CARDIAC CATHETERIZATION  04/07/2012  . CORONARY ARTERY BYPASS GRAFT  ~ 2009   CABG X1  . goiter removed    . LEFT AND RIGHT HEART CATHETERIZATION WITH CORONARY/GRAFT ANGIOGRAM N/A 04/07/2012   Procedure: LEFT AND RIGHT HEART CATHETERIZATION WITH Isabel Caprice;  Surgeon: Vesta Mixer, MD;  Location: [facility] CATH LAB;  Service: Cardiovascular;  Laterality: N/A;  . PERCUTANEOUS CORONARY STENT INTERVENTION (PCI-S) N/A 04/11/2012   Procedure: PERCUTANEOUS CORONARY STENT INTERVENTION (PCI-S);  Surgeon: Herby Abraham, MD;  Location: [facility] CATH LAB;  Service: Cardiovascular;  Laterality: N/A;     No Known Allergies    No family history on file.   Social History [name] reports that she has never smoked. She has never used smokeless tobacco. [name] reports that she does not drink alcohol.   Review of Systems CONSTITUTIONAL: No weight loss, fever, chills, weakness or fatigue.  HEENT: Eyes: No visual loss, blurred vision, double vision or yellow sclerae.No hearing loss, sneezing, congestion, runny nose or sore throat.  SKIN: No rash or itching.  CARDIOVASCULAR:  RESPIRATORY: No shortness of breath, cough or sputum.  GASTROINTESTINAL: No anorexia, nausea, vomiting or diarrhea. No abdominal pain or blood.  GENITOURINARY: No burning on urination, no polyuria NEUROLOGICAL: No headache, dizziness, syncope, paralysis, ataxia, numbness or tingling in the extremities. No change in bowel or bladder control.  MUSCULOSKELETAL: No  muscle, back pain, joint pain or stiffness.  LYMPHATICS: No enlarged nodes. No history of splenectomy.  PSYCHIATRIC: No history of depression or anxiety.  ENDOCRINOLOGIC: No reports of sweating, cold or heat intolerance. No polyuria or polydipsia.  Marland Kitchen   Physical Examination There were no vitals filed for this visit. There were no vitals filed for this visit.  Gen: resting comfortably, no acute distress HEENT: no scleral icterus, pupils equal round and reactive, no palptable cervical adenopathy,  CV Resp: Clear to auscultation bilaterally GI: abdomen is soft, non-tender, non-distended, normal bowel sounds, no hepatosplenomegaly MSK: extremities are warm, no edema.  Skin: warm, no rash Neuro:  no focal deficits Psych: appropriate affect   Diagnostic Studies 04/2012 Cath Hemodynamics:   RA:4/4 RV:30/3 PCWP:9/4 PA:29/11  Cardiac Output  Thermodilution:3.39Index of 2.1 Fick :3.78Index 1.88  Arterial Sat:94% PA Sat:60%.  LV pressure:151/8 Aortic pressure:152/67  Angiography  Left Main:smooth and normal  Left anterior Descending: Moderate disease in the proximal segment between 50-70%. The LAD gives off a small diagonal and then has a lucent 70% stenosis. Competitive filling can be seen coming from the IMA graft.  Ramus Intermediate : The ramus intermediate is a moderate vessel and is occluded at it's mid point.   Left Circumflex:The LCx is a small - moderate sized vessel . The OM branches are occluded.  Right Coronary Artery: Large and dominant. The entire RCA is moderately diffusely diseased. There is a tortous 40% stenosis in the proximal vessel. The mid RCA has a tight 95% stenosis. There is brisk flow through this stenosis. The distal RCA has a 30-40% stenosis . The PDA has a 70% stenosis in the proximal aspect of the Aidynn Krenn. The PLSA has no significant  disease.  SVG to Ramus : The more superior graft is assumed to be to the Ramus Brittney Caraway. Nice graft. No stenosis in the graft or native vessel ( assumed to be Ramus)  SVG to Obtuse Marginal :( the inferior graft) Normal graft. Normal native vessel.  LIMA to LAD: Normal graft. The anastomosis is normal. There is competitive flow from the native LAD.  LV Gram:done with hand injection of 7 cc contrast. Severe LV dysfunction. EF 25-30%  Complications: No apparent complications Patient did tolerate procedure well.  Contrast used:92 cc   Conclusions:  1. Severe native CAD . She has patent grafts to the LAD, OM, and assumed ramus Chantee Cerino. The native RCA does not have any grafts and has a tight 95% stenosis in the mid segment.   The patient was not consented for PCI prior to arriving in the cath lab and she had already been given valium. We will treat with heparin overnight and plan PCI on Tuesday.   In addition, there was some concern about her renal function and limiting contrast. Staging the PCI will allow her to clear all of the contrast given and minimize risk of contrast induced nephropathy.  2. Severe LV dysfunction:She has an EF of 25-30 %. Her filling pressures are low / normal. I do not think she needs any further diuresis over what she is getting currently.     03/2012 Echo LVEF 20-25%, grade II diastolic dysfunction, mod MR,   02/2015 Echo Study Conclusions  - Left ventricle: The cavity size was normal. Wall thickness was increased in a pattern of moderate LVH. Systolic function was severely reduced. The estimated ejection fraction was in the range of 20% to 25%. Diffuse hypokinesis. Features are consistent with a pseudonormal left ventricular filling pattern, with concomitant abnormal relaxation and increased filling pressure (grade 2 diastolic dysfunction). - Aortic valve: Mildly calcified annulus. Trileaflet;  mildly thickened leaflets. Valve area (VTI): 1.73 cm^2. Valve area (Vmax): 1.73 cm^2. Valve area (Vmean): 1.92 cm^2. - Mitral valve: Mildly calcified annulus. Mildly thickened leaflets . There was moderate regurgitation. The MR vena contracta is 0.5 cm. - Left atrium: The atrium was moderately to severely dilated. - Right ventricle: The cavity size was mildly dilated. - Right atrium: The atrium was mildly dilated. - Tricuspid valve: There was mild-moderate regurgitation. - Technically adequate study.  02/2015 Carotid US 1-39% bilateral ICA stenosis. Left subclavian stenosis in setting of LIMA graft.         Assessment and Plan  1. CAD/ICM/Chronic systolic HF - no current symptoms - we will increase entresto to 97/103mg  bid. Check labs in 1 week. Consider aldactone at next visit.   2. OSA  screen -I asked her  to schedule f/u with Dr Juanetta Gosling. .   3. VT - followed by EP, no recent symptoms.   3. HTN - elevated in clinic, we will monitor with increased entresto dose.   4. Hyperlipidemia - continue statin.  5. Gait instability - place referral to PT        Antoine Poche, M.D., F.A.C.C.

## 2016-11-03 ENCOUNTER — Encounter: Payer: Self-pay | Admitting: Cardiology

## 2016-11-12 ENCOUNTER — Other Ambulatory Visit: Payer: Self-pay | Admitting: *Deleted

## 2016-11-12 MED ORDER — LEVOTHYROXINE SODIUM 25 MCG PO TABS: 90.0000 ug | ORAL_TABLET | Freq: Every day | ORAL | 3 refills | Status: DC

## 2016-11-12 MED ORDER — POTASSIUM CHLORIDE CRYS ER 20 MEQ PO TBCR: 20.0000 meq | EXTENDED_RELEASE_TABLET | Freq: Every day | ORAL | 0 refills | Status: DC

## 2016-11-12 MED ORDER — MAGNESIUM 400 MG PO TABS: 1.0000 | ORAL_TABLET | Freq: Every day | ORAL | 0 refills | Status: DC

## 2016-11-12 MED ORDER — FERROUS SULFATE 325 (65 FE) MG PO TABS: ORAL_TABLET | ORAL | 0 refills | Status: DC

## 2016-11-15 ENCOUNTER — Encounter: Payer: Medicare FFS | Admitting: *Deleted

## 2016-11-15 ENCOUNTER — Telehealth: Payer: Self-pay | Admitting: Cardiology

## 2016-11-15 NOTE — Telephone Encounter (Signed)
Spoke with pt and reminded pt of remote transmission that is due today. Pt verbalized understanding.   

## 2016-11-19 ENCOUNTER — Encounter: Payer: Self-pay | Admitting: Cardiology

## 2016-11-29 ENCOUNTER — Other Ambulatory Visit: Payer: Self-pay | Admitting: *Deleted

## 2016-11-29 MED ORDER — ATORVASTATIN CALCIUM 80 MG PO TABS: 80.0000 mg | ORAL_TABLET | Freq: Every day | ORAL | 0 refills | Status: DC

## 2016-12-06 ENCOUNTER — Other Ambulatory Visit: Payer: Self-pay

## 2016-12-24 ENCOUNTER — Other Ambulatory Visit: Payer: Self-pay | Admitting: Cardiology

## 2017-01-28 ENCOUNTER — Other Ambulatory Visit: Payer: Self-pay | Admitting: Internal Medicine

## 2017-01-28 NOTE — Telephone Encounter (Signed)
This is a [place] pt °

## 2017-02-14 ENCOUNTER — Other Ambulatory Visit: Payer: Self-pay | Admitting: Cardiology

## 2017-03-06 ENCOUNTER — Other Ambulatory Visit: Payer: Self-pay | Admitting: Cardiology

## 2017-03-21 ENCOUNTER — Other Ambulatory Visit: Payer: Self-pay | Admitting: Cardiology

## 2017-04-03 ENCOUNTER — Other Ambulatory Visit: Payer: Self-pay | Admitting: Cardiology

## 2017-04-07 ENCOUNTER — Other Ambulatory Visit: Payer: Self-pay | Admitting: Cardiology

## 2017-04-14 ENCOUNTER — Other Ambulatory Visit: Payer: Self-pay | Admitting: Cardiology

## 2017-04-18 ENCOUNTER — Other Ambulatory Visit: Payer: Self-pay | Admitting: Cardiology

## 2017-05-06 ENCOUNTER — Other Ambulatory Visit: Payer: Self-pay | Admitting: Internal Medicine

## 2017-05-12 ENCOUNTER — Other Ambulatory Visit: Payer: Self-pay | Admitting: Cardiology

## 2017-06-10 ENCOUNTER — Other Ambulatory Visit: Payer: Self-pay | Admitting: Cardiology

## 2017-07-20 ENCOUNTER — Other Ambulatory Visit: Payer: Self-pay | Admitting: Cardiology

## 2017-07-24 ENCOUNTER — Other Ambulatory Visit: Payer: Self-pay | Admitting: Cardiology

## 2017-08-08 ENCOUNTER — Other Ambulatory Visit: Payer: Self-pay | Admitting: Cardiology

## 2017-08-12 ENCOUNTER — Encounter: Payer: Medicare FFS | Admitting: Internal Medicine

## 2017-08-15 ENCOUNTER — Other Ambulatory Visit: Payer: Self-pay | Admitting: Cardiology

## 2017-08-15 ENCOUNTER — Other Ambulatory Visit: Payer: Self-pay

## 2017-08-16 ENCOUNTER — Other Ambulatory Visit: Payer: Self-pay | Admitting: *Deleted

## 2017-08-16 ENCOUNTER — Encounter: Payer: Self-pay | Admitting: Internal Medicine

## 2017-08-16 MED ORDER — CLOPIDOGREL BISULFATE 75 MG PO TABS: 75.0000 mg | ORAL_TABLET | Freq: Every day | ORAL | 0 refills | Status: DC

## 2017-08-19 ENCOUNTER — Other Ambulatory Visit: Payer: Self-pay | Admitting: Cardiology

## 2017-08-25 ENCOUNTER — Other Ambulatory Visit: Payer: Self-pay | Admitting: Cardiology

## 2017-08-25 ENCOUNTER — Other Ambulatory Visit: Payer: Self-pay | Admitting: *Deleted

## 2017-08-25 MED ORDER — MAGNESIUM OXIDE 400 MG PO TABS: 1.0000 | ORAL_TABLET | Freq: Every day | ORAL | 3 refills | Status: DC

## 2017-09-29 ENCOUNTER — Ambulatory Visit: Payer: Medicare FFS | Admitting: Cardiology

## 2017-09-29 NOTE — Progress Notes (Deleted)
Clinical Summary [name] is a 83 y.o.female seen today for follow up of the following medical problems.   1. CAD/ICM/Chronic systolic HF - hx of CABG in 2009 (LIMA-LAD, SVG-ramus, SVG-OM). Hx of RCA stent 04/2012 - she reports a more recent stent [place] in [place].She remains on plavix  02/2015 echo LVEF 20-25%, grade II diastolic dysfunction - she has CRT-D device followed by EP   - last visit changed lisinopril to entresto. Tolerating well without side effects. No recent SOB, DOE, or LE edema   2. OSA screen - + snoring, occasional apneic episodes, daytime fatigue. - she has not scheduled her appt with sleep medicine yet.   3. VT - [facility] with [facility], found to be in VT that was converted twice by her ICD. K at that time was 2.8. She was started on amio gtt and transferred to [facility] - she also developed acute pulmonary edema requiring bipap and diuresis - ICD interrogation showed 9 VF events, 1 pace terminated and 3 shock terminated episodes, 6 aborted charges. 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 EP notes also mention complete heart block with brady dependent torsades.  - she underwent ICD upgrade of single chamber ICD to [facility] CRT-D  - denies any palpitations since our last visit  4. HTN - checks at home, typically 130s/60s - compliant with meds  5. Hyperlipidemia - compliant with statin.    Past Medical History:  Diagnosis Date  . Alzheimer's dementia   . Chronic systolic CHF (congestive heart failure) (HCC)    a. 04/2012 EF 25-30%, viable myocardium by rest thallium redistribution   . Coronary artery disease    a. 2009 CABG x 3(LIMA->LAD, VG->RAMUS, VG->OM;  b. 04/2012 Cath/PCI: LM nl, LAD 50-70p,8433m, RI 13453m, LCX occluded OM's, RCA 68108m, 30-40d, PDA 70p, VG->RI nl, VG->OM nl, LIMA->LAD nl, EF 25-30%. RCA stented w/ 2.25x28 & 2.5x608mm  Xpedition DES'.   . Hyperlipidemia   . Hypertension   . Ischemic cardiomyopathy   . Polymorphic ventricular tachycardia (HCC)   . S/P ICD (internal cardiac defibrillator) procedure, with upgrade to ICD/CRT-D  Medtronic 11/06/2014  . Type II diabetes mellitus (HCC)      No Known Allergies   Current Outpatient Medications  Medication Sig Dispense Refill  . acetaminophen (TYLENOL) 325 MG tablet Take 1-2 tablets (325-650 mg total) by mouth every 4 (four) hours as needed for mild pain.    Marland Kitchen. aspirin EC 81 MG tablet Take 81 mg by mouth daily.    Marland Kitchen. atorvastatin (LIPITOR) 80 MG tablet Take 1 tablet (80 mg total) by mouth daily. 15 tablet 0  . carvedilol (COREG) 25 MG tablet TAKE 1 TABLET (25 MG TOTAL) BY MOUTH 2 (TWO) TIMES DAILY. 180 tablet 1  . clopidogrel (PLAVIX) 75 MG tablet Take 1 tablet (75 mg total) by mouth daily. 14 tablet 0  . ENTRESTO 49-51 MG TAKE 1 TABLET BY MOUTH 2 TIMES DAILY. 60 tablet 0  . ENTRESTO 97-103 MG TAKE 1 TAB BY MOUTH TWICE A DAY 30 tablet 0  . ENTRESTO 97-103 MG TAKE 1 TABLET BY MOUTH TWICE A DAY 60 tablet 1  . ferrous sulfate 325 (65 FE) MG tablet TAKE 1 TABLET BY MOUTH EVERY DAY *NEEDS APPT FOR MORE REFILLS* 90 tablet 0  . furosemide (LASIX) 40 MG tablet Take 1 tablet (40 mg total) by mouth daily. 30 tablet 6  .  hydrALAZINE (APRESOLINE) 50 MG tablet Take 1 tablet (50 mg total) by mouth every 8 (eight) hours. 90 tablet 11  . levothyroxine (SYNTHROID, LEVOTHROID) 25 MCG tablet Take 3.5 tablets (87.5 mcg total) by mouth daily before breakfast. 90 tablet 3  . levothyroxine (SYNTHROID, LEVOTHROID) 25 MCG tablet TAKE 1 TABLET BY MOUTH DAILY BEFORE BREAKFAST. 30 tablet 6  . magnesium oxide (MAG-OX) 400 MG tablet Take 1 tablet (400 mg total) by mouth daily. 90 tablet 3  . Magnesium Oxide 400 (240 Mg) MG TABS TAKE 1 TABLET BY MOUTH EVERY DAY 90 tablet 0  . meclizine (ANTIVERT) 25 MG tablet Take 25 mg by mouth 3 (three) times daily as needed for dizziness.    . memantine  (NAMENDA) 10 MG tablet Take 10 mg by mouth 2 (two) times daily.  5  . nitroGLYCERIN (NITROLINGUAL) 0.4 MG/SPRAY spray Place 1 spray under the tongue every 5 (five) minutes x 3 doses as needed for chest pain.    . potassium chloride SA (K-DUR,KLOR-CON) 20 MEQ tablet Take 1 tablet (20 mEq total) by mouth daily. 90 tablet 0   No current facility-administered medications for this visit.      Past Surgical History:  Procedure Laterality Date  . BI-VENTRICULAR IMPLANTABLE CARDIOVERTER DEFIBRILLATOR UPGRADE N/A 11/04/2014   Initial ICD implanted in [place] by Dr Levonne Spiller.  Upgraded to MDT BiV ICD by Dr Graciela Husbands for bradycardia dependant PMVT  . CARDIAC CATHETERIZATION  04/07/2012  . CORONARY ARTERY BYPASS GRAFT  ~ 2009   CABG X1  . goiter removed    . LEFT AND RIGHT HEART CATHETERIZATION WITH CORONARY/GRAFT ANGIOGRAM N/A 04/07/2012   Procedure: LEFT AND RIGHT HEART CATHETERIZATION WITH Isabel Caprice;  Surgeon: Vesta Mixer, MD;  Location: [facility] CATH LAB;  Service: Cardiovascular;  Laterality: N/A;  . PERCUTANEOUS CORONARY STENT INTERVENTION (PCI-S) N/A 04/11/2012   Procedure: PERCUTANEOUS CORONARY STENT INTERVENTION (PCI-S);  Surgeon: Herby Abraham, MD;  Location: [facility] CATH LAB;  Service: Cardiovascular;  Laterality: N/A;     No Known Allergies    No family history on file.   Social History Ms. Liptak reports that  has never smoked. she has never used smokeless tobacco. Ms. Lacomb reports that she does not drink alcohol.   Review of Systems CONSTITUTIONAL: No weight loss, fever, chills, weakness or fatigue.  HEENT: Eyes: No visual loss, blurred vision, double vision or yellow sclerae.No hearing loss, sneezing, congestion, runny nose or sore throat.  SKIN: No rash or itching.  CARDIOVASCULAR:  RESPIRATORY: No shortness of breath, cough or sputum.  GASTROINTESTINAL: No anorexia, nausea, vomiting or diarrhea. No abdominal pain or blood.  GENITOURINARY: No burning on urination, no  polyuria NEUROLOGICAL: No headache, dizziness, syncope, paralysis, ataxia, numbness or tingling in the extremities. No change in bowel or bladder control.  MUSCULOSKELETAL: No muscle, back pain, joint pain or stiffness.  LYMPHATICS: No enlarged nodes. No history of splenectomy.  PSYCHIATRIC: No history of depression or anxiety.  ENDOCRINOLOGIC: No reports of sweating, cold or heat intolerance. No polyuria or polydipsia.  Marland Kitchen   Physical Examination There were no vitals filed for this visit. There were no vitals filed for this visit.  Gen: resting comfortably, no acute distress HEENT: no scleral icterus, pupils equal round and reactive, no palptable cervical adenopathy,  CV Resp: Clear to auscultation bilaterally GI: abdomen is soft, non-tender, non-distended, normal bowel sounds, no hepatosplenomegaly MSK: extremities are warm, no edema.  Skin: warm, no rash Neuro:  no focal deficits Psych: appropriate affect  Diagnostic Studies 04/2012 Cath Hemodynamics:   RA:4/4 RV:30/3 PCWP:9/4 PA:29/11  Cardiac Output  Thermodilution:3.39Index of 2.1 Fick :3.78Index 1.88  Arterial Sat:94% PA Sat:60%.  LV pressure:151/8 Aortic pressure:152/67  Angiography   Left Main:smooth and normal  Left anterior Descending: Moderate disease in the proximal segment between 50-70%. The LAD gives off a small diagonal and then has a lucent 70% stenosis. Competitive filling can be seen coming from the IMA graft.  Ramus Intermediate : The ramus intermediate is a moderate vessel and is occluded at it's mid point.   Left Circumflex:The LCx is a small - moderate sized vessel . The OM branches are occluded.  Right Coronary Artery: Large and dominant. The entire RCA is moderately diffusely diseased. There is a tortous 40% stenosis in the proximal vessel. The mid RCA has a tight 95% stenosis. There is  brisk flow through this stenosis. The distal RCA has a 30-40% stenosis . The PDA has a 70% stenosis in the proximal aspect of the Zymiere Trostle. The PLSA has no significant disease.  SVG to Ramus : The more superior graft is assumed to be to the Ramus [place]. Nice graft. No stenosis in the graft or native vessel ( assumed to be Ramus)  SVG to Obtuse Marginal :( the inferior graft) Normal graft. Normal native vessel.  LIMA to LAD: Normal graft. The anastomosis is normal. There is competitive flow from the native LAD.  LV Gram:done with hand injection of 7 cc contrast. Severe LV dysfunction. EF 25-30%  Complications: No apparent complications Patient did tolerate procedure well.  Contrast used:92 cc  Conclusions:  1. Severe native CAD . She has patent grafts to the LAD, OM, and assumed ramus Sheela Mcculley. The native RCA does not have any grafts and has a tight 95% stenosis in the mid segment.   The patient was not consented for PCI prior to arriving in the cath lab and she had already been given valium. We will treat with heparin overnight and plan PCI on Tuesday.   In addition, there was some concern about her renal function and limiting contrast. Staging the PCI will allow her to clear all of the contrast given and minimize risk of contrast induced nephropathy.  2. Severe LV dysfunction:She has an EF of 25-30 %. Her filling pressures are low / normal. I do not think she needs any further diuresis over what she is getting currently.     03/2012 Echo LVEF 20-25%, grade II diastolic dysfunction, mod MR,   02/2015 Echo Study Conclusions  - Left ventricle: The cavity size was normal. Wall thickness was increased in a pattern of moderate LVH. Systolic function was severely reduced. The estimated ejection fraction was in the range of 20% to 25%. Diffuse hypokinesis. Features are consistent with a pseudonormal left ventricular filling pattern,  with concomitant abnormal relaxation and increased filling pressure (grade 2 diastolic dysfunction). - Aortic valve: Mildly calcified annulus. Trileaflet; mildly thickened leaflets. Valve area (VTI): 1.73 cm^2. Valve area (Vmax): 1.73 cm^2. Valve area (Vmean): 1.92 cm^2. - Mitral valve: Mildly calcified annulus. Mildly thickened leaflets . There was moderate regurgitation. The MR vena contracta is 0.5 cm. - Left atrium: The atrium was moderately to severely dilated. - Right ventricle: The cavity size was mildly dilated. - Right atrium: The atrium was mildly dilated. - Tricuspid valve: There was mild-moderate regurgitation. - Technically adequate study.  02/2015 Carotid US 1-39% bilateral ICA stenosis. Left subclavian stenosis in setting of LIMA graft.     Assessment and Plan  1. CAD/ICM/Chronic systolic HF - no current symptoms - we will increase entresto to 97/103mg  bid. Check labs in 1 week. Consider aldactone at next visit.   2. OSA screen -I asked her  to schedule f/u with Dr Juanetta Gosling. .   3. VT - followed by EP, no recent symptoms.   3. HTN - elevated in clinic, we will monitor with increased entresto dose.   4. Hyperlipidemia - continue statin.  5. Gait instability - place referral to PT      Antoine Poche, M.D., F.A.C.C.

## 2017-09-30 ENCOUNTER — Encounter: Payer: Self-pay | Admitting: Cardiology

## 2017-10-10 ENCOUNTER — Other Ambulatory Visit: Payer: Self-pay | Admitting: *Deleted

## 2017-10-10 MED ORDER — CLOPIDOGREL BISULFATE 75 MG PO TABS: 75.0000 mg | ORAL_TABLET | Freq: Every day | ORAL | 0 refills | Status: DC

## 2017-10-24 ENCOUNTER — Other Ambulatory Visit: Payer: Self-pay | Admitting: Cardiology

## 2017-10-28 ENCOUNTER — Encounter: Payer: Medicare FFS | Admitting: Internal Medicine

## 2017-10-28 DIAGNOSIS — R0989 Other specified symptoms and signs involving the circulatory and respiratory systems: Secondary | ICD-10-CM

## 2017-10-31 ENCOUNTER — Other Ambulatory Visit: Payer: Self-pay | Admitting: *Deleted

## 2017-10-31 ENCOUNTER — Encounter: Payer: Self-pay | Admitting: Internal Medicine

## 2017-10-31 MED ORDER — FERROUS SULFATE 325 (65 FE) MG PO TABS: ORAL_TABLET | ORAL | 0 refills | Status: DC

## 2017-11-09 ENCOUNTER — Other Ambulatory Visit: Payer: Self-pay | Admitting: Cardiology

## 2017-11-09 NOTE — Telephone Encounter (Signed)
REFILL 

## 2017-11-10 ENCOUNTER — Ambulatory Visit: Payer: Medicare FFS | Admitting: Cardiology

## 2017-11-10 ENCOUNTER — Other Ambulatory Visit: Payer: Self-pay | Admitting: Cardiology

## 2017-11-10 NOTE — Progress Notes (Deleted)
Clinical Summary [name] is a 83 y.o.female seen today for follow up of the following medical problems.   1. CAD/ICM/Chronic systolic HF - hx of CABG in 2009 (LIMA-LAD, SVG-ramus, SVG-OM). Hx of RCA stent 04/2012 - she reports a more recent stent placed in [place].She remains on plavix  02/2015 echo LVEF 20-25%, grade II diastolic dysfunction - she has CRT-D device followed by EP   - last visit changed lisinopril to entresto. Tolerating well without side effects. No recent SOB, DOE, or LE edema   2. OSA screen - + snoring, occasional apneic episodes, daytime fatigue. - she has not scheduled her appt with sleep medicine yet.   3. VT - presented to [facility] 10/2014 with SOB, found to be in VT that was converted twice by her ICD. K at that time was 2.8. She was started on amio gtt and transferred to [facility] - she also developed acute pulmonary edema requiring bipap and diuresis - ICD interrogation showed 9 VF events, 1 pace terminated and 3 shock terminated episodes, 6 aborted charges. 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 EP notes also mention complete heart block with brady dependent torsades.  - she underwent ICD upgrade of single chamber ICD to [employer] CRT-D  - denies any palpitations since our last visit  4. HTN - checks at home, typically 130s/60s - compliant with meds  5. Hyperlipidemia - compliant with statin.     Past Medical History:  Diagnosis Date  . Alzheimer's dementia   . Chronic systolic CHF (congestive heart failure) (HCC)    a. 04/2012 EF 25-30%, viable myocardium by rest thallium redistribution   . Coronary artery disease    a. 2009 CABG x 3(LIMA->LAD, VG->RAMUS, VG->OM;  b. 04/2012 Cath/PCI: LM nl, LAD 50-70p,5m, RI 134m, LCX occluded OM's, RCA 103m, 30-40d, PDA 70p, VG->RI nl, VG->OM nl, LIMA->LAD nl, EF 25-30%. RCA stented w/ 2.25x28 & 2.5x57mm  Xpedition DES'.   . Hyperlipidemia   . Hypertension   . Ischemic cardiomyopathy   . Polymorphic ventricular tachycardia (HCC)   . S/P ICD (internal cardiac defibrillator) procedure, with upgrade to ICD/CRT-D  [employer] 11/06/2014  . Type II diabetes mellitus (HCC)      No Known Allergies   Current Outpatient Medications  Medication Sig Dispense Refill  . acetaminophen (TYLENOL) 325 MG tablet Take 1-2 tablets (325-650 mg total) by mouth every 4 (four) hours as needed for mild pain.    Marland Kitchen aspirin EC 81 MG tablet Take 81 mg by mouth daily.    Marland Kitchen atorvastatin (LIPITOR) 80 MG tablet Take 1 tablet (80 mg total) by mouth daily. 15 tablet 0  . carvedilol (COREG) 25 MG tablet TAKE 1 TABLET (25 MG TOTAL) BY MOUTH 2 (TWO) TIMES DAILY. 180 tablet 1  . clopidogrel (PLAVIX) 75 MG tablet Take 1 tablet (75 mg total) by mouth daily. 30 tablet 0  . ENTRESTO 49-51 MG TAKE 1 TABLET BY MOUTH 2 TIMES DAILY. 60 tablet 0  . ENTRESTO 97-103 MG TAKE 1 TAB BY MOUTH TWICE A DAY 30 tablet 0  . ENTRESTO 97-103 MG TAKE 1 TABLET BY MOUTH TWICE A DAY 60 tablet 0  . ferrous sulfate 325 (65 FE) MG tablet TAKE 1 TABLET BY MOUTH EVERY DAY *NEEDS APPT FOR MORE REFILLS* 30 tablet 0  . furosemide (LASIX) 40 MG tablet Take 1 tablet (40 mg total) by mouth daily. 30 tablet  6  . hydrALAZINE (APRESOLINE) 50 MG tablet Take 1 tablet (50 mg total) by mouth every 8 (eight) hours. 90 tablet 11  . levothyroxine (SYNTHROID, LEVOTHROID) 25 MCG tablet Take 3.5 tablets (87.5 mcg total) by mouth daily before breakfast. 90 tablet 3  . levothyroxine (SYNTHROID, LEVOTHROID) 25 MCG tablet TAKE 1 TABLET BY MOUTH DAILY BEFORE BREAKFAST. 30 tablet 6  . magnesium oxide (MAG-OX) 400 MG tablet Take 1 tablet (400 mg total) by mouth daily. 90 tablet 3  . Magnesium Oxide 400 (240 Mg) MG TABS TAKE 1 TABLET BY MOUTH EVERY DAY 90 tablet 0  . meclizine (ANTIVERT) 25 MG tablet Take 25 mg by mouth 3 (three) times daily as needed for dizziness.    . memantine  (NAMENDA) 10 MG tablet Take 10 mg by mouth 2 (two) times daily.  5  . nitroGLYCERIN (NITROLINGUAL) 0.4 MG/SPRAY spray Place 1 spray under the tongue every 5 (five) minutes x 3 doses as needed for chest pain.    . potassium chloride SA (K-DUR,KLOR-CON) 20 MEQ tablet Take 1 tablet (20 mEq total) by mouth daily. 90 tablet 0   No current facility-administered medications for this visit.      Past Surgical History:  Procedure Laterality Date  . BI-VENTRICULAR IMPLANTABLE CARDIOVERTER DEFIBRILLATOR UPGRADE N/A 11/04/2014   Initial ICD implanted in [place] by Dr Levonne Spiller.  Upgraded to MDT BiV ICD by Dr Graciela Husbands for bradycardia dependant PMVT  . CARDIAC CATHETERIZATION  04/07/2012  . CORONARY ARTERY BYPASS GRAFT  ~ 2009   CABG X1  . goiter removed    . LEFT AND RIGHT HEART CATHETERIZATION WITH CORONARY/GRAFT ANGIOGRAM N/A 04/07/2012   Procedure: LEFT AND RIGHT HEART CATHETERIZATION WITH Isabel Caprice;  Surgeon: Vesta Mixer, MD;  Location: [facility] CATH LAB;  Service: Cardiovascular;  Laterality: N/A;  . PERCUTANEOUS CORONARY STENT INTERVENTION (PCI-S) N/A 04/11/2012   Procedure: PERCUTANEOUS CORONARY STENT INTERVENTION (PCI-S);  Surgeon: Herby Abraham, MD;  Location: [facility] CATH LAB;  Service: Cardiovascular;  Laterality: N/A;     No Known Allergies    No family history on file.   Social History [name] reports that she has never smoked. She has never used smokeless tobacco. [name] reports that she does not drink alcohol.   Review of Systems CONSTITUTIONAL: No weight loss, fever, chills, weakness or fatigue.  HEENT: Eyes: No visual loss, blurred vision, double vision or yellow sclerae.No hearing loss, sneezing, congestion, runny nose or sore throat.  SKIN: No rash or itching.  CARDIOVASCULAR:  RESPIRATORY: No shortness of breath, cough or sputum.  GASTROINTESTINAL: No anorexia, nausea, vomiting or diarrhea. No abdominal pain or blood.  GENITOURINARY: No burning on urination,  no polyuria NEUROLOGICAL: No headache, dizziness, syncope, paralysis, ataxia, numbness or tingling in the extremities. No change in bowel or bladder control.  MUSCULOSKELETAL: No muscle, back pain, joint pain or stiffness.  LYMPHATICS: No enlarged nodes. No history of splenectomy.  PSYCHIATRIC: No history of depression or anxiety.  ENDOCRINOLOGIC: No reports of sweating, cold or heat intolerance. No polyuria or polydipsia.  Marland Kitchen   Physical Examination There were no vitals filed for this visit. There were no vitals filed for this visit.  Gen: resting comfortably, no acute distress HEENT: no scleral icterus, pupils equal round and reactive, no palptable cervical adenopathy,  CV Resp: Clear to auscultation bilaterally GI: abdomen is soft, non-tender, non-distended, normal bowel sounds, no hepatosplenomegaly MSK: extremities are warm, no edema.  Skin: warm, no rash Neuro:  no focal deficits Psych: appropriate  affect   Diagnostic Studies  04/2012 Cath Hemodynamics:   RA:4/4 RV:30/3 PCWP:9/4 PA:29/11  Cardiac Output  Thermodilution:3.39Index of 2.1 Fick :3.78Index 1.88  Arterial Sat:94% PA Sat:60%.  LV pressure:151/8 Aortic pressure:152/67  Angiography   Left Main:smooth and normal  Left anterior Descending: Moderate disease in the proximal segment between 50-70%. The LAD gives off a small diagonal and then has a lucent 70% stenosis. Competitive filling can be seen coming from the IMA graft.  Ramus Intermediate : The ramus intermediate is a moderate vessel and is occluded at it's mid point.   Left Circumflex:The LCx is a small - moderate sized vessel . The OM branches are occluded.  Right Coronary Artery: Large and dominant. The entire RCA is moderately diffusely diseased. There is a tortous 40% stenosis in the proximal vessel. The mid RCA has a tight 95% stenosis.  There is brisk flow through this stenosis. The distal RCA has a 30-40% stenosis . The PDA has a 70% stenosis in the proximal aspect of the [name]. The PLSA has no significant disease.  SVG to Ramus : The more superior graft is assumed to be to the Ramus [name]. Nice graft. No stenosis in the graft or native vessel ( assumed to be Ramus)  SVG to Obtuse Marginal :( the inferior graft) Normal graft. Normal native vessel.  LIMA to LAD: Normal graft. The anastomosis is normal. There is competitive flow from the native LAD.  LV Gram:done with hand injection of 7 cc contrast. Severe LV dysfunction. EF 25-30%  Complications: No apparent complications Patient did tolerate procedure well.  Contrast used:92 cc   Conclusions:  1. Severe native CAD . She has patent grafts to the LAD, OM, and assumed ramus [name]. The native RCA does not have any grafts and has a tight 95% stenosis in the mid segment.   The patient was not consented for PCI prior to arriving in the cath lab and she had already been given valium. We will treat with heparin overnight and plan PCI on Tuesday.   In addition, there was some concern about her renal function and limiting contrast. Staging the PCI will allow her to clear all of the contrast given and minimize risk of contrast induced nephropathy.  2. Severe LV dysfunction:She has an EF of 25-30 %. Her filling pressures are low / normal. I do not think she needs any further diuresis over what she is getting currently.     03/2012 Echo LVEF 20-25%, grade II diastolic dysfunction, mod MR,   02/2015 Echo Study Conclusions  - Left ventricle: The cavity size was normal. Wall thickness was increased in a pattern of moderate LVH. Systolic function was severely reduced. The estimated ejection fraction was in the range of 20% to 25%. Diffuse hypokinesis. Features are consistent with a pseudonormal left ventricular filling  pattern, with concomitant abnormal relaxation and increased filling pressure (grade 2 diastolic dysfunction). - Aortic valve: Mildly calcified annulus. Trileaflet; mildly thickened leaflets. Valve area (VTI): 1.73 cm^2. Valve area (Vmax): 1.73 cm^2. Valve area (Vmean): 1.92 cm^2. - Mitral valve: Mildly calcified annulus. Mildly thickened leaflets . There was moderate regurgitation. The MR vena contracta is 0.5 cm. - Left atrium: The atrium was moderately to severely dilated. - Right ventricle: The cavity size was mildly dilated. - Right atrium: The atrium was mildly dilated. - Tricuspid valve: There was mild-moderate regurgitation. - Technically adequate study.  02/2015 Carotid US 1-39% bilateral ICA stenosis. Left subclavian stenosis in setting of LIMA graft.  Assessment and Plan  1. CAD/ICM/Chronic systolic HF - no current symptoms - we will increase entresto to 97/103mg  bid. Check labs in 1 week. Consider aldactone at next visit.   2. OSA screen -I asked her  to schedule f/u with Dr Juanetta Gosling. .   3. VT - followed by EP, no recent symptoms.   3. HTN - elevated in clinic, we will monitor with increased entresto dose.   4. Hyperlipidemia - continue statin.  5. Gait instability - place referral to PT       Antoine Poche, M.D., F.A.C.C.

## 2017-11-24 ENCOUNTER — Other Ambulatory Visit: Payer: Self-pay | Admitting: *Deleted

## 2017-11-24 MED ORDER — CLOPIDOGREL BISULFATE 75 MG PO TABS: ORAL_TABLET | ORAL | 0 refills | Status: DC

## 2017-12-05 ENCOUNTER — Other Ambulatory Visit: Payer: Self-pay | Admitting: *Deleted

## 2017-12-05 MED ORDER — CLOPIDOGREL BISULFATE 75 MG PO TABS: ORAL_TABLET | ORAL | 0 refills | Status: DC

## 2017-12-07 ENCOUNTER — Ambulatory Visit: Payer: Medicare FFS | Admitting: Cardiology

## 2017-12-07 NOTE — Progress Notes (Deleted)
Clinical Summary Ms. Woolen is a 83 y.o.female seen today for follow up of the following medical problems.   1. CAD/ICM/Chronic systolic HF - hx of CABG in 2009 (LIMA-LAD, SVG-ramus, SVG-OM). Hx of RCA stent 04/2012 - she reports a more recent stent placed in [place].She remains on plavix  02/2015 echo LVEF 20-25%, grade II diastolic dysfunction - she has CRT-D device followed by EP   - last visit changed lisinopril to entresto. Tolerating well without side effects. No recent SOB, DOE, or LE edema   2. OSA screen - + snoring, occasional apneic episodes, daytime fatigue. - she has not scheduled her appt with sleep medicine yet.   3. VT - presented to [facility] 10/2014 with SOB, found to be in VT that was converted twice by her ICD. K at that time was 2.8. She was started on amio gtt and transferred to [facility] - she also developed acute pulmonary edema requiring bipap and diuresis - ICD interrogation showed 9 VF events, 1 pace terminated and 3 shock terminated episodes, 6 aborted charges. 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 EP notes also mention complete heart block with brady dependent torsades.  - she underwent ICD upgrade of single chamber ICD to [facility] CRT-D  - denies any palpitations since our last visit  4. HTN - checks at home, typically 130s/60s - compliant with meds  5. Hyperlipidemia - compliant with statin.     Past Medical History:  Diagnosis Date  . Alzheimer's dementia   . Chronic systolic CHF (congestive heart failure) (HCC)    a. 04/2012 EF 25-30%, viable myocardium by rest thallium redistribution   . Coronary artery disease    a. 2009 CABG x 3(LIMA->LAD, VG->RAMUS, VG->OM;  b. 04/2012 Cath/PCI: LM nl, LAD 50-70p,17m, RI 122m, LCX occluded OM's, RCA 42m, 30-40d, PDA 70p, VG->RI nl, VG->OM nl, LIMA->LAD nl, EF 25-30%. RCA stented w/ 2.25x28 & 2.5x19mm  Xpedition DES'.   . Hyperlipidemia   . Hypertension   . Ischemic cardiomyopathy   . Polymorphic ventricular tachycardia (HCC)   . S/P ICD (internal cardiac defibrillator) procedure, with upgrade to ICD/CRT-D  Medtronic 11/06/2014  . Type II diabetes mellitus (HCC)      No Known Allergies   Current Outpatient Medications  Medication Sig Dispense Refill  . acetaminophen (TYLENOL) 325 MG tablet Take 1-2 tablets (325-650 mg total) by mouth every 4 (four) hours as needed for mild pain.    Marland Kitchen aspirin EC 81 MG tablet Take 81 mg by mouth daily.    Marland Kitchen atorvastatin (LIPITOR) 80 MG tablet Take 1 tablet (80 mg total) by mouth daily. 15 tablet 0  . carvedilol (COREG) 25 MG tablet TAKE 1 TABLET (25 MG TOTAL) BY MOUTH 2 (TWO) TIMES DAILY. 180 tablet 1  . clopidogrel (PLAVIX) 75 MG tablet TAKE 1 TABLET BY MOUTH EVERY DAY*NEEDS OFFICE VISIT* 20 tablet 0  . ENTRESTO 49-51 MG TAKE 1 TABLET BY MOUTH 2 TIMES DAILY. 60 tablet 0  . ENTRESTO 97-103 MG TAKE 1 TAB BY MOUTH TWICE A DAY 30 tablet 0  . ENTRESTO 97-103 MG TAKE 1 TABLET BY MOUTH TWICE A DAY 60 tablet 0  . ferrous sulfate 325 (65 FE) MG tablet TAKE 1 TABLET BY MOUTH EVERY DAY *NEEDS APPT FOR MORE REFILLS* 30 tablet 0  . furosemide (LASIX) 40 MG tablet Take 1 tablet (40 mg total) by mouth daily. 30 tablet  6  . hydrALAZINE (APRESOLINE) 50 MG tablet Take 1 tablet (50 mg total) by mouth every 8 (eight) hours. 90 tablet 11  . levothyroxine (SYNTHROID, LEVOTHROID) 25 MCG tablet Take 3.5 tablets (87.5 mcg total) by mouth daily before breakfast. 90 tablet 3  . levothyroxine (SYNTHROID, LEVOTHROID) 25 MCG tablet TAKE 1 TABLET BY MOUTH DAILY BEFORE BREAKFAST. 30 tablet 6  . magnesium oxide (MAG-OX) 400 MG tablet Take 1 tablet (400 mg total) by mouth daily. 90 tablet 3  . Magnesium Oxide 400 (240 Mg) MG TABS TAKE 1 TABLET BY MOUTH EVERY DAY 90 tablet 0  . meclizine (ANTIVERT) 25 MG tablet Take 25 mg by mouth 3 (three) times daily as needed for dizziness.    .  memantine (NAMENDA) 10 MG tablet Take 10 mg by mouth 2 (two) times daily.  5  . nitroGLYCERIN (NITROLINGUAL) 0.4 MG/SPRAY spray Place 1 spray under the tongue every 5 (five) minutes x 3 doses as needed for chest pain.    . potassium chloride SA (K-DUR,KLOR-CON) 20 MEQ tablet Take 1 tablet (20 mEq total) by mouth daily. 90 tablet 0   No current facility-administered medications for this visit.      Past Surgical History:  Procedure Laterality Date  . BI-VENTRICULAR IMPLANTABLE CARDIOVERTER DEFIBRILLATOR UPGRADE N/A 11/04/2014   Initial ICD implanted in [employer] by Dr Levonne Spiller.  Upgraded to MDT BiV ICD by Dr Graciela Husbands for bradycardia dependant PMVT  . CARDIAC CATHETERIZATION  04/07/2012  . CORONARY ARTERY BYPASS GRAFT  ~ 2009   CABG X1  . goiter removed    . LEFT AND RIGHT HEART CATHETERIZATION WITH CORONARY/GRAFT ANGIOGRAM N/A 04/07/2012   Procedure: LEFT AND RIGHT HEART CATHETERIZATION WITH Isabel Caprice;  Surgeon: Vesta Mixer, MD;  Location: [facility] CATH LAB;  Service: Cardiovascular;  Laterality: N/A;  . PERCUTANEOUS CORONARY STENT INTERVENTION (PCI-S) N/A 04/11/2012   Procedure: PERCUTANEOUS CORONARY STENT INTERVENTION (PCI-S);  Surgeon: Herby Abraham, MD;  Location: [facility] CATH LAB;  Service: Cardiovascular;  Laterality: N/A;     No Known Allergies    No family history on file.   Social History Ms. Blandino reports that she has never smoked. She has never used smokeless tobacco. Ms. Forde reports that she does not drink alcohol.   Review of Systems CONSTITUTIONAL: No weight loss, fever, chills, weakness or fatigue.  HEENT: Eyes: No visual loss, blurred vision, double vision or yellow sclerae.No hearing loss, sneezing, congestion, runny nose or sore throat.  SKIN: No rash or itching.  CARDIOVASCULAR:  RESPIRATORY: No shortness of breath, cough or sputum.  GASTROINTESTINAL: No anorexia, nausea, vomiting or diarrhea. No abdominal pain or blood.  GENITOURINARY: No burning on  urination, no polyuria NEUROLOGICAL: No headache, dizziness, syncope, paralysis, ataxia, numbness or tingling in the extremities. No change in bowel or bladder control.  MUSCULOSKELETAL: No muscle, back pain, joint pain or stiffness.  LYMPHATICS: No enlarged nodes. No history of splenectomy.  PSYCHIATRIC: No history of depression or anxiety.  ENDOCRINOLOGIC: No reports of sweating, cold or heat intolerance. No polyuria or polydipsia.  Marland Kitchen   Physical Examination There were no vitals filed for this visit. There were no vitals filed for this visit.  Gen: resting comfortably, no acute distress HEENT: no scleral icterus, pupils equal round and reactive, no palptable cervical adenopathy,  CV Resp: Clear to auscultation bilaterally GI: abdomen is soft, non-tender, non-distended, normal bowel sounds, no hepatosplenomegaly MSK: extremities are warm, no edema.  Skin: warm, no rash Neuro:  no focal deficits Psych: appropriate  affect   Diagnostic Studies  04/2012 Cath Hemodynamics:   RA:4/4 RV:30/3 PCWP:9/4 PA:29/11  Cardiac Output  Thermodilution:3.39Index of 2.1 Fick :3.78Index 1.88  Arterial Sat:94% PA Sat:60%.  LV pressure:151/8 Aortic pressure:152/67  Angiography   Left Main:smooth and normal  Left anterior Descending: Moderate disease in the proximal segment between 50-70%. The LAD gives off a small diagonal and then has a lucent 70% stenosis. Competitive filling can be seen coming from the IMA graft.  Ramus Intermediate : The ramus intermediate is a moderate vessel and is occluded at it's mid point.   Left Circumflex:The LCx is a small - moderate sized vessel . The OM branches are occluded.  Right Coronary Artery: Large and dominant. The entire RCA is moderately diffusely diseased. There is a tortous 40% stenosis in the proximal vessel. The mid RCA has a tight 95%  stenosis. There is brisk flow through this stenosis. The distal RCA has a 30-40% stenosis . The PDA has a 70% stenosis in the proximal aspect of the Latia Mataya. The PLSA has no significant disease.  SVG to Ramus : The more superior graft is assumed to be to the Ramus Cadi Rhinehart. Nice graft. No stenosis in the graft or native vessel ( assumed to be Ramus)  SVG to Obtuse Marginal :( the inferior graft) Normal graft. Normal native vessel.  LIMA to LAD: Normal graft. The anastomosis is normal. There is competitive flow from the native LAD.  LV Gram:done with hand injection of 7 cc contrast. Severe LV dysfunction. EF 25-30%  Complications: No apparent complications Patient did tolerate procedure well.  Contrast used:92 cc   Conclusions:  1. Severe native CAD . She has patent grafts to the LAD, OM, and assumed ramus Truman Aceituno. The native RCA does not have any grafts and has a tight 95% stenosis in the mid segment.   The patient was not consented for PCI prior to arriving in the cath lab and she had already been given valium. We will treat with heparin overnight and plan PCI on Tuesday.   In addition, there was some concern about her renal function and limiting contrast. Staging the PCI will allow her to clear all of the contrast given and minimize risk of contrast induced nephropathy.  2. Severe LV dysfunction:She has an EF of 25-30 %. Her filling pressures are low / normal. I do not think she needs any further diuresis over what she is getting currently.     03/2012 Echo LVEF 20-25%, grade II diastolic dysfunction, mod MR,   02/2015 Echo Study Conclusions  - Left ventricle: The cavity size was normal. Wall thickness was increased in a pattern of moderate LVH. Systolic function was severely reduced. The estimated ejection fraction was in the range of 20% to 25%. Diffuse hypokinesis. Features are consistent with a pseudonormal left ventricular  filling pattern, with concomitant abnormal relaxation and increased filling pressure (grade 2 diastolic dysfunction). - Aortic valve: Mildly calcified annulus. Trileaflet; mildly thickened leaflets. Valve area (VTI): 1.73 cm^2. Valve area (Vmax): 1.73 cm^2. Valve area (Vmean): 1.92 cm^2. - Mitral valve: Mildly calcified annulus. Mildly thickened leaflets . There was moderate regurgitation. The MR vena contracta is 0.5 cm. - Left atrium: The atrium was moderately to severely dilated. - Right ventricle: The cavity size was mildly dilated. - Right atrium: The atrium was mildly dilated. - Tricuspid valve: There was mild-moderate regurgitation. - Technically adequate study.  02/2015 Carotid US 1-39% bilateral ICA stenosis. Left subclavian stenosis in setting of LIMA graft.  Assessment and Plan   1. CAD/ICM/Chronic systolic HF - no current symptoms - we will increase entresto to 97/103mg  bid. Check labs in 1 week. Consider aldactone at next visit.   2. OSA screen -I asked her  to schedule f/u with Dr Juanetta Gosling. .   3. VT - followed by EP, no recent symptoms.   3. HTN - elevated in clinic, we will monitor with increased entresto dose.   4. Hyperlipidemia - continue statin.  5. Gait instability - place referral to PT      Antoine Poche, M.D., F.A.C.C.

## 2017-12-08 ENCOUNTER — Encounter: Payer: Self-pay | Admitting: Cardiology

## 2018-01-14 ENCOUNTER — Other Ambulatory Visit: Payer: Self-pay | Admitting: Cardiovascular Disease

## 2018-01-15 ENCOUNTER — Other Ambulatory Visit: Payer: Self-pay | Admitting: Cardiology

## 2018-01-17 ENCOUNTER — Ambulatory Visit (INDEPENDENT_AMBULATORY_CARE_PROVIDER_SITE_OTHER): Payer: Medicare FFS | Admitting: Cardiology

## 2018-01-17 ENCOUNTER — Encounter: Payer: Self-pay | Admitting: Cardiology

## 2018-01-17 VITALS — BP 160/67 | HR 58 | Ht 65.0 in

## 2018-01-17 DIAGNOSIS — I255 Ischemic cardiomyopathy: Secondary | ICD-10-CM | POA: Diagnosis not present

## 2018-01-17 DIAGNOSIS — I10 Essential (primary) hypertension: Secondary | ICD-10-CM | POA: Diagnosis not present

## 2018-01-17 DIAGNOSIS — I2510 Atherosclerotic heart disease of native coronary artery without angina pectoris: Secondary | ICD-10-CM

## 2018-01-17 MED ORDER — FUROSEMIDE 40 MG PO TABS: 60.0000 mg | ORAL_TABLET | Freq: Every day | ORAL | 1 refills | Status: AC

## 2018-01-17 MED ORDER — SPIRONOLACTONE 25 MG PO TABS: 12.5000 mg | ORAL_TABLET | Freq: Every day | ORAL | 1 refills | Status: DC

## 2018-01-17 NOTE — Patient Instructions (Signed)
Your physician recommends that you schedule a follow-up appointment in: [facility]  Your physician has recommended you make the following change in your medication:   INCREASE LASIX 60 MG (1 AND 1/2 TABLETS) DAILY  START SPIRONOLACTONE 12.5 MG (1/2 TABLET) DAILY  Your physician recommends that you return for lab work in: 2 WEEKS - PLEASE FAST 6-8 HOURS PRIOR TO LAB WORK - CBC/BMP/MG/LIPIDS/HGBA1C/TSH   Thank you for choosing [facility]!!

## 2018-01-17 NOTE — Progress Notes (Signed)
Clinical Summary [name] is a 83 y.o.female seen today for follow up of the following medical problems.   1. CAD/ICM/Chronic systolic HF - hx of CABG in 2009 (LIMA-LAD, SVG-ramus, SVG-OM). Hx of RCA stent 04/2012 - she reports a later recent stent placed in [place].She remains on plavix  02/2015 echo LVEF 20-25%, grade II diastolic dysfunction - she has CRT-D device followed by EP   - no recent SOB/DOE. Occasional LE edema - compliant with meds  2. OSA screen - + snoring, occasional apneic episodes, daytime fatigue. - she has not scheduled her appt with sleep medicine yet.   3. VT - presented to [facility] 10/2014 with SOB, found to be in VT that was converted twice by her ICD. K at that time was 2.8. She was started on amio gtt and transferred to [facility] - she also developed acute pulmonary edema requiring bipap and diuresis - ICD interrogation showed 9 VF events, 1 pace terminated and 3 shock terminated episodes, 6 aborted charges. 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 EP notes also mention complete heart block with brady dependent torsades.  - she underwent ICD upgrade of single chamber ICD to [facility] CRT-D  - she denies any recent palpiations, no syncope.    4. HTN - compliant with meds.   5. Hyperlipidemia - 09/2016 we increased atorva to 80mg  daily.  - compliant with statin  Past Medical History:  Diagnosis Date  . Alzheimer's dementia   . Chronic systolic CHF (congestive heart failure) (HCC)    a. 04/2012 EF 25-30%, viable myocardium by rest thallium redistribution   . Coronary artery disease    a. 2009 CABG x 3(LIMA->LAD, VG->RAMUS, VG->OM;  b. 04/2012 Cath/PCI: LM nl, LAD 50-70p,51m, RI 142m, LCX occluded OM's, RCA 69m, 30-40d, PDA 70p, VG->RI nl, VG->OM nl, LIMA->LAD nl, EF 25-30%. RCA stented w/ 2.25x28 & 2.5x30mm Xpedition DES'.   . Hyperlipidemia   .  Hypertension   . Ischemic cardiomyopathy   . Polymorphic ventricular tachycardia (HCC)   . S/P ICD (internal cardiac defibrillator) procedure, with upgrade to ICD/CRT-D  Medtronic 11/06/2014  . Type II diabetes mellitus (HCC)      No Known Allergies   Current Outpatient Medications  Medication Sig Dispense Refill  . acetaminophen (TYLENOL) 325 MG tablet Take 1-2 tablets (325-650 mg total) by mouth every 4 (four) hours as needed for mild pain.    Marland Kitchen aspirin EC 81 MG tablet Take 81 mg by mouth daily.    Marland Kitchen atorvastatin (LIPITOR) 80 MG tablet Take 1 tablet (80 mg total) by mouth daily. 15 tablet 0  . carvedilol (COREG) 25 MG tablet TAKE 1 TABLET (25 MG TOTAL) BY MOUTH 2 (TWO) TIMES DAILY. 180 tablet 1  . clopidogrel (PLAVIX) 75 MG tablet TAKE 1 TABLET BY MOUTH EVERY DAY*NEEDS OFFICE VISIT* 20 tablet 0  . clopidogrel (PLAVIX) 75 MG tablet TAKE 1 TABLET BY MOUTH EVERY DAY*NEEDS OFFICE VISIT* 7 tablet 0  . ENTRESTO 49-51 MG TAKE 1 TABLET BY MOUTH 2 TIMES DAILY. 60 tablet 0  . ENTRESTO 97-103 MG TAKE 1 TAB BY MOUTH TWICE A DAY 30 tablet 0  . ENTRESTO 97-103 MG TAKE 1 TABLET BY MOUTH TWICE A DAY 60 tablet 1  . ferrous sulfate 325 (65 FE) MG tablet TAKE 1 TABLET BY MOUTH EVERY DAY *NEEDS APPT FOR MORE REFILLS* 30 tablet 0  . furosemide (LASIX) 40  MG tablet Take 1 tablet (40 mg total) by mouth daily. 30 tablet 6  . hydrALAZINE (APRESOLINE) 50 MG tablet Take 1 tablet (50 mg total) by mouth every 8 (eight) hours. 90 tablet 11  . levothyroxine (SYNTHROID, LEVOTHROID) 25 MCG tablet Take 3.5 tablets (87.5 mcg total) by mouth daily before breakfast. 90 tablet 3  . levothyroxine (SYNTHROID, LEVOTHROID) 25 MCG tablet TAKE 1 TABLET BY MOUTH DAILY BEFORE BREAKFAST. 30 tablet 6  . magnesium oxide (MAG-OX) 400 MG tablet Take 1 tablet (400 mg total) by mouth daily. 90 tablet 3  . Magnesium Oxide 400 (240 Mg) MG TABS TAKE 1 TABLET BY MOUTH EVERY DAY 90 tablet 0  . meclizine (ANTIVERT) 25 MG tablet Take 25 mg by  mouth 3 (three) times daily as needed for dizziness.    . memantine (NAMENDA) 10 MG tablet Take 10 mg by mouth 2 (two) times daily.  5  . nitroGLYCERIN (NITROLINGUAL) 0.4 MG/SPRAY spray Place 1 spray under the tongue every 5 (five) minutes x 3 doses as needed for chest pain.    . potassium chloride SA (K-DUR,KLOR-CON) 20 MEQ tablet Take 1 tablet (20 mEq total) by mouth daily. 90 tablet 0   No current facility-administered medications for this visit.      Past Surgical History:  Procedure Laterality Date  . BI-VENTRICULAR IMPLANTABLE CARDIOVERTER DEFIBRILLATOR UPGRADE N/A 11/04/2014   [place] Levonne Sp[place]eglund.  Upgraded to MDT BiV ICD by Dr Graciela HusbandsKlein for bradycardia dependant PMVT  . CARDIAC CATHETERIZATION  04/07/2012  . CORONARY ARTERY BYPASS GRAFT  ~ 2009   CABG X1  . goiter removed    . LEFT AND RIGHT HEART CATHETERIZATION WITH CORONARY/GRAFT ANGIOGRAM N/A 04/07/2012   Procedure: LEFT AND RIGHT HEART CATHETERIZATION WITH Isabel CapriceORONARY/GRAFT ANGIOGRAM;  Surgeon: Vesta MixerPhilip J Nahser, MD;  [facility];  Service: Cardiovascular;  Laterality: N/A;  . PERCUTANEOUS CORONARY STENT INTERVENTION (PCI-S) N/A 04/11/2012   Procedure: PERCUTANEOUS CORONARY STENT INTERVENTION (PCI-S);  Surgeon: Herby Abrahamhomas D Stuckey, MD;  [facility];  Service: Cardiovascular;  Laterality: N/A;     No Known Allergies    No family history on file.   Social History [name] reports that she has never smoked. She has never used smokeless tobacco. [name] reports that she does not drink alcohol.   Review of Systems CONSTITUTIONAL: No weight loss, fever, chills, weakness or fatigue.  HEENT: Eyes: No visual loss, blurred vision, double vision or yellow sclerae.No hearing loss, sneezing, congestion, runny nose or sore throat.  SKIN: No rash or itching.  CARDIOVASCULAR: per hpi RESPIRATORY: No shortness of breath, cough or sputum.  GASTROINTESTINAL: No anorexia, nausea, vomiting or  diarrhea. No abdominal pain or blood.  GENITOURINARY: No burning on urination, no polyuria NEUROLOGICAL: No headache, dizziness, syncope, paralysis, ataxia, numbness or tingling in the extremities. No change in bowel or bladder control.  MUSCULOSKELETAL: No muscle, back pain, joint pain or stiffness.  LYMPHATICS: No enlarged nodes. No history of splenectomy.  PSYCHIATRIC: No history of depression or anxiety.  ENDOCRINOLOGIC: No reports of sweating, cold or heat intolerance. No polyuria or polydipsia.  Marland Kitchen.   Physical Examination Vitals:   01/17/18 1106  BP: (!) 160/67  Pulse: (!) 58  SpO2: 100%   Vitals:   01/17/18 1106  Height: 5\' 5"  (1.651 m)    Gen: resting comfortably, no acute distress HEENT: no scleral icterus, pupils equal round and reactive, no palptable cervical adenopathy,  CV: RRR, no m/r/g, no jvd  Resp: Clear to auscultation bilaterally GI: abdomen is soft, non-tender, non-distended, normal bowel sounds, no hepatosplenomegaly MSK: extremities are warm, 1+ bilateral LE edema Skin: warm, no rash Neuro:  no focal deficits Psych: appropriate affect   Diagnostic Studies 04/2012 Cath Hemodynamics:   RA:4/4 RV:30/3 PCWP:9/4 PA:29/11  Cardiac Output  Thermodilution:3.39Index of 2.1 Fick :3.78Index 1.88  Arterial Sat:94% PA Sat:60%.  LV pressure:151/8 Aortic pressure:152/67  Angiography   Left Main:smooth and normal  Left anterior Descending: Moderate disease in the proximal segment between 50-70%. The LAD gives off a small diagonal and then has a lucent 70% stenosis. Competitive filling can be seen coming from the IMA graft.  Ramus Intermediate : The ramus intermediate is a moderate vessel and is occluded at it's mid point.   Left Circumflex:The LCx is a small - moderate sized vessel . The OM branches are occluded.  Right Coronary Artery: Large and  dominant. The entire RCA is moderately diffusely diseased. There is a tortous 40% stenosis in the proximal vessel. The mid RCA has a tight 95% stenosis. There is brisk flow through this stenosis. The distal RCA has a 30-40% stenosis . The PDA has a 70% stenosis in the proximal aspect of the Gabriele Loveland. The PLSA has no significant disease.  SVG to Ramus : The more superior graft is assumed to be to the Ramus Johanna Stafford. Nice graft. No stenosis in the graft or native vessel ( assumed to be Ramus)  SVG to Obtuse Marginal :( the inferior graft) Normal graft. Normal native vessel.  LIMA to LAD: Normal graft. The anastomosis is normal. There is competitive flow from the native LAD.  LV Gram:done with hand injection of 7 cc contrast. Severe LV dysfunction. EF 25-30%  Complications: No apparent complications Patient did tolerate procedure well.  Contrast used:92 cc   Conclusions:  1. Severe native CAD . She has patent grafts to the LAD, OM, and assumed ramus Leilanee Righetti. The native RCA does not have any grafts and has a tight 95% stenosis in the mid segment.   The patient was not consented for PCI prior to arriving in the cath lab and she had already been given valium. We will treat with heparin overnight and plan PCI on Tuesday.   In addition, there was some concern about her renal function and limiting contrast. Staging the PCI will allow her to clear all of the contrast given and minimize risk of contrast induced nephropathy.  2. Severe LV dysfunction:She has an EF of 25-30 %. Her filling pressures are low / normal. I do not think she needs any further diuresis over what she is getting currently.     03/2012 Echo LVEF 20-25%, grade II diastolic dysfunction, mod MR,   02/2015 Echo Study Conclusions  - Left ventricle: The cavity size was normal. Wall thickness was increased in a pattern of moderate LVH. Systolic function was severely reduced. The  estimated ejection fraction was in the range of 20% to 25%. Diffuse hypokinesis. Features are consistent with a pseudonormal left ventricular filling pattern, with concomitant abnormal relaxation and increased filling pressure (grade 2 diastolic dysfunction). - Aortic valve: Mildly calcified annulus. Trileaflet; mildly thickened leaflets. Valve area (VTI): 1.73 cm^2. Valve area (Vmax): 1.73 cm^2. Valve area (Vmean): 1.92 cm^2. - Mitral valve: Mildly calcified annulus. Mildly thickened leaflets . There was moderate regurgitation. The MR vena contracta is 0.5 cm. - Left atrium: The atrium was moderately to severely dilated. - Right ventricle: The cavity size was mildly dilated. - Right atrium: The  atrium was mildly dilated. - Tricuspid valve: There was mild-moderate regurgitation. - Technically adequate study.  02/2015 Carotid US 1-39% bilateral ICA stenosis. Left subclavian stenosis in setting of LIMA graft.         Assessment and Plan  1. CAD/ICM/Chronic systolic HF - some recent LE edema, increase lasix to 60mg  daily. We will also start aldactone 12.5mg  daily - check labs in 2 weeks.   2. OSA screen -I asked her  to schedule f/u with Dr Juanetta Gosling. .   3. VT - followed by EP - continue to follow in device clinic  3. HTN - elevated in clinic, monitor with increased diuresis with lasix change and also starting aldactone.   4. Hyperlipidemia -continue statin, order labs.     F/u 1 month   Antoine Poche, M.D.

## 2018-01-23 ENCOUNTER — Encounter: Payer: Self-pay | Admitting: Cardiology

## 2018-02-06 ENCOUNTER — Telehealth: Payer: Self-pay | Admitting: Cardiology

## 2018-02-06 NOTE — Telephone Encounter (Signed)
Mr. Andrey CampanileWilson called stating that on the patient's last office visit Dr. Wyline MoodBranch told patient that he could order 90 days of therapy.

## 2018-02-06 NOTE — Telephone Encounter (Signed)
Phone number listed not in service.

## 2018-02-07 NOTE — Telephone Encounter (Signed)
Attempted to return call - message stating calling restrictions.

## 2018-02-08 NOTE — Telephone Encounter (Signed)
Phone number has calling restrictions

## 2018-02-09 ENCOUNTER — Other Ambulatory Visit: Payer: Self-pay | Admitting: Cardiology

## 2018-02-28 ENCOUNTER — Ambulatory Visit: Payer: Medicare FFS | Admitting: Cardiology

## 2018-02-28 NOTE — Progress Notes (Deleted)
Clinical Summary [name] is a 83 y.o.female seen today for follow up of the following medical problems.   1. CAD/ICM/Chronic systolic HF - hx of CABG in 2009 (LIMA-LAD, SVG-ramus, SVG-OM). Hx of RCA stent 04/2012 - she reports a later recent stent placed in [place].She remains on plavix  02/2015 echo LVEF 20-25%, grade II diastolic dysfunction - she has CRT-D device followed by EP   - no recent SOB/DOE. Occasional LE edema - compliant with meds  - last visit we increased lasix to 60mg  daily due to increased LE edema.  ?repeat echo   2. OSA screen - + snoring, occasional apneic episodes, daytime fatigue. - she has not scheduled her appt with sleep medicine yet.  3. VT - presented to [facility] 10/2014 with SOB, found to be in VT that was converted twice by her ICD. K at that time was 2.8. She was started on amio gtt and transferred to [facility] - she also developed acute pulmonary edema requiring bipap and diuresis - ICD interrogation showed 9 VF events, 1 pace terminated and 3 shock terminated episodes, 6 aborted charges. 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 EP notes also mention complete heart block with brady dependent torsades.  - she underwent ICD upgrade of single chamber ICD to [facility] CRT-D  - she denies any recent palpiations, no syncope.    4. HTN - compliant with meds.   5. Hyperlipidemia - 09/2016 we increased atorva to 80mg  daily.  - compliant with statin    Past Medical History:  Diagnosis Date  . Alzheimer's dementia   . Chronic systolic CHF (congestive heart failure) (HCC)    a. 04/2012 EF 25-30%, viable myocardium by rest thallium redistribution   . Coronary artery disease    a. 2009 CABG x 3(LIMA->LAD, VG->RAMUS, VG->OM;  b. 04/2012 Cath/PCI: LM nl, LAD 50-70p,101m, RI 171m, LCX occluded OM's, RCA 20m, 30-40d, PDA 70p, VG->RI nl, VG->OM nl,  LIMA->LAD nl, EF 25-30%. RCA stented w/ 2.25x28 & 2.5x74mm Xpedition DES'.   . Hyperlipidemia   . Hypertension   . Ischemic cardiomyopathy   . Polymorphic ventricular tachycardia (HCC)   . S/P ICD (internal cardiac defibrillator) procedure, with upgrade to ICD/CRT-D  Medtronic 11/06/2014  . Type II diabetes mellitus (HCC)      No Known Allergies   Current Outpatient Medications  Medication Sig Dispense Refill  . acetaminophen (TYLENOL) 325 MG tablet Take 1-2 tablets (325-650 mg total) by mouth every 4 (four) hours as needed for mild pain.    Marland Kitchen aspirin EC 81 MG tablet Take 81 mg by mouth daily.    Marland Kitchen atorvastatin (LIPITOR) 80 MG tablet Take 1 tablet (80 mg total) by mouth daily. 15 tablet 0  . carvedilol (COREG) 25 MG tablet TAKE 1 TABLET (25 MG TOTAL) BY MOUTH 2 (TWO) TIMES DAILY. 180 tablet 1  . clopidogrel (PLAVIX) 75 MG tablet TAKE 1 TABLET BY MOUTH EVERY DAY*NEEDS OFFICE VISIT* 7 tablet 0  . ENTRESTO 97-103 MG TAKE 1 TAB BY MOUTH TWICE A DAY 30 tablet 0  . ferrous sulfate 325 (65 FE) MG tablet TAKE 1 TABLET BY MOUTH EVERY DAY *NEEDS APPT FOR MORE REFILLS* 30 tablet 0  . furosemide (LASIX) 40 MG tablet Take 1.5 tablets (60 mg total) by mouth daily. 135 tablet 1  . hydrALAZINE (APRESOLINE) 50 MG tablet Take 1 tablet (50 mg total) by mouth every 8 (  eight) hours. 90 tablet 11  . levothyroxine (SYNTHROID, LEVOTHROID) 25 MCG tablet TAKE 1 TABLET BY MOUTH DAILY BEFORE BREAKFAST. 30 tablet 6  . magnesium oxide (MAG-OX) 400 MG tablet Take 1 tablet (400 mg total) by mouth daily. 90 tablet 3  . Magnesium Oxide 400 (240 Mg) MG TABS TAKE 1 TABLET BY MOUTH EVERY DAY 90 tablet 0  . meclizine (ANTIVERT) 25 MG tablet Take 25 mg by mouth 3 (three) times daily as needed for dizziness.    . memantine (NAMENDA) 10 MG tablet Take 10 mg by mouth 2 (two) times daily.  5  . nitroGLYCERIN (NITROLINGUAL) 0.4 MG/SPRAY spray Place 1 spray under the tongue every 5 (five) minutes x 3 doses as needed for chest pain.     . potassium chloride SA (K-DUR,KLOR-CON) 20 MEQ tablet Take 1 tablet (20 mEq total) by mouth daily. 90 tablet 0  . spironolactone (ALDACTONE) 25 MG tablet Take 0.5 tablets (12.5 mg total) by mouth daily. 45 tablet 1   No current facility-administered medications for this visit.      Past Surgical History:  Procedure Laterality Date  . BI-VENTRICULAR IMPLANTABLE CARDIOVERTER DEFIBRILLATOR UPGRADE N/A 11/04/2014   Initial ICD implanted in [place] by Dr Levonne Spiller.  Upgraded to MDT BiV ICD by Dr Graciela Husbands for bradycardia dependant PMVT  . CARDIAC CATHETERIZATION  04/07/2012  . CORONARY ARTERY BYPASS GRAFT  ~ 2009   CABG X1  . goiter removed    . LEFT AND RIGHT HEART CATHETERIZATION WITH CORONARY/GRAFT ANGIOGRAM N/A 04/07/2012   Procedure: LEFT AND RIGHT HEART CATHETERIZATION WITH Isabel Caprice;  Surgeon: Vesta Mixer, MD;  Location: [facility] CATH LAB;  Service: Cardiovascular;  Laterality: N/A;  . PERCUTANEOUS CORONARY STENT INTERVENTION (PCI-S) N/A 04/11/2012   Procedure: PERCUTANEOUS CORONARY STENT INTERVENTION (PCI-S);  Surgeon: Herby Abraham, MD;  Location: [facility] CATH LAB;  Service: Cardiovascular;  Laterality: N/A;     No Known Allergies    No family history on file.   Social History Ms. Rettinger reports that she has never smoked. She has never used smokeless tobacco. Ms. Scism reports that she does not drink alcohol.   Review of Systems CONSTITUTIONAL: No weight loss, fever, chills, weakness or fatigue.  HEENT: Eyes: No visual loss, blurred vision, double vision or yellow sclerae.No hearing loss, sneezing, congestion, runny nose or sore throat.  SKIN: No rash or itching.  CARDIOVASCULAR:  RESPIRATORY: No shortness of breath, cough or sputum.  GASTROINTESTINAL: No anorexia, nausea, vomiting or diarrhea. No abdominal pain or blood.  GENITOURINARY: No burning on urination, no polyuria NEUROLOGICAL: No headache, dizziness, syncope, paralysis, ataxia, numbness or tingling in the  extremities. No change in bowel or bladder control.  MUSCULOSKELETAL: No muscle, back pain, joint pain or stiffness.  LYMPHATICS: No enlarged nodes. No history of splenectomy.  PSYCHIATRIC: No history of depression or anxiety.  ENDOCRINOLOGIC: No reports of sweating, cold or heat intolerance. No polyuria or polydipsia.  Marland Kitchen   Physical Examination There were no vitals filed for this visit. There were no vitals filed for this visit.  Gen: resting comfortably, no acute distress HEENT: no scleral icterus, pupils equal round and reactive, no palptable cervical adenopathy,  CV Resp: Clear to auscultation bilaterally GI: abdomen is soft, non-tender, non-distended, normal bowel sounds, no hepatosplenomegaly MSK: extremities are warm, no edema.  Skin: warm, no rash Neuro:  no focal deficits Psych: appropriate affect   Diagnostic Studies 04/2012 Cath Hemodynamics:   RA:4/4 RV:30/3 PCWP:9/4 PA:29/11  Cardiac Output  Thermodilution:3.39Index of 2.1  Fick :3.78Index 1.88  Arterial Sat:94% PA Sat:60%.  LV pressure:151/8 Aortic pressure:152/67  Angiography   Left Main:smooth and normal  Left anterior Descending: Moderate disease in the proximal segment between 50-70%. The LAD gives off a small diagonal and then has a lucent 70% stenosis. Competitive filling can be seen coming from the IMA graft.  Ramus Intermediate : The ramus intermediate is a moderate vessel and is occluded at it's mid point.   Left Circumflex:The LCx is a small - moderate sized vessel . The OM branches are occluded.  Right Coronary Artery: Large and dominant. The entire RCA is moderately diffusely diseased. There is a tortous 40% stenosis in the proximal vessel. The mid RCA has a tight 95% stenosis. There is brisk flow through this stenosis. The distal RCA has a 30-40% stenosis . The PDA has a 70% stenosis in  the proximal aspect of the Hurbert Duran. The PLSA has no significant disease.  SVG to Ramus : The more superior graft is assumed to be to the Ramus Maraki Macquarrie. Nice graft. No stenosis in the graft or native vessel ( assumed to be Ramus)  SVG to Obtuse Marginal :( the inferior graft) Normal graft. Normal native vessel.  LIMA to LAD: Normal graft. The anastomosis is normal. There is competitive flow from the native LAD.  LV Gram:done with hand injection of 7 cc contrast. Severe LV dysfunction. EF 25-30%  Complications: No apparent complications Patient did tolerate procedure well.  Contrast used:92 cc   Conclusions:  1. Severe native CAD . She has patent grafts to the LAD, OM, and assumed ramus Gilmar Bua. The native RCA does not have any grafts and has a tight 95% stenosis in the mid segment.   The patient was not consented for PCI prior to arriving in the cath lab and she had already been given valium. We will treat with heparin overnight and plan PCI on Tuesday.   In addition, there was some concern about her renal function and limiting contrast. Staging the PCI will allow her to clear all of the contrast given and minimize risk of contrast induced nephropathy.  2. Severe LV dysfunction:She has an EF of 25-30 %. Her filling pressures are low / normal. I do not think she needs any further diuresis over what she is getting currently.     03/2012 Echo LVEF 20-25%, grade II diastolic dysfunction, mod MR,   02/2015 Echo Study Conclusions  - Left ventricle: The cavity size was normal. Wall thickness was increased in a pattern of moderate LVH. Systolic function was severely reduced. The estimated ejection fraction was in the range of 20% to 25%. Diffuse hypokinesis. Features are consistent with a pseudonormal left ventricular filling pattern, with concomitant abnormal relaxation and increased filling pressure (grade 2 diastolic dysfunction). -  Aortic valve: Mildly calcified annulus. Trileaflet; mildly thickened leaflets. Valve area (VTI): 1.73 cm^2. Valve area (Vmax): 1.73 cm^2. Valve area (Vmean): 1.92 cm^2. - Mitral valve: Mildly calcified annulus. Mildly thickened leaflets . There was moderate regurgitation. The MR vena contracta is 0.5 cm. - Left atrium: The atrium was moderately to severely dilated. - Right ventricle: The cavity size was mildly dilated. - Right atrium: The atrium was mildly dilated. - Tricuspid valve: There was mild-moderate regurgitation. - Technically adequate study.  02/2015 Carotid US 1-39% bilateral ICA stenosis. Left subclavian stenosis in setting of LIMA graft.      Assessment and Plan  1. CAD/ICM/Chronic systolic HF - some recent LE edema, increase lasix to 60mg  daily. We will  also start aldactone 12.5mg  daily - check labs in 2 weeks.   2. OSA screen -Iaskedherto schedule f/u with Dr Juanetta GoslingHawkins. .   3. VT - followed by EP - continue to follow in device clinic  3. HTN - elevated in clinic, monitor with increased diuresis with lasix change and also starting aldactone.   4. Hyperlipidemia -continue statin, order labs.        Antoine PocheJonathan F. Ayaz Sondgeroth, M.D.

## 2018-03-01 ENCOUNTER — Encounter: Payer: Self-pay | Admitting: Cardiology

## 2018-03-22 ENCOUNTER — Telehealth: Payer: Self-pay | Admitting: Cardiology

## 2018-03-22 DIAGNOSIS — R2681 Unsteadiness on feet: Secondary | ICD-10-CM

## 2018-03-22 NOTE — Telephone Encounter (Signed)
Do not see this mention at LOV, will confirm with provider

## 2018-03-22 NOTE — Telephone Encounter (Signed)
Ok to order physical therapy for gait instability, weakness   Dominga FerryJ Branch MD

## 2018-03-22 NOTE — Telephone Encounter (Signed)
Duffy BruceGeraldine Fritz (daughter) called wanting to know if Dr. Wyline MoodBranch was going to order physical therapy on patient.  Please call [phone].

## 2018-03-22 NOTE — Telephone Encounter (Signed)
Orders place and forwarded to schedulers

## 2018-03-28 ENCOUNTER — Telehealth: Payer: Self-pay | Admitting: *Deleted

## 2018-03-28 NOTE — Telephone Encounter (Signed)
-----   Message from Megan SalonVicky T Slaughter sent at 03/28/2018  1:48 PM EDT ----- Patient has been scheduled   ----- Message ----- From: Albertine PatriciaAshworth, Carolena Fairbank T, CMA Sent: 03/22/2018  12:06 PM EDT To: Megan SalonVicky T Slaughter  Can you refer this pt to PT for gait instability - I put in the orders - thank you  [facility]

## 2018-03-30 ENCOUNTER — Other Ambulatory Visit: Payer: Self-pay | Admitting: Cardiology

## 2018-04-11 ENCOUNTER — Other Ambulatory Visit: Payer: Self-pay | Admitting: Cardiovascular Disease

## 2018-04-12 ENCOUNTER — Encounter (HOSPITAL_COMMUNITY): Payer: Self-pay

## 2018-04-12 ENCOUNTER — Other Ambulatory Visit: Payer: Self-pay

## 2018-04-12 ENCOUNTER — Ambulatory Visit (HOSPITAL_COMMUNITY): Payer: Medicare FFS | Attending: Cardiology

## 2018-04-12 DIAGNOSIS — M6281 Muscle weakness (generalized): Secondary | ICD-10-CM | POA: Diagnosis present

## 2018-04-12 DIAGNOSIS — R2689 Other abnormalities of gait and mobility: Secondary | ICD-10-CM | POA: Diagnosis present

## 2018-04-12 DIAGNOSIS — R262 Difficulty in walking, not elsewhere classified: Secondary | ICD-10-CM | POA: Diagnosis present

## 2018-04-12 NOTE — Patient Instructions (Signed)
Access Code: [email or web address]  URL: [email or web address]  Date: 04/12/2018  Prepared by: Jac Canavan   Exercises Seated Heel Toe Raises - 10 reps - 3 sets - 3-5seconds hold - 1x daily - 7x weekly Seated March - 10 reps - 3 sets - 3-5seconds hold - 1x daily - 7x weekly Seated Long Arc Quad - 10 reps - 3 sets - 2-3seconds hold - 1x daily - 7x weekly Seated Gluteal Sets - 10 reps - 3 sets - 3-5seconds hold - 1x daily - 7x weekly

## 2018-04-12 NOTE — Therapy (Signed)
[facility] [address] [place], [place], [address] Phone: [phone]   Fax:  [phone]  Physical Therapy Evaluation  Patient Details  Name: [name] MRN: [id number] Date of Birth: [birth year] Referring Provider: Dina Rich, MD   Encounter Date: 04/12/2018  PT End of Session - 04/12/18 1202    Visit Number  1    Number of Visits  9    Date for PT Re-Evaluation  05/10/18    Authorization Type  [employer]    Authorization Time Period  04/12/18 to 05/10/18    Authorization - Visit Number  1    Authorization - Number of Visits  10    PT Start Time  1114    PT Stop Time  1151    PT Time Calculation (min)  37 min    Equipment Utilized During Treatment  Gait belt    Activity Tolerance  Patient limited by fatigue;Patient tolerated treatment well    Behavior During Therapy  [facility] for tasks assessed/performed       Past Medical History:  Diagnosis Date  . Alzheimer's dementia   . Chronic systolic CHF (congestive heart failure) (HCC)    a. 04/2012 EF 25-30%, viable myocardium by rest thallium redistribution   . Coronary artery disease    a. 2009 CABG x 3(LIMA->LAD, VG->RAMUS, VG->OM;  b. 04/2012 Cath/PCI: LM nl, LAD 50-70p,27m, RI 125m, LCX occluded OM's, RCA 10m, 30-40d, PDA 70p, VG->RI nl, VG->OM nl, LIMA->LAD nl, EF 25-30%. RCA stented w/ 2.25x28 & 2.5x28mm Xpedition DES'.   . Hyperlipidemia   . Hypertension   . Ischemic cardiomyopathy   . Polymorphic ventricular tachycardia (HCC)   . S/P ICD (internal cardiac defibrillator) procedure, with upgrade to ICD/CRT-D  Medtronic 11/06/2014  . Type II diabetes mellitus (HCC)     Past Surgical History:  Procedure Laterality Date  . BI-VENTRICULAR IMPLANTABLE CARDIOVERTER DEFIBRILLATOR UPGRADE N/A 11/04/2014   Initial ICD implanted in [place] by Dr Levonne Spiller.  Upgraded to MDT BiV ICD by Dr Graciela Husbands for bradycardia dependant PMVT  . CARDIAC CATHETERIZATION  04/07/2012  . CORONARY ARTERY  BYPASS GRAFT  ~ 2009   CABG X1  . goiter removed    . LEFT AND RIGHT HEART CATHETERIZATION WITH CORONARY/GRAFT ANGIOGRAM N/A 04/07/2012   Procedure: LEFT AND RIGHT HEART CATHETERIZATION WITH Isabel Caprice;  Surgeon: Vesta Mixer, MD;  Location: [facility] CATH LAB;  Service: Cardiovascular;  Laterality: N/A;  . PERCUTANEOUS CORONARY STENT INTERVENTION (PCI-S) N/A 04/11/2012   Procedure: PERCUTANEOUS CORONARY STENT INTERVENTION (PCI-S);  Surgeon: Herby Abraham, MD;  Location: [facility] CATH LAB;  Service: Cardiovascular;  Laterality: N/A;    There were no vitals filed for this visit.   Subjective Assessment - 04/12/18 1119    Subjective  Pt reports that she has been referred to OPPT due to leg weakness and difficulty walking. She and her husband report gradually worsening BLE weakness over the las 1-1.5years. She was receiving HHPT about a year ago. She denies any falls or close calls in the last 6 months. She reports that she can't walk very far. Her husband states that she very rarely walks at home. He provides physical assistance for her to perform bed mobility, STS transfers, and SPT. When she does walk, she uses a rollator and her husband physically assists her with that as well.     Limitations  Walking;Standing;House hold activities    How long can you sit comfortably?  no issues     How  long can you stand comfortably?  maybe 2-3 mins    How long can you walk comfortably?  couple mins with rollator    Patient Stated Goals  walk    Currently in Pain?  No/denies         [facility] PT Assessment - 04/12/18 0001      Assessment   Medical Diagnosis  Gait instability    Referring Provider  Dina Rich, MD    Onset Date/Surgical Date  --   1-1.5YA   Next MD Visit  04/26/18    Prior Therapy  HHPT for same thing 1 year ago, no OPPT      Balance Screen   Has the patient fallen in the past 6 months  No    Has the patient had a decrease in activity level because of a fear of falling?   Yes     Is the patient reluctant to leave their home because of a fear of falling?   No      Prior Function   Level of Independence  Needs assistance with ADLs;Needs assistance with gait;Needs assistance with transfers    Leisure  watch TV      Cognition   Overall Cognitive Status  History of cognitive impairments - at baseline      ROM / Strength   AROM / PROM / Strength  Strength      Strength   Strength Assessment Site  Hip;Knee;Ankle    Right Hip Flexion  4/5    Right Hip Extension  2+/5   based on functional assessment   Right Hip ABduction  2+/5   based on functional assessment   Left Hip Flexion  4/5    Left Hip Extension  2+/5   based on functoinal assessment   Left Hip ABduction  2+/5   based on functional assessment   Right Knee Flexion  4+/5   seated   Right Knee Extension  4/5   through available range   Left Knee Flexion  4/5   seated   Left Knee Extension  4-/5    Right Ankle Dorsiflexion  4/5    Left Ankle Dorsiflexion  4/5      Transfers   Transfers  Sit to Stand;Stand to Sit    Sit to Stand  2: Max assist;3: Mod assist;With upper extremity assist;Multiple attempts;Uncontrolled descent    Sit to Stand Details  Tactile cues for initiation;Tactile cues for sequencing;Verbal cues for sequencing;Verbal cues for technique;Verbal cues for precautions/safety;Manual facilitation for weight shifting    Sit to Stand Details (indicate cue type and reason)  max cueing for proper hand placement, forward weight shifting, control, and safety during STS with RW    Stand to Sit  3: Mod assist    Stand to Sit Details (indicate cue type and reason)  Verbal cues for precautions/safety    Stand to Sit Details  cues for safe stand to sit      Ambulation/Gait   Gait Comments  unable to assess due to weakness and poor standing balance      Balance   Balance Assessed  Yes      Static Standing Balance   Static Standing - Balance Support  Bilateral upper extremity supported     Static Standing - Level of Assistance  2: Max assist    Static Standing - Comment/# of Minutes  BLE on firm surface with BUE assist on RW -- pt only able to stand for ~7seconds with max A  Objective measurements completed on examination: See above findings.         PT Education - 04/12/18 1202    Education Details  exam findings, POC, HEP    Person(s) Educated  Patient;Spouse    Methods  Explanation;Demonstration;Handout    Comprehension  Verbalized understanding;Need further instruction       PT Short Term Goals - 04/12/18 1215      PT SHORT TERM GOAL #1   Title  Pt and caregiver will be independent with HEP and perform consistently in order to improve pt's strength.    Time  2    Period  Weeks    Status  New    Target Date  04/26/18      PT SHORT TERM GOAL #2   Title  Pt will be able to stand for 10 sec with RW and mod assist for balance in order to demo improved functional strength and to improve transfers at home.    Time  2    Period  Weeks    Status  New        PT Long Term Goals - 04/12/18 1215      PT LONG TERM GOAL #1   Title  Pt will have 1/2 grade improvement in MMT in order to improve her transfers and functional mobility at home with less assistance from caregiver.    Time  4    Period  Weeks    Status  New    Target Date  05/10/18      PT LONG TERM GOAL #2   Title  Pt will be able to perform STS transfers and SPT chair <> bed (level surface) with moderate assistance and minimal cues for proper technique in order to improve her function at home and decrease stress on caregiver.    Time  4    Period  Weeks    Status  New      PT LONG TERM GOAL #3   Title  Pt will be able to stand for 20 sec or > with RW and min A for balance or better to further demo improved functional strength and decrease need for assistance with transfers at home.    Time  4    Period  Weeks    Status  New      PT LONG TERM GOAL #4   Title  Pt will be able  to ambulate 47ft with RW and mod A for balance or better in order to demo improved functional strength and maximize her function at home.     Time  4    Period  Weeks    Status  New             Plan - 04/12/18 1203    Clinical Impression Statement  Pt is 83YO F who present to OPPT with her husband and she is being referred to therapy services for gait instability and muscle weakness. Per husband, pt very rarely walks but when she does, she uses a rollator and requires physical assistance from him. Mostly, she performs STS and SPT bed <> w/c or BSC with physical assistance from her husband and she also requires assistance for bed mobility. Pt required mod-max A for STS transfers this date with max cueing for proper technique, safety, and hand placement. Unable to ambulate today due to weakness and poor standing balance inhibiting gait. Pt would benefit from skilled PT intervention to address BLE weakness in order to promote improved  functional mobility and home and decrease stress and strain on caregiver.    History and Personal Factors relevant to plan of care:  extensive cardiac medical history; Alzheimer's Dementia    Clinical Presentation  Stable    Clinical Presentation due to:  MMT, functional strength, functional mobility, transfers, balance    Clinical Decision Making  Low    Rehab Potential  Fair    PT Frequency  2x / week    PT Duration  4 weeks    PT Treatment/Interventions  ADLs/Self Care Home Management;Aquatic Therapy;Cryotherapy;Electrical Stimulation;Moist Heat;Ultrasound;DME Instruction;Gait training;Functional mobility training;Therapeutic activities;Therapeutic exercise;Balance training;Neuromuscular re-education;Patient/family education;Wheelchair mobility training;Manual techniques;Passive range of motion;Taping    PT Next Visit Plan  review goals and HEP; begin mat level strengthening activities, continue to perform STS for strength/safety, attempt gait with w/c follow  when able     PT Home Exercise Plan  eval: seated heel and toe, seated march, LAQ, seated glute set    Consulted and Agree with Plan of Care  Patient;Family member/caregiver    Family Member Consulted  husband       Patient will benefit from skilled therapeutic intervention in order to improve the following deficits and impairments:  Decreased activity tolerance, Decreased balance, Decreased endurance, Decreased mobility, Decreased range of motion, Decreased strength, Difficulty walking, Hypomobility, Increased muscle spasms, Impaired flexibility, Impaired UE functional use, Improper body mechanics, Postural dysfunction, Pain  Visit Diagnosis: Muscle weakness (generalized) - Plan: PT plan of care cert/re-cert  Difficulty in walking, not elsewhere classified - Plan: PT plan of care cert/re-cert  Other abnormalities of gait and mobility - Plan: PT plan of care cert/re-cert     Problem List Patient Active Problem List   Diagnosis Date Noted  . Polymorphic ventricular tachycardia (HCC) 08/15/2015  . S/P ICD (internal cardiac defibrillator) procedure, with upgrade to ICD/CRT-D  Medtronic 11/06/2014  . Cardiomyopathy, ischemic EF 25-30% 11/06/2014  . Weakness 11/06/2014  . AICD (automatic cardioverter/defibrillator) present   . Torsades de pointes with 3 shocks, another pace terminated 10/31/2014  . Mobitz type 2 second degree atrioventricular block 10/31/2014  . Complete heart block (HCC) 10/31/2014  . CAD (coronary artery disease) 04/13/2012  . Acute systolic CHF (congestive heart failure) (HCC) 04/12/2012  . Hypertension   . High cholesterol   . Type II diabetes mellitus (HCC)         Jac Canavan PT, DPT  [facility] [address] [place], [place], [address] Phone: [phone]   Fax:  [phone]  Name: [name] MRN: [id number] Date of Birth: [birth year]

## 2018-04-18 ENCOUNTER — Ambulatory Visit (HOSPITAL_COMMUNITY): Payer: Medicare FFS

## 2018-04-18 ENCOUNTER — Encounter (HOSPITAL_COMMUNITY): Payer: Self-pay

## 2018-04-18 DIAGNOSIS — M6281 Muscle weakness (generalized): Secondary | ICD-10-CM | POA: Diagnosis not present

## 2018-04-18 DIAGNOSIS — R2689 Other abnormalities of gait and mobility: Secondary | ICD-10-CM

## 2018-04-18 DIAGNOSIS — R262 Difficulty in walking, not elsewhere classified: Secondary | ICD-10-CM

## 2018-04-18 NOTE — Patient Instructions (Signed)
Flexors, Supine Bridge    Lie supine, feet shoulder-width apart. Lift hips toward ceiling. Hold 3-5___ seconds. Repeat 10___ times per session. Do _1__ sessions per day.  Copyright  VHI. All rights reserved.  Long [employer]    Straighten leg and try to hold it 3-5____ seconds.  Repeat _10___ times. Do _1___ sessions a day.  http://gt2.exer.us/311   Copyright  VHI. All rights reserved.

## 2018-04-18 NOTE — Therapy (Signed)
[facility] [address] [place], [place], [address] Phone: [phone]   Fax:  [phone]  Physical Therapy Treatment  Patient Details  Name: [name] MRN: [id number] Date of Birth: Nov 28, 1934 Referring Provider: Dina Rich, MD   Encounter Date: 04/18/2018  PT End of Session - 04/18/18 1042    Visit Number  2    Number of Visits  9    Date for PT Re-Evaluation  05/10/18    Authorization Type  [employer]    Authorization Time Period  04/12/18 to 05/10/18    Authorization - Visit Number  2    Authorization - Number of Visits  10    PT Start Time  1045    PT Stop Time  1130    PT Time Calculation (min)  45 min    Equipment Utilized During Treatment  Gait belt    Activity Tolerance  Patient limited by fatigue;Patient tolerated treatment well    Behavior During Therapy  [facility] for tasks assessed/performed       Past Medical History:  Diagnosis Date  . Alzheimer's dementia   . Chronic systolic CHF (congestive heart failure) (HCC)    a. 04/2012 EF 25-30%, viable myocardium by rest thallium redistribution   . Coronary artery disease    a. 2009 CABG x 3(LIMA->LAD, VG->RAMUS, VG->OM;  b. 04/2012 Cath/PCI: LM nl, LAD 50-70p,41m, RI 164m, LCX occluded OM's, RCA 66m, 30-40d, PDA 70p, VG->RI nl, VG->OM nl, LIMA->LAD nl, EF 25-30%. RCA stented w/ 2.25x28 & 2.5x63mm Xpedition DES'.   . Hyperlipidemia   . Hypertension   . Ischemic cardiomyopathy   . Polymorphic ventricular tachycardia (HCC)   . S/P ICD (internal cardiac defibrillator) procedure, with upgrade to ICD/CRT-D  Medtronic 11/06/2014  . Type II diabetes mellitus (HCC)     Past Surgical History:  Procedure Laterality Date  . BI-VENTRICULAR IMPLANTABLE CARDIOVERTER DEFIBRILLATOR UPGRADE N/A 11/04/2014   Initial ICD implanted in [place] by Dr Levonne Spiller.  Upgraded to MDT BiV ICD by Dr Graciela Husbands for bradycardia dependant PMVT  . CARDIAC CATHETERIZATION  04/07/2012  . CORONARY ARTERY  BYPASS GRAFT  ~ 2009   CABG X1  . goiter removed    . LEFT AND RIGHT HEART CATHETERIZATION WITH CORONARY/GRAFT ANGIOGRAM N/A 04/07/2012   Procedure: LEFT AND RIGHT HEART CATHETERIZATION WITH Isabel Caprice;  Surgeon: Vesta Mixer, MD;  Location: [facility] CATH LAB;  Service: Cardiovascular;  Laterality: N/A;  . PERCUTANEOUS CORONARY STENT INTERVENTION (PCI-S) N/A 04/11/2012   Procedure: PERCUTANEOUS CORONARY STENT INTERVENTION (PCI-S);  Surgeon: Herby Abraham, MD;  Location: [facility] CATH LAB;  Service: Cardiovascular;  Laterality: N/A;    There were no vitals filed for this visit.  Subjective Assessment - 04/18/18 1042    Subjective  No new complaints. Feeling fine. Husband states transfers at home he has patient put her arms around his neck.     Limitations  Walking;Standing;House hold activities    How long can you sit comfortably?  no issues     How long can you stand comfortably?  maybe 2-3 mins    How long can you walk comfortably?  couple mins with rollator    Patient Stated Goals  walk    Currently in Pain?  No/denies                       [facility] Adult PT Treatment/Exercise - 04/18/18 0001      Bed Mobility   Bed Mobility  Rolling  Left;Left Sidelying to Sit;Sit to Sidelying Left    Rolling Left  Supervision/Verbal cueing    Left Sidelying to Sit  Minimal Assistance - Patient >75%   extra time   Sit to Sidelying Left  Contact Guard/Touching assist   extra time     Transfers   Transfers  Sit to Stand;Stand to Sit    Sit to Stand  2: Max assist;3: Mod assist;With upper extremity assist;Multiple attempts;Uncontrolled descent    Sit to Stand Details  Tactile cues for initiation;Tactile cues for sequencing;Verbal cues for sequencing;Verbal cues for technique;Verbal cues for precautions/safety;Manual facilitation for weight shifting    Sit to Stand Details (indicate cue type and reason)  max cueing for proper hand placement, forward weight shifting, control and  safety during STS w/ RW    Stand to Sit  2: Max assist    Stand to Sit Details (indicate cue type and reason)  Verbal cues for precautions/safety    Stand to Sit Details  cues for safe stand to sit      Exercises   Exercises  Knee/Hip      Lumbar Exercises: Seated   Long Arc Quad on Chair  Strengthening;Both;1 set;10 reps      Lumbar Exercises: Supine   Clam  10 reps;3 seconds      Knee/Hip Exercises: Stretches   Passive Hamstring Stretch  Both;2 reps;30 seconds    ITB Stretch  Both;2 reps;30 seconds      Knee/Hip Exercises: Seated   Other Seated Knee/Hip Exercises  heel/toe raise x10 reps    Marching  Strengthening;Both;1 set;10 reps      Knee/Hip Exercises: Supine   Quad Sets  AAROM;Strengthening;Both;1 set;10 reps    Bridges  Strengthening;Both;1 set;10 reps    Bridges Limitations  3 sec hold             PT Education - 04/18/18 1144    Education Details  reviewed goals and HEP, added new HEP, clinical reasoning for exercises chosen to perform.    Person(s) Educated  Patient;Spouse    Methods  Explanation;Demonstration;Handout    Comprehension  Verbalized understanding;Need further instruction       PT Short Term Goals - 04/12/18 1215      PT SHORT TERM GOAL #1   Title  Pt and caregiver will be independent with HEP and perform consistently in order to improve pt's strength.    Time  2    Period  Weeks    Status  New    Target Date  04/26/18      PT SHORT TERM GOAL #2   Title  Pt will be able to stand for 10 sec with RW and mod assist for balance in order to demo improved functional strength and to improve transfers at home.    Time  2    Period  Weeks    Status  New        PT Long Term Goals - 04/12/18 1215      PT LONG TERM GOAL #1   Title  Pt will have 1/2 grade improvement in MMT in order to improve her transfers and functional mobility at home with less assistance from caregiver.    Time  4    Period  Weeks    Status  New    Target Date   05/10/18      PT LONG TERM GOAL #2   Title  Pt will be able to perform STS transfers and SPT chair <>  bed (level surface) with moderate assistance and minimal cues for proper technique in order to improve her function at home and decrease stress on caregiver.    Time  4    Period  Weeks    Status  New      PT LONG TERM GOAL #3   Title  Pt will be able to stand for 20 sec or > with RW and min A for balance or better to further demo improved functional strength and decrease need for assistance with transfers at home.    Time  4    Period  Weeks    Status  New      PT LONG TERM GOAL #4   Title  Pt will be able to ambulate 36ft with RW and mod A for balance or better in order to demo improved functional strength and maximize her function at home.     Time  4    Period  Weeks    Status  New            Plan - 04/18/18 1042    Clinical Impression Statement  Patient and spouse present for session. Reviewed evaluation goals and HEP. Session focused on functional sit-to stand strength, bed mobility, supine mat exercises for LE strengthening and building HEP for carryover and strength increases. Patient will continue to benefit from skilled PT to address functional strength and balance deficits and decrease caregiver load and reduce risk of injury and falls. Continue with current plan, progress as able.     Rehab Potential  Fair    PT Frequency  2x / week    PT Duration  4 weeks    PT Treatment/Interventions  ADLs/Self Care Home Management;Aquatic Therapy;Cryotherapy;Electrical Stimulation;Moist Heat;Ultrasound;DME Instruction;Gait training;Functional mobility training;Therapeutic activities;Therapeutic exercise;Balance training;Neuromuscular re-education;Patient/family education;Wheelchair mobility training;Manual techniques;Passive range of motion;Taping    PT Next Visit Plan  review HEP and ensure carry over; continue mat level strengthening activities, continue to perform STS for  strength/safety, bed mobility, attempt gait with w/c follow when able     PT Home Exercise Plan  eval: seated heel and toe, seated march, LAQ, seated glute set; 04/18/18 - supine bridge    Consulted and Agree with Plan of Care  Patient;Family member/caregiver    Family Member Consulted  husband       Patient will benefit from skilled therapeutic intervention in order to improve the following deficits and impairments:  Decreased activity tolerance, Decreased balance, Decreased endurance, Decreased mobility, Decreased range of motion, Decreased strength, Difficulty walking, Hypomobility, Increased muscle spasms, Impaired flexibility, Impaired UE functional use, Improper body mechanics, Postural dysfunction, Pain  Visit Diagnosis: Muscle weakness (generalized)  Difficulty in walking, not elsewhere classified  Other abnormalities of gait and mobility     Problem List Patient Active Problem List   Diagnosis Date Noted  . Polymorphic ventricular tachycardia (HCC) 08/15/2015  . S/P ICD (internal cardiac defibrillator) procedure, with upgrade to ICD/CRT-D  Medtronic 11/06/2014  . Cardiomyopathy, ischemic EF 25-30% 11/06/2014  . Weakness 11/06/2014  . AICD (automatic cardioverter/defibrillator) present   . Torsades de pointes with 3 shocks, another pace terminated 10/31/2014  . Mobitz type 2 second degree atrioventricular block 10/31/2014  . Complete heart block (HCC) 10/31/2014  . CAD (coronary artery disease) 04/13/2012  . Acute systolic CHF (congestive heart failure) (HCC) 04/12/2012  . Hypertension   . High cholesterol   . Type II diabetes mellitus (HCC)     Katina Dung. Hartnett-Rands, MS, PT [facility] [phone] 04/18/2018, 11:51 AM  [facility] [address] [place], [place], [address] Phone: [phone]   Fax:  [phone]  Name: [name] MRN: [id number] Date of Birth: [birth year]

## 2018-04-20 ENCOUNTER — Telehealth (HOSPITAL_COMMUNITY): Payer: Self-pay

## 2018-04-20 ENCOUNTER — Ambulatory Visit (HOSPITAL_COMMUNITY): Payer: Medicare FFS

## 2018-04-20 NOTE — Telephone Encounter (Signed)
Pt's husband called at 9:59 am stating she could not come to her 11am apptment. Pt's husband was informed that the apptment was at 9:45 today and next tuesday. NF 04/20/18

## 2018-04-20 NOTE — Progress Notes (Deleted)
PT Cancellation Note  Patient Details Name: Bernadene PersonDella Sobel MRN: [id number] DOB: 1935/02/04   Cancelled Treatment:      Pt's husband called at 9:59 am stating she could not come to her 11am apptment. Pt's husband was informed that the apptment was at 9:45 today and next tuesday. NF 04/20/18   Katina DungBarbara D. Hartnett-Rands, MS, PT [facility] [phone] 04/20/2018, 10:13 AM

## 2018-04-25 ENCOUNTER — Ambulatory Visit (HOSPITAL_COMMUNITY): Payer: Medicare FFS | Admitting: Physical Therapy

## 2018-04-25 DIAGNOSIS — R262 Difficulty in walking, not elsewhere classified: Secondary | ICD-10-CM

## 2018-04-25 DIAGNOSIS — R2689 Other abnormalities of gait and mobility: Secondary | ICD-10-CM

## 2018-04-25 DIAGNOSIS — M6281 Muscle weakness (generalized): Secondary | ICD-10-CM | POA: Diagnosis not present

## 2018-04-25 NOTE — Therapy (Signed)
[facility] [address] [place], [place], [address] Phone: [phone]   Fax:  [phone]  Physical Therapy Treatment  Patient Details  Name: [name] MRN: [id number] Date of Birth: [birth year] Referring Provider: Dina Rich, MD   Encounter Date: 04/25/2018  PT End of Session - 04/25/18 1151    Visit Number  3    Number of Visits  9    Date for PT Re-Evaluation  05/10/18    Authorization Type  [employer]    Authorization Time Period  04/12/18 to 05/10/18    Authorization - Visit Number  3    Authorization - Number of Visits  10    PT Start Time  0948    PT Stop Time  1030    PT Time Calculation (min)  42 min    Equipment Utilized During Treatment  Gait belt    Activity Tolerance  Patient limited by fatigue;Patient tolerated treatment well    Behavior During Therapy  [facility] for tasks assessed/performed       Past Medical History:  Diagnosis Date  . Alzheimer's dementia   . Chronic systolic CHF (congestive heart failure) (HCC)    a. 04/2012 EF 25-30%, viable myocardium by rest thallium redistribution   . Coronary artery disease    a. 2009 CABG x 3(LIMA->LAD, VG->RAMUS, VG->OM;  b. 04/2012 Cath/PCI: LM nl, LAD 50-70p,31m, RI 170m, LCX occluded OM's, RCA 76m, 30-40d, PDA 70p, VG->RI nl, VG->OM nl, LIMA->LAD nl, EF 25-30%. RCA stented w/ 2.25x28 & 2.5x60mm Xpedition DES'.   . Hyperlipidemia   . Hypertension   . Ischemic cardiomyopathy   . Polymorphic ventricular tachycardia (HCC)   . S/P ICD (internal cardiac defibrillator) procedure, with upgrade to ICD/CRT-D  Medtronic 11/06/2014  . Type II diabetes mellitus (HCC)     Past Surgical History:  Procedure Laterality Date  . BI-VENTRICULAR IMPLANTABLE CARDIOVERTER DEFIBRILLATOR UPGRADE N/A 11/04/2014   Initial ICD implanted in [place] by Dr Levonne Spiller.  Upgraded to MDT BiV ICD by Dr Graciela Husbands for bradycardia dependant PMVT  . CARDIAC CATHETERIZATION  04/07/2012  . CORONARY ARTERY  BYPASS GRAFT  ~ 2009   CABG X1  . goiter removed    . LEFT AND RIGHT HEART CATHETERIZATION WITH CORONARY/GRAFT ANGIOGRAM N/A 04/07/2012   Procedure: LEFT AND RIGHT HEART CATHETERIZATION WITH Isabel Caprice;  Surgeon: Vesta Mixer, MD;  Location: [facility] CATH LAB;  Service: Cardiovascular;  Laterality: N/A;  . PERCUTANEOUS CORONARY STENT INTERVENTION (PCI-S) N/A 04/11/2012   Procedure: PERCUTANEOUS CORONARY STENT INTERVENTION (PCI-S);  Surgeon: Herby Abraham, MD;  Location: [facility] CATH LAB;  Service: Cardiovascular;  Laterality: N/A;    There were no vitals filed for this visit.  Subjective Assessment - 04/25/18 0959    Subjective  no pain or issues today.     Currently in Pain?  No/denies                       [facility] Adult PT Treatment/Exercise - 04/25/18 0001      Transfers   Transfers  Sit to Stand;Stand to Sit    Sit to Stand  2: Max assist;3: Mod assist;With upper extremity assist;Multiple attempts;Uncontrolled descent    Sit to Stand Details  Tactile cues for initiation;Tactile cues for sequencing;Verbal cues for sequencing;Verbal cues for technique;Verbal cues for precautions/safety;Manual facilitation for weight shifting    Sit to Stand Details (indicate cue type and reason)  max cueing for proper hand placement, forward weight shifting, control  and safety during STS w/ RW    Stand to Sit  2: Max assist    Stand to Sit Details (indicate cue type and reason)  Verbal cues for precautions/safety    Stand to Sit Details  cues for safe stand to sit      Lumbar Exercises: Supine   Clam  10 reps;3 seconds      Knee/Hip Exercises: Stretches   Passive Hamstring Stretch  Both;2 reps;30 seconds      Knee/Hip Exercises: Seated   Long Arc Electronics engineer  Strengthening;Both;1 set;10 reps    Sit to [employer]  3 sets;with UE support;Other (comment)   attempted standing to walker, however unable     Knee/Hip Exercises: Supine   Bridges   Strengthening;Both;10 reps;2 sets    Bridges Limitations  3 sec hold    Straight Leg Raises  Both;10 reps               PT Short Term Goals - 04/12/18 1215      PT SHORT TERM GOAL #1   Title  Pt and caregiver will be independent with HEP and perform consistently in order to improve pt's strength.    Time  2    Period  Weeks    Status  New    Target Date  04/26/18      PT SHORT TERM GOAL #2   Title  Pt will be able to stand for 10 sec with RW and mod assist for balance in order to demo improved functional strength and to improve transfers at home.    Time  2    Period  Weeks    Status  New        PT Long Term Goals - 04/12/18 1215      PT LONG TERM GOAL #1   Title  Pt will have 1/2 grade improvement in MMT in order to improve her transfers and functional mobility at home with less assistance from caregiver.    Time  4    Period  Weeks    Status  New    Target Date  05/10/18      PT LONG TERM GOAL #2   Title  Pt will be able to perform STS transfers and SPT chair <> bed (level surface) with moderate assistance and minimal cues for proper technique in order to improve her function at home and decrease stress on caregiver.    Time  4    Period  Weeks    Status  New      PT LONG TERM GOAL #3   Title  Pt will be able to stand for 20 sec or > with RW and min A for balance or better to further demo improved functional strength and decrease need for assistance with transfers at home.    Time  4    Period  Weeks    Status  New      PT LONG TERM GOAL #4   Title  Pt will be able to ambulate 92ft with RW and mod A for balance or better in order to demo improved functional strength and maximize her function at home.     Time  4    Period  Weeks    Status  New            Plan - 04/25/18 1152    Clinical Impression Statement  Pt with poor motivation this session.  Unable to  recall any exercises completing at home with exception of ankle pumps.  Pt asking husband to  come "get me up" instead of working mm/problem solving herself.  instructed with logroll, however became distracted multiple times, scratching her head and not using UE to help push self up.  Finally able to complete with tactile cues from therapist.  No physical assist geiven by therapist to transfer supine-sit.  Pt requires min assist with stand-pivot due to poor safety, not using UE appropriately or lining up body for activity.  Attempted sit to stand with walker, however unable to stand fully erect with bottom posteror to BOS.  Despite cues, and manual assist, unable to get pateint standing erect with walker.   Encouraged pateint to work harder on these things for improvment and more functional indlependence.     Rehab Potential  Fair    PT Frequency  2x / week    PT Duration  4 weeks    PT Treatment/Interventions  ADLs/Self Care Home Management;Aquatic Therapy;Cryotherapy;Electrical Stimulation;Moist Heat;Ultrasound;DME Instruction;Gait training;Functional mobility training;Therapeutic activities;Therapeutic exercise;Balance training;Neuromuscular re-education;Patient/family education;Wheelchair mobility training;Manual techniques;Passive range of motion;Taping    PT Next Visit Plan  review HEP and ensure carry over; continue mat level strengthening activities, continue to perform STS for strength/safety, bed mobility, attempt gait with w/c follow when able     PT Home Exercise Plan  eval: seated heel and toe, seated march, LAQ, seated glute set; 04/18/18 - supine bridge    Consulted and Agree with Plan of Care  Patient;Family member/caregiver    Family Member Consulted  husband       Patient will benefit from skilled therapeutic intervention in order to improve the following deficits and impairments:  Decreased activity tolerance, Decreased balance, Decreased endurance, Decreased mobility, Decreased range of motion, Decreased strength, Difficulty walking, Hypomobility, Increased muscle spasms,  Impaired flexibility, Impaired UE functional use, Improper body mechanics, Postural dysfunction, Pain  Visit Diagnosis: Muscle weakness (generalized)  Difficulty in walking, not elsewhere classified  Other abnormalities of gait and mobility     Problem List Patient Active Problem List   Diagnosis Date Noted  . Polymorphic ventricular tachycardia (HCC) 08/15/2015  . S/P ICD (internal cardiac defibrillator) procedure, with upgrade to ICD/CRT-D  Medtronic 11/06/2014  . Cardiomyopathy, ischemic EF 25-30% 11/06/2014  . Weakness 11/06/2014  . AICD (automatic cardioverter/defibrillator) present   . Torsades de pointes with 3 shocks, another pace terminated 10/31/2014  . Mobitz type 2 second degree atrioventricular block 10/31/2014  . Complete heart block (HCC) 10/31/2014  . CAD (coronary artery disease) 04/13/2012  . Acute systolic CHF (congestive heart failure) (HCC) 04/12/2012  . Hypertension   . High cholesterol   . Type II diabetes mellitus [facility](HCC)    Lurena Nidamy B Vimal Derego, PTA/CLT [phone]  Lurena NidaFrazier, Tyniya Kuyper B 04/25/2018, 11:58 AM  [facility] [address] [place], [place], [address] Phone: [phone]   Fax:  [phone]  Name: [name] MRN: [id number] Date of Birth: 08-21-1934

## 2018-04-27 ENCOUNTER — Telehealth (HOSPITAL_COMMUNITY): Payer: Self-pay | Admitting: Internal Medicine

## 2018-04-27 ENCOUNTER — Ambulatory Visit (HOSPITAL_COMMUNITY): Payer: Medicare FFS | Admitting: Physical Therapy

## 2018-04-27 NOTE — Telephone Encounter (Signed)
04/27/18  husband called to cx said that her legs were really  hurting her

## 2018-05-02 ENCOUNTER — Ambulatory Visit (HOSPITAL_COMMUNITY): Payer: Medicare FFS | Admitting: Physical Therapy

## 2018-05-02 DIAGNOSIS — M6281 Muscle weakness (generalized): Secondary | ICD-10-CM | POA: Diagnosis not present

## 2018-05-02 DIAGNOSIS — R262 Difficulty in walking, not elsewhere classified: Secondary | ICD-10-CM

## 2018-05-02 DIAGNOSIS — R2689 Other abnormalities of gait and mobility: Secondary | ICD-10-CM

## 2018-05-02 NOTE — Therapy (Signed)
[facility] [address] [place], [place], [address] Phone: [phone]   Fax:  [phone]  Physical Therapy Treatment  Patient Details  Name: [name] MRN: [id number] Date of Birth: 09-Jun-1935 Referring Provider: Dina Rich, MD   Encounter Date: 05/02/2018  PT End of Session - 05/02/18 1139    Visit Number  4    Number of Visits  9    Date for PT Re-Evaluation  05/10/18    Authorization Type  [employer]    Authorization Time Period  04/12/18 to 05/10/18    Authorization - Visit Number  4    Authorization - Number of Visits  10    PT Start Time  1120    PT Stop Time  1200    PT Time Calculation (min)  40 min    Equipment Utilized During Treatment  Gait belt    Activity Tolerance  Patient limited by fatigue;Patient tolerated treatment well    Behavior During Therapy  [facility] for tasks assessed/performed       Past Medical History:  Diagnosis Date  . Alzheimer's dementia   . Chronic systolic CHF (congestive heart failure) (HCC)    a. 04/2012 EF 25-30%, viable myocardium by rest thallium redistribution   . Coronary artery disease    a. 2009 CABG x 3(LIMA->LAD, VG->RAMUS, VG->OM;  b. 04/2012 Cath/PCI: LM nl, LAD 50-70p,59m, RI 147m, LCX occluded OM's, RCA 9m, 30-40d, PDA 70p, VG->RI nl, VG->OM nl, LIMA->LAD nl, EF 25-30%. RCA stented w/ 2.25x28 & 2.5x25mm Xpedition DES'.   . Hyperlipidemia   . Hypertension   . Ischemic cardiomyopathy   . Polymorphic ventricular tachycardia (HCC)   . S/P ICD (internal cardiac defibrillator) procedure, with upgrade to ICD/CRT-D  Medtronic 11/06/2014  . Type II diabetes mellitus (HCC)     Past Surgical History:  Procedure Laterality Date  . BI-VENTRICULAR IMPLANTABLE CARDIOVERTER DEFIBRILLATOR UPGRADE N/A 11/04/2014   Initial ICD implanted in [place] by Dr Levonne Spiller.  Upgraded to MDT BiV ICD by Dr Graciela Husbands for bradycardia dependant PMVT  . CARDIAC CATHETERIZATION  04/07/2012  . CORONARY ARTERY  BYPASS GRAFT  ~ 2009   CABG X1  . goiter removed    . LEFT AND RIGHT HEART CATHETERIZATION WITH CORONARY/GRAFT ANGIOGRAM N/A 04/07/2012   Procedure: LEFT AND RIGHT HEART CATHETERIZATION WITH Isabel Caprice;  Surgeon: Vesta Mixer, MD;  Location: [facility] CATH LAB;  Service: Cardiovascular;  Laterality: N/A;  . PERCUTANEOUS CORONARY STENT INTERVENTION (PCI-S) N/A 04/11/2012   Procedure: PERCUTANEOUS CORONARY STENT INTERVENTION (PCI-S);  Surgeon: Herby Abraham, MD;  Location: [facility] CATH LAB;  Service: Cardiovascular;  Laterality: N/A;    There were no vitals filed for this visit.  Subjective Assessment - 05/02/18 1134    Subjective  Pt reports nothing is bothering her today.  States she has been doing exercises at home but unable to recall or demonstrate any of her therex. States she has been walking around her house holding on to someone because she lost her walker.     Currently in Pain?  No/denies                       [facility] Adult PT Treatment/Exercise - 05/02/18 0001      Bed Mobility   Bed Mobility  Rolling Left;Left Sidelying to Sit;Sit to Sidelying Left    Rolling Left  Supervision/Verbal cueing    Left Sidelying to Sit  Minimal Assistance - Patient >75%    Sit  to Sidelying Left  Contact Guard/Touching assist      Transfers   Transfers  Sit to Stand;Stand to Sit    Sit to Stand  2: Max assist;3: Mod assist;With upper extremity assist;Multiple attempts;Uncontrolled descent    Sit to Stand Details  Tactile cues for initiation;Tactile cues for sequencing;Verbal cues for sequencing;Verbal cues for technique;Verbal cues for precautions/safety;Manual facilitation for weight shifting    Sit to Stand Details (indicate cue type and reason)  max cueing for proper hand placement, forward weight shifting, control and safety during STS w/ RW    Stand to Sit  2: Max assist    Stand to Sit Details (indicate cue type and reason)  Verbal cues for precautions/safety    Stand to  Sit Details  cues for safe stand to sit    Comments  stand pivot bed to chair with mod-max A.        Lumbar Exercises: Supine   Clam  15 reps;3 seconds      Knee/Hip Exercises: Seated   Long Arc Quad  Strengthening;15 reps      Knee/Hip Exercises: Supine   Bridges  10 reps;2 sets    Bridges Limitations  3 sec hold    Straight Leg Raises  Both;10 reps             PT Education - 05/02/18 1200    Education Details  informed her re-evaluation was coming up and the need to show progress to continue therapy.  Urged to do more at home, complete therex and push self.    [name](s) Educated  Patient;Spouse    Methods  Explanation    Comprehension  Verbalized understanding       PT Short Term Goals - 04/12/18 1215      PT SHORT TERM GOAL #1   Title  Pt and caregiver will be independent with HEP and perform consistently in order to improve pt's strength.    Time  2    Period  Weeks    Status  New    Target Date  04/26/18      PT SHORT TERM GOAL #2   Title  Pt will be able to stand for 10 sec with RW and mod assist for balance in order to demo improved functional strength and to improve transfers at home.    Time  2    Period  Weeks    Status  New        PT Long Term Goals - 04/12/18 1215      PT LONG TERM GOAL #1   Title  Pt will have 1/2 grade improvement in MMT in order to improve her transfers and functional mobility at home with less assistance from caregiver.    Time  4    Period  Weeks    Status  New    Target Date  05/10/18      PT LONG TERM GOAL #2   Title  Pt will be able to perform STS transfers and SPT chair <> bed (level surface) with moderate assistance and minimal cues for proper technique in order to improve her function at home and decrease stress on caregiver.    Time  4    Period  Weeks    Status  New      PT LONG TERM GOAL #3   Title  Pt will be able to stand for 20 sec or > with RW and min A for balance or better to further demo  improved  functional strength and decrease need for assistance with transfers at home.    Time  4    Period  Weeks    Status  New      PT LONG TERM GOAL #4   Title  Pt will be able to ambulate 8510ft with RW and mod A for balance or better in order to demo improved functional strength and maximize her function at home.     Time  4    Period  Weeks    Status  New            Plan - 05/02/18 1201    Clinical Impression Statement  Continues to show poor motivation and states completing activity at home, however cannot stand without max assist.  Pt exhibinting extension posture with transfers and takes extended time to complete activites due to refocusing and staying on task.  Discussed with patient and spouse regarding need to show progress to continue therapy.  Increased reps today with mat activity.  Attmepted standing with walker again, however unsuccessful.      Rehab Potential  Fair    PT Frequency  2x / week    PT Duration  4 weeks    PT Treatment/Interventions  ADLs/Self Care Home Management;Aquatic Therapy;Cryotherapy;Electrical Stimulation;Moist Heat;Ultrasound;DME Instruction;Gait training;Functional mobility training;Therapeutic activities;Therapeutic exercise;Balance training;Neuromuscular re-education;Patient/family education;Wheelchair mobility training;Manual techniques;Passive range of motion;Taping    PT Next Visit Plan  review HEP and ensure carry over; continue mat level strengthening activities, continue to perform STS for strength/safety, bed mobility, attempt gait with w/c follow when able.      PT Home Exercise Plan  eval: seated heel and toe, seated march, LAQ, seated glute set; 04/18/18 - supine bridge    Consulted and Agree with Plan of Care  Patient;Family member/caregiver    Family Member Consulted  husband       Patient will benefit from skilled therapeutic intervention in order to improve the following deficits and impairments:  Decreased activity tolerance, Decreased  balance, Decreased endurance, Decreased mobility, Decreased range of motion, Decreased strength, Difficulty walking, Hypomobility, Increased muscle spasms, Impaired flexibility, Impaired UE functional use, Improper body mechanics, Postural dysfunction, Pain  Visit Diagnosis: Muscle weakness (generalized)  Difficulty in walking, not elsewhere classified  Other abnormalities of gait and mobility     Problem List Patient Active Problem List   Diagnosis Date Noted  . Polymorphic ventricular tachycardia (HCC) 08/15/2015  . S/P ICD (internal cardiac defibrillator) procedure, with upgrade to ICD/CRT-D  Medtronic 11/06/2014  . Cardiomyopathy, ischemic EF 25-30% 11/06/2014  . Weakness 11/06/2014  . AICD (automatic cardioverter/defibrillator) present   . Torsades de pointes with 3 shocks, another pace terminated 10/31/2014  . Mobitz type 2 second degree atrioventricular block 10/31/2014  . Complete heart block (HCC) 10/31/2014  . CAD (coronary artery disease) 04/13/2012  . Acute systolic CHF (congestive heart failure) (HCC) 04/12/2012  . Hypertension   . High cholesterol   . Type II diabetes mellitus [facility](HCC)    Lurena Nidamy B Neilah Fulwider, PTA/CLT [phone]  Lurena NidaFrazier, Shelley Cocke B 05/02/2018, 12:03 PM  [facility] [facility] [address] [place], [place], [address] Phone: [phone]   Fax:  [phone]  Name: Kelly PersonDella [name] MRN: [id number] Date of Birth: 05-26-1935

## 2018-05-04 ENCOUNTER — Ambulatory Visit (HOSPITAL_COMMUNITY): Payer: Medicare FFS

## 2018-05-04 NOTE — Telephone Encounter (Signed)
No show, called and left message concerning missed apt today.  Included next apt and time with contact information given.  Tried to call daughter about missed apt, unable to get through with phone number given.    [address] LPTA; CBIS [phone]

## 2018-05-08 ENCOUNTER — Telehealth (HOSPITAL_COMMUNITY): Payer: Self-pay

## 2018-05-08 ENCOUNTER — Telehealth (HOSPITAL_COMMUNITY): Payer: Self-pay | Admitting: Internal Medicine

## 2018-05-08 ENCOUNTER — Ambulatory Visit (HOSPITAL_COMMUNITY): Payer: Medicare FFS

## 2018-05-08 NOTE — Telephone Encounter (Signed)
05/08/18  husband called to cx he thought her appt was on Tuesday.  I rescehduled for 10/7 and told him I would add to the wait list for this week and he said ok

## 2018-05-08 NOTE — Telephone Encounter (Signed)
No show; pt's husband stating that he thought her appointment was Tuesday. He was transferred to the front office to reschedule this appointment.  Jac Canavan PT, DPT

## 2018-05-12 ENCOUNTER — Encounter

## 2018-05-15 ENCOUNTER — Ambulatory Visit (HOSPITAL_COMMUNITY): Payer: Medicare FFS

## 2018-05-15 ENCOUNTER — Telehealth (HOSPITAL_COMMUNITY): Payer: Self-pay

## 2018-05-15 ENCOUNTER — Encounter (HOSPITAL_COMMUNITY): Payer: Self-pay

## 2018-05-15 NOTE — Therapy (Signed)
[place], [place], [address] Phone: [phone]   Fax:  [phone]  Patient Details  Name: [name] MRN: [id number] Date of Birth: September 18, 1934 Referring Provider:  No ref. provider found  Encounter Date: 05/15/2018   PHYSICAL THERAPY DISCHARGE SUMMARY  Visits from Start of Care: 4  Current functional level related to goals / functional outcomes: See last treatment note   Remaining deficits: See last treatment note   Education / Equipment: n/a  Plan: Patient agrees to discharge.  Patient goals were not met. Patient is being discharged due to the patient's request.  ?????     Geraldine Solar PT, [place] [address] [place], [place], [address] Phone: [phone]   Fax:  [phone]

## 2018-05-15 NOTE — Telephone Encounter (Signed)
No show #2; PT called and spoke to pt's husband regarding Mrs. Flett's no show. He stated the pt told him that the last therapist she worked with was mean and that she wasn't coming back. This PT apologized for the miscommunication and said that if they wanted to return they could do so with a referral and then the husband hung up the phone.     Jac Canavan PT, DPT

## 2018-05-22 ENCOUNTER — Telehealth: Payer: Self-pay | Admitting: *Deleted

## 2018-05-22 ENCOUNTER — Other Ambulatory Visit: Payer: Self-pay | Admitting: *Deleted

## 2018-05-22 DIAGNOSIS — R2681 Unsteadiness on feet: Secondary | ICD-10-CM

## 2018-05-22 NOTE — Telephone Encounter (Signed)
Pt daughter wanting referral for home health to come out for PT for the gait instability says her father has been having a hard time getting her to rehab appts at [facility] says pt has trouble getting in and out of the car - will forward to provider if ok to place home health orders

## 2018-05-23 NOTE — Telephone Encounter (Signed)
Ok to place the referral  Dominga Ferry MD

## 2018-05-23 NOTE — Telephone Encounter (Signed)
Referral place and will forward to schedulers

## 2018-05-26 ENCOUNTER — Telehealth: Payer: Self-pay | Admitting: *Deleted

## 2018-05-26 MED ORDER — POTASSIUM CHLORIDE CRYS ER 20 MEQ PO TBCR: 20.0000 meq | EXTENDED_RELEASE_TABLET | Freq: Every day | ORAL | 0 refills | Status: DC

## 2018-05-26 MED ORDER — HYDRALAZINE HCL 50 MG PO TABS: 50.0000 mg | ORAL_TABLET | Freq: Three times a day (TID) | ORAL | 1 refills | Status: DC

## 2018-05-26 MED ORDER — NITROGLYCERIN 0.4 MG/SPRAY TL SOLN: 1.0000 | 0 refills | Status: DC | PRN

## 2018-05-26 MED ORDER — NITROGLYCERIN 0.4 MG/SPRAY TL SOLN: 1.0000 | 0 refills | Status: AC | PRN

## 2018-05-26 MED ORDER — ATORVASTATIN CALCIUM 80 MG PO TABS: 80.0000 mg | ORAL_TABLET | Freq: Every day | ORAL | 0 refills | Status: DC

## 2018-05-26 MED ORDER — SPIRONOLACTONE 25 MG PO TABS: 12.5000 mg | ORAL_TABLET | Freq: Every day | ORAL | 1 refills | Status: AC

## 2018-05-26 MED ORDER — MAGNESIUM OXIDE 400 MG PO TABS: 1.0000 | ORAL_TABLET | Freq: Every day | ORAL | 1 refills | Status: AC

## 2018-05-26 MED ORDER — FERROUS SULFATE 325 (65 FE) MG PO TABS: ORAL_TABLET | ORAL | 0 refills | Status: DC

## 2018-05-26 NOTE — Telephone Encounter (Signed)
Received call from home health nurse with notification that services started on yesterday and with request for new orders for patient. Nurse advised that several medications listed on medication list were not in the home. Refills sent for the missing medications to patient's pharmacy.  Home health nurse is requesting orders for: Weight parameters for heart failure Glucometer order for diabetes Home Health Aide to assist with ADL's  Please advise if orders can be approved.

## 2018-05-29 ENCOUNTER — Other Ambulatory Visit: Payer: Self-pay | Admitting: Cardiovascular Disease

## 2018-05-30 NOTE — Telephone Encounter (Signed)
Ok to approve orders for CHF weights and ADLs. I do not manage her diabetes, from her medication list her pcp has not had her on any meds for her diabetes and thus I would order the glucometer, they can touch base with pcp   Dominga Ferry MD

## 2018-05-30 NOTE — Telephone Encounter (Signed)
RN Vilinda Boehringer informed and verbalized understanding of plan.

## 2018-05-30 NOTE — Telephone Encounter (Signed)
Ok to approve orders for CHF weights and ADLs. I do not manage her diabetes, from her medication list her pcp has not had her on any meds for her diabetes and thus I would order the glucometer, they can touch base with pcp   J Travonta Gill MD 

## 2018-05-31 ENCOUNTER — Ambulatory Visit: Payer: Medicare FFS | Admitting: Cardiology

## 2018-06-14 ENCOUNTER — Ambulatory Visit (INDEPENDENT_AMBULATORY_CARE_PROVIDER_SITE_OTHER): Payer: Medicare PPO | Admitting: Cardiology

## 2018-06-14 ENCOUNTER — Encounter: Payer: Self-pay | Admitting: Cardiology

## 2018-06-14 VITALS — BP 146/71 | HR 61 | Ht 65.0 in | Wt 155.0 lb

## 2018-06-14 DIAGNOSIS — I2510 Atherosclerotic heart disease of native coronary artery without angina pectoris: Secondary | ICD-10-CM

## 2018-06-14 DIAGNOSIS — I5022 Chronic systolic (congestive) heart failure: Secondary | ICD-10-CM | POA: Diagnosis not present

## 2018-06-14 DIAGNOSIS — I4729 Other ventricular tachycardia: Secondary | ICD-10-CM

## 2018-06-14 DIAGNOSIS — I472 Ventricular tachycardia: Secondary | ICD-10-CM

## 2018-06-14 DIAGNOSIS — I10 Essential (primary) hypertension: Secondary | ICD-10-CM

## 2018-06-14 NOTE — Patient Instructions (Addendum)
Your physician recommends that you schedule a follow-up appointment in: 4 MONTHS WITH DR [facility] AND RE ESTABLISH WITH DEVICE CLINIC   Your physician recommends that you continue on your current medications as directed. Please refer to the Current Medication list given to you today.  Your physician recommends that you return for lab work - PLEASE FAST 6-8 HOURS PRIOR TO LABS - WE HAVE GIVEN YOU ORDERS - BMP/CBC/MG/TSH/LIPIDS/HGBA1C  Thank you for choosing [facility]!!

## 2018-06-14 NOTE — Progress Notes (Signed)
Clinical Summary [name] is a 83 y.o.female  seen today for follow up of the following medical problems.   1. CAD/ICM/Chronic systolic HF - hx of CABG in 2009 (LIMA-LAD, SVG-ramus, SVG-OM). Hx of RCA stent 04/2012 - she reports a later recent stent placed in [place].She remains on plavix  02/2015 echo LVEF 20-25%, grade II diastolic dysfunction - she has CRT-D device followed by EP   - no recent SOB/DOE. No recent edema - compliatn with meds  2. OSA screen - + snoring, occasional apneic episodes, daytime fatigue. - she has not scheduled her appt with sleep medicine yet.We have placed several referals in the past.   3. VT - presented to [facility] 10/2014 with SOB, found to be in VT that was converted twice by her ICD. K at that time was 2.8. She was started on amio gtt and transferred to [facility] - she also developed acute pulmonary edema requiring bipap and diuresis - ICD interrogation showed 9 VF events, 1 pace terminated and 3 shock terminated episodes, 6 aborted charges. 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 EP notes also mention complete heart block with brady dependent torsades.  - she underwent ICD upgrade of single chamber ICD to [facility] CRT-D  -no recent symptoms. Does not appear she has followed up with device clinic in quite some time.    4. HTN - compliant with meds  5. Hyperlipidemia - 09/2016 we increased atorva to 80mg  daily.  - no recent labs.    Past Medical History:  Diagnosis Date  . Alzheimer's dementia   . Chronic systolic CHF (congestive heart failure) (HCC)    a. 04/2012 EF 25-30%, viable myocardium by rest thallium redistribution   . Coronary artery disease    a. 2009 CABG x 3(LIMA->LAD, VG->RAMUS, VG->OM;  b. 04/2012 Cath/PCI: LM nl, LAD 50-70p,26m, RI 159m, LCX occluded OM's, RCA 70m, 30-40d, PDA 70p, VG->RI nl, VG->OM nl, LIMA->LAD nl, EF  25-30%. RCA stented w/ 2.25x28 & 2.5x27mm Xpedition DES'.   . Hyperlipidemia   . Hypertension   . Ischemic cardiomyopathy   . Polymorphic ventricular tachycardia (HCC)   . S/P ICD (internal cardiac defibrillator) procedure, with upgrade to ICD/CRT-D  Medtronic 11/06/2014  . Type II diabetes mellitus (HCC)      No Known Allergies   Current Outpatient Medications  Medication Sig Dispense Refill  . acetaminophen (TYLENOL) 325 MG tablet Take 1-2 tablets (325-650 mg total) by mouth every 4 (four) hours as needed for mild pain.    Marland Kitchen aspirin EC 81 MG tablet Take 81 mg by mouth daily.    Marland Kitchen atorvastatin (LIPITOR) 80 MG tablet Take 1 tablet (80 mg total) by mouth daily. 15 tablet 0  . carvedilol (COREG) 25 MG tablet TAKE 1 TABLET (25 MG TOTAL) BY MOUTH 2 (TWO) TIMES DAILY. 180 tablet 1  . clopidogrel (PLAVIX) 75 MG tablet TAKE 1 TABLET BY MOUTH EVERY DAY*NEEDS OFFICE VISIT* 7 tablet 0  . clopidogrel (PLAVIX) 75 MG tablet TAKE 1 TABLET BY MOUTH EVERY DAY 90 tablet 2  . ENTRESTO 97-103 MG TAKE 1 TAB BY MOUTH TWICE A DAY 30 tablet 0  . ENTRESTO 97-103 MG TAKE 1 TABLET BY MOUTH TWICE A DAY 60 tablet 2  . ferrous sulfate 325 (65 FE) MG tablet TAKE 1 TABLET BY MOUTH EVERY DAY *NEEDS APPT FOR MORE REFILLS* 90 tablet 0  . furosemide (LASIX) 40  MG tablet Take 1.5 tablets (60 mg total) by mouth daily. 135 tablet 1  . hydrALAZINE (APRESOLINE) 50 MG tablet Take 1 tablet (50 mg total) by mouth every 8 (eight) hours. 90 tablet 1  . levothyroxine (SYNTHROID, LEVOTHROID) 25 MCG tablet TAKE 1 TABLET BY MOUTH DAILY BEFORE BREAKFAST. 30 tablet 6  . magnesium oxide (MAG-OX) 400 MG tablet Take 1 tablet (400 mg total) by mouth daily. 90 tablet 1  . Magnesium Oxide 400 (240 Mg) MG TABS TAKE 1 TABLET BY MOUTH EVERY DAY 90 tablet 0  . meclizine (ANTIVERT) 25 MG tablet Take 25 mg by mouth 3 (three) times daily as needed for dizziness.    . memantine (NAMENDA) 10 MG tablet Take 10 mg by mouth 2 (two) times daily.  5  .  nitroGLYCERIN (NITROLINGUAL) 0.4 MG/SPRAY spray Place 1 spray under the tongue every 5 (five) minutes x 3 doses as needed for chest pain (up to 3 doses, if no relief after 3rd dose, proceed to the ED for an evaluation). 12 g 0  . potassium chloride SA (K-DUR,KLOR-CON) 20 MEQ tablet Take 1 tablet (20 mEq total) by mouth daily. 90 tablet 0  . spironolactone (ALDACTONE) 25 MG tablet Take 0.5 tablets (12.5 mg total) by mouth daily. 45 tablet 1   No current facility-administered medications for this visit.      Past Surgical History:  Procedure Laterality Date  . BI-VENTRICULAR IMPLANTABLE CARDIOVERTER DEFIBRILLATOR UPGRADE N/A 11/04/2014   Initial ICD implanted in [place] by Dr Levonne Spiller.  Upgraded to MDT BiV ICD by Dr Graciela Husbands for bradycardia dependant PMVT  . CARDIAC CATHETERIZATION  04/07/2012  . CORONARY ARTERY BYPASS GRAFT  ~ 2009   CABG X1  . goiter removed    . LEFT AND RIGHT HEART CATHETERIZATION WITH CORONARY/GRAFT ANGIOGRAM N/A 04/07/2012   Procedure: LEFT AND RIGHT HEART CATHETERIZATION WITH Isabel Caprice;  Surgeon: Vesta Mixer, MD;  Location: [facility] CATH LAB;  Service: Cardiovascular;  Laterality: N/A;  . PERCUTANEOUS CORONARY STENT INTERVENTION (PCI-S) N/A 04/11/2012   Procedure: PERCUTANEOUS CORONARY STENT INTERVENTION (PCI-S);  Surgeon: Herby Abraham, MD;  Location: [facility] CATH LAB;  Service: Cardiovascular;  Laterality: N/A;     No Known Allergies    No family history on file.   Social History Ms. Losey reports that she has never smoked. She has never used smokeless tobacco. Ms. Estock reports that she does not drink alcohol.   Review of Systems CONSTITUTIONAL: No weight loss, fever, chills, weakness or fatigue.  HEENT: Eyes: No visual loss, blurred vision, double vision or yellow sclerae.No hearing loss, sneezing, congestion, runny nose or sore throat.  SKIN: No rash or itching.  CARDIOVASCULAR: per hpi RESPIRATORY: No shortness of breath, cough or sputum.    GASTROINTESTINAL: No anorexia, nausea, vomiting or diarrhea. No abdominal pain or blood.  GENITOURINARY: No burning on urination, no polyuria NEUROLOGICAL: No headache, dizziness, syncope, paralysis, ataxia, numbness or tingling in the extremities. No change in bowel or bladder control.  MUSCULOSKELETAL: No muscle, back pain, joint pain or stiffness.  LYMPHATICS: No enlarged nodes. No history of splenectomy.  PSYCHIATRIC: No history of depression or anxiety.  ENDOCRINOLOGIC: No reports of sweating, cold or heat intolerance. No polyuria or polydipsia.  Marland Kitchen   Physical Examination Vitals:   06/14/18 1515  BP: (!) 146/71  Pulse: 61  SpO2: 99%   Vitals:   06/14/18 1515  Weight: 155 lb (70.3 kg)  Height: 5\' 5"  (1.651 m)    Gen: resting comfortably, no acute  distress HEENT: no scleral icterus, pupils equal round and reactive, no palptable cervical adenopathy,  CV: RRR, n m/r/g, no jvd Resp: Clear to auscultation bilaterally GI: abdomen is soft, non-tender, non-distended, normal bowel sounds, no hepatosplenomegaly MSK: extremities are warm, no edema.  Skin: warm, no rash Neuro:  no focal deficits Psych: appropriate affect   Diagnostic Studies 04/2012 Cath Hemodynamics:   RA:4/4 RV:30/3 PCWP:9/4 PA:29/11  Cardiac Output  Thermodilution:3.39Index of 2.1 Fick :3.78Index 1.88  Arterial Sat:94% PA Sat:60%.  LV pressure:151/8 Aortic pressure:152/67  Angiography   Left Main:smooth and normal  Left anterior Descending: Moderate disease in the proximal segment between 50-70%. The LAD gives off a small diagonal and then has a lucent 70% stenosis. Competitive filling can be seen coming from the IMA graft.  Ramus Intermediate : The ramus intermediate is a moderate vessel and is occluded at it's mid point.   Left Circumflex:The LCx is a small - moderate sized vessel . The OM  branches are occluded.  Right Coronary Artery: Large and dominant. The entire RCA is moderately diffusely diseased. There is a tortous 40% stenosis in the proximal vessel. The mid RCA has a tight 95% stenosis. There is brisk flow through this stenosis. The distal RCA has a 30-40% stenosis . The PDA has a 70% stenosis in the proximal aspect of the Suvan Stcyr. The PLSA has no significant disease.  SVG to Ramus : The more superior graft is assumed to be to the Ramus Zyad Boomer. Nice graft. No stenosis in the graft or native vessel ( assumed to be Ramus)  SVG to Obtuse Marginal :( the inferior graft) Normal graft. Normal native vessel.  LIMA to LAD: Normal graft. The anastomosis is normal. There is competitive flow from the native LAD.  LV Gram:done with hand injection of 7 cc contrast. Severe LV dysfunction. EF 25-30%  Complications: No apparent complications Patient did tolerate procedure well.  Contrast used:92 cc   Conclusions:  1. Severe native CAD . She has patent grafts to the LAD, OM, and assumed ramus Safiatou Islam. The native RCA does not have any grafts and has a tight 95% stenosis in the mid segment.   The patient was not consented for PCI prior to arriving in the cath lab and she had already been given valium. We will treat with heparin overnight and plan PCI on Tuesday.   In addition, there was some concern about her renal function and limiting contrast. Staging the PCI will allow her to clear all of the contrast given and minimize risk of contrast induced nephropathy.  2. Severe LV dysfunction:She has an EF of 25-30 %. Her filling pressures are low / normal. I do not think she needs any further diuresis over what she is getting currently.     03/2012 Echo LVEF 20-25%, grade II diastolic dysfunction, mod MR,   02/2015 Echo Study Conclusions  - Left ventricle: The cavity size was normal. Wall thickness was increased in a pattern of  moderate LVH. Systolic function was severely reduced. The estimated ejection fraction was in the range of 20% to 25%. Diffuse hypokinesis. Features are consistent with a pseudonormal left ventricular filling pattern, with concomitant abnormal relaxation and increased filling pressure (grade 2 diastolic dysfunction). - Aortic valve: Mildly calcified annulus. Trileaflet; mildly thickened leaflets. Valve area (VTI): 1.73 cm^2. Valve area (Vmax): 1.73 cm^2. Valve area (Vmean): 1.92 cm^2. - Mitral valve: Mildly calcified annulus. Mildly thickened leaflets . There was moderate regurgitation. The MR vena contracta is 0.5 cm. - Left atrium:  The atrium was moderately to severely dilated. - Right ventricle: The cavity size was mildly dilated. - Right atrium: The atrium was mildly dilated. - Tricuspid valve: There was mild-moderate regurgitation. - Technically adequate study.  02/2015 Carotid US 1-39% bilateral ICA stenosis. Left subclavian stenosis in setting of LIMA graft.        Assessment and Plan  1. CAD/ICM/Chronic systolic HF - no symptoms, appears euvolemic. COntinue current meds - reestablish in device clinic for her CID  2. OSA screen -we have made several referals however she has never scheduled her appt. We will arrange she is ready.   3. VT - followed by EP - reestablish in device clinic. No recent symptoms.   3. HTN -manual recheck 120/50, she is at goal. COntinue current meds  4. Hyperlipidemia -repeat labs, continue statin   Obtain annual labs  F/u 4 months      Antoine Poche, M.D.

## 2018-06-17 ENCOUNTER — Other Ambulatory Visit: Payer: Self-pay | Admitting: Cardiology

## 2018-06-19 ENCOUNTER — Other Ambulatory Visit: Payer: Self-pay | Admitting: Cardiology

## 2018-06-21 ENCOUNTER — Telehealth: Payer: Self-pay | Admitting: Cardiology

## 2018-06-21 NOTE — Telephone Encounter (Signed)
Medications reviewed with home health nurse.

## 2018-06-21 NOTE — Telephone Encounter (Signed)
[facility] . They are wanting to verify medications.

## 2018-06-28 ENCOUNTER — Telehealth: Payer: Self-pay | Admitting: Cardiology

## 2018-06-28 NOTE — Telephone Encounter (Signed)
Would like to know if Home Health can be extended longer. Was told this week it would be ending.

## 2018-06-29 NOTE — Telephone Encounter (Signed)
Geraldine (daughter) notified.  This noAlvira Philipste will be [facility] at [phone] for notification.

## 2018-06-29 NOTE — Telephone Encounter (Signed)
Ok to extend   Avery DennisonJ Marvelous Bouwens MD

## 2018-07-04 ENCOUNTER — Telehealth: Payer: Self-pay | Admitting: *Deleted

## 2018-07-04 NOTE — Telephone Encounter (Signed)
[facility] called to report that pt no longer needs home health therapy

## 2018-08-19 ENCOUNTER — Other Ambulatory Visit: Payer: Self-pay | Admitting: Cardiology

## 2018-10-19 ENCOUNTER — Encounter: Payer: Self-pay | Admitting: Cardiology

## 2018-10-19 ENCOUNTER — Other Ambulatory Visit: Payer: Self-pay

## 2018-10-19 ENCOUNTER — Ambulatory Visit (INDEPENDENT_AMBULATORY_CARE_PROVIDER_SITE_OTHER): Payer: Medicare Other | Admitting: Cardiology

## 2018-10-19 VITALS — BP 142/70 | HR 65 | Ht 66.0 in | Wt 162.2 lb

## 2018-10-19 DIAGNOSIS — I472 Ventricular tachycardia: Secondary | ICD-10-CM

## 2018-10-19 DIAGNOSIS — I4729 Other ventricular tachycardia: Secondary | ICD-10-CM

## 2018-10-19 DIAGNOSIS — I10 Essential (primary) hypertension: Secondary | ICD-10-CM | POA: Diagnosis not present

## 2018-10-19 DIAGNOSIS — I2510 Atherosclerotic heart disease of native coronary artery without angina pectoris: Secondary | ICD-10-CM | POA: Diagnosis not present

## 2018-10-19 DIAGNOSIS — I5022 Chronic systolic (congestive) heart failure: Secondary | ICD-10-CM

## 2018-10-19 NOTE — Patient Instructions (Signed)
Your physician recommends that you schedule a follow-up appointment in: 4 MONTHS WITH DR [facility] AND APPOINTMENT WITH DEVICE CLINIC  Your physician recommends that you continue on your current medications as directed. Please refer to the Current Medication list given to you today.  Thank you for choosing [facility]!!

## 2018-10-19 NOTE — Progress Notes (Signed)
Clinical Summary [name] is a 84 y.o.female seen today for follow up of the following medical problems.   1. CAD/ICM/Chronic systolic HF - hx of CABG in 2009 (LIMA-LAD, SVG-ramus, SVG-OM). Hx of RCA stent 04/2012 - she reports alaterrecent stent placed in [place].She remains on plavix  02/2015 echo LVEF 20-25%, grade II diastolic dysfunction - she has CRT-D device followed by EP   - no recent edema, no SOB/DOE - no recent chest pain - compliant with meds  2. OSA screen - + snoring, occasional apneic episodes, daytime fatigue. - she has not scheduled her appt with sleep medicine yet.We have placed several referals in the past.   3. VT - presented to [facility] 10/2014 with SOB, found to be in VT that was converted twice by her ICD. K at that time was 2.8. She was started on amio gtt and transferred to [facility] - she also developed acute pulmonary edema requiring bipap and diuresis - ICD interrogation showed 9 VF events, 1 pace terminated and 3 shock terminated episodes, 6 aborted charges. 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 EP notes also mention complete heart block with brady dependent torsades.  - she underwent ICD upgrade of single chamber ICD to [facility] CRT-D   - no recent palpitaitons. Has not follow up in device clinic in quite some time   4. HTN -she is compliant with meds  5. Hyperlipidemia - compliant with statin, she reports labs 1 month ago with pcp   Past Medical History:  Diagnosis Date  . Alzheimer's dementia (HCC)   . Chronic systolic CHF (congestive heart failure) (HCC)    a. 04/2012 EF 25-30%, viable myocardium by rest thallium redistribution   . Coronary artery disease    a. 2009 CABG x 3(LIMA->LAD, VG->RAMUS, VG->OM;  b. 04/2012 Cath/PCI: LM nl, LAD 50-70p,33m, RI 177m, LCX occluded OM's, RCA 31m, 30-40d, PDA 70p, VG->RI nl, VG->OM nl, LIMA->LAD  nl, EF 25-30%. RCA stented w/ 2.25x28 & 2.5x71mm Xpedition DES'.   . Hyperlipidemia   . Hypertension   . Ischemic cardiomyopathy   . Polymorphic ventricular tachycardia (HCC)   . S/P ICD (internal cardiac defibrillator) procedure, with upgrade to ICD/CRT-D  Medtronic 11/06/2014  . Type II diabetes mellitus (HCC)      No Known Allergies   Current Outpatient Medications  Medication Sig Dispense Refill  . acetaminophen (TYLENOL) 325 MG tablet Take 1-2 tablets (325-650 mg total) by mouth every 4 (four) hours as needed for mild pain.    Marland Kitchen aspirin EC 81 MG tablet Take 81 mg by mouth daily.    Marland Kitchen atorvastatin (LIPITOR) 80 MG tablet TAKE 1 TABLET BY MOUTH EVERY DAY 30 tablet 6  . carvedilol (COREG) 25 MG tablet TAKE 1 TABLET (25 MG TOTAL) BY MOUTH 2 (TWO) TIMES DAILY. 180 tablet 1  . clopidogrel (PLAVIX) 75 MG tablet TAKE 1 TABLET BY MOUTH EVERY DAY 90 tablet 2  . ENTRESTO 97-103 MG TAKE 1 TABLET BY MOUTH TWICE A DAY 60 tablet 2  . ferrous sulfate 325 (65 FE) MG tablet TAKE 1 TABLET BY MOUTH EVERY DAY *NEEDS APPT FOR MORE REFILLS* 90 tablet 0  . hydrALAZINE (APRESOLINE) 50 MG tablet TAKE 1 TABLET (50 MG TOTAL) BY MOUTH EVERY 8 (EIGHT) HOURS. 90 tablet 6  . KLOR-CON M20 20 MEQ tablet TAKE 1 TABLET BY MOUTH EVERY DAY 90 tablet 0  . levothyroxine (SYNTHROID,  LEVOTHROID) 25 MCG tablet TAKE 1 TABLET BY MOUTH DAILY BEFORE BREAKFAST. 30 tablet 6  . magnesium oxide (MAG-OX) 400 MG tablet Take 1 tablet (400 mg total) by mouth daily. 90 tablet 1  . Magnesium Oxide 400 (240 Mg) MG TABS TAKE 1 TABLET BY MOUTH EVERY DAY 90 tablet 0  . meclizine (ANTIVERT) 25 MG tablet Take 25 mg by mouth 3 (three) times daily as needed for dizziness.    . memantine (NAMENDA) 10 MG tablet Take 10 mg by mouth 2 (two) times daily.  5  . nitroGLYCERIN (NITROLINGUAL) 0.4 MG/SPRAY spray Place 1 spray under the tongue every 5 (five) minutes x 3 doses as needed for chest pain (up to 3 doses, if no relief after 3rd dose, proceed to the  ED for an evaluation). 12 g 0  . spironolactone (ALDACTONE) 25 MG tablet Take 0.5 tablets (12.5 mg total) by mouth daily. 45 tablet 1  . furosemide (LASIX) 40 MG tablet Take 1.5 tablets (60 mg total) by mouth daily. 135 tablet 1   No current facility-administered medications for this visit.      Past Surgical History:  Procedure Laterality Date  . BI-VENTRICULAR IMPLANTABLE CARDIOVERTER DEFIBRILLATOR UPGRADE N/A 11/04/2014   Initial ICD implanted in [place] by Dr Levonne Spiller.  Upgraded to MDT BiV ICD by Dr Graciela Husbands for bradycardia dependant PMVT  . CARDIAC CATHETERIZATION  04/07/2012  . CORONARY ARTERY BYPASS GRAFT  ~ 2009   CABG X1  . goiter removed    . LEFT AND RIGHT HEART CATHETERIZATION WITH CORONARY/GRAFT ANGIOGRAM N/A 04/07/2012   Procedure: LEFT AND RIGHT HEART CATHETERIZATION WITH Isabel Caprice;  Surgeon: Vesta Mixer, MD;  [facility] CATH LAB;  Service: Cardiovascular;  Laterality: N/A;  . PERCUTANEOUS CORONARY STENT INTERVENTION (PCI-S) N/A 04/11/2012   Procedure: PERCUTANEOUS CORONARY STENT INTERVENTION (PCI-S);  Surgeon: Herby Abraham, MD;  Location: [facility] CATH LAB;  Service: Cardiovascular;  Laterality: N/A;     No Known Allergies    No family history on file.   Social History [name] reports that she has never smoked. She has never used smokeless tobacco. [name] reports no history of alcohol use.   Review of Systems CONSTITUTIONAL: No weight loss, fever, chills, weakness or fatigue.  HEENT: Eyes: No visual loss, blurred vision, double vision or yellow sclerae.No hearing loss, sneezing, congestion, runny nose or sore throat.  SKIN: No rash or itching.  CARDIOVASCULAR: per hpi RESPIRATORY: No shortness of breath, cough or sputum.  GASTROINTESTINAL: No anorexia, nausea, vomiting or diarrhea. No abdominal pain or blood.  GENITOURINARY: No burning on urination, no polyuria NEUROLOGICAL: No headache, dizziness, syncope, paralysis, ataxia, numbness or  tingling in the extremities. No change in bowel or bladder control.  MUSCULOSKELETAL: No muscle, back pain, joint pain or stiffness.  LYMPHATICS: No enlarged nodes. No history of splenectomy.  PSYCHIATRIC: No history of depression or anxiety.  ENDOCRINOLOGIC: No reports of sweating, cold or heat intolerance. No polyuria or polydipsia.  Marland Kitchen   Physical Examination Vitals:   10/19/18 1154  BP: (!) 142/70  Pulse: 65  SpO2: 98%   Filed Weights   10/19/18 1154  Weight: 162 lb 3.2 oz (73.6 kg)    Gen: resting comfortably, no acute distress HEENT: no scleral icterus, pupils equal round and reactive, no palptable cervical adenopathy,  CV: RRR, no m/r,g, no jvd Resp: Clear to auscultation bilaterally GI: abdomen is soft, non-tender, non-distended, normal bowel sounds, no hepatosplenomegaly MSK: extremities are warm, no edema.  Skin: warm, no  rash Neuro:  no focal deficits Psych: appropriate affect   Diagnostic Studies 04/2012 Cath Hemodynamics:   RA:4/4 RV:30/3 PCWP:9/4 PA:29/11  Cardiac Output  Thermodilution:3.39Index of 2.1 Fick :3.78Index 1.88  Arterial Sat:94% PA Sat:60%.  LV pressure:151/8 Aortic pressure:152/67  Angiography   Left Main:smooth and normal  Left anterior Descending: Moderate disease in the proximal segment between 50-70%. The LAD gives off a small diagonal and then has a lucent 70% stenosis. Competitive filling can be seen coming from the IMA graft.  Ramus Intermediate : The ramus intermediate is a moderate vessel and is occluded at it's mid point.   Left Circumflex:The LCx is a small - moderate sized vessel . The OM branches are occluded.  Right Coronary Artery: Large and dominant. The entire RCA is moderately diffusely diseased. There is a tortous 40% stenosis in the proximal vessel. The mid RCA has a tight 95% stenosis. There is brisk flow  through this stenosis. The distal RCA has a 30-40% stenosis . The PDA has a 70% stenosis in the proximal aspect of the Marlana Mckowen. The PLSA has no significant disease.  SVG to Ramus : The more superior graft is assumed to be to the Ramus Antania Hoefling. Nice graft. No stenosis in the graft or native vessel ( assumed to be Ramus)  SVG to Obtuse Marginal :( the inferior graft) Normal graft. Normal native vessel.  LIMA to LAD: Normal graft. The anastomosis is normal. There is competitive flow from the native LAD.  LV Gram:done with hand injection of 7 cc contrast. Severe LV dysfunction. EF 25-30%  Complications: No apparent complications Patient did tolerate procedure well.  Contrast used:92 cc   Conclusions:  1. Severe native CAD . She has patent grafts to the LAD, OM, and assumed ramus Dara Camargo. The native RCA does not have any grafts and has a tight 95% stenosis in the mid segment.   The patient was not consented for PCI prior to arriving in the cath lab and she had already been given valium. We will treat with heparin overnight and plan PCI on Tuesday.   In addition, there was some concern about her renal function and limiting contrast. Staging the PCI will allow her to clear all of the contrast given and minimize risk of contrast induced nephropathy.  2. Severe LV dysfunction:She has an EF of 25-30 %. Her filling pressures are low / normal. I do not think she needs any further diuresis over what she is getting currently.     03/2012 Echo LVEF 20-25%, grade II diastolic dysfunction, mod MR,   02/2015 Echo Study Conclusions  - Left ventricle: The cavity size was normal. Wall thickness was increased in a pattern of moderate LVH. Systolic function was severely reduced. The estimated ejection fraction was in the range of 20% to 25%. Diffuse hypokinesis. Features are consistent with a pseudonormal left ventricular filling pattern, with  concomitant abnormal relaxation and increased filling pressure (grade 2 diastolic dysfunction). - Aortic valve: Mildly calcified annulus. Trileaflet; mildly thickened leaflets. Valve area (VTI): 1.73 cm^2. Valve area (Vmax): 1.73 cm^2. Valve area (Vmean): 1.92 cm^2. - Mitral valve: Mildly calcified annulus. Mildly thickened leaflets . There was moderate regurgitation. The MR vena contracta is 0.5 cm. - Left atrium: The atrium was moderately to severely dilated. - Right ventricle: The cavity size was mildly dilated. - Right atrium: The atrium was mildly dilated. - Tricuspid valve: There was mild-moderate regurgitation. - Technically adequate study.  02/2015 Carotid US 1-39% bilateral ICA stenosis. Left subclavian stenosis in  setting of LIMA graft.        Assessment and Plan  1. CAD/ICM/Chronic systolic HF -no symptoms, continue current meds - we will contact device clinic to have her f/u for device check  2. OSA screen -we have made several referals however she has never scheduled her appt. We will arrange when she is ready.   3. VT - no recent symptoms, continue medical therapy. Has AICD, we will arrange f/u with device clinic  3. HTN -at goal, manual recheck 125/65, continue current meds  4. Hyperlipidemia - she will continue statin, request labs from pcp  F/u 4 months     Antoine Poche, M.D.

## 2018-10-26 ENCOUNTER — Other Ambulatory Visit: Payer: Self-pay | Admitting: *Deleted

## 2018-10-26 MED ORDER — SACUBITRIL-VALSARTAN 97-103 MG PO TABS: 1.0000 | ORAL_TABLET | Freq: Two times a day (BID) | ORAL | 6 refills | Status: AC

## 2018-11-22 ENCOUNTER — Telehealth: Payer: Self-pay | Admitting: *Deleted

## 2018-11-22 NOTE — Telephone Encounter (Signed)
Attempted to reach pt to reschedule ICD check, or to assist with Carelink monitor setup (as pt no-showed for 11/06/18 appointment). Called number listed as belonging to [place], pt's spouse. Individual (female-sounding voice) answered the phone, said pt was sitting right there. Phone was then picked-up by a female-sounding voice who asked why I needed the pt. Attempted to explain that I am calling from Dr. Verna Czech office and why I am calling. Offered to assist with Carelink monitor setup as our records indicate pt has a monitor. Phone call was then disconnected.   Called back and LMOVM requesting call back to DC.

## 2018-11-24 NOTE — Telephone Encounter (Signed)
This is the second attempt to try and contact the pt to reschedule her ICD check and to help set up her Carelink monitor. I spoke with the pt daughter and she gave me the number to her father [name] [phone]. I left a detailed message for him to call my direct office number to discuss rescheduling her ICD appointment and to set up her Carelink home monitor.

## 2018-11-27 NOTE — Telephone Encounter (Signed)
Spoke w/ pt husband about sending a manual transmission /w pt home monitor. He stated that he would have to wait until his son got there at  [facility]. I informed pt husband that I would check in the morning to see if we received the transmission and if the son needed help he can call us back Monday through Friday 8-5.

## 2018-12-01 NOTE — Telephone Encounter (Signed)
LMOVM for pt to return call. Transmission still needed.

## 2018-12-04 ENCOUNTER — Other Ambulatory Visit: Payer: Self-pay | Admitting: Cardiology

## 2018-12-06 NOTE — Telephone Encounter (Signed)
This is the 5th attempt to reach the pt to get a transmission from the pt. I am sending a letter today. In two weeks if I do not receive a transmission or response I will send a certified letter. (12/20/2018)

## 2018-12-20 NOTE — Telephone Encounter (Signed)
Certified Letter sent 12/20/2018 

## 2019-01-10 ENCOUNTER — Telehealth: Payer: Self-pay | Admitting: Cardiology

## 2019-01-10 NOTE — Telephone Encounter (Signed)
Attempted to call pt husband back. No answer and unable to leave voicemail b/c it is full.

## 2019-01-10 NOTE — Telephone Encounter (Signed)
Please give pt's husband Sherian Maroon a call @ [phone] concerning pt's device check.

## 2019-01-11 NOTE — Telephone Encounter (Signed)
2nd attempt.   Attempted to call pt husband back. No answer and unable to leave message.

## 2019-01-12 NOTE — Telephone Encounter (Signed)
Spoke w/ pt husband he wants to reschedule missed Device clinic appt. Informed him a scheduler will call to schedule this. Pt husband verbalized understanding.

## 2019-04-13 ENCOUNTER — Other Ambulatory Visit: Payer: Self-pay

## 2019-04-13 ENCOUNTER — Ambulatory Visit (INDEPENDENT_AMBULATORY_CARE_PROVIDER_SITE_OTHER): Payer: Medicare Other | Admitting: Internal Medicine

## 2019-04-13 ENCOUNTER — Encounter: Payer: Self-pay | Admitting: Internal Medicine

## 2019-04-13 VITALS — BP 127/70 | HR 75 | Ht 65.0 in

## 2019-04-13 DIAGNOSIS — I519 Heart disease, unspecified: Secondary | ICD-10-CM

## 2019-04-13 DIAGNOSIS — Z23 Encounter for immunization: Secondary | ICD-10-CM | POA: Diagnosis not present

## 2019-04-13 DIAGNOSIS — I472 Ventricular tachycardia: Secondary | ICD-10-CM

## 2019-04-13 DIAGNOSIS — I4729 Other ventricular tachycardia: Secondary | ICD-10-CM

## 2019-04-13 DIAGNOSIS — I10 Essential (primary) hypertension: Secondary | ICD-10-CM | POA: Diagnosis not present

## 2019-04-13 LAB — CUP PACEART INCLINIC DEVICE CHECK
Date Time Interrogation Session: 20200904124703
Implantable Lead Implant Date: 20160328
Implantable Lead Implant Date: 20160328
Implantable Lead Implant Date: 20160328
Implantable Lead Location: 753858
Implantable Lead Location: 753859
Implantable Lead Location: 753860
Implantable Lead Model: 4598
Implantable Lead Model: 5076
Implantable Pulse Generator Implant Date: 20160328

## 2019-04-13 NOTE — Patient Instructions (Signed)
Medication Instructions:  Continue all current medications.  Labwork: none  Testing/Procedures: none  Follow-Up: 1 year - Dr.  Allred   Any Other Special Instructions Will Be Listed Below (If Applicable).   If you need a refill on your cardiac medications before your next appointment, please call your pharmacy.  

## 2019-04-13 NOTE — Progress Notes (Signed)
PCP: [name], MD Primary Cardiologist: Dr Harl Bowie Primary EP: Dr Rayann Heman  CC: CHF  [name] is a 84 y.o. female who presents today for routine electrophysiology followup.  Since last being seen in our clinic, the patient reports doing reasonably well. She is frail and fragile.  Not very active.  Today, she denies symptoms of palpitations, chest pain, shortness of breath,  lower extremity edema, dizziness, presyncope, syncope, or ICD shocks.  The patient is otherwise without complaint today.   Past Medical History:  Diagnosis Date  . Alzheimer's dementia ([place])   . Chronic systolic CHF (congestive heart failure) ([place])    a. 04/2012 EF 25-30%, viable myocardium by rest thallium redistribution   . Coronary artery disease    a. 2009 CABG x 3(LIMA->LAD, VG->RAMUS, VG->OM;  b. 04/2012 Cath/PCI: LM nl, LAD 50-70p,65m, RI 162m, LCX occluded OM's, RCA 68m, 30-40d, PDA 70p, VG->RI nl, VG->OM nl, LIMA->LAD nl, EF 25-30%. RCA stented w/ 2.25x28 & 2.5x55mm Xpedition DES'.   . Hyperlipidemia   . Hypertension   . Ischemic cardiomyopathy   . Polymorphic ventricular tachycardia ([place])   . S/P ICD (internal cardiac defibrillator) procedure, with upgrade to ICD/CRT-D  Medtronic 11/06/2014  . Type II diabetes mellitus ([place])    Past Surgical History:  Procedure Laterality Date  . BI-VENTRICULAR IMPLANTABLE CARDIOVERTER DEFIBRILLATOR UPGRADE N/A 11/04/2014   Initial ICD implanted in [place] by Dr Tana Coast.  Upgraded to MDT BiV ICD by Dr Caryl Comes for bradycardia dependant PMVT  . CARDIAC CATHETERIZATION  04/07/2012  . CORONARY ARTERY BYPASS GRAFT  ~ 2009   CABG X1  . goiter removed    . LEFT AND RIGHT HEART CATHETERIZATION WITH CORONARY/GRAFT ANGIOGRAM N/A 04/07/2012   Procedure: LEFT AND RIGHT HEART CATHETERIZATION WITH Beatrix Fetters;  Surgeon: Thayer Headings, MD;  Location[name] CATH LAB;  Service: Cardiovascular;  Laterality: N/A;  . PERCUTANEOUS CORONARY STENT INTERVENTION (PCI-S) N/A 04/11/2012    Procedure: PERCUTANEOUS CORONARY STENT INTERVENTION (PCI-S);  Surgeon: Hillary Bow, MD;  Location: [facility] CATH LAB;  Service: Cardiovascular;  Laterality: N/A;    ROS- all systems are reviewed and negative except as per HPI above  Current Outpatient Medications  Medication Sig Dispense Refill  . acetaminophen (TYLENOL) 325 MG tablet Take 1-2 tablets (325-650 mg total) by mouth every 4 (four) hours as needed for mild pain.    Marland Kitchen aspirin EC 81 MG tablet Take 81 mg by mouth daily.    Marland Kitchen atorvastatin (LIPITOR) 80 MG tablet TAKE 1 TABLET BY MOUTH EVERY DAY 30 tablet 6  . carvedilol (COREG) 25 MG tablet TAKE 1 TABLET (25 MG TOTAL) BY MOUTH 2 (TWO) TIMES DAILY. 180 tablet 1  . clopidogrel (PLAVIX) 75 MG tablet TAKE 1 TABLET BY MOUTH EVERY DAY 90 tablet 2  . furosemide (LASIX) 40 MG tablet Take 1.5 tablets (60 mg total) by mouth daily. 135 tablet 1  . hydrALAZINE (APRESOLINE) 50 MG tablet TAKE 1 TABLET (50 MG TOTAL) BY MOUTH EVERY 8 (EIGHT) HOURS. 90 tablet 6  . KLOR-CON M20 20 MEQ tablet TAKE 1 TABLET BY MOUTH EVERY DAY 90 tablet 0  . levothyroxine (SYNTHROID, LEVOTHROID) 25 MCG tablet TAKE 1 TABLET BY MOUTH DAILY BEFORE BREAKFAST. 30 tablet 6  . magnesium oxide (MAG-OX) 400 MG tablet Take 1 tablet (400 mg total) by mouth daily. 90 tablet 1  . meclizine (ANTIVERT) 25 MG tablet Take 25 mg by mouth 3 (three) times daily as needed for dizziness.    . memantine (NAMENDA) 10  MG tablet Take 10 mg by mouth 2 (two) times daily.  5  . nitroGLYCERIN (NITROLINGUAL) 0.4 MG/SPRAY spray Place 1 spray under the tongue every 5 (five) minutes x 3 doses as needed for chest pain (up to 3 doses, if no relief after 3rd dose, proceed to the ED for an evaluation). 12 g 0  . sacubitril-valsartan (ENTRESTO) 97-103 MG Take 1 tablet by mouth 2 (two) times daily. 60 tablet 6  . spironolactone (ALDACTONE) 25 MG tablet Take 0.5 tablets (12.5 mg total) by mouth daily. 45 tablet 1   No current facility-administered medications for  this visit.     Physical Exam: Vitals:   04/13/19 1238  BP: 127/70  Pulse: 75  SpO2: 98%  Height: 5\' 5"  (1.651 m)    GEN- The patient is elderly and frail appearing, alert and oriented x 3 today.   Head- normocephalic, atraumatic Eyes-  Sclera clear, conjunctiva pink Ears- hearing intact Oropharynx- clear Lungs-   normal work of breathing Chest- ICD pocket is well healed Heart- Regular rate and rhythm, no murmurs, rubs or gallops, PMI not laterally displaced GI- soft, NT, ND, + BS Extremities- no clubbing, cyanosis, or edema  ICD interrogation- reviewed in detail today,  See PACEART report     Wt Readings from Last 3 Encounters:  10/19/18 162 lb 3.2 oz (73.6 kg)  06/14/18 155 lb (70.3 kg)  08/13/16 165 lb (74.8 kg)    Assessment and Plan:  1.  Chronic systolic dysfunction/ ischemic CM/ CAD euvolemic today No ischemic symptoms Stable on an appropriate medical regimen Normal ICD function See [facility] report No changes today she is device dependant today not followed in ICM device clinic  2. HTN Stable No change required today  3. VT Well controlled No VT in several years (since CRT)  Remotes are encouraged.  She has not been compliant.  We will send a new monitor to her. Return to see me in a year Overdue for Dr Jolene SchimkeBranch  Anuar Walgren MD, [facility] HospitalFACC 04/13/2019 12:48 PM

## 2019-05-23 ENCOUNTER — Telehealth: Payer: Self-pay | Admitting: Cardiology

## 2019-05-23 NOTE — Telephone Encounter (Signed)
Daughter requesting a letter with diagnosis so that she can try to get personal care service. Daughter said the diagnosis would determine how much patient would have to pay out of pocket. Advised that there is no DPR on file giving [place] permission to speak with her. Spoke with patient and advised that patient's AVS would be mailed to home address that would include her cardiac diagnosis. Verbalized understanding.

## 2019-05-23 NOTE — Telephone Encounter (Signed)
Asking for letter stating patient's diagnosis to get her help with assistance at home

## 2019-06-08 ENCOUNTER — Telehealth: Payer: Self-pay | Admitting: Cardiology

## 2019-06-08 NOTE — Telephone Encounter (Signed)
Virtual Visit Pre-Appointment Phone Call  "(Name), I am calling you today to discuss your upcoming appointment. We are currently trying to limit exposure to the virus that causes COVID-19 by seeing patients at home rather than in the office."  1. "What is the BEST phone number to call the day of the visit?" - include this in appointment notes  2. Do you have or have access to (through a family member/friend) a smartphone with video capability that we can use for your visit?" a. If yes - list this number in appt notes as cell (if different from BEST phone #) and list the appointment type as a VIDEO visit in appointment notes b. If no - list the appointment type as a PHONE visit in appointment notes  3. Confirm consent - "In the setting of the current Covid19 crisis, you are scheduled for a (phone or video) visit with your provider on (date) at (time).  Just as we do with many in-office visits, in order for you to participate in this visit, we must obtain consent.  If you'd like, I can send this to your mychart (if signed up) or email for you to review.  Otherwise, I can obtain your verbal consent now.  All virtual visits are billed to your insurance company just like a normal visit would be.  By agreeing to a virtual visit, we'd like you to understand that the technology does not allow for your provider to perform an examination, and thus may limit your provider's ability to fully assess your condition. If your provider identifies any concerns that need to be evaluated in person, we will make arrangements to do so.  Finally, though the technology is pretty good, we cannot assure that it will always work on either your or our end, and in the setting of a video visit, we may have to convert it to a phone-only visit.  In either situation, we cannot ensure that we have a secure connection.  Are you willing to proceed?" STAFF: Did the patient verbally acknowledge consent to telehealth visit? Document  YES/NO here: yes  4. Advise patient to be prepared - "Two hours prior to your appointment, go ahead and check your blood pressure, pulse, oxygen saturation, and your weight (if you have the equipment to check those) and write them all down. When your visit starts, your provider will ask you for this information. If you have an Apple Watch or Kardia device, please plan to have heart rate information ready on the day of your appointment. Please have a pen and paper handy nearby the day of the visit as well."  5. Give patient instructions for MyChart download to smartphone OR Doximity/Doxy.me as below if video visit (depending on what platform provider is using)  6. Inform patient they will receive a phone call 15 minutes prior to their appointment time (may be from unknown caller ID) so they should be prepared to answer    TELEPHONE CALL NOTE  Brigitt Mcclish has been deemed a candidate for a follow-up tele-health visit to limit community exposure during the Covid-19 pandemic. I spoke with the patient via phone to ensure availability of phone/video source, confirm preferred email & phone number, and discuss instructions and expectations.  I reminded [name] to be prepared with any vital sign and/or heart rhythm information that could potentially be obtained via home monitoring, at the time of her visit. I reminded Lekeshia Kram to expect a phone call prior to her visit.  Geraldine Contras 06/08/2019 2:00 PM   INSTRUCTIONS FOR DOWNLOADING THE MYCHART APP TO SMARTPHONE  - The patient must first make sure to have activated MyChart and know their login information - If Apple, go to [employer] and type in MyChart in the search bar and download the app. If Android, ask patient to go to [employer] and type in [place] in the search bar and download the app. The app is free but as with any other app downloads, their phone may require them to verify saved payment information or Apple/Android  password.  - The patient will need to then log into the app with their MyChart username and password, and select [facility] as their healthcare provider to link the account. When it is time for your visit, go to the MyChart app, find appointments, and click Begin Video Visit. Be sure to Select Allow for your device to access the Microphone and Camera for your visit. You will then be connected, and your provider will be with you shortly.  **If they have any issues connecting, or need assistance please contact MyChart service desk [phone])**  **If using a computer, in order to ensure the best quality for their visit they will need to use either of the following Internet Browsers: [employer] or Google Chrome**  IF USING DOXIMITY or DOXY.ME - The patient will receive a link just prior to their visit by text.     FULL LENGTH CONSENT FOR TELE-HEALTH VISIT   I hereby voluntarily request, consent and authorize [facility] and its employed or contracted physicians, physician assistants, nurse practitioners or other licensed health care professionals (the Practitioner), to provide me with telemedicine health care services (the Services") as deemed necessary by the treating Practitioner. I acknowledge and consent to receive the Services by the Practitioner via telemedicine. I understand that the telemedicine visit will involve communicating with the Practitioner through live audiovisual communication technology and the disclosure of certain medical information by electronic transmission. I acknowledge that I have been given the opportunity to request an in-person assessment or other available alternative prior to the telemedicine visit and am voluntarily participating in the telemedicine visit.  I understand that I have the right to withhold or withdraw my consent to the use of telemedicine in the course of my care at any time, without affecting my right to future care or treatment,  and that the Practitioner or I may terminate the telemedicine visit at any time. I understand that I have the right to inspect all information obtained and/or recorded in the course of the telemedicine visit and may receive copies of available information for a reasonable fee.  I understand that some of the potential risks of receiving the Services via telemedicine include:   Delay or interruption in medical evaluation due to technological equipment failure or disruption;  Information transmitted may not be sufficient (e.g. poor resolution of images) to allow for appropriate medical decision making by the Practitioner; and/or   In rare instances, security protocols could fail, causing a breach of personal health information.  Furthermore, I acknowledge that it is my responsibility to provide information about my medical history, conditions and care that is complete and accurate to the best of my ability. I acknowledge that Practitioner's advice, recommendations, and/or decision may be based on factors not within their control, such as incomplete or inaccurate data provided by me or distortions of diagnostic images or specimens that may result from electronic transmissions. I understand that the  practice of medicine is not an Chief Strategy Officer and that Practitioner makes no warranties or guarantees regarding treatment outcomes. I acknowledge that I will receive a copy of this consent concurrently upon execution via email to the email address I last provided but may also request a printed copy by calling the office of [place].    I understand that my insurance will be billed for this visit.   I have read or had this consent read to me.  I understand the contents of this consent, which adequately explains the benefits and risks of the Services being provided via telemedicine.   I have been provided ample opportunity to ask questions regarding this consent and the Services and have had my questions  answered to my satisfaction.  I give my informed consent for the services to be provided through the use of telemedicine in my medical care  By participating in this telemedicine visit I agree to the above.

## 2019-06-13 ENCOUNTER — Telehealth (INDEPENDENT_AMBULATORY_CARE_PROVIDER_SITE_OTHER): Payer: Medicare Other | Admitting: Cardiology

## 2019-06-13 ENCOUNTER — Encounter: Payer: Self-pay | Admitting: Cardiology

## 2019-06-13 VITALS — Ht 64.0 in | Wt 173.0 lb

## 2019-06-13 DIAGNOSIS — I2510 Atherosclerotic heart disease of native coronary artery without angina pectoris: Secondary | ICD-10-CM

## 2019-06-13 DIAGNOSIS — I472 Ventricular tachycardia: Secondary | ICD-10-CM

## 2019-06-13 DIAGNOSIS — I4729 Other ventricular tachycardia: Secondary | ICD-10-CM

## 2019-06-13 DIAGNOSIS — I5022 Chronic systolic (congestive) heart failure: Secondary | ICD-10-CM

## 2019-06-13 DIAGNOSIS — I10 Essential (primary) hypertension: Secondary | ICD-10-CM

## 2019-06-13 NOTE — Patient Instructions (Signed)
Your physician wants you to follow-up in: [place] will receive a reminder letter in the mail two months in advance. If you don't receive a letter, please call our office to schedule the follow-up appointment.  Your physician recommends that you continue on your current medications as directed. Please refer to the Current Medication list given to you today.  Your physician recommends that you return for lab work CBC/MG/TSH/LIPIDS/HGBA1C/BMP - PLEASE FAST 6-8 HOURS PRIOR TO LABS  Thank you for choosing [facility]!!

## 2019-06-13 NOTE — Addendum Note (Signed)
Addended by: Julian Hy T on: 06/13/2019 03:29 PM   Modules accepted: Orders

## 2019-06-13 NOTE — Progress Notes (Signed)
Virtual Visit via Telephone Note   This visit type was conducted due to national recommendations for restrictions regarding the COVID-19 Pandemic (e.g. social distancing) in an effort to limit this patient's exposure and mitigate transmission in our community.  Due to her co-morbid illnesses, this patient is at least at moderate risk for complications without adequate follow up.  This format is felt to be most appropriate for this patient at this time.  The patient did not have access to video technology/had technical difficulties with video requiring transitioning to audio format only (telephone).  All issues noted in this document were discussed and addressed.  No physical exam could be performed with this format.  Please refer to the patient's chart for her  consent to telehealth for [facility].   Date:  06/13/2019   ID:  [name], DOB 1934-09-29, MRN [id number]  Patient Location: Home Provider Location: Office  PCP:  [name], MD  Cardiologist:  Carlyle Dolly, MD  Electrophysiologist:  None   Evaluation Performed:  Follow-Up Visit  Chief Complaint:  Follow up  History of Present Illness:    [name] is a 84 y.o. female seen today for follow up of the following medical problems.   1. CAD/ICM/Chronic systolic HF - hx of CABG in 2009 (LIMA-LAD, SVG-ramus, SVG-OM). Hx of RCA stent 04/2012 - she [place]recent stent [place] in [place].She remains on plavix  02/2015 echo LVEF 20-25%, grade II diastolic dysfunction - she has CRT-D device followed by EP   - no recent chest pain - no SOB or DOE. No LE edema - compliant with meds  2. OSA screen - + snoring, occasional apneic episodes, daytime fatigue. - she has not scheduled her appt with sleep medicine yet.We have placed several referals in the past.  3. VT - presented to [facility] 10/2014 with SOB, found to be in VT that was converted twice by her ICD. K at that time was 2.8. She was started on  amio gtt and transferred to [facility] - she also developed acute pulmonary edema requiring bipap and diuresis - ICD interrogation showed 9 VF events, 1 pace terminated and 3 shock terminated episodes, 6 aborted charges. 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 EP notes also mention complete heart block with brady dependent torsades.  - she underwent ICD upgrade of single chamber ICD to [facility] CRT-D   - no recent palpitaitons. Has not follow up in device clinic in quite some time  - normal device check during 04/2019 visit - denies any palpitations. No syncope, no presyncope  4. HTN - comp,iant with meds  5. Hyperlipidemia - compliant with statin -we have ordered labs several last visits but she has not completed    The patient does not have symptoms concerning for COVID-19 infection (fever, chills, cough, or new shortness of breath).    Past Medical History:  Diagnosis Date  . Alzheimer's dementia ([place])   . Chronic systolic CHF (congestive heart failure) ([place])    a. 04/2012 EF 25-30%, viable myocardium by rest thallium redistribution   . Coronary artery disease    a. 2009 CABG x 3(LIMA->LAD, VG->RAMUS, VG->OM;  b. 04/2012 Cath/PCI: LM nl, LAD 50-70p,36m, RI 152m, LCX occluded OM's, RCA 65m, 30-40d, PDA 70p, VG->RI nl, VG->OM nl, LIMA->LAD nl, EF 25-30%. RCA stented w/ 2.25x28 & 2.5x52mm Xpedition DES'.   . Hyperlipidemia   . Hypertension   . Ischemic  cardiomyopathy   . Polymorphic ventricular tachycardia (HCC)   . S/P ICD (internal cardiac defibrillator) procedure, with upgrade to ICD/CRT-D  Medtronic 11/06/2014  . Type II diabetes mellitus (HCC)    Past Surgical History:  Procedure Laterality Date  . BI-VENTRICULAR IMPLANTABLE CARDIOVERTER DEFIBRILLATOR UPGRADE N/A 11/04/2014   [place] Levonne Spiller[place]eglund.  Upgraded to MDT BiV ICD by Dr Graciela HusbandsKlein for bradycardia dependant  PMVT  . CARDIAC CATHETERIZATION  04/07/2012  . CORONARY ARTERY BYPASS GRAFT  ~ 2009   CABG X1  . goiter removed    . LEFT AND RIGHT HEART CATHETERIZATION WITH CORONARY/GRAFT ANGIOGRAM N/A 04/07/2012   Procedure: LEFT AND RIGHT HEART CATHETERIZATION WITH Isabel CapriceORONARY/GRAFT ANGIOGRAM;  Surgeon: Vesta MixerPhilip J Nahser, MD;  [facility];  Service: Cardiovascular;  Laterality: N/A;  . PERCUTANEOUS CORONARY STENT INTERVENTION (PCI-S) N/A 04/11/2012   Procedure: PERCUTANEOUS CORONARY STENT INTERVENTION (PCI-S);  Surgeon: Herby Abrahamhomas D Stuckey, MD;  [facility];  Service: Cardiovascular;  Laterality: N/A;     No outpatient medications have been marked as taking for the 06/13/19 encounter (Appointment) with Antoine PocheBranch, Calea Hribar F, MD.     Allergies:   Patient has no known allergies.   Social History   Tobacco Use  . Smoking status: Never Smoker  . Smokeless tobacco: Never Used  Substance Use Topics  . Alcohol use: No    Alcohol/week: 0.0 standard drinks  . Drug use: No     Family Hx: The patient's family history is not on file.  ROS:   Please see the history of present illness.     All other systems reviewed and are negative.   Prior CV studies:   The following studies were reviewed today:  04/2012 Cath Hemodynamics:   RA:4/4 RV:30/3 PCWP:9/4 PA:29/11  Cardiac Output  Thermodilution:3.39Index of 2.1 Fick :3.78Index 1.88  Arterial Sat:94% PA Sat:60%.  LV pressure:151/8 Aortic pressure:152/67  Angiography   Left Main:smooth and normal  Left anterior Descending: Moderate disease in the proximal segment between 50-70%. The LAD gives off a small diagonal and then has a lucent 70% stenosis. Competitive filling can be seen coming from the IMA graft.  Ramus Intermediate : The ramus intermediate is a moderate vessel and is occluded at it's mid point.   Left Circumflex:The LCx is  a small - moderate sized vessel . The OM branches are occluded.  Right Coronary Artery: Large and dominant. The entire RCA is moderately diffusely diseased. There is a tortous 40% stenosis in the proximal vessel. The mid RCA has a tight 95% stenosis. There is brisk flow through this stenosis. The distal RCA has a 30-40% stenosis . The PDA has a 70% stenosis in the proximal aspect of the Anhelica Fowers. The PLSA has no significant disease.  SVG to Ramus : The more superior graft is assumed to be to the Ramus Anthany Thornhill. Nice graft. No stenosis in the graft or native vessel ( assumed to be Ramus)  SVG to Obtuse Marginal :( the inferior graft) Normal graft. Normal native vessel.  LIMA to LAD: Normal graft. The anastomosis is normal. There is competitive flow from the native LAD.  LV Gram:done with hand injection of 7 cc contrast. Severe LV dysfunction. EF 25-30%  Complications: No apparent complications Patient did tolerate procedure well.  Contrast used:92 cc   Conclusions:  1. Severe native CAD . She has patent grafts to the LAD, OM, and assumed ramus Arland Usery. The native RCA does not have any grafts and has a tight  95% stenosis in the mid segment.   The patient was not consented for PCI prior to arriving in the cath lab and she had already been given valium. We will treat with heparin overnight and plan PCI on Tuesday.   In addition, there was some concern about her renal function and limiting contrast. Staging the PCI will allow her to clear all of the contrast given and minimize risk of contrast induced nephropathy.  2. Severe LV dysfunction:She has an EF of 25-30 %. Her filling pressures are low / normal. I do not think she needs any further diuresis over what she is getting currently.     03/2012 Echo LVEF 20-25%, grade II diastolic dysfunction, mod MR,   02/2015 Echo Study Conclusions  - Left ventricle: The cavity size was normal. Wall  thickness was increased in a pattern of moderate LVH. Systolic function was severely reduced. The estimated ejection fraction was in the range of 20% to 25%. Diffuse hypokinesis. Features are consistent with a pseudonormal left ventricular filling pattern, with concomitant abnormal relaxation and increased filling pressure (grade 2 diastolic dysfunction). - Aortic valve: Mildly calcified annulus. Trileaflet; mildly thickened leaflets. Valve area (VTI): 1.73 cm^2. Valve area (Vmax): 1.73 cm^2. Valve area (Vmean): 1.92 cm^2. - Mitral valve: Mildly calcified annulus. Mildly thickened leaflets . There was moderate regurgitation. The MR vena contracta is 0.5 cm. - Left atrium: The atrium was moderately to severely dilated. - Right ventricle: The cavity size was mildly dilated. - Right atrium: The atrium was mildly dilated. - Tricuspid valve: There was mild-moderate regurgitation. - Technically adequate study.  02/2015 Carotid US 1-39% bilateral ICA stenosis. Left subclavian stenosis in setting of LIMA graft.   Labs/Other Tests and Data Reviewed:    EKG:  No ECG reviewed.  Recent Labs: No results found for requested labs within last 8760 hours.   Recent Lipid Panel No results found for: CHOL, TRIG, HDL, CHOLHDL, LDLCALC, LDLDIRECT  Wt Readings from Last 3 Encounters:  10/19/18 162 lb 3.2 oz (73.6 kg)  06/14/18 155 lb (70.3 kg)  08/13/16 165 lb (74.8 kg)     Objective:    Vital Signs:   Today's Vitals   06/13/19 1407  Weight: 173 lb (78.5 kg)  Height: 5\' 4"  (1.626 m)   Body mass index is 29.7 kg/m.  Normal affect. Normal speech pattern and tone. Comfortable, no apparent distress. No audible signs of SOB or wheezing.   ASSESSMENT & PLAN:    1. CAD/ICM/Chronic systolic HF -denies any symptoms, continue current meds  2. OSA screen -we have made several referals however she has never scheduled her appt. We will arrange when she is ready.  3. VT  - normal ICD check recently, no symptoms. COntinue to follow with EP  4. HTN - continue current meds  5. Hyperlipidemia -cotninue statin, repeat labs   Obtain annual labs  F/u 4 months  COVID-19 Education: The signs and symptoms of COVID-19 were discussed with the patient and how to seek care for testing (follow up with PCP or arrange E-visit).  The importance of social distancing was discussed today.  Time:   Today, I have spent 14 minutes with the patient with telehealth technology discussing the above problems.     Medication Adjustments/Labs and Tests Ordered: Current medicines are reviewed at length with the patient today.  Concerns regarding medicines are outlined above.   Tests Ordered: No orders of the defined types were placed in this encounter.   Medication Changes: No orders  of the defined types were placed in this encounter.   Follow Up:  In Person in 4 month(s)  Signed, Dina Rich, MD  06/13/2019 10:33 AM    [facility]

## 2019-06-22 ENCOUNTER — Other Ambulatory Visit: Payer: Self-pay | Admitting: Cardiology

## 2019-09-05 ENCOUNTER — Telehealth: Payer: Self-pay | Admitting: Cardiology

## 2019-09-05 NOTE — Telephone Encounter (Signed)
Husband  called requesting to see if Dr. Wyline Mood will order physical therapy for patient. States that she is no longer walking. States that Dr. Wyline Mood approved therapy once before.

## 2019-09-06 NOTE — Telephone Encounter (Signed)
Typically would be arranged through pcp, we generally don't routinely make PT referals and typically leave that to the PCP since that is more there territory  Dominga Ferry MD

## 2019-10-16 ENCOUNTER — Inpatient Hospital Stay (HOSPITAL_COMMUNITY)
Admission: EM | Admit: 2019-10-16 | Discharge: 2019-12-08 | DRG: 853 | Disposition: E | Payer: Medicare Other | Attending: Family Medicine | Admitting: Family Medicine

## 2019-10-16 ENCOUNTER — Encounter (HOSPITAL_COMMUNITY): Payer: Self-pay | Admitting: Radiology

## 2019-10-16 ENCOUNTER — Emergency Department (HOSPITAL_COMMUNITY): Payer: Medicare Other

## 2019-10-16 ENCOUNTER — Other Ambulatory Visit: Payer: Self-pay

## 2019-10-16 ENCOUNTER — Ambulatory Visit: Payer: Medicare Other | Admitting: Cardiology

## 2019-10-16 DIAGNOSIS — E1122 Type 2 diabetes mellitus with diabetic chronic kidney disease: Secondary | ICD-10-CM | POA: Diagnosis present

## 2019-10-16 DIAGNOSIS — N390 Urinary tract infection, site not specified: Secondary | ICD-10-CM | POA: Diagnosis present

## 2019-10-16 DIAGNOSIS — E872 Acidosis: Secondary | ICD-10-CM | POA: Diagnosis not present

## 2019-10-16 DIAGNOSIS — A4150 Gram-negative sepsis, unspecified: Secondary | ICD-10-CM | POA: Diagnosis not present

## 2019-10-16 DIAGNOSIS — R630 Anorexia: Secondary | ICD-10-CM | POA: Diagnosis present

## 2019-10-16 DIAGNOSIS — I255 Ischemic cardiomyopathy: Secondary | ICD-10-CM | POA: Diagnosis present

## 2019-10-16 DIAGNOSIS — K921 Melena: Secondary | ICD-10-CM | POA: Diagnosis present

## 2019-10-16 DIAGNOSIS — G309 Alzheimer's disease, unspecified: Secondary | ICD-10-CM | POA: Diagnosis present

## 2019-10-16 DIAGNOSIS — L89223 Pressure ulcer of left hip, stage 3: Secondary | ICD-10-CM | POA: Diagnosis not present

## 2019-10-16 DIAGNOSIS — R6521 Severe sepsis with septic shock: Secondary | ICD-10-CM | POA: Diagnosis present

## 2019-10-16 DIAGNOSIS — R627 Adult failure to thrive: Secondary | ICD-10-CM | POA: Diagnosis present

## 2019-10-16 DIAGNOSIS — K5900 Constipation, unspecified: Secondary | ICD-10-CM | POA: Diagnosis present

## 2019-10-16 DIAGNOSIS — Z20822 Contact with and (suspected) exposure to covid-19: Secondary | ICD-10-CM | POA: Diagnosis not present

## 2019-10-16 DIAGNOSIS — N179 Acute kidney failure, unspecified: Secondary | ICD-10-CM | POA: Diagnosis present

## 2019-10-16 DIAGNOSIS — E860 Dehydration: Secondary | ICD-10-CM | POA: Diagnosis present

## 2019-10-16 DIAGNOSIS — L89151 Pressure ulcer of sacral region, stage 1: Secondary | ICD-10-CM | POA: Diagnosis not present

## 2019-10-16 DIAGNOSIS — E870 Hyperosmolality and hypernatremia: Secondary | ICD-10-CM | POA: Diagnosis not present

## 2019-10-16 DIAGNOSIS — Z515 Encounter for palliative care: Secondary | ICD-10-CM

## 2019-10-16 DIAGNOSIS — Z66 Do not resuscitate: Secondary | ICD-10-CM | POA: Diagnosis not present

## 2019-10-16 DIAGNOSIS — Z79890 Hormone replacement therapy (postmenopausal): Secondary | ICD-10-CM

## 2019-10-16 DIAGNOSIS — I130 Hypertensive heart and chronic kidney disease with heart failure and stage 1 through stage 4 chronic kidney disease, or unspecified chronic kidney disease: Secondary | ICD-10-CM | POA: Diagnosis present

## 2019-10-16 DIAGNOSIS — Z79899 Other long term (current) drug therapy: Secondary | ICD-10-CM

## 2019-10-16 DIAGNOSIS — A419 Sepsis, unspecified organism: Secondary | ICD-10-CM | POA: Diagnosis present

## 2019-10-16 DIAGNOSIS — Z95810 Presence of automatic (implantable) cardiac defibrillator: Secondary | ICD-10-CM | POA: Diagnosis present

## 2019-10-16 DIAGNOSIS — E871 Hypo-osmolality and hyponatremia: Secondary | ICD-10-CM | POA: Diagnosis not present

## 2019-10-16 DIAGNOSIS — L89203 Pressure ulcer of unspecified hip, stage 3: Secondary | ICD-10-CM | POA: Diagnosis not present

## 2019-10-16 DIAGNOSIS — I441 Atrioventricular block, second degree: Secondary | ICD-10-CM | POA: Diagnosis present

## 2019-10-16 DIAGNOSIS — Z7401 Bed confinement status: Secondary | ICD-10-CM

## 2019-10-16 DIAGNOSIS — Z955 Presence of coronary angioplasty implant and graft: Secondary | ICD-10-CM

## 2019-10-16 DIAGNOSIS — I5042 Chronic combined systolic (congestive) and diastolic (congestive) heart failure: Secondary | ICD-10-CM | POA: Diagnosis present

## 2019-10-16 DIAGNOSIS — B9620 Unspecified Escherichia coli [E. coli] as the cause of diseases classified elsewhere: Secondary | ICD-10-CM | POA: Diagnosis present

## 2019-10-16 DIAGNOSIS — R6520 Severe sepsis without septic shock: Secondary | ICD-10-CM | POA: Diagnosis not present

## 2019-10-16 DIAGNOSIS — I351 Nonrheumatic aortic (valve) insufficiency: Secondary | ICD-10-CM | POA: Diagnosis not present

## 2019-10-16 DIAGNOSIS — Z7189 Other specified counseling: Secondary | ICD-10-CM

## 2019-10-16 DIAGNOSIS — Z951 Presence of aortocoronary bypass graft: Secondary | ICD-10-CM

## 2019-10-16 DIAGNOSIS — B964 Proteus (mirabilis) (morganii) as the cause of diseases classified elsewhere: Secondary | ICD-10-CM | POA: Diagnosis present

## 2019-10-16 DIAGNOSIS — D696 Thrombocytopenia, unspecified: Secondary | ICD-10-CM | POA: Diagnosis present

## 2019-10-16 DIAGNOSIS — L89214 Pressure ulcer of right hip, stage 4: Secondary | ICD-10-CM | POA: Diagnosis present

## 2019-10-16 DIAGNOSIS — I10 Essential (primary) hypertension: Secondary | ICD-10-CM | POA: Diagnosis present

## 2019-10-16 DIAGNOSIS — E11649 Type 2 diabetes mellitus with hypoglycemia without coma: Secondary | ICD-10-CM | POA: Diagnosis not present

## 2019-10-16 DIAGNOSIS — Z6826 Body mass index (BMI) 26.0-26.9, adult: Secondary | ICD-10-CM

## 2019-10-16 DIAGNOSIS — F0390 Unspecified dementia without behavioral disturbance: Secondary | ICD-10-CM | POA: Diagnosis not present

## 2019-10-16 DIAGNOSIS — I472 Ventricular tachycardia: Secondary | ICD-10-CM | POA: Diagnosis present

## 2019-10-16 DIAGNOSIS — E785 Hyperlipidemia, unspecified: Secondary | ICD-10-CM | POA: Diagnosis present

## 2019-10-16 DIAGNOSIS — R7881 Bacteremia: Secondary | ICD-10-CM | POA: Diagnosis present

## 2019-10-16 DIAGNOSIS — L8940 Pressure ulcer of contiguous site of back, buttock and hip, unspecified stage: Secondary | ICD-10-CM

## 2019-10-16 DIAGNOSIS — F0280 Dementia in other diseases classified elsewhere without behavioral disturbance: Secondary | ICD-10-CM | POA: Diagnosis present

## 2019-10-16 DIAGNOSIS — I2510 Atherosclerotic heart disease of native coronary artery without angina pectoris: Secondary | ICD-10-CM | POA: Diagnosis present

## 2019-10-16 DIAGNOSIS — A4159 Other Gram-negative sepsis: Secondary | ICD-10-CM | POA: Diagnosis not present

## 2019-10-16 DIAGNOSIS — D62 Acute posthemorrhagic anemia: Secondary | ICD-10-CM | POA: Diagnosis not present

## 2019-10-16 DIAGNOSIS — N1832 Chronic kidney disease, stage 3b: Secondary | ICD-10-CM | POA: Diagnosis present

## 2019-10-16 DIAGNOSIS — G9341 Metabolic encephalopathy: Secondary | ICD-10-CM | POA: Diagnosis present

## 2019-10-16 DIAGNOSIS — Z7902 Long term (current) use of antithrombotics/antiplatelets: Secondary | ICD-10-CM

## 2019-10-16 DIAGNOSIS — I4729 Other ventricular tachycardia: Secondary | ICD-10-CM

## 2019-10-16 DIAGNOSIS — B957 Other staphylococcus as the cause of diseases classified elsewhere: Secondary | ICD-10-CM | POA: Diagnosis present

## 2019-10-16 DIAGNOSIS — I340 Nonrheumatic mitral (valve) insufficiency: Secondary | ICD-10-CM | POA: Diagnosis not present

## 2019-10-16 DIAGNOSIS — B966 Bacteroides fragilis [B. fragilis] as the cause of diseases classified elsewhere: Secondary | ICD-10-CM | POA: Diagnosis present

## 2019-10-16 DIAGNOSIS — Z7982 Long term (current) use of aspirin: Secondary | ICD-10-CM

## 2019-10-16 DIAGNOSIS — E119 Type 2 diabetes mellitus without complications: Secondary | ICD-10-CM

## 2019-10-16 DIAGNOSIS — R680 Hypothermia, not associated with low environmental temperature: Secondary | ICD-10-CM | POA: Diagnosis present

## 2019-10-16 LAB — RESPIRATORY PANEL BY RT PCR (FLU A&B, COVID)
Influenza A by PCR: NEGATIVE
Influenza B by PCR: NEGATIVE
SARS Coronavirus 2 by RT PCR: NEGATIVE

## 2019-10-16 LAB — CBC WITH DIFFERENTIAL/PLATELET
Abs Immature Granulocytes: 0.44 10*3/uL — ABNORMAL HIGH (ref 0.00–0.07)
Basophils Absolute: 0 10*3/uL (ref 0.0–0.1)
Basophils Relative: 0 %
Eosinophils Absolute: 0 10*3/uL (ref 0.0–0.5)
Eosinophils Relative: 0 %
HCT: 28.9 % — ABNORMAL LOW (ref 36.0–46.0)
Hemoglobin: 8.6 g/dL — ABNORMAL LOW (ref 12.0–15.0)
Immature Granulocytes: 2 %
Lymphocytes Relative: 4 %
Lymphs Abs: 0.8 10*3/uL (ref 0.7–4.0)
MCH: 29.9 pg (ref 26.0–34.0)
MCHC: 29.8 g/dL — ABNORMAL LOW (ref 30.0–36.0)
MCV: 100.3 fL — ABNORMAL HIGH (ref 80.0–100.0)
Monocytes Absolute: 0.7 10*3/uL (ref 0.1–1.0)
Monocytes Relative: 3 %
Neutro Abs: 19.7 10*3/uL — ABNORMAL HIGH (ref 1.7–7.7)
Neutrophils Relative %: 91 %
Platelets: 299 10*3/uL (ref 150–400)
RBC: 2.88 MIL/uL — ABNORMAL LOW (ref 3.87–5.11)
RDW: 14.4 % (ref 11.5–15.5)
WBC: 21.7 10*3/uL — ABNORMAL HIGH (ref 4.0–10.5)
nRBC: 0.1 % (ref 0.0–0.2)

## 2019-10-16 LAB — COMPREHENSIVE METABOLIC PANEL
ALT: 13 U/L (ref 0–44)
AST: 20 U/L (ref 15–41)
Albumin: 2.7 g/dL — ABNORMAL LOW (ref 3.5–5.0)
Alkaline Phosphatase: 81 U/L (ref 38–126)
Anion gap: 11 (ref 5–15)
BUN: 54 mg/dL — ABNORMAL HIGH (ref 8–23)
CO2: 24 mmol/L (ref 22–32)
Calcium: 7.9 mg/dL — ABNORMAL LOW (ref 8.9–10.3)
Chloride: 112 mmol/L — ABNORMAL HIGH (ref 98–111)
Creatinine, Ser: 1.83 mg/dL — ABNORMAL HIGH (ref 0.44–1.00)
GFR calc Af Amer: 29 mL/min — ABNORMAL LOW (ref >60)
GFR calc non Af Amer: 25 mL/min — ABNORMAL LOW (ref >60)
Glucose, Bld: 195 mg/dL — ABNORMAL HIGH (ref 70–99)
Potassium: 4.4 mmol/L (ref 3.5–5.1)
Sodium: 147 mmol/L — ABNORMAL HIGH (ref 135–145)
Total Bilirubin: 0.8 mg/dL (ref 0.3–1.2)
Total Protein: 6.6 g/dL (ref 6.5–8.1)

## 2019-10-16 LAB — LACTIC ACID, PLASMA
Lactic Acid, Venous: 3.1 mmol/L (ref 0.5–1.9)
Lactic Acid, Venous: 2.3 mmol/L (ref 0.5–1.9)

## 2019-10-16 LAB — PROTIME-INR
INR: 1.1 (ref 0.8–1.2)
Prothrombin Time: 14 seconds (ref 11.4–15.2)

## 2019-10-16 LAB — MAGNESIUM: Magnesium: 2.4 mg/dL (ref 1.7–2.4)

## 2019-10-16 LAB — APTT: aPTT: 33 seconds (ref 24–36)

## 2019-10-16 MED ORDER — ATORVASTATIN CALCIUM 40 MG PO TABS
80.0000 mg | ORAL_TABLET | Freq: Every day | ORAL | Status: DC
  Administered 2019-10-17 – 2019-10-20 (×4): 80 mg via ORAL
  Filled 2019-10-16 (×6): qty 2

## 2019-10-16 MED ORDER — SODIUM CHLORIDE 0.9 % IV BOLUS
2000.0000 mL | Freq: Once | INTRAVENOUS | Status: AC
  Administered 2019-10-16: 16:00:00 2000 mL via INTRAVENOUS

## 2019-10-16 MED ORDER — LEVOTHYROXINE SODIUM 25 MCG PO TABS
25.0000 ug | ORAL_TABLET | Freq: Every day | ORAL | Status: DC
  Administered 2019-10-17 – 2019-10-18 (×2): 25 ug via ORAL
  Filled 2019-10-16 (×2): qty 1

## 2019-10-16 MED ORDER — ASPIRIN EC 81 MG PO TBEC
81.0000 mg | DELAYED_RELEASE_TABLET | Freq: Every day | ORAL | Status: DC
  Administered 2019-10-17 – 2019-10-20 (×4): 81 mg via ORAL
  Filled 2019-10-16 (×4): qty 1

## 2019-10-16 MED ORDER — POLYETHYLENE GLYCOL 3350 17 G PO PACK: 17.0000 g | PACK | Freq: Every day | ORAL | Status: DC | PRN

## 2019-10-16 MED ORDER — PIPERACILLIN-TAZOBACTAM 3.375 G IVPB 30 MIN
3.3750 g | Freq: Once | INTRAVENOUS | Status: AC
  Administered 2019-10-16: 16:00:00 3.375 g via INTRAVENOUS
  Filled 2019-10-16: qty 50

## 2019-10-16 MED ORDER — MEMANTINE HCL 10 MG PO TABS
10.0000 mg | ORAL_TABLET | Freq: Two times a day (BID) | ORAL | Status: DC
  Administered 2019-10-16 – 2019-10-20 (×7): 10 mg via ORAL
  Filled 2019-10-16 (×11): qty 1

## 2019-10-16 MED ORDER — KETOROLAC TROMETHAMINE 30 MG/ML IJ SOLN
15.0000 mg | Freq: Once | INTRAMUSCULAR | Status: AC
  Administered 2019-10-16: 16:00:00 15 mg via INTRAVENOUS
  Filled 2019-10-16: qty 1

## 2019-10-16 MED ORDER — ACETAMINOPHEN 650 MG RE SUPP
650.0000 mg | Freq: Four times a day (QID) | RECTAL | Status: DC | PRN
  Administered 2019-11-10: 650 mg via RECTAL
  Filled 2019-10-16: qty 1

## 2019-10-16 MED ORDER — HEPARIN SODIUM (PORCINE) 5000 UNIT/ML IJ SOLN
5000.0000 [IU] | Freq: Three times a day (TID) | INTRAMUSCULAR | Status: DC
  Administered 2019-10-16 – 2019-10-20 (×11): 5000 [IU] via SUBCUTANEOUS
  Filled 2019-10-16 (×11): qty 1

## 2019-10-16 MED ORDER — SODIUM CHLORIDE 0.9 % IV SOLN
2.0000 g | INTRAVENOUS | Status: DC
  Administered 2019-10-17 – 2019-10-21 (×6): 2 g via INTRAVENOUS
  Filled 2019-10-16 (×6): qty 2

## 2019-10-16 MED ORDER — VANCOMYCIN HCL IN DEXTROSE 1-5 GM/200ML-% IV SOLN
1000.0000 mg | Freq: Once | INTRAVENOUS | Status: AC
  Administered 2019-10-16: 16:00:00 1000 mg via INTRAVENOUS
  Filled 2019-10-16: qty 200

## 2019-10-16 MED ORDER — METRONIDAZOLE IN NACL 5-0.79 MG/ML-% IV SOLN
500.0000 mg | Freq: Three times a day (TID) | INTRAVENOUS | Status: DC
  Administered 2019-10-17 – 2019-10-26 (×30): 500 mg via INTRAVENOUS
  Filled 2019-10-16 (×32): qty 100

## 2019-10-16 MED ORDER — CLOPIDOGREL BISULFATE 75 MG PO TABS
75.0000 mg | ORAL_TABLET | Freq: Every day | ORAL | Status: DC
  Administered 2019-10-16 – 2019-10-20 (×5): 75 mg via ORAL
  Filled 2019-10-16 (×5): qty 1

## 2019-10-16 MED ORDER — VANCOMYCIN HCL 1250 MG/250ML IV SOLN
1250.0000 mg | INTRAVENOUS | Status: AC
  Administered 2019-10-18 – 2019-10-22 (×3): 1250 mg via INTRAVENOUS
  Filled 2019-10-16 (×4): qty 250

## 2019-10-16 MED ORDER — SODIUM CHLORIDE 0.45 % IV SOLN: INTRAVENOUS | Status: DC

## 2019-10-16 MED ORDER — ACETAMINOPHEN 325 MG PO TABS
650.0000 mg | ORAL_TABLET | Freq: Four times a day (QID) | ORAL | Status: DC | PRN
  Administered 2019-10-17 – 2019-10-19 (×4): 650 mg via ORAL
  Filled 2019-10-16 (×5): qty 2

## 2019-10-16 NOTE — ED Notes (Signed)
CRITICAL VALUE ALERT  Critical Value:  Lactic acid 2.3  Date & Time Notied:  1826, 11-13-2019  Provider Notified: Dr. Vanice Sarah  Orders Received/Actions taken: no new orders received

## 2019-10-16 NOTE — ED Provider Notes (Addendum)
[facility] EMERGENCY DEPARTMENT Provider Note   CSN: [id number] Arrival date & time: 10-25-19  1229     History Chief Complaint  Patient presents with  . Skin Ulcer    [name] is a 85 y.o. female.  Patient has severe dementia and has been bedridden for 4 months.  Her husband has been taking care of her by himself.  No home health nurses.  He brought her in today because she is not eating or drinking much.  The history is provided by a relative. No language interpreter was used.  Weakness Severity:  Moderate Onset quality:  Gradual Timing:  Constant Progression:  Worsening Chronicity:  Recurrent Context: not alcohol use   Relieved by:  Nothing Worsened by:  Nothing Ineffective treatments:  None tried Associated symptoms: no abdominal pain        Past Medical History:  Diagnosis Date  . Alzheimer's dementia (HCC)   . Chronic systolic CHF (congestive heart failure) (HCC)    a. 04/2012 EF 25-30%, viable myocardium by rest thallium redistribution   . Coronary artery disease    a. 2009 CABG x 3(LIMA->LAD, VG->RAMUS, VG->OM;  b. 04/2012 Cath/PCI: LM nl, LAD 50-70p,33m, RI 118m, LCX occluded OM's, RCA 59m, 30-40d, PDA 70p, VG->RI nl, VG->OM nl, LIMA->LAD nl, EF 25-30%. RCA stented w/ 2.25x28 & 2.5x2mm Xpedition DES'.   . Hyperlipidemia   . Hypertension   . Ischemic cardiomyopathy   . Polymorphic ventricular tachycardia (HCC)   . S/P ICD (internal cardiac defibrillator) procedure, with upgrade to ICD/CRT-D  Medtronic 11/06/2014  . Type II diabetes mellitus [facility])     Patient Active Problem List   Diagnosis Date Noted  . Polymorphic ventricular tachycardia (HCC) 08/15/2015  . S/P ICD (internal cardiac defibrillator) procedure, with upgrade to ICD/CRT-D  Medtronic 11/06/2014  . Cardiomyopathy, ischemic EF 25-30% 11/06/2014  . Weakness 11/06/2014  . AICD (automatic cardioverter/defibrillator) present   . Torsades de pointes with 3 shocks, another pace terminated  10/31/2014  . Mobitz type 2 second degree atrioventricular block 10/31/2014  . Complete heart block (HCC) 10/31/2014  . CAD (coronary artery disease) 04/13/2012  . Acute systolic CHF (congestive heart failure) (HCC) 04/12/2012  . Hypertension   . High cholesterol   . Type II diabetes mellitus (HCC)     Past Surgical History:  Procedure Laterality Date  . BI-VENTRICULAR IMPLANTABLE CARDIOVERTER DEFIBRILLATOR UPGRADE N/A 11/04/2014   Initial ICD implanted in [place] by Dr Levonne Spiller.  Upgraded to MDT BiV ICD by Dr Graciela Husbands for bradycardia dependant PMVT  . CARDIAC CATHETERIZATION  04/07/2012  . CORONARY ARTERY BYPASS GRAFT  ~ 2009   CABG X1  . goiter removed    . LEFT AND RIGHT HEART CATHETERIZATION WITH CORONARY/GRAFT ANGIOGRAM N/A 04/07/2012   Procedure: LEFT AND RIGHT HEART CATHETERIZATION WITH Isabel Caprice;  Surgeon: Vesta Mixer, MD;  Location: [facility] CATH LAB;  Service: Cardiovascular;  Laterality: N/A;  . PERCUTANEOUS CORONARY STENT INTERVENTION (PCI-S) N/A 04/11/2012   Procedure: PERCUTANEOUS CORONARY STENT INTERVENTION (PCI-S);  Surgeon: Herby Abraham, MD;  Location: [facility] CATH LAB;  Service: Cardiovascular;  Laterality: N/A;     OB History   No obstetric history on file.     No family history on file.  Social History   Tobacco Use  . Smoking status: Never Smoker  . Smokeless tobacco: Never Used  Substance Use Topics  . Alcohol use: No    Alcohol/week: 0.0 standard drinks  . Drug use: No    Home Medications Prior to  Admission medications   Medication Sig Start Date End Date Taking? Authorizing Provider  acetaminophen (TYLENOL) 325 MG tablet Take 1-2 tablets (325-650 mg total) by mouth every 4 (four) hours as needed for mild pain. 11/06/14  Yes Leone Brand, NP  aspirin EC 81 MG tablet Take 81 mg by mouth daily.   Yes [provider]  atorvastatin (LIPITOR) 80 MG tablet TAKE 1 TABLET BY MOUTH EVERY DAY 06/19/18  Yes Branch, Dorothe Pea, MD  carvedilol  (COREG) 25 MG tablet TAKE 1 TABLET BY MOUTH TWICE A DAY 06/22/19  Yes Antoine Poche, MD  clopidogrel (PLAVIX) 75 MG tablet TAKE 1 TABLET BY MOUTH EVERY DAY 04/12/18  Yes Branch, Dorothe Pea, MD  ferrous gluconate (FERGON) 324 MG tablet Take 324 mg by mouth daily. 09/12/19  Yes [provider]  hydrALAZINE (APRESOLINE) 50 MG tablet TAKE 1 TABLET (50 MG TOTAL) BY MOUTH EVERY 8 (EIGHT) HOURS. 06/19/18  Yes BranchDorothe Pea, MD  ibuprofen (ADVIL) 200 MG tablet Take 200 mg by mouth every 6 (six) hours as needed for mild pain.   Yes [provider]  KLOR-CON M20 20 MEQ tablet TAKE 1 TABLET BY MOUTH EVERY DAY 08/21/18  Yes Branch, Dorothe Pea, MD  levothyroxine (SYNTHROID, LEVOTHROID) 25 MCG tablet TAKE 1 TABLET BY MOUTH DAILY BEFORE BREAKFAST. Patient taking differently: Take 25 mcg by mouth daily before breakfast.  11/09/17  Yes Leone Brand, NP  magnesium oxide (MAG-OX) 400 MG tablet Take 1 tablet (400 mg total) by mouth daily. 05/26/18  Yes BranchDorothe Pea, MD  meclizine (ANTIVERT) 25 MG tablet Take 25 mg by mouth 3 (three) times daily as needed for dizziness.   Yes [provider]  memantine (NAMENDA) 10 MG tablet Take 10 mg by mouth 2 (two) times daily. 10/23/14  Yes [provider]  nitroGLYCERIN (NITROLINGUAL) 0.4 MG/SPRAY spray Place 1 spray under the tongue every 5 (five) minutes x 3 doses as needed for chest pain (up to 3 doses, if no relief after 3rd dose, proceed to the ED for an evaluation). 05/26/18  Yes BranchDorothe Pea, MD  oxyCODONE-acetaminophen (PERCOCET/ROXICET) 5-325 MG tablet Take 1 tablet by mouth as needed. 09/18/19  Yes [provider]  sacubitril-valsartan (ENTRESTO) 97-103 MG Take 1 tablet by mouth 2 (two) times daily. 10/26/18  Yes BranchDorothe Pea, MD  spironolactone (ALDACTONE) 25 MG tablet Take 0.5 tablets (12.5 mg total) by mouth daily. 05/26/18 10/19/19 Yes Branch, Dorothe Pea, MD  furosemide (LASIX) 40 MG tablet Take 1.5  tablets (60 mg total) by mouth daily. 01/17/18 06/13/19  Antoine Poche, MD    Allergies    Patient has no known allergies.  Review of Systems   Review of Systems  Unable to perform ROS: Mental status change  Gastrointestinal: Negative for abdominal pain.  Neurological: Positive for weakness.    Physical Exam Updated Vital Signs BP (!) 93/47 (BP Location: Left Arm)   Pulse 81   Temp 97.8 F (36.6 C) (Oral)   Resp 18   SpO2 96%   Physical Exam Vitals and nursing note reviewed.  Constitutional:      Appearance: She is well-developed.  HENT:     Head: Normocephalic.     Nose: Nose normal.  Eyes:     General: No scleral icterus.    Conjunctiva/sclera: Conjunctivae normal.  Neck:     Thyroid: No thyromegaly.  Cardiovascular:     Rate and Rhythm: Normal rate and regular rhythm.  Heart sounds: No murmur. No friction rub. No gallop.   Pulmonary:     Breath sounds: No stridor. No wheezing or rales.  Chest:     Chest wall: No tenderness.  Abdominal:     General: There is no distension.     Tenderness: There is no abdominal tenderness. There is no rebound.  Musculoskeletal:     Cervical back: Neck supple.     Comments: Patient has multiple decubiti on both legs and sacral area.  She is having some pus come out of 1 ulcer  Lymphadenopathy:     Cervical: No cervical adenopathy.  Skin:    Findings: No erythema or rash.  Neurological:     Mental Status: She is alert.     Motor: No abnormal muscle tone.     Coordination: Coordination normal.     Comments: Patient alert but not oriented to anything this is her normal  Psychiatric:        Behavior: Behavior normal.    CRITICAL CARE Performed by: Bethann Berkshire Total critical care time 35 minutes Critical care time was exclusive of separately billable procedures and treating other patients. Critical care was necessary to treat or prevent imminent or life-threatening deterioration. Critical care was time spent  personally by me on the following activities: development of treatment plan with patient and/or surrogate as well as nursing, discussions with consultants, evaluation of patient's response to treatment, examination of patient, obtaining history from patient or surrogate, ordering and performing treatments and interventions, ordering and review of laboratory studies, ordering and review of radiographic studies, pulse oximetry and re-evaluation of patient's condition.  ED Results / Procedures / Treatments   Labs (all labs ordered are listed, but only abnormal results are displayed) Labs Reviewed  CULTURE, BLOOD (ROUTINE X 2)  CULTURE, BLOOD (ROUTINE X 2)  URINE CULTURE  AEROBIC CULTURE (SUPERFICIAL SPECIMEN)  RESPIRATORY PANEL BY RT PCR (FLU A&B, COVID)  LACTIC ACID, PLASMA  LACTIC ACID, PLASMA  COMPREHENSIVE METABOLIC PANEL  CBC WITH DIFFERENTIAL/PLATELET  APTT  PROTIME-INR  URINALYSIS, ROUTINE W REFLEX MICROSCOPIC    EKG None  Radiology DG Chest Port 1 View  Result Date: 11/01/2019 CLINICAL DATA:  Sepsis EXAM: PORTABLE CHEST 1 VIEW COMPARISON:  11/05/2014 FINDINGS: Stable positioning of a left-sided implanted cardiac device. Post CABG changes. Stable cardiomediastinal contours. No focal airspace consolidation, pleural effusion, or pneumothorax. IMPRESSION: No acute cardiopulmonary findings. Electronically Signed   By: Duanne Guess D.O.   On: 11/07/2019 14:47    Procedures Procedures (including critical care time)  Medications Ordered in ED Medications  sodium chloride 0.9 % bolus 2,000 mL (has no administration in time range)  ketorolac (TORADOL) 30 MG/ML injection 15 mg (has no administration in time range)  vancomycin (VANCOCIN) IVPB 1000 mg/200 mL premix (has no administration in time range)  piperacillin-tazobactam (ZOSYN) IVPB 3.375 g (3.375 g Intravenous New Bag/Given 10/11/2019 1531)    ED Course  I have reviewed the triage vital signs and the nursing  notes.  Pertinent labs & imaging results that were available during my care of the patient were reviewed by me and considered in my medical decision making (see chart for details).   Patient with dehydration and infected decubiti.  She will get multiple labs antibiotics and CT scan MDM Rules/Calculators/A&P                       Final Clinical Impression(s) / ED Diagnoses Final diagnoses:  None    Rx /  DC Orders ED Discharge Orders    None       Milton Ferguson, MD 10/23/19 1216    Milton Ferguson, MD 10/23/19 1236    Milton Ferguson, MD 10/30/19 1111    Milton Ferguson, MD 10/30/19 1112

## 2019-10-16 NOTE — ED Notes (Signed)
Updated Son Duffy Rhody) about patient being admitted and her health status

## 2019-10-16 NOTE — H&P (Signed)
History and Physical    [name] TIR:[id number] DOB: Jun 08, 1935 DOA: 11/02/2019  PCP: [name], MD   Patient coming from: Home  I have personally briefly reviewed patient's old medical records in [facility]  Chief Complaint: Decubitus ulcers, poor oral intake  HPI: [name] is a 85 y.o. female with medical history significant for Alzheimer's dementia, bedridden, coronary artery disease with systolic heart failure, torsades, AICD status hypertension and diabetes. History is obtained mostly from chart review, due to patient's dementia she is unable to give me history, though she is awake and alert.  Patient was brought to the ED with reports of multiple decubitus ulcers in the hip area over the past 2 months.  Patient spouse has been taking care of patient at home, patient has not been eating or drinking in the past several days.  ED Course: Initial blood pressure 93/47 improved to 130s to 150s, after 2 L fluid boluses.  Heart rate 80s to high 90. Temperature 97.8.  Leukocytosis 21.7.  Lactic acidosis 3.1.  Sodium 147.  Creatinine elevated 1.83..  CT abdomen and pelvis without contrast-right lateral decubitus ulcer with subcutaneous inflammatory changes extending toward the greater trochanter of the right proximal femur.  Considerable subcutaneous inflammatory changes noted with air tracking towards the lateral aspect of femur. IV Vanco and Zosyn started in ED.  EDP talked to patient's spouse, patient was made DNR.  Hospitalist to admit for further evaluation and management.  Review of Systems: Unable to ascertain due to baseline dementia.  Past Medical History:  Diagnosis Date  . Alzheimer's dementia (HCC)   . Chronic systolic CHF (congestive heart failure) (HCC)    a. 04/2012 EF 25-30%, viable myocardium by rest thallium redistribution   . Coronary artery disease    a. 2009 CABG x 3(LIMA->LAD, VG->RAMUS, VG->OM;  b. 04/2012 Cath/PCI: LM nl, LAD 50-70p,37m, RI 191m, LCX  occluded OM's, RCA 56m, 30-40d, PDA 70p, VG->RI nl, VG->OM nl, LIMA->LAD nl, EF 25-30%. RCA stented w/ 2.25x28 & 2.5x33mm Xpedition DES'.   . Hyperlipidemia   . Hypertension   . Ischemic cardiomyopathy   . Polymorphic ventricular tachycardia (HCC)   . S/P ICD (internal cardiac defibrillator) procedure, with upgrade to ICD/CRT-D  Medtronic 11/06/2014  . Type II diabetes mellitus (HCC)     Past Surgical History:  Procedure Laterality Date  . BI-VENTRICULAR IMPLANTABLE CARDIOVERTER DEFIBRILLATOR UPGRADE N/A 11/04/2014   Initial ICD implanted in [place] by Dr Levonne Spiller.  Upgraded to MDT BiV ICD by Dr Graciela Husbands for bradycardia dependant PMVT  . CARDIAC CATHETERIZATION  04/07/2012  . CORONARY ARTERY BYPASS GRAFT  ~ 2009   CABG X1  . goiter removed    . LEFT AND RIGHT HEART CATHETERIZATION WITH CORONARY/GRAFT ANGIOGRAM N/A 04/07/2012   Procedure: LEFT AND RIGHT HEART CATHETERIZATION WITH Isabel Caprice;  Surgeon: Vesta Mixer, MD;  Location: [facility] CATH LAB;  Service: Cardiovascular;  Laterality: N/A;  . PERCUTANEOUS CORONARY STENT INTERVENTION (PCI-S) N/A 04/11/2012   Procedure: PERCUTANEOUS CORONARY STENT INTERVENTION (PCI-S);  Surgeon: Herby Abraham, MD;  Location: [facility] CATH LAB;  Service: Cardiovascular;  Laterality: N/A;     reports that she has never smoked. She has never used smokeless tobacco. She reports that she does not drink alcohol or use drugs.  No Known Allergies  Unable to ascertain.  Prior to Admission medications   Medication Sig Start Date End Date Taking? Authorizing Provider  acetaminophen (TYLENOL) 325 MG tablet Take 1-2 tablets (325-650 mg total) by mouth every 4 (four)  hours as needed for mild pain. 11/06/14  Yes Leone Brand, NP  aspirin EC 81 MG tablet Take 81 mg by mouth daily.   Yes [provider]  atorvastatin (LIPITOR) 80 MG tablet TAKE 1 TABLET BY MOUTH EVERY DAY 06/19/18  Yes Branch, Dorothe Pea, MD  carvedilol (COREG) 25 MG tablet TAKE 1 TABLET BY  MOUTH TWICE A DAY 06/22/19  Yes Antoine Poche, MD  clopidogrel (PLAVIX) 75 MG tablet TAKE 1 TABLET BY MOUTH EVERY DAY 04/12/18  Yes Branch, Dorothe Pea, MD  ferrous gluconate (FERGON) 324 MG tablet Take 324 mg by mouth daily. 09/12/19  Yes [provider]  hydrALAZINE (APRESOLINE) 50 MG tablet TAKE 1 TABLET (50 MG TOTAL) BY MOUTH EVERY 8 (EIGHT) HOURS. 06/19/18  Yes BranchDorothe Pea, MD  ibuprofen (ADVIL) 200 MG tablet Take 200 mg by mouth every 6 (six) hours as needed for mild pain.   Yes [provider]  KLOR-CON M20 20 MEQ tablet TAKE 1 TABLET BY MOUTH EVERY DAY 08/21/18  Yes Branch, Dorothe Pea, MD  levothyroxine (SYNTHROID, LEVOTHROID) 25 MCG tablet TAKE 1 TABLET BY MOUTH DAILY BEFORE BREAKFAST. Patient taking differently: Take 25 mcg by mouth daily before breakfast.  11/09/17  Yes Leone Brand, NP  magnesium oxide (MAG-OX) 400 MG tablet Take 1 tablet (400 mg total) by mouth daily. 05/26/18  Yes BranchDorothe Pea, MD  meclizine (ANTIVERT) 25 MG tablet Take 25 mg by mouth 3 (three) times daily as needed for dizziness.   Yes [provider]  memantine (NAMENDA) 10 MG tablet Take 10 mg by mouth 2 (two) times daily. 10/23/14  Yes [provider]  nitroGLYCERIN (NITROLINGUAL) 0.4 MG/SPRAY spray Place 1 spray under the tongue every 5 (five) minutes x 3 doses as needed for chest pain (up to 3 doses, if no relief after 3rd dose, proceed to the ED for an evaluation). 05/26/18  Yes BranchDorothe Pea, MD  oxyCODONE-acetaminophen (PERCOCET/ROXICET) 5-325 MG tablet Take 1 tablet by mouth as needed. 09/18/19  Yes [provider]  sacubitril-valsartan (ENTRESTO) 97-103 MG Take 1 tablet by mouth 2 (two) times daily. 10/26/18  Yes BranchDorothe Pea, MD  spironolactone (ALDACTONE) 25 MG tablet Take 0.5 tablets (12.5 mg total) by mouth daily. 05/26/18 10/19/19 Yes Branch, Dorothe Pea, MD  furosemide (LASIX) 40 MG tablet Take 1.5 tablets (60 mg total) by mouth daily.  01/17/18 06/13/19  Antoine Poche, MD    Physical Exam: Exam limited by patient's baseline dementia Vitals:   10/27/2019 1500 10/28/2019 1600 10/12/2019 1630 10/28/2019 1700  BP: (!) 140/55 (!) 154/125 (!) 154/105 (!) 135/59  Pulse:      Resp: 16 16 19 18   Temp:      TempSrc:      SpO2:        Constitutional: NAD, calm, comfortable Vitals:   10/11/2019 1500 10/10/2019 1600 11/01/2019 1630 10/09/2019 1700  BP: (!) 140/55 (!) 154/125 (!) 154/105 (!) 135/59  Pulse:      Resp: 16 16 19 18   Temp:      TempSrc:      SpO2:       Eyes: PERRL, lids and conjunctivae normal ENMT: Mucous membranes are dry.  Neck: normal, supple, no masses, no thyromegaly Respiratory: clear to auscultation bilaterally, no wheezing, no crackles. Normal respiratory effort. No accessory muscle use.  Cardiovascular: Regular rate and rhythm, no murmurs / rubs / gallops.  1+ pitting extremity edema to lower legs. 2+ pedal pulses.  Abdomen: no tenderness, no masses palpated. No hepatosplenomegaly. Bowel sounds positive.  Musculoskeletal: no clubbing / cyanosis.  Bedridden. Skin: Wounds to both left and right hip, right much worse than left.  Left hip has 2 ulcers both with purulent base and drainage.   Pressure ulcer to right heel, skin mostly intact with bullae and weeping.   No induration Neurologic: Exam limited by patient's mental status, but follows directions and grips my hands, okay grip strength bilateral upper extremities, slight movement to right lower extremity, no movement to left lower extremity. Psychiatric: Awake, alert, but not able to tell me her name, where she is or why she is here.  Speech confused.  Right hip        Left hip     Labs on Admission: I have personally reviewed following labs and imaging studies  CBC: Recent Labs  Lab 10/10/2019 1515  WBC 21.7*  NEUTROABS 19.7*  HGB 8.6*  HCT 28.9*  MCV 100.3*  PLT 270   Basic Metabolic Panel: Recent Labs  Lab 10/28/2019 1515  NA 147*  K  4.4  CL 112*  CO2 24  GLUCOSE 195*  BUN 54*  CREATININE 1.83*  CALCIUM 7.9*   Liver Function Tests: Recent Labs  Lab 11/02/2019 1515  AST 20  ALT 13  ALKPHOS 81  BILITOT 0.8  PROT 6.6  ALBUMIN 2.7*   Coagulation Profile: Recent Labs  Lab 10/29/2019 1515  INR 1.1    Radiological Exams on Admission: CT ABDOMEN PELVIS WO CONTRAST  Result Date: 10/08/2019 CLINICAL DATA:  Decubitus ulcers EXAM: CT ABDOMEN AND PELVIS WITHOUT CONTRAST TECHNIQUE: Multidetector CT imaging of the abdomen and pelvis was performed following the standard protocol without IV contrast. COMPARISON:  None. FINDINGS: Lower chest: No acute abnormality. Hepatobiliary: Scattered calcifications are noted within the liver. Gallbladder is well distended with multiple dependent gallstones. Pancreas: Unremarkable. No pancreatic ductal dilatation or surrounding inflammatory changes. Spleen: Normal in size without focal abnormality. Adrenals/Urinary Tract: Adrenal glands are within normal limits. Kidneys are well visualized bilaterally with renal vascular calcifications. No renal stones are seen. No obstructive changes are noted. The bladder is partially distended. Stomach/Bowel: The appendix is not well visualized although no inflammatory changes to suggest appendicitis are noted. Retained fecal material is noted throughout the colon consistent with a mild degree of constipation without obstructive change. No small bowel abnormality is seen. Stomach is unremarkable. Vascular/Lymphatic: Aortic atherosclerosis. No enlarged abdominal or pelvic lymph nodes. Reproductive: Uterus and bilateral adnexa are unremarkable. Other: No abdominal wall hernia or abnormality. No abdominopelvic ascites. Musculoskeletal: Mild changes of anasarca are noted. Decubitus ulcer is noted laterally near the right hip with inflammatory changes extending from the skin towards the greater trochanter. This area measures approximately 7.6 x 4.3 cm. No definitive bony  erosive changes to suggest osteomyelitis are noted at this time. No other definitive decubitus ulcer is seen. Degenerative changes of lumbar spine are seen. IMPRESSION: Right lateral decubitus ulcer with subcutaneous inflammatory changes extending towards the greater trochanter of the right proximal femur. No definitive bony destructive changes are seen. Considerable subcutaneous inflammatory changes noted with air tracking towards the lateral aspect of the femur. Changes of mild constipation. Cholelithiasis without complicating factors. Electronically Signed   By: Inez Catalina M.D.   On: 10/20/2019 16:51   DG Chest Port 1 View  Result Date: 10/17/2019 CLINICAL DATA:  Sepsis EXAM: PORTABLE CHEST 1 VIEW COMPARISON:  11/05/2014 FINDINGS: Stable positioning of a left-sided implanted cardiac device. Post CABG changes. Stable  cardiomediastinal contours. No focal airspace consolidation, pleural effusion, or pneumothorax. IMPRESSION: No acute cardiopulmonary findings. Electronically Signed   By: Duanne Guess D.O.   On: 10/30/2019 14:47    EKG: Independently reviewed.  Sinus rhythm-95, QTc prolonged 539.  Assessment/Plan Principal Problem:   Sepsis (HCC) Active Problems:   Hypertension   Type II diabetes mellitus (HCC)   S/P ICD (internal cardiac defibrillator) procedure, with upgrade to ICD/CRT-D  Medtronic   Cardiomyopathy, ischemic EF 25-30%   Decubitus ulcer   Sepsis- leukocytosis 21.7, hypotensive systolic 90s, lactic acidosis of 3.1.  Likely source chronic decubitus ulcers-left ulcers with purulent base and drainage, left ulcers much worse than right.  Chest x-ray unremarkable.  CT abdomen and pelvis-right lateral decubitus ulcer with subcutaneous inflammatory changes. -Will continue broad-spectrum antibiotics with IV vancomycin cefepime and metronidazole -Trend lactic acid 3.1 > 2.3 - UA pending - Follow-up blood and urine cultures -Follow-up respiratory virus panel-negative for influenza  and COVID-19. - 2 L bolus N/s given, Cont 1/2 n/s 75cc/hr x 15 hrs - BMP, CBC a.m. -Surgical consult if debridement would be helpful.  Probable acute kidney injury-likely prerenal, creatinine 1.83, baseline from 2016 creatinine ~1.  No recent lab work in [employer]. -IV fluids  -Hold home Lasix  Anemia, unspecified acute or chronic-hemoglobin 8.6, 4 years ago 11.5.  Increased MCV 100.3.  -Check B12, folate in a.m -CBC a.m. -Continue aspirin and Plavix for now  Prolonged QTC-539.  Potassium 4.4.  -Check magnesium-normal at 2.4.  Mild hypernatremia-sodium 147, likely hypovolemic. - 2 L bolus given -BMP a.m.  Alzheimer's dementia-advanced at baseline, patient is bedridden.  With decubitus ulcers.  Also has also to right heel, skin mostly intact, with bullae and weeping.  Systolic heart failure, AICD status, history of torsades-stable and compensated, patient appears dehydrated, last echo on file 2016 EF 20 to 25%, G2DD. No rent echo on care everywhere. -Has not taking several of her medications in several days.  Coronary artery disease, hx of CABG in 2009 (LIMA-LAD, SVG-ramus, SVG-OM). Hx of RCA stent 04/2012.  EKG today without significant ST or T wave changes. -Resume home aspirin, Plavix, statins.  Hypertension-initial hypotension systolic 90s. -Hold home carvedilol, Lasix, hydralazine, Entresto, spironolactone, has not taken several of her medications in several days.  Diabetes mellitus-random glucose 195.  Diet controlled -Daily fasting CBGs   DVT prophylaxis: s/c Heparin Code Status: DNR. Family Communication: None at bedside. Disposition Plan: > 2 days, pending culture results, resolution of sepsis physiology, may need placement. Consults called: General surgery Admission status: Inpatient, telemetry I certify that at the point of admission it is my clinical judgment that the patient will require inpatient hospital care spanning beyond 2 midnights from the point of  admission due to high intensity of service, high risk for further deterioration and high frequency of surveillance required. The following factors support the patient status of inpatient: Sepsis requiring IV antibiotics pending culture results.   Onnie Boer MD [facility]  10/20/2019, 7:40 PM

## 2019-10-16 NOTE — ED Triage Notes (Signed)
Multiple decubitus ulcers in hip area x 2 months. Also has large amount of swelling in both ankles. Patient is bedridden

## 2019-10-16 NOTE — ED Provider Notes (Signed)
Blood pressure (!) 93/47, pulse 81, temperature 97.8 F (36.6 C), temperature source Oral, resp. rate 18, SpO2 96 %.  Assuming care from Dr. Estell Harpin.  In short, [name] is a 85 y.o. female with a chief complaint of Skin Ulcer .  Refer to the original H&P for additional details.  The current plan of care is to f/u on labs, imaging, and admit.  Discussed patient's case with [facility] to request admission. Patient and family (if present) updated with plan. Care transferred to [facility] service.  I reviewed all nursing notes, vitals, pertinent old records, EKGs, labs, imaging (as available).     Maia Plan, MD 10-26-19 1945

## 2019-10-16 NOTE — Progress Notes (Deleted)
Clinical Summary [name] is a 85 y.o.female seen today for follow up of the following medical problems.   1. CAD/ICM/Chronic systolic HF - hx of CABG in 2009 (LIMA-LAD, SVG-ramus, SVG-OM). Hx of RCA stent 04/2012 - she reports alaterrecent stent placed in [place].She remains on plavix  02/2015 echo LVEF 20-25%, grade II diastolic dysfunction - she has CRT-D device followed by EP   - no recent chest pain - no SOB or DOE. No LE edema - compliant with meds  2. OSA screen - + snoring, occasional apneic episodes, daytime fatigue. - she has not scheduled her appt with sleep medicine yet.We have placed several referals in the past.  3. VT - presented to [facility] 10/2014 with SOB, found to be in VT that was converted twice by her ICD. K at that time was 2.8. She was started on amio gtt and transferred to [facility] - she also developed acute pulmonary edema requiring bipap and diuresis - ICD interrogation showed 9 VF events, 1 pace terminated and 3 shock terminated episodes, 6 aborted charges. 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 EP notes also mention complete heart block with brady dependent torsades.  - she underwent ICD upgrade of single chamber ICD to [facility] CRT-D   - no recent palpitaitons.Has not follow up in device clinic in quite some time  - normal device check during 04/2019 visit - denies any palpitations. No syncope, no presyncope  4. HTN - comp,iant with meds  5. Hyperlipidemia -compliant with statin -we have ordered labs several last visits but she has not completed   Past Medical History:  Diagnosis Date  . Alzheimer's dementia ([place])   . Chronic systolic CHF (congestive heart failure) ([place])    a. 04/2012 EF 25-30%, viable myocardium by rest thallium redistribution   . Coronary artery disease    a. 2009 CABG x 3(LIMA->LAD, VG->RAMUS, VG->OM;  b.  04/2012 Cath/PCI: LM nl, LAD 50-70p,20m, RI 14m, LCX occluded OM's, RCA 30m, 30-40d, PDA 70p, VG->RI nl, VG->OM nl, LIMA->LAD nl, EF 25-30%. RCA stented w/ 2.25x28 & 2.5x26mm Xpedition DES'.   . Hyperlipidemia   . Hypertension   . Ischemic cardiomyopathy   . Polymorphic ventricular tachycardia ([place])   . S/P ICD (internal cardiac defibrillator) procedure, with upgrade to ICD/CRT-D  [employer] 11/06/2014  . Type II diabetes mellitus ([facility])      No Known Allergies   Current Outpatient Medications  Medication Sig Dispense Refill  . acetaminophen (TYLENOL) 325 MG tablet Take 1-2 tablets (325-650 mg total) by mouth every 4 (four) hours as needed for mild pain.    Marland Kitchen aspirin EC 81 MG tablet Take 81 mg by mouth daily.    Marland Kitchen atorvastatin (LIPITOR) 80 MG tablet TAKE 1 TABLET BY MOUTH EVERY DAY 30 tablet 6  . carvedilol (COREG) 25 MG tablet TAKE 1 TABLET BY MOUTH TWICE A DAY 180 tablet 1  . clopidogrel (PLAVIX) 75 MG tablet TAKE 1 TABLET BY MOUTH EVERY DAY 90 tablet 2  . furosemide (LASIX) 40 MG tablet Take 1.5 tablets (60 mg total) by mouth daily. 135 tablet 1  . hydrALAZINE (APRESOLINE) 50 MG tablet TAKE 1 TABLET (50 MG TOTAL) BY MOUTH EVERY 8 (EIGHT) HOURS. 90 tablet 6  . KLOR-CON M20 20 MEQ tablet TAKE 1 TABLET BY MOUTH EVERY DAY 90 tablet 0  . levothyroxine (SYNTHROID, LEVOTHROID) 25 MCG tablet TAKE 1 TABLET BY  MOUTH DAILY BEFORE BREAKFAST. 30 tablet 6  . magnesium oxide (MAG-OX) 400 MG tablet Take 1 tablet (400 mg total) by mouth daily. 90 tablet 1  . meclizine (ANTIVERT) 25 MG tablet Take 25 mg by mouth 3 (three) times daily as needed for dizziness.    . memantine (NAMENDA) 10 MG tablet Take 10 mg by mouth 2 (two) times daily.  5  . nitroGLYCERIN (NITROLINGUAL) 0.4 MG/SPRAY spray Place 1 spray under the tongue every 5 (five) minutes x 3 doses as needed for chest pain (up to 3 doses, if no relief after 3rd dose, proceed to the ED for an evaluation). 12 g 0  . sacubitril-valsartan (ENTRESTO) 97-103  MG Take 1 tablet by mouth 2 (two) times daily. 60 tablet 6  . spironolactone (ALDACTONE) 25 MG tablet Take 0.5 tablets (12.5 mg total) by mouth daily. 45 tablet 1   No current facility-administered medications for this visit.     Past Surgical History:  Procedure Laterality Date  . BI-VENTRICULAR IMPLANTABLE CARDIOVERTER DEFIBRILLATOR UPGRADE N/A 11/04/2014   Initial ICD implanted in [place] by Dr Levonne Spiller.  Upgraded to MDT BiV ICD by Dr Graciela Husbands for bradycardia dependant PMVT  . CARDIAC CATHETERIZATION  04/07/2012  . CORONARY ARTERY BYPASS GRAFT  ~ 2009   CABG X1  . goiter removed    . LEFT AND RIGHT HEART CATHETERIZATION WITH CORONARY/GRAFT ANGIOGRAM N/A 04/07/2012   Procedure: LEFT AND RIGHT HEART CATHETERIZATION WITH Isabel Caprice;  Surgeon: Vesta Mixer, MD;  Location: [facility] CATH LAB;  Service: Cardiovascular;  Laterality: N/A;  . PERCUTANEOUS CORONARY STENT INTERVENTION (PCI-S) N/A 04/11/2012   Procedure: PERCUTANEOUS CORONARY STENT INTERVENTION (PCI-S);  Surgeon: Herby Abraham, MD;  Location: [facility] CATH LAB;  Service: Cardiovascular;  Laterality: N/A;     No Known Allergies    No family history on file.   Social History Ms. Towle reports that she has never smoked. She has never used smokeless tobacco. Ms. Torok reports no history of alcohol use.   Review of Systems CONSTITUTIONAL: No weight loss, fever, chills, weakness or fatigue.  HEENT: Eyes: No visual loss, blurred vision, double vision or yellow sclerae.No hearing loss, sneezing, congestion, runny nose or sore throat.  SKIN: No rash or itching.  CARDIOVASCULAR:  RESPIRATORY: No shortness of breath, cough or sputum.  GASTROINTESTINAL: No anorexia, nausea, vomiting or diarrhea. No abdominal pain or blood.  GENITOURINARY: No burning on urination, no polyuria NEUROLOGICAL: No headache, dizziness, syncope, paralysis, ataxia, numbness or tingling in the extremities. No change in bowel or bladder control.    MUSCULOSKELETAL: No muscle, back pain, joint pain or stiffness.  LYMPHATICS: No enlarged nodes. No history of splenectomy.  PSYCHIATRIC: No history of depression or anxiety.  ENDOCRINOLOGIC: No reports of sweating, cold or heat intolerance. No polyuria or polydipsia.  Marland Kitchen   Physical Examination There were no vitals filed for this visit. There were no vitals filed for this visit.  Gen: resting comfortably, no acute distress HEENT: no scleral icterus, pupils equal round and reactive, no palptable cervical adenopathy,  CV Resp: Clear to auscultation bilaterally GI: abdomen is soft, non-tender, non-distended, normal bowel sounds, no hepatosplenomegaly MSK: extremities are warm, no edema.  Skin: warm, no rash Neuro:  no focal deficits Psych: appropriate affect   Diagnostic Studies  04/2012 Cath Hemodynamics:   RA:4/4 RV:30/3 PCWP:9/4 PA:29/11  Cardiac Output  Thermodilution:3.39Index of 2.1 Fick :3.78Index 1.88  Arterial Sat:94% PA Sat:60%.  LV pressure:151/8 Aortic pressure:152/67  Angiography   Left Main:smooth and normal  Left anterior Descending: Moderate disease in the proximal segment between 50-70%. The LAD gives off a small diagonal and then has a lucent 70% stenosis. Competitive filling can be seen coming from the IMA graft.  Ramus Intermediate : The ramus intermediate is a moderate vessel and is occluded at it's mid point.   Left Circumflex:The LCx is a small - moderate sized vessel . The OM branches are occluded.  Right Coronary Artery: Large and dominant. The entire RCA is moderately diffusely diseased. There is a tortous 40% stenosis in the proximal vessel. The mid RCA has a tight 95% stenosis. There is brisk flow through this stenosis. The distal RCA has a 30-40% stenosis . The PDA has a 70% stenosis in the proximal aspect of the Sahana Boyland. The PLSA has  no significant disease.  SVG to Ramus : The more superior graft is assumed to be to the Ramus Alika Saladin. Nice graft. No stenosis in the graft or native vessel ( assumed to be Ramus)  SVG to Obtuse Marginal :( the inferior graft) Normal graft. Normal native vessel.  LIMA to LAD: Normal graft. The anastomosis is normal. There is competitive flow from the native LAD.  LV Gram:done with hand injection of 7 cc contrast. Severe LV dysfunction. EF 25-30%  Complications: No apparent complications Patient did tolerate procedure well.  Contrast used:92 cc   Conclusions:  1. Severe native CAD . She has patent grafts to the LAD, OM, and assumed ramus Coden Franchi. The native RCA does not have any grafts and has a tight 95% stenosis in the mid segment.   The patient was not consented for PCI prior to arriving in the cath lab and she had already been given valium. We will treat with heparin overnight and plan PCI on Tuesday.   In addition, there was some concern about her renal function and limiting contrast. Staging the PCI will allow her to clear all of the contrast given and minimize risk of contrast induced nephropathy.  2. Severe LV dysfunction:She has an EF of 25-30 %. Her filling pressures are low / normal. I do not think she needs any further diuresis over what she is getting currently.     03/2012 Echo LVEF 20-25%, grade II diastolic dysfunction, mod MR,   02/2015 Echo Study Conclusions  - Left ventricle: The cavity size was normal. Wall thickness was increased in a pattern of moderate LVH. Systolic function was severely reduced. The estimated ejection fraction was in the range of 20% to 25%. Diffuse hypokinesis. Features are consistent with a pseudonormal left ventricular filling pattern, with concomitant abnormal relaxation and increased filling pressure (grade 2 diastolic dysfunction). - Aortic valve: Mildly calcified annulus. Trileaflet;  mildly thickened leaflets. Valve area (VTI): 1.73 cm^2. Valve area (Vmax): 1.73 cm^2. Valve area (Vmean): 1.92 cm^2. - Mitral valve: Mildly calcified annulus. Mildly thickened leaflets . There was moderate regurgitation. The MR vena contracta is 0.5 cm. - Left atrium: The atrium was moderately to severely dilated. - Right ventricle: The cavity size was mildly dilated. - Right atrium: The atrium was mildly dilated. - Tricuspid valve: There was mild-moderate regurgitation. - Technically adequate study.  02/2015 Carotid US 1-39% bilateral ICA stenosis. Left subclavian stenosis in setting of LIMA graft.    Assessment and Plan   1. CAD/ICM/Chronic systolic HF -denies any symptoms, continue current meds  2. OSA screen -we have made several referals however she has never scheduled her appt. We will arrangewhenshe is ready.  3. VT -normal ICD check recently, no  symptoms. COntinue to follow with EP  4. HTN - continue current meds  5. Hyperlipidemia -cotninue statin, repeat labs     Antoine Poche, M.D.

## 2019-10-16 NOTE — Progress Notes (Signed)
Pharmacy Antibiotic Note  [name] is a 85 y.o. female admitted on 2019-11-08 with sepsis likely due to chronic decubitus ulcers.  In the ED, patient received Vancomycin 1gm and Zosyn 3.375gm IV x 1 dose each.  Pharmacy has been consulted for Vancomycin and Cefepime dosing for wound infection.  Plan:  Cefepime 2gm IV q24h Vancomycin 1250 mg IV Q 48 hrs. Goal AUC 400-550.  Expected AUC: 539  SCr used: 1.73  Follow renal function and adjust antibiotics as needed  Monitor vancomycin levels as needed  Weight: 158 lb 11.2 oz (72 kg)  Temp (24hrs), Avg:97.8 F (36.6 C), Min:97.8 F (36.6 C), Max:97.8 F (36.6 C)  Recent Labs  Lab 11-08-19 1515 11-08-2019 1740  WBC 21.7*  --   CREATININE 1.83*  --   LATICACIDVEN 3.1* 2.3*    Estimated Creatinine Clearance: 23 mL/min (A) (by C-G formula based on SCr of 1.83 mg/dL (H)).    No Known Allergies  Antimicrobials this admission: 3/9 Zosyn x 1 dose 3/9 Vanc >>  3/9 Cefepime >> 3/9 Metronidazole >>  Dose adjustments this admission:   Microbiology results: 3/9 BCx: sent 3/9 Wound Cx:  3/9: Covid: negative 3/9 Influenza A: negative 3/9 Influenza B: negative   Thank you for allowing pharmacy to be a part of this patient's care.  Maryellen Pile, PharmD 11/08/2019 8:03 PM

## 2019-10-16 NOTE — Progress Notes (Signed)
Notified bedside nurse of need to draw repeat lactic acid.  Asked Rn if she would be able to get a repeat lactic acid, Pt is a difficult stick per RN and lab is currently trying to obtain a repeat.

## 2019-10-16 NOTE — ED Notes (Signed)
Patient is a hard stick. Ultrasound used to get one IV site , EDP  Stated it was ok to have only One IV site due to hard stick with ultrasound.

## 2019-10-16 NOTE — ED Notes (Signed)
Lab attempting to draw labs, continues to be a hard stick.

## 2019-10-16 NOTE — ED Notes (Signed)
Date and time results received: 10-25-2019 1655   Test: Lactic acid Critical Value: 3.1  Name of Provider Notified: Dr Jacqulyn Bath

## 2019-10-17 ENCOUNTER — Other Ambulatory Visit: Payer: Self-pay

## 2019-10-17 DIAGNOSIS — Z95810 Presence of automatic (implantable) cardiac defibrillator: Secondary | ICD-10-CM

## 2019-10-17 DIAGNOSIS — L89151 Pressure ulcer of sacral region, stage 1: Secondary | ICD-10-CM | POA: Diagnosis present

## 2019-10-17 DIAGNOSIS — L8940 Pressure ulcer of contiguous site of back, buttock and hip, unspecified stage: Secondary | ICD-10-CM

## 2019-10-17 DIAGNOSIS — A419 Sepsis, unspecified organism: Secondary | ICD-10-CM | POA: Insufficient documentation

## 2019-10-17 DIAGNOSIS — L89223 Pressure ulcer of left hip, stage 3: Secondary | ICD-10-CM | POA: Diagnosis present

## 2019-10-17 DIAGNOSIS — I10 Essential (primary) hypertension: Secondary | ICD-10-CM

## 2019-10-17 DIAGNOSIS — R627 Adult failure to thrive: Secondary | ICD-10-CM

## 2019-10-17 LAB — BASIC METABOLIC PANEL
Anion gap: 12 (ref 5–15)
BUN: 54 mg/dL — ABNORMAL HIGH (ref 8–23)
CO2: 20 mmol/L — ABNORMAL LOW (ref 22–32)
Calcium: 7.6 mg/dL — ABNORMAL LOW (ref 8.9–10.3)
Chloride: 116 mmol/L — ABNORMAL HIGH (ref 98–111)
Creatinine, Ser: 1.67 mg/dL — ABNORMAL HIGH (ref 0.44–1.00)
GFR calc Af Amer: 33 mL/min — ABNORMAL LOW (ref >60)
GFR calc non Af Amer: 28 mL/min — ABNORMAL LOW (ref >60)
Glucose, Bld: 166 mg/dL — ABNORMAL HIGH (ref 70–99)
Potassium: 4.3 mmol/L (ref 3.5–5.1)
Sodium: 148 mmol/L — ABNORMAL HIGH (ref 135–145)

## 2019-10-17 LAB — CBC
HCT: 32.6 % — ABNORMAL LOW (ref 36.0–46.0)
Hemoglobin: 9.4 g/dL — ABNORMAL LOW (ref 12.0–15.0)
MCH: 30.2 pg (ref 26.0–34.0)
MCHC: 28.8 g/dL — ABNORMAL LOW (ref 30.0–36.0)
MCV: 104.8 fL — ABNORMAL HIGH (ref 80.0–100.0)
Platelets: 260 10*3/uL (ref 150–400)
RBC: 3.11 MIL/uL — ABNORMAL LOW (ref 3.87–5.11)
RDW: 14.5 % (ref 11.5–15.5)
WBC: 25.6 10*3/uL — ABNORMAL HIGH (ref 4.0–10.5)
nRBC: 0 % (ref 0.0–0.2)

## 2019-10-17 LAB — GLUCOSE, CAPILLARY
Glucose-Capillary: 153 mg/dL — ABNORMAL HIGH (ref 70–99)
Glucose-Capillary: 134 mg/dL — ABNORMAL HIGH (ref 70–99)

## 2019-10-17 LAB — URINALYSIS, ROUTINE W REFLEX MICROSCOPIC
Bilirubin Urine: NEGATIVE
Glucose, UA: NEGATIVE mg/dL
Ketones, ur: 5 mg/dL — AB
Nitrite: NEGATIVE
Protein, ur: 30 mg/dL — AB
Specific Gravity, Urine: 1.021 (ref 1.005–1.030)
WBC, UA: >50 WBC/hpf — ABNORMAL HIGH (ref 0–5)
pH: 5 (ref 5.0–8.0)

## 2019-10-17 LAB — BLOOD CULTURE ID PANEL (REFLEXED)

## 2019-10-17 LAB — FOLATE: Folate: 7.2 ng/mL (ref >5.9)

## 2019-10-17 LAB — VITAMIN B12: Vitamin B-12: 199 pg/mL (ref 180–914)

## 2019-10-17 MED ORDER — FENTANYL CITRATE (PF) 100 MCG/2ML IJ SOLN
25.0000 ug | Freq: Once | INTRAMUSCULAR | Status: AC
  Administered 2019-10-17: 25 ug via INTRAVENOUS
  Filled 2019-10-17: qty 2

## 2019-10-17 MED ORDER — MORPHINE SULFATE (PF) 2 MG/ML IV SOLN
2.0000 mg | Freq: Once | INTRAVENOUS | Status: AC
  Administered 2019-10-17: 2 mg via INTRAVENOUS
  Filled 2019-10-17: qty 1

## 2019-10-17 MED ORDER — COLLAGENASE 250 UNIT/GM EX OINT
TOPICAL_OINTMENT | Freq: Two times a day (BID) | CUTANEOUS | Status: DC
  Administered 2019-10-20 – 2019-11-07 (×9): 1 via TOPICAL
  Filled 2019-10-17 (×9): qty 30

## 2019-10-17 MED ORDER — COLLAGENASE 250 UNIT/GM EX OINT
TOPICAL_OINTMENT | Freq: Every day | CUTANEOUS | Status: DC
  Filled 2019-10-17: qty 30

## 2019-10-17 MED ORDER — DEXTROSE 5 % IV SOLN: INTRAVENOUS | Status: DC

## 2019-10-17 NOTE — Progress Notes (Signed)
[facility]  Local debridement done at the bedside to left and right hips. Ordered Santyl and Bid dressing changes to start tomorrow. Will reassess in a few days and see if further excisional debridement needed.   Full note to follow in the chart.   Algis Greenhouse, MD [facility] [address] [place], [place] [address] [phone] (office)

## 2019-10-17 NOTE — Consult Note (Signed)
[facility] Consult  Reason for Consult:Decubitus Ulcers Referring Physician: Dr. Carles Collet  Chief Complaint    Skin Ulcer      HPI: [name] is a 85 y.o. female with a past medical history significant for Alzheimer's dementia, coronary artery disease with systolic heart failure, torsades, and AICD status hypertension and diabetes who presented to the emergency room yesterday with decubitus ulcers and sepsis. Patient is bedridden. Patient was somnolent and able to provide some history, however, due to the patient's dementia history was obtained mostly through chart review. The decubitus ulcers  started approximately 2 months ago and have progressively worsened. Patient states the pain waxes and wanes but is not currently painful. She was unable to provide any quantity/quality of pain or aggravating/allevieting agents.   Past Medical History:  Diagnosis Date  . Alzheimer's dementia ([place])   . Chronic systolic CHF (congestive heart failure) ([place])    a. 04/2012 EF 25-30%, viable myocardium by rest thallium redistribution   . Coronary artery disease    a. 2009 CABG x 3(LIMA->LAD, VG->RAMUS, VG->OM;  b. 04/2012 Cath/PCI: LM nl, LAD 50-70p,40m, RI 144m, LCX occluded OM's, RCA 59m, 30-40d, PDA 70p, VG->RI nl, VG->OM nl, LIMA->LAD nl, EF 25-30%. RCA stented w/ 2.25x28 & 2.5x60mm Xpedition DES'.   . Hyperlipidemia   . Hypertension   . Ischemic cardiomyopathy   . Polymorphic ventricular tachycardia ([place])   . S/P ICD (internal cardiac defibrillator) procedure, with upgrade to ICD/CRT-D  Med[employer] 11/06/2014  . Type II diabetes mellitus ([place])     Past Surgical History:  Procedure Laterality Date  . BI-VENTRICULAR IMPLANTABLE CARDIOVERTER DEFIBRILLATOR UPGRADE N/A 11/04/2014   Initial ICD implanted in [place] by Dr Tana Coast.  Upgraded to MDT BiV ICD by Dr Caryl Comes for bradycardia dependant PMVT  . CARDIAC CATHETERIZATION  04/07/2012  . CORONARY ARTERY BYPASS GRAFT  ~ 2009   CABG X1  .  goiter removed    . LEFT AND RIGHT HEART CATHETERIZATION WITH CORONARY/GRAFT ANGIOGRAM N/A 04/07/2012   Procedure: LEFT AND RIGHT HEART CATHETERIZATION WITH Beatrix Fetters;  Surgeon: Thayer Headings, MD;  Location: [facility] CATH LAB;  Service: Cardiovascular;  Laterality: N/A;  . PERCUTANEOUS CORONARY STENT INTERVENTION (PCI-S) N/A 04/11/2012   Procedure: PERCUTANEOUS CORONARY STENT INTERVENTION (PCI-S);  Surgeon: Hillary Bow, MD;  Location: [facility] CATH LAB;  Service: Cardiovascular;  Laterality: N/A;    No family history on file.  Social History   Tobacco Use  . Smoking status: Never Smoker  . Smokeless tobacco: Never Used  Substance Use Topics  . Alcohol use: No    Alcohol/week: 0.0 standard drinks  . Drug use: No    Medications: I have reviewed the patient's current medications.  No Known Allergies   ROS:  Pertinent items are noted in HPI.  Blood pressure 125/68, pulse 80, temperature (!) 97.5 F (36.4 C), temperature source Axillary, resp. rate 16, height 5\' 4"  (1.626 m), weight 71.3 kg, SpO2 98 %. Physical Exam: General: NAD, somnolent but responsive to questioning Cardiovascular: RRR, S1, S2 normal, no murmur, rub, click, or gallop Respiratory: clear to auscultation bilaterally Integumentary: Multiple wounds on left and right lateral hip. Sacral blister.  Results: Results for orders placed or performed during the hospital encounter of 10/23/2019 (from the past 48 hour(s))  Wound or Superficial Culture     Status: None (Preliminary result)   Collection Time: 11/01/2019  2:28 PM   Specimen: Wound  Result Value Ref Range   Specimen Description      WOUND  RIGHT ILIAC Performed at [facility], [address], [place], [place] [address]    Special Requests      Normal Performed at [facility], [address] [place], [place] [address]    Gram Stain      RARE WBC PRESENT, PREDOMINANTLY PMN ABUNDANT GRAM NEGATIVE RODS FEW GRAM POSITIVE COCCI RARE GRAM POSITIVE  RODS Performed at [facility], [address], [place], [place] [address]    Culture PENDING    Report Status PENDING   Lactic acid, plasma     Status: Abnormal   Collection Time: October 18, 2019  3:15 PM  Result Value Ref Range   Lactic Acid, Venous 3.1 (HH) 0.5 - 1.9 mmol/L    Comment: CRITICAL RESULT CALLED TO, READ BACK BY AND VERIFIED WITH: HOLCOM,R AT 1655 ON 10-18-2019 BY ISLEY,B Performed at [facility], [address] [place], [place] [address]   Comprehensive metabolic panel     Status: Abnormal   Collection Time: 2019/10/18  3:15 PM  Result Value Ref Range   Sodium 147 (H) 135 - 145 mmol/L   Potassium 4.4 3.5 - 5.1 mmol/L   Chloride 112 (H) 98 - 111 mmol/L   CO2 24 22 - 32 mmol/L   Glucose, Bld 195 (H) 70 - 99 mg/dL    Comment: Glucose reference range applies only to samples taken after fasting for at least 8 hours.   BUN 54 (H) 8 - 23 mg/dL   Creatinine, Ser 8.24 (H) 0.44 - 1.00 mg/dL   Calcium 7.9 (L) 8.9 - 10.3 mg/dL   Total Protein 6.6 6.5 - 8.1 g/dL   Albumin 2.7 (L) 3.5 - 5.0 g/dL   AST 20 15 - 41 U/L   ALT 13 0 - 44 U/L   Alkaline Phosphatase 81 38 - 126 U/L   Total Bilirubin 0.8 0.3 - 1.2 mg/dL   GFR calc non Af Amer 25 (L) >60 mL/min   GFR calc Af Amer 29 (L) >60 mL/min   Anion gap 11 5 - 15    Comment: Performed at [facility], [address] [place], [place] [address]  CBC WITH DIFFERENTIAL     Status: Abnormal   Collection Time: 2019/10/18  3:15 PM  Result Value Ref Range   WBC 21.7 (H) 4.0 - 10.5 K/uL   RBC 2.88 (L) 3.87 - 5.11 MIL/uL   Hemoglobin 8.6 (L) 12.0 - 15.0 g/dL   HCT 14.4 (L) 31.5 - 40.0 %   MCV 100.3 (H) 80.0 - 100.0 fL   MCH 29.9 26.0 - 34.0 pg   MCHC 29.8 (L) 30.0 - 36.0 g/dL   RDW 86.7 61.9 - 50.9 %   Platelets 299 150 - 400 K/uL   nRBC 0.1 0.0 - 0.2 %   Neutrophils Relative % 91 %   Neutro Abs 19.7 (H) 1.7 - 7.7 K/uL   Lymphocytes Relative 4 %   Lymphs Abs 0.8 0.7 - 4.0 K/uL   Monocytes Relative 3 %   Monocytes Absolute 0.7 0.1 -  1.0 K/uL   Eosinophils Relative 0 %   Eosinophils Absolute 0.0 0.0 - 0.5 K/uL   Basophils Relative 0 %   Basophils Absolute 0.0 0.0 - 0.1 K/uL   Immature Granulocytes 2 %   Abs Immature Granulocytes 0.44 (H) 0.00 - 0.07 K/uL    Comment: Performed at [facility], [address], [place], [place] [address]  APTT     Status: None   Collection Time: 10/18/19  3:15 PM  Result Value Ref Range   aPTT 33 24 - 36 seconds    Comment: Performed at [facility], [address] [place], [place] [address]  Protime-INR     Status: None   Collection Time: 10/30/2019  3:15 PM  Result Value Ref Range   Prothrombin Time 14.0 11.4 - 15.2 seconds   INR 1.1 0.8 - 1.2    Comment: (NOTE) INR goal varies based on device and disease states. Performed at [facility], [address] [place], [place] [address]   Blood Culture (routine x 2)     Status: None (Preliminary result)   Collection Time: 10/29/2019  3:15 PM   Specimen: Right Antecubital; Blood  Result Value Ref Range   Specimen Description RIGHT ANTECUBITAL    Special Requests      BOTTLES DRAWN AEROBIC AND ANAEROBIC Blood Culture adequate volume   Culture      NO GROWTH < 24 HOURS Performed at [facility], [address] [place], [place] [address]    Report Status PENDING   Respiratory Panel by RT PCR (Flu A&B, Covid) - Nasopharyngeal Swab     Status: None   Collection Time: 10/28/2019  5:00 PM   Specimen: Nasopharyngeal Swab  Result Value Ref Range   SARS Coronavirus 2 by RT PCR NEGATIVE NEGATIVE    Comment: (NOTE) SARS-CoV-2 target nucleic acids are NOT DETECTED. The SARS-CoV-2 RNA is generally detectable in upper respiratoy specimens during the acute phase of infection. The lowest concentration of SARS-CoV-2 viral copies this assay can detect is 131 copies/mL. A negative result does not preclude SARS-Cov-2 infection and should not be used as the sole basis for treatment or other patient management decisions. A negative result may occur with   improper specimen collection/handling, submission of specimen other than nasopharyngeal swab, presence of viral mutation(s) within the areas targeted by this assay, and inadequate number of viral copies (<131 copies/mL). A negative result must be combined with clinical observations, patient history, and epidemiological information. The expected result is Negative. Fact Sheet for Patients:  https://www.moore.com/ Fact Sheet for Healthcare Providers:  https://www.young.biz/ This test is not yet ap proved or cleared by the [place] FDA and  has been authorized for detection and/or diagnosis of SARS-CoV-2 by FDA under an Emergency Use Authorization (EUA). This EUA will remain  in effect (meaning this test can be used) for the duration of the COVID-19 declaration under Section 564(b)(1) of the Act, 21 U.S.C. section 360bbb-3(b)(1), unless the authorization is terminated or revoked sooner.    Influenza A by PCR NEGATIVE NEGATIVE   Influenza B by PCR NEGATIVE NEGATIVE    Comment: (NOTE) The Xpert Xpress SARS-CoV-2/FLU/RSV assay is intended as an aid in  the diagnosis of influenza from Nasopharyngeal swab specimens and  should not be used as a sole basis for treatment. Nasal washings and  aspirates are unacceptable for Xpert Xpress SARS-CoV-2/FLU/RSV  testing. Fact Sheet for Patients: https://www.moore.com/ Fact Sheet for Healthcare Providers: https://www.young.biz/ This test is not yet approved or cleared by the [place] FDA and  has been authorized for detection and/or diagnosis of SARS-CoV-2 by  FDA under an Emergency Use Authorization (EUA). This EUA will remain  in effect (meaning this test can be used) for the duration of the  Covid-19 declaration under Section 564(b)(1) of the Act, 21  U.S.C. section 360bbb-3(b)(1), unless the authorization is  terminated or revoked. Performed at [facility], [address] [place], [place] [address]   Blood Culture (routine x  2)     Status: None (Preliminary result)   Collection Time: November 03, 2019  5:13 PM   Specimen: Left Antecubital; Blood  Result Value Ref Range   Specimen Description LEFT ANTECUBITAL    Special Requests      BOTTLES DRAWN AEROBIC ONLY Blood Culture results may not be optimal due to an inadequate volume of blood received in culture bottles   Culture      NO GROWTH < 24 HOURS Performed at [facility], [address] [place], [place] [address]    Report Status PENDING   Lactic acid, plasma     Status: Abnormal   Collection Time: November 03, 2019  5:40 PM  Result Value Ref Range   Lactic Acid, Venous 2.3 (HH) 0.5 - 1.9 mmol/L    Comment: CRITICAL RESULT CALLED TO, READ BACK BY AND VERIFIED WITH: MINTER,R AT 1826 ON 3.9.21 BY ISLEY,B Performed at [facility], [address] [place], [place] [address]   Magnesium     Status: None   Collection Time: November 03, 2019  5:40 PM  Result Value Ref Range   Magnesium 2.4 1.7 - 2.4 mg/dL    Comment: Performed at [facility], [address] [place], [place] [address]  Basic metabolic panel     Status: Abnormal   Collection Time: 10/17/19  6:07 AM  Result Value Ref Range   Sodium 148 (H) 135 - 145 mmol/L   Potassium 4.3 3.5 - 5.1 mmol/L   Chloride 116 (H) 98 - 111 mmol/L   CO2 20 (L) 22 - 32 mmol/L   Glucose, Bld 166 (H) 70 - 99 mg/dL    Comment: Glucose reference range applies only to samples taken after fasting for at least 8 hours.   BUN 54 (H) 8 - 23 mg/dL   Creatinine, Ser 9.141.67 (H) 0.44 - 1.00 mg/dL   Calcium 7.6 (L) 8.9 - 10.3 mg/dL   GFR calc non Af Amer 28 (L) >60 mL/min   GFR calc Af Amer 33 (L) >60 mL/min   Anion gap 12 5 - 15    Comment: Performed at [facility], [address] [place], [place] [address]  CBC     Status: Abnormal   Collection Time: 10/17/19  6:07 AM  Result Value Ref Range   WBC 25.6 (H) 4.0 - 10.5 K/uL   RBC 3.11 (L) 3.87 - 5.11 MIL/uL   Hemoglobin 9.4  (L) 12.0 - 15.0 g/dL   HCT 62.132.6 (L) 30.836.0 - 65.746.0 %   MCV 104.8 (H) 80.0 - 100.0 fL   MCH 30.2 26.0 - 34.0 pg   MCHC 28.8 (L) 30.0 - 36.0 g/dL   RDW 84.614.5 96.211.5 - 95.215.5 %   Platelets 260 150 - 400 K/uL   nRBC 0.0 0.0 - 0.2 %    Comment: Performed at [facility], [address] [place], [place] [address]  Vitamin B12     Status: None   Collection Time: 10/17/19  6:07 AM  Result Value Ref Range   Vitamin B-12 199 180 - 914 pg/mL    Comment: (NOTE) This assay is not validated for testing neonatal or myeloproliferative syndrome specimens for Vitamin B12 levels. Performed at [facility], [address] [place], [place] [address]   Glucose, capillary     Status: Abnormal   Collection Time: 10/17/19  6:11 AM  Result Value Ref Range   Glucose-Capillary 153 (H) 70 - 99 mg/dL    Comment: Glucose reference range applies only to samples taken after fasting for  at least 8 hours.  Glucose, capillary     Status: Abnormal   Collection Time: 10/17/19  7:31 AM  Result Value Ref Range   Glucose-Capillary 134 (H) 70 - 99 mg/dL    Comment: Glucose reference range applies only to samples taken after fasting for at least 8 hours.    CT ABDOMEN PELVIS WO CONTRAST  Result Date: 2019/11/06 CLINICAL DATA:  Decubitus ulcers EXAM: CT ABDOMEN AND PELVIS WITHOUT CONTRAST TECHNIQUE: Multidetector CT imaging of the abdomen and pelvis was performed following the standard protocol without IV contrast. COMPARISON:  None. FINDINGS: Lower chest: No acute abnormality. Hepatobiliary: Scattered calcifications are noted within the liver. Gallbladder is well distended with multiple dependent gallstones. Pancreas: Unremarkable. No pancreatic ductal dilatation or surrounding inflammatory changes. Spleen: Normal in size without focal abnormality. Adrenals/Urinary Tract: Adrenal glands are within normal limits. Kidneys are well visualized bilaterally with renal vascular calcifications. No renal stones are seen. No obstructive changes  are noted. The bladder is partially distended. Stomach/Bowel: The appendix is not well visualized although no inflammatory changes to suggest appendicitis are noted. Retained fecal material is noted throughout the colon consistent with a mild degree of constipation without obstructive change. No small bowel abnormality is seen. Stomach is unremarkable. Vascular/Lymphatic: Aortic atherosclerosis. No enlarged abdominal or pelvic lymph nodes. Reproductive: Uterus and bilateral adnexa are unremarkable. Other: No abdominal wall hernia or abnormality. No abdominopelvic ascites. Musculoskeletal: Mild changes of anasarca are noted. Decubitus ulcer is noted laterally near the right hip with inflammatory changes extending from the skin towards the greater trochanter. This area measures approximately 7.6 x 4.3 cm. No definitive bony erosive changes to suggest osteomyelitis are noted at this time. No other definitive decubitus ulcer is seen. Degenerative changes of lumbar spine are seen. IMPRESSION: Right lateral decubitus ulcer with subcutaneous inflammatory changes extending towards the greater trochanter of the right proximal femur. No definitive bony destructive changes are seen. Considerable subcutaneous inflammatory changes noted with air tracking towards the lateral aspect of the femur. Changes of mild constipation. Cholelithiasis without complicating factors. Electronically Signed   By: Alcide Clever M.D.   On: 11/06/2019 16:51   DG Chest Port 1 View  Result Date: November 06, 2019 CLINICAL DATA:  Sepsis EXAM: PORTABLE CHEST 1 VIEW COMPARISON:  11/05/2014 FINDINGS: Stable positioning of a left-sided implanted cardiac device. Post CABG changes. Stable cardiomediastinal contours. No focal airspace consolidation, pleural effusion, or pneumothorax. IMPRESSION: No acute cardiopulmonary findings. Electronically Signed   By: Duanne Guess D.O.   On: 11/06/2019 14:47     Assessment & Plan:  [name] is a 85 y.o. female  with a 2 month history of decubitus ulcers presented to the emergency room yesterday.  -Impression: The patient has multiple decubitus ulcers on the right and left hips. CT imaging indicated extensive inflammation of a right lateral hip ulcer that extends towards the greater trochanter of the right proximal femur.   -Plan: Given the extensive inflammation and necrosis of the tissue underlying the ulceration, surgical debridement is our best course of action. Surgical debridement will be performed later today.     Eliezer Lofts 10/17/2019, 10:52 AM

## 2019-10-17 NOTE — Progress Notes (Signed)
PROGRESS NOTE  [name] HQI:[id number] DOB: 1935/05/30 DOA: Oct 24, 2019 PCP: [name], MD  Brief History:  85 year old female with a history of Alzheimer's dementia, coronary artery disease, systolic CHF with EF 20 to 25%, torsades VT status post AICD, hypertension, diabetes mellitus type 2, hyperlipidemia presenting with increasing somnolence and poor oral intake.  The patient is unable to provide any significant history secondary to her dementia.  History is supplemented by the patient's spouse at the bedside.  He states that over the past month he has noticed worsening decubitus ulcers for which he has been caring at home.  He is the primary caregiver.  She has not had fevers, chills, chest pain, shortness of breath, nausea, vomiting, diarrhea.  However over the past month he has noticed increasing somnolence and decreased oral intake as well as increasing pain.  In addition, the spouse has noted a worsening functional and cognitive decline over the last 4 months.  She has been essentially bedbound for at least the last 4 to 5 months. The patient was afebrile hemodynamically stable with oxygen saturation 100%.  Lactic acid was 3.1 with WBC 21.7.  The patient was started on IV antibiotics.  Assessment/Plan: Sepsis -Present on admission -Secondary to infected decubitus ulcers -Lactic acid 3.1 -Continue empiric vancomycin, cefepime, metronidazole  Infected decubitus ulcers -October 24, 2019 CT abdomen/pelvis--right lateral decubitus ulcer with subcutaneous inflammatory changes extending toward the greater trochanter.  There is no bony destructive changes.  There is considerable inflammatory change with air tracking toward the lateral aspect of the femur. -General surgery consult -Continue empiric antibiotics  Failure to thrive -Serum B12 -TSH -Folate -PT evaluation  Hyponatremia -Discontinue half-normal saline -Start D5W  AKI -due to sepsis and hemodynamic changes  Chronic  systolic CHF -02/2015 echo EF 20-25%, G2DD -Judicious IV fluids  Alzheimer's dementia -Continue Namenda  Coronary artery disease -hx of CABG in 2009 (LIMA-LAD, SVG-ramus, SVG-OM). Hx of RCA stent 04/2012 -no chest pain presently -Continue aspirin and Plavix  Ventricular Tachycardia - she has CRT-D device followed by EP - she underwent ICD upgrade of single chamber ICD to [employer] CRT-D  Hyperlipidemia -continue statin  HTN -holding coreg/hydralazine due to soft BPs      Disposition Plan: Patient From: Home/SNF D/C Place: Home/SNF - 2-3  Days when sepsis and wounds improve Barriers: Not Clinically Stable--  Family Communication:   Spouse updated at bedside 3/1  Consultants:  General surgery; palliative medicine  Code Status:  DNR  DVT Prophylaxis:  [place] Heparin    Procedures: As Listed in Progress Note Above  Antibiotics: vanc 3/9>> Cefepime 3/9>> Metronidazole 3/9>>      Subjective: Patient denies fevers, chills, headache, chest pain, dyspnea, nausea, vomiting, diarrhea, abdominal pain.  ROS limited due to dementia   Objective: Vitals:   10/17/19 0251 10/17/19 0542 10/17/19 0600 10/17/19 0808  BP: (!) 109/56 125/68    Pulse:  80    Resp:  16    Temp:      TempSrc:      SpO2:   100% 98%  Weight:      Height: 5\' 4"  (1.626 m)      No intake or output data in the 24 hours ending 10/17/19 1307 Weight change:  Exam:   General:  Pt is alert, follows commands appropriately, not in acute distress  HEENT: No icterus, No thrush, No neck mass, [place]/AT  Cardiovascular: RRR, S1/S2, no rubs, no gallops  Respiratory: bibasilar rales  Abdomen: Soft/+BS, non tender, non distended, no guarding  Extremities: 2 + LE edema, No lymphangitis, No petechiae, No rashes, no synovitis  Right and left hip wounds unstageable.  Right hip wound with malodorous, purulent drainage   Data Reviewed: I have personally reviewed following labs and imaging studies Basic  Metabolic Panel: Recent Labs  Lab 10-18-2019 1515 10-18-2019 1740 10/17/19 0607  NA 147*  --  148*  K 4.4  --  4.3  CL 112*  --  116*  CO2 24  --  20*  GLUCOSE 195*  --  166*  BUN 54*  --  54*  CREATININE 1.83*  --  1.67*  CALCIUM 7.9*  --  7.6*  MG  --  2.4  --    Liver Function Tests: Recent Labs  Lab Oct 18, 2019 1515  AST 20  ALT 13  ALKPHOS 81  BILITOT 0.8  PROT 6.6  ALBUMIN 2.7*   No results for input(s): LIPASE, AMYLASE in the last 168 hours. No results for input(s): AMMONIA in the last 168 hours. Coagulation Profile: Recent Labs  Lab 2019-10-18 1515  INR 1.1   CBC: Recent Labs  Lab 10-18-19 1515 10/17/19 0607  WBC 21.7* 25.6*  NEUTROABS 19.7*  --   HGB 8.6* 9.4*  HCT 28.9* 32.6*  MCV 100.3* 104.8*  PLT 299 260   Cardiac Enzymes: No results for input(s): CKTOTAL, CKMB, CKMBINDEX, TROPONINI in the last 168 hours. BNP: Invalid input(s): POCBNP CBG: Recent Labs  Lab 10/17/19 0611 10/17/19 0731  GLUCAP 153* 134*   HbA1C: No results for input(s): HGBA1C in the last 72 hours. Urine analysis: No results found for: COLORURINE, APPEARANCEUR, LABSPEC, PHURINE, GLUCOSEU, HGBUR, BILIRUBINUR, KETONESUR, PROTEINUR, UROBILINOGEN, NITRITE, LEUKOCYTESUR Sepsis Labs: @LABRCNTIP (procalcitonin:4,lacticidven:4) ) Recent Results (from the past 240 hour(s))  Wound or Superficial Culture     Status: None (Preliminary result)   Collection Time: 10/18/19  2:28 PM   Specimen: Wound  Result Value Ref Range Status   Specimen Description   Final    WOUND RIGHT ILIAC Performed at [facility], [address], [place], [place]    Special Requests   Final    Normal Performed at [facility], [address], [place], [place]    Gram Stain   Final    RARE WBC PRESENT, PREDOMINANTLY PMN ABUNDANT GRAM NEGATIVE RODS FEW GRAM POSITIVE COCCI RARE GRAM POSITIVE RODS    Culture   Final    ABUNDANT GRAM NEGATIVE RODS CULTURE REINCUBATED FOR BETTER  GROWTH Performed at [facility], [address] [place], [place]    Report Status PENDING  Incomplete  Blood Culture (routine x 2)     Status: None (Preliminary result)   Collection Time: 10/18/19  3:15 PM   Specimen: Right Antecubital; Blood  Result Value Ref Range Status   Specimen Description RIGHT ANTECUBITAL  Final   Special Requests   Final    BOTTLES DRAWN AEROBIC AND ANAEROBIC Blood Culture adequate volume   Culture   Final    NO GROWTH < 24 HOURS Performed at [facility], [address], [place], [place]    Report Status PENDING  Incomplete  Respiratory Panel by RT PCR (Flu A&B, Covid) - Nasopharyngeal Swab     Status: None   Collection Time: 10/18/19  5:00 PM   Specimen: Nasopharyngeal Swab  Result Value Ref Range Status   SARS Coronavirus 2 by RT PCR NEGATIVE NEGATIVE Final    Comment: (NOTE) SARS-CoV-2  target nucleic acids are NOT DETECTED. The SARS-CoV-2 RNA is generally detectable in upper respiratoy specimens during the acute phase of infection. The lowest concentration of SARS-CoV-2 viral copies this assay can detect is 131 copies/mL. A negative result does not preclude SARS-Cov-2 infection and should not be used as the sole basis for treatment or other patient management decisions. A negative result may occur with  improper specimen collection/handling, submission of specimen other than nasopharyngeal swab, presence of viral mutation(s) within the areas targeted by this assay, and inadequate number of viral copies (<131 copies/mL). A negative result must be combined with clinical observations, patient history, and epidemiological information. The expected result is Negative. Fact Sheet for Patients:  PinkCheek.be Fact Sheet for Healthcare Providers:  GravelBags.it This test is not yet ap proved or cleared by the [place] FDA and  has been authorized for detection and/or  diagnosis of SARS-CoV-2 by FDA under an Emergency Use Authorization (EUA). This EUA will remain  in effect (meaning this test can be used) for the duration of the COVID-19 declaration under Section 564(b)(1) of the Act, 21 U.S.C. section 360bbb-3(b)(1), unless the authorization is terminated or revoked sooner.    Influenza A by PCR NEGATIVE NEGATIVE Final   Influenza B by PCR NEGATIVE NEGATIVE Final    Comment: (NOTE) The Xpert Xpress SARS-CoV-2/FLU/RSV assay is intended as an aid in  the diagnosis of influenza from Nasopharyngeal swab specimens and  should not be used as a sole basis for treatment. Nasal washings and  aspirates are unacceptable for Xpert Xpress SARS-CoV-2/FLU/RSV  testing. Fact Sheet for Patients: PinkCheek.be Fact Sheet for Healthcare Providers: GravelBags.it This test is not yet approved or cleared by the [place] FDA and  has been authorized for detection and/or diagnosis of SARS-CoV-2 by  FDA under an Emergency Use Authorization (EUA). This EUA will remain  in effect (meaning this test can be used) for the duration of the  Covid-19 declaration under Section 564(b)(1) of the Act, 21  U.S.C. section 360bbb-3(b)(1), unless the authorization is  terminated or revoked. Performed at [facility], [address] [place], [place] [address]   Blood Culture (routine x 2)     Status: None (Preliminary result)   Collection Time: 06-Nov-2019  5:13 PM   Specimen: Left Antecubital; Blood  Result Value Ref Range Status   Specimen Description LEFT ANTECUBITAL  Final   Special Requests   Final    BOTTLES DRAWN AEROBIC ONLY Blood Culture results may not be optimal due to an inadequate volume of blood received in culture bottles   Culture   Final    NO GROWTH < 24 HOURS Performed at [facility], [address], [place], [place] [address]    Report Status PENDING  Incomplete     Scheduled Meds: . aspirin EC  81 mg  Oral Daily  . atorvastatin  80 mg Oral Daily  . clopidogrel  75 mg Oral Daily  . collagenase   Topical Daily  . heparin injection (subcutaneous)  5,000 Units Subcutaneous Q8H  . levothyroxine  25 mcg Oral QAC breakfast  . memantine  10 mg Oral BID   Continuous Infusions: . ceFEPime (MAXIPIME) IV Stopped (10/17/19 0941)  . dextrose    . metronidazole 500 mg (10/17/19 0937)  . vancomycin      Procedures/Studies: CT ABDOMEN PELVIS WO CONTRAST  Result Date: 11-06-19 CLINICAL DATA:  Decubitus ulcers EXAM: CT ABDOMEN AND PELVIS WITHOUT CONTRAST TECHNIQUE: Multidetector CT imaging of the abdomen and pelvis was  performed following the standard protocol without IV contrast. COMPARISON:  None. FINDINGS: Lower chest: No acute abnormality. Hepatobiliary: Scattered calcifications are noted within the liver. Gallbladder is well distended with multiple dependent gallstones. Pancreas: Unremarkable. No pancreatic ductal dilatation or surrounding inflammatory changes. Spleen: Normal in size without focal abnormality. Adrenals/Urinary Tract: Adrenal glands are within normal limits. Kidneys are well visualized bilaterally with renal vascular calcifications. No renal stones are seen. No obstructive changes are noted. The bladder is partially distended. Stomach/Bowel: The appendix is not well visualized although no inflammatory changes to suggest appendicitis are noted. Retained fecal material is noted throughout the colon consistent with a mild degree of constipation without obstructive change. No small bowel abnormality is seen. Stomach is unremarkable. Vascular/Lymphatic: Aortic atherosclerosis. No enlarged abdominal or pelvic lymph nodes. Reproductive: Uterus and bilateral adnexa are unremarkable. Other: No abdominal wall hernia or abnormality. No abdominopelvic ascites. Musculoskeletal: Mild changes of anasarca are noted. Decubitus ulcer is noted laterally near the right hip with inflammatory changes extending  from the skin towards the greater trochanter. This area measures approximately 7.6 x 4.3 cm. No definitive bony erosive changes to suggest osteomyelitis are noted at this time. No other definitive decubitus ulcer is seen. Degenerative changes of lumbar spine are seen. IMPRESSION: Right lateral decubitus ulcer with subcutaneous inflammatory changes extending towards the greater trochanter of the right proximal femur. No definitive bony destructive changes are seen. Considerable subcutaneous inflammatory changes noted with air tracking towards the lateral aspect of the femur. Changes of mild constipation. Cholelithiasis without complicating factors. Electronically Signed   By: Alcide Clever M.D.   On: 10/21/2019 16:51   DG Chest Port 1 View  Result Date: 10/28/2019 CLINICAL DATA:  Sepsis EXAM: PORTABLE CHEST 1 VIEW COMPARISON:  11/05/2014 FINDINGS: Stable positioning of a left-sided implanted cardiac device. Post CABG changes. Stable cardiomediastinal contours. No focal airspace consolidation, pleural effusion, or pneumothorax. IMPRESSION: No acute cardiopulmonary findings. Electronically Signed   By: Duanne Guess D.O.   On: 11/01/2019 14:47    Catarina Hartshorn, DO  [facility]  If 7PM-7AM, please contact night-coverage [email or web address] Password [email or web address] 10/17/2019, 1:07 PM   LOS: 1 day

## 2019-10-17 NOTE — Consult Note (Signed)
WOC Nurse Consult Note: Reason for Consult: Consult requested for left and right buttock/hips.  Performed remotely after review of progress notes and photos in the EMR.  Wound type: Left hip/buttocks areas with 2 unstageable pressure injuries; noted to have mod amt tan drainage, 100% slough Right buttock/hip is much larger in size; 20% red, 80% slough, unstageable pressure injury; noted to have mod amt tan drainage. Pressure Injury POA: Yes Dressing procedure/placement/frequency: If aggressive plan of care is desired, then recommend surgical consult for possible sharp debridement of nonviable tissue.  Topical treatment orders provided for bedside nurses to perform daily as follows to assist with enzymatic debridement of nonviable tissue: Apply Santyl to left and right buttock/hip wounds Q day, then cover with moist gauze and ABD pads and tape. Please re-consult if further assistance is needed.  Thank-you,  Cammie Mcgee MSN, RN, CWOCN, [place], CNS [phone]

## 2019-10-18 ENCOUNTER — Inpatient Hospital Stay (HOSPITAL_COMMUNITY): Payer: Medicare Other

## 2019-10-18 ENCOUNTER — Encounter (HOSPITAL_COMMUNITY): Payer: Self-pay | Admitting: Internal Medicine

## 2019-10-18 DIAGNOSIS — Z7189 Other specified counseling: Secondary | ICD-10-CM

## 2019-10-18 DIAGNOSIS — Z515 Encounter for palliative care: Secondary | ICD-10-CM

## 2019-10-18 DIAGNOSIS — R7881 Bacteremia: Secondary | ICD-10-CM | POA: Diagnosis present

## 2019-10-18 DIAGNOSIS — N179 Acute kidney failure, unspecified: Secondary | ICD-10-CM | POA: Diagnosis present

## 2019-10-18 DIAGNOSIS — A4150 Gram-negative sepsis, unspecified: Secondary | ICD-10-CM | POA: Diagnosis present

## 2019-10-18 LAB — GLUCOSE, CAPILLARY
Glucose-Capillary: 163 mg/dL — ABNORMAL HIGH (ref 70–99)
Glucose-Capillary: 179 mg/dL — ABNORMAL HIGH (ref 70–99)
Glucose-Capillary: 172 mg/dL — ABNORMAL HIGH (ref 70–99)
Glucose-Capillary: 154 mg/dL — ABNORMAL HIGH (ref 70–99)
Glucose-Capillary: 171 mg/dL — ABNORMAL HIGH (ref 70–99)

## 2019-10-18 LAB — CBC
HCT: 28.6 % — ABNORMAL LOW (ref 36.0–46.0)
Hemoglobin: 8.2 g/dL — ABNORMAL LOW (ref 12.0–15.0)
MCH: 29.4 pg (ref 26.0–34.0)
MCHC: 28.7 g/dL — ABNORMAL LOW (ref 30.0–36.0)
MCV: 102.5 fL — ABNORMAL HIGH (ref 80.0–100.0)
Platelets: 226 10*3/uL (ref 150–400)
RBC: 2.79 MIL/uL — ABNORMAL LOW (ref 3.87–5.11)
RDW: 14.6 % (ref 11.5–15.5)
WBC: 24.5 10*3/uL — ABNORMAL HIGH (ref 4.0–10.5)
nRBC: 0.1 % (ref 0.0–0.2)

## 2019-10-18 LAB — BASIC METABOLIC PANEL
Anion gap: 10 (ref 5–15)
BUN: 56 mg/dL — ABNORMAL HIGH (ref 8–23)
CO2: 20 mmol/L — ABNORMAL LOW (ref 22–32)
Calcium: 7.1 mg/dL — ABNORMAL LOW (ref 8.9–10.3)
Chloride: 112 mmol/L — ABNORMAL HIGH (ref 98–111)
Creatinine, Ser: 1.81 mg/dL — ABNORMAL HIGH (ref 0.44–1.00)
GFR calc Af Amer: 30 mL/min — ABNORMAL LOW (ref >60)
GFR calc non Af Amer: 26 mL/min — ABNORMAL LOW (ref >60)
Glucose, Bld: 188 mg/dL — ABNORMAL HIGH (ref 70–99)
Potassium: 4.1 mmol/L (ref 3.5–5.1)
Sodium: 142 mmol/L (ref 135–145)

## 2019-10-18 LAB — MAGNESIUM: Magnesium: 2.3 mg/dL (ref 1.7–2.4)

## 2019-10-18 LAB — FOLATE RBC
Folate, Hemolysate: 242 ng/mL
Folate, RBC: 867 ng/mL (ref >498)
Hematocrit: 27.9 % — ABNORMAL LOW (ref 34.0–46.6)

## 2019-10-18 LAB — TSH: TSH: 37.364 u[IU]/mL — ABNORMAL HIGH (ref 0.350–4.500)

## 2019-10-18 LAB — T4, FREE: Free T4: 0.28 ng/dL — ABNORMAL LOW (ref 0.61–1.12)

## 2019-10-18 LAB — LACTIC ACID, PLASMA: Lactic Acid, Venous: 2.3 mmol/L (ref 0.5–1.9)

## 2019-10-18 MED ORDER — FENTANYL CITRATE (PF) 100 MCG/2ML IJ SOLN
25.0000 ug | Freq: Once | INTRAMUSCULAR | Status: AC
  Administered 2019-10-18: 25 ug via INTRAVENOUS
  Filled 2019-10-18: qty 2

## 2019-10-18 MED ORDER — CYANOCOBALAMIN 1000 MCG/ML IJ SOLN
1000.0000 ug | Freq: Once | INTRAMUSCULAR | Status: AC
  Administered 2019-10-18: 1000 ug via INTRAMUSCULAR
  Filled 2019-10-18: qty 1

## 2019-10-18 MED ORDER — ONDANSETRON HCL 4 MG/2ML IJ SOLN: 4.0000 mg | Freq: Four times a day (QID) | INTRAMUSCULAR | Status: DC | PRN

## 2019-10-18 MED ORDER — LEVOTHYROXINE SODIUM 75 MCG PO TABS
75.0000 ug | ORAL_TABLET | Freq: Every day | ORAL | Status: DC
  Administered 2019-10-19: 05:00:00 75 ug via ORAL
  Filled 2019-10-18: qty 1
  Filled 2019-10-18: qty 3

## 2019-10-18 MED ORDER — FOLIC ACID 1 MG PO TABS
1.0000 mg | ORAL_TABLET | Freq: Every day | ORAL | Status: DC
  Administered 2019-10-18 – 2019-10-20 (×3): 1 mg via ORAL
  Filled 2019-10-18 (×5): qty 1

## 2019-10-18 MED ORDER — SODIUM CHLORIDE 0.9 % IV BOLUS
1000.0000 mL | Freq: Once | INTRAVENOUS | Status: AC
  Administered 2019-10-18: 15:00:00 1000 mL via INTRAVENOUS

## 2019-10-18 MED ORDER — VITAMIN B-12 100 MCG PO TABS
500.0000 ug | ORAL_TABLET | Freq: Every day | ORAL | Status: DC
  Administered 2019-10-19 – 2019-10-20 (×2): 500 ug via ORAL
  Filled 2019-10-18 (×2): qty 1
  Filled 2019-10-18 (×2): qty 5

## 2019-10-18 NOTE — Progress Notes (Signed)
CRITICAL VALUE ALERT  Critical Value:  Aerobic blood culture gram pos cocci  Date & Time Notied:  10/18/19 3:31 AM   Provider Notified: Welton Flakes  Orders Received/Actions taken: none

## 2019-10-18 NOTE — Progress Notes (Signed)
No noted output recorded, notified Dr. Welton Flakes of bladder scan results. Order to in and out cath. 60 ml urine returned. No other orders at this time.

## 2019-10-18 NOTE — Progress Notes (Signed)
PROGRESS NOTE  [name] NFA:[id number] DOB: May 04, 1935 DOA: 10/10/2019 PCP: [name], MD  Brief History:  85 year old female with a history of Alzheimer's dementia, coronary artery disease, systolic CHF with EF 20 to 25%, torsades VT status post AICD, hypertension, diabetes mellitus type 2, hyperlipidemia presenting with increasing somnolence and poor oral intake.  The patient is unable to provide any significant history secondary to her dementia.  History is supplemented by the patient's spouse at the bedside.  He states that over the past month he has noticed worsening decubitus ulcers for which he has been caring at home.  He is the primary caregiver.  She has not had fevers, chills, chest pain, shortness of breath, nausea, vomiting, diarrhea.  However over the past month he has noticed increasing somnolence and decreased oral intake as well as increasing pain.  In addition, the spouse has noted a worsening functional and cognitive decline over the last 4 months.  She has been essentially bedbound for at least the last 4 to 5 months. The patient was afebrile hemodynamically stable with oxygen saturation 100%.  Lactic acid was 3.1 with WBC 21.7.  The patient was started on IV antibiotics.  Assessment/Plan: Sepsis -Present on admission -Secondary to infected decubitus ulcers, bacteremia, possible UTI -Lactic acid 3.1 -Continue empiric vancomycin, cefepime, metronidazole -repeat fluid bolus  Bacteremia -10/25/2019 blood culture--GNR, GPC -continue vanc/cefepime pending culture data  Infected decubitus ulcers -11/05/2019 CT abdomen/pelvis--right lateral decubitus ulcer with subcutaneous inflammatory changes extending toward the greater trochanter.  There is no bony destructive changes.  There is considerable inflammatory change with air tracking toward the lateral aspect of the femur. -General surgery consult appreciated -10/17/19--bedside debridement -Continue empiric  antibiotics--follow culture  Pyuria -UA >50wbc -continue empiric abx pending urine culture data  Failure to thrive -Serum B12--199-->supplement -TSH--37.364, Free T4--0.28 -Folate--7.2 -PT evaluation  Hyponatremia -Discontinue half-normal saline -continue D5W  AKI -due to sepsis and hemodynamic changes -pt likely has progression of underlying CKD -renal US-->medical renal disease  Chronic systolic CHF -02/2015 echo EF 20-25%, G2DD -Judicious IV fluids  Alzheimer's dementia -Continue Namenda  Coronary artery disease -hx of CABG in 2009 (LIMA-LAD, SVG-ramus, SVG-OM). Hx of RCA stent 04/2012 -no chest pain presently -Continue aspirin and Plavix  Ventricular Tachycardia - she has CRT-D device followed by EP - she underwent ICD upgrade of single chamber ICD to [employer] CRT-D  Hyperlipidemia -continue statin  HTN -holding coreg/hydralazine due to soft BPs  Bilateral hip and sacral decubiti--unstageable -present on admission -appreciate surgical eval      Disposition Plan: Patient From: Home/SNF D/C Place: Home/SNF - 2-3  Days when sepsis and wounds improve Barriers: Not Clinically Stable--poor po intake, low BPs  Family Communication:   Spouse updated at bedside 3/11  Consultants:  General surgery; palliative medicine  Code Status:  DNR  DVT Prophylaxis:  [place] Heparin    Procedures: As Listed in Progress Note Above  Antibiotics: vanc 3/9>> Cefepime 3/9>> Metronidazole 3/9>>       Subjective: Pt is more awake, but cannot carry on conversation.  Denies cp, sob, vomiting, abd pain.  Remainder ROS unobtainable due to encephalopathy  Objective: Vitals:   10/17/19 0600 10/17/19 0808 10/17/19 1942 10/18/19 0400  BP:   (!) 93/38 (!) 91/59  Pulse:   72 75  Resp:   20 16  Temp:    (!) 97 F (36.1 C)  TempSrc:    Axillary  SpO2: 100% 98% 100% 94%  Weight:      Height:        Intake/Output Summary (Last 24 hours) at  10/18/2019 1418 Last data filed at 10/18/2019 0500 Gross per 24 hour  Intake 1804.36 ml  Output 60 ml  Net 1744.36 ml   Weight change:  Exam:   General:  Pt is alert, follows commands appropriately, not in acute distress  HEENT: No icterus, No thrush, No neck mass, [place]/AT  Cardiovascular: RRR, S1/S2, no rubs, no gallops  Respiratory: poor inspiratory effort, bibasilar crackles.  Abdomen: Soft/+BS, non tender, non distended, no guarding  Extremities: 2+ LE edema, No lymphangitis, No petechiae, No rashes, no synovitis   Data Reviewed: I have personally reviewed following labs and imaging studies Basic Metabolic Panel: Recent Labs  Lab 10/27/2019 1515 11/07/2019 1740 10/17/19 0607 10/18/19 0611  NA 147*  --  148* 142  K 4.4  --  4.3 4.1  CL 112*  --  116* 112*  CO2 24  --  20* 20*  GLUCOSE 195*  --  166* 188*  BUN 54*  --  54* 56*  CREATININE 1.83*  --  1.67* 1.81*  CALCIUM 7.9*  --  7.6* 7.1*  MG  --  2.4  --  2.3   Liver Function Tests: Recent Labs  Lab 10/22/2019 1515  AST 20  ALT 13  ALKPHOS 81  BILITOT 0.8  PROT 6.6  ALBUMIN 2.7*   No results for input(s): LIPASE, AMYLASE in the last 168 hours. No results for input(s): AMMONIA in the last 168 hours. Coagulation Profile: Recent Labs  Lab 11/03/2019 1515  INR 1.1   CBC: Recent Labs  Lab 10/21/2019 1515 10/17/19 0607 10/18/19 0611  WBC 21.7* 25.6* 24.5*  NEUTROABS 19.7*  --   --   HGB 8.6* 9.4* 8.2*  HCT 28.9* 32.6* 28.6*  MCV 100.3* 104.8* 102.5*  PLT 299 260 226   Cardiac Enzymes: No results for input(s): CKTOTAL, CKMB, CKMBINDEX, TROPONINI in the last 168 hours. BNP: Invalid input(s): POCBNP CBG: Recent Labs  Lab 10/17/19 0611 10/17/19 0731 10/18/19 0409 10/18/19 0756 10/18/19 1234  GLUCAP 153* 134* 163* 179* 172*   HbA1C: No results for input(s): HGBA1C in the last 72 hours. Urine analysis:    Component Value Date/Time   COLORURINE AMBER (A) 10/17/2019 1426   APPEARANCEUR CLOUDY (A)  10/17/2019 1426   LABSPEC 1.021 10/17/2019 1426   PHURINE 5.0 10/17/2019 1426   GLUCOSEU NEGATIVE 10/17/2019 1426   HGBUR SMALL (A) 10/17/2019 1426   BILIRUBINUR NEGATIVE 10/17/2019 1426   KETONESUR 5 (A) 10/17/2019 1426   PROTEINUR 30 (A) 10/17/2019 1426   NITRITE NEGATIVE 10/17/2019 1426   LEUKOCYTESUR MODERATE (A) 10/17/2019 1426   Sepsis Labs: @LABRCNTIP (procalcitonin:4,lacticidven:4) ) Recent Results (from the past 240 hour(s))  Wound or Superficial Culture     Status: None (Preliminary result)   Collection Time: 10/15/2019  2:28 PM   Specimen: Wound  Result Value Ref Range Status   Specimen Description   Final    WOUND RIGHT ILIAC Performed at [facility], [address], [place], [place]    Special Requests   Final    Normal Performed at [facility], [address], [place], [place]    Gram Stain   Final    RARE WBC PRESENT, PREDOMINANTLY PMN ABUNDANT GRAM NEGATIVE RODS FEW GRAM POSITIVE COCCI RARE GRAM POSITIVE RODS Performed at [facility], [address], [place], [place]    Culture   Final  ABUNDANT GRAM NEGATIVE RODS ABUNDANT PROTEUS MIRABILIS    Report Status PENDING  Incomplete  Blood Culture (routine x 2)     Status: None (Preliminary result)   Collection Time: 10/08/2019  3:15 PM   Specimen: BLOOD  Result Value Ref Range Status   Specimen Description   Final    BLOOD RIGHT ANTECUBITAL Performed at [facility], [address] [place], [place] [address]    Special Requests   Final    BOTTLES DRAWN AEROBIC AND ANAEROBIC Blood Culture adequate volume Performed at [facility], [address], [place], [place] [address]    Culture  Setup Time   Final    GRAM NEGATIVE RODS Gram Stain Report Called to,Read Back By and Verified With: OLIVER,J ON 10/17/19 AT 1910 BY LOY,C PERFORMED AT [facility] ANAEROBIC BOTTLE GRAM POSITIVE COCCI AEROBIC BOTTLE ONLY Gram Stain Report Called to,Read Back By and Verified With: H  EVANS,[id number]  10/18/19 MKELLY Performed at [facility], [address], [place], [place] [address]    Culture Romie Minus NEGATIVE RODS GRAM POSITIVE COCCI   Final   Report Status PENDING  Incomplete  Blood Culture ID Panel (Reflexed)     Status: None   Collection Time: 10/10/2019  3:15 PM  Result Value Ref Range Status   Enterococcus species NOT DETECTED NOT DETECTED Final   Listeria monocytogenes NOT DETECTED NOT DETECTED Final   Staphylococcus species NOT DETECTED NOT DETECTED Final   Staphylococcus aureus (BCID) NOT DETECTED NOT DETECTED Final   Streptococcus species NOT DETECTED NOT DETECTED Final   Streptococcus agalactiae NOT DETECTED NOT DETECTED Final   Streptococcus pneumoniae NOT DETECTED NOT DETECTED Final   Streptococcus pyogenes NOT DETECTED NOT DETECTED Final   Acinetobacter baumannii NOT DETECTED NOT DETECTED Final   Enterobacteriaceae species NOT DETECTED NOT DETECTED Final   Enterobacter cloacae complex NOT DETECTED NOT DETECTED Final   Escherichia coli NOT DETECTED NOT DETECTED Final   Klebsiella oxytoca NOT DETECTED NOT DETECTED Final   Klebsiella pneumoniae NOT DETECTED NOT DETECTED Final   Proteus species NOT DETECTED NOT DETECTED Final   Serratia marcescens NOT DETECTED NOT DETECTED Final   Haemophilus influenzae NOT DETECTED NOT DETECTED Final   Neisseria meningitidis NOT DETECTED NOT DETECTED Final   Pseudomonas aeruginosa NOT DETECTED NOT DETECTED Final   Candida albicans NOT DETECTED NOT DETECTED Final   Candida glabrata NOT DETECTED NOT DETECTED Final   Candida krusei NOT DETECTED NOT DETECTED Final   Candida parapsilosis NOT DETECTED NOT DETECTED Final   Candida tropicalis NOT DETECTED NOT DETECTED Final    Comment: Performed at [facility], [address] [place], [place] [address]  Respiratory Panel by RT PCR (Flu A&B, Covid) - Nasopharyngeal Swab     Status: None   Collection Time: 10/10/2019  5:00 PM   Specimen: Nasopharyngeal Swab  Result  Value Ref Range Status   SARS Coronavirus 2 by RT PCR NEGATIVE NEGATIVE Final    Comment: (NOTE) SARS-CoV-2 target nucleic acids are NOT DETECTED. The SARS-CoV-2 RNA is generally detectable in upper respiratoy specimens during the acute phase of infection. The lowest concentration of SARS-CoV-2 viral copies this assay can detect is 131 copies/mL. A negative result does not preclude SARS-Cov-2 infection and should not be used as the sole basis for treatment or other patient management decisions. A negative result may occur with  improper specimen collection/handling, submission of specimen other than nasopharyngeal swab, presence of viral mutation(s) within the areas targeted by this assay,  and inadequate number of viral copies (<131 copies/mL). A negative result must be combined with clinical observations, patient history, and epidemiological information. The expected result is Negative. Fact Sheet for Patients:  https://www.moore.com/https://www.fda.gov/media/142436/download Fact Sheet for Healthcare Providers:  https://www.young.biz/https://www.fda.gov/media/142435/download This test is not yet ap proved or cleared by the [place] FDA and  has been authorized for detection and/or diagnosis of SARS-CoV-2 by FDA under an Emergency Use Authorization (EUA). This EUA will remain  in effect (meaning this test can be used) for the duration of the COVID-19 declaration under Section 564(b)(1) of the Act, 21 U.S.C. section 360bbb-3(b)(1), unless the authorization is terminated or revoked sooner.    Influenza A by PCR NEGATIVE NEGATIVE Final   Influenza B by PCR NEGATIVE NEGATIVE Final    Comment: (NOTE) The Xpert Xpress SARS-CoV-2/FLU/RSV assay is intended as an aid in  the diagnosis of influenza from Nasopharyngeal swab specimens and  should not be used as a sole basis for treatment. Nasal washings and  aspirates are unacceptable for Xpert Xpress SARS-CoV-2/FLU/RSV  testing. Fact Sheet for  Patients: https://www.moore.com/https://www.fda.gov/media/142436/download Fact Sheet for Healthcare Providers: https://www.young.biz/https://www.fda.gov/media/142435/download This test is not yet approved or cleared by the [place] FDA and  has been authorized for detection and/or diagnosis of SARS-CoV-2 by  FDA under an Emergency Use Authorization (EUA). This EUA will remain  in effect (meaning this test can be used) for the duration of the  Covid-19 declaration under Section 564(b)(1) of the Act, 21  U.S.C. section 360bbb-3(b)(1), unless the authorization is  terminated or revoked. Performed at [facility], [address] [place], [place] [address]   Blood Culture (routine x 2)     Status: None (Preliminary result)   Collection Time: 10-26-2019  5:13 PM   Specimen: Left Antecubital; Blood  Result Value Ref Range Status   Specimen Description LEFT ANTECUBITAL  Final   Special Requests   Final    BOTTLES DRAWN AEROBIC ONLY Blood Culture results may not be optimal due to an inadequate volume of blood received in culture bottles   Culture   Final    NO GROWTH 2 DAYS Performed at [facility], [address] [place], [place] [address]    Report Status PENDING  Incomplete  Aerobic/Anaerobic Culture (surgical/deep wound)     Status: None (Preliminary result)   Collection Time: 10/17/19  6:14 PM   Specimen: Abscess  Result Value Ref Range Status   Specimen Description   Final    ABSCESS Performed at [facility], [address] [place], [place] [address]    Special Requests   Final    RIGHT Performed at [facility], [address] [place], [place] [address]    Gram Stain   Final    NO WBC SEEN NO ORGANISMS SEEN Performed at [facility], [address] [place], [place] [address]    Culture PENDING  Incomplete   Report Status PENDING  Incomplete  Aerobic/Anaerobic Culture (surgical/deep wound)     Status: None (Preliminary result)   Collection Time: 10/17/19  6:14 PM   Specimen: Abscess  Result Value Ref  Range Status   Specimen Description   Final    ABSCESS Performed at [facility], [address] [place], [place] [address]    Special Requests   Final    LEFT Performed at [facility], [address] [place], [place] [address]    Gram Stain   Final    FEW WBC PRESENT,BOTH PMN AND MONONUCLEAR RARE GRAM VARIABLE  ROD RARE GRAM POSITIVE COCCI RARE YEAST Performed at [facility], [place] [address], [place], [place] [address]    Culture PENDING  Incomplete   Report Status PENDING  Incomplete     Scheduled Meds: . aspirin EC  81 mg Oral Daily  . atorvastatin  80 mg Oral Daily  . clopidogrel  75 mg Oral Daily  . collagenase   Topical BID  . heparin injection (subcutaneous)  5,000 Units Subcutaneous Q8H  . levothyroxine  25 mcg Oral QAC breakfast  . memantine  10 mg Oral BID   Continuous Infusions: . ceFEPime (MAXIPIME) IV 2 g (10/17/19 2343)  . dextrose 75 mL/hr at 10/18/19 0922  . metronidazole 500 mg (10/18/19 0924)  . vancomycin 1,250 mg (10/18/19 0128)    Procedures/Studies: CT ABDOMEN PELVIS WO CONTRAST  Result Date: 10/30/2019 CLINICAL DATA:  Decubitus ulcers EXAM: CT ABDOMEN AND PELVIS WITHOUT CONTRAST TECHNIQUE: Multidetector CT imaging of the abdomen and pelvis was performed following the standard protocol without IV contrast. COMPARISON:  None. FINDINGS: Lower chest: No acute abnormality. Hepatobiliary: Scattered calcifications are noted within the liver. Gallbladder is well distended with multiple dependent gallstones. Pancreas: Unremarkable. No pancreatic ductal dilatation or surrounding inflammatory changes. Spleen: Normal in size without focal abnormality. Adrenals/Urinary Tract: Adrenal glands are within normal limits. Kidneys are well visualized bilaterally with renal vascular calcifications. No renal stones are seen. No obstructive changes are noted. The bladder is partially distended. Stomach/Bowel: The appendix is not well visualized although no inflammatory  changes to suggest appendicitis are noted. Retained fecal material is noted throughout the colon consistent with a mild degree of constipation without obstructive change. No small bowel abnormality is seen. Stomach is unremarkable. Vascular/Lymphatic: Aortic atherosclerosis. No enlarged abdominal or pelvic lymph nodes. Reproductive: Uterus and bilateral adnexa are unremarkable. Other: No abdominal wall hernia or abnormality. No abdominopelvic ascites. Musculoskeletal: Mild changes of anasarca are noted. Decubitus ulcer is noted laterally near the right hip with inflammatory changes extending from the skin towards the greater trochanter. This area measures approximately 7.6 x 4.3 cm. No definitive bony erosive changes to suggest osteomyelitis are noted at this time. No other definitive decubitus ulcer is seen. Degenerative changes of lumbar spine are seen. IMPRESSION: Right lateral decubitus ulcer with subcutaneous inflammatory changes extending towards the greater trochanter of the right proximal femur. No definitive bony destructive changes are seen. Considerable subcutaneous inflammatory changes noted with air tracking towards the lateral aspect of the femur. Changes of mild constipation. Cholelithiasis without complicating factors. Electronically Signed   By: Inez Catalina M.D.   On: 10/22/2019 16:51   US RENAL  Result Date: 10/18/2019 CLINICAL DATA:  Acute renal insufficiency. EXAM: RENAL / URINARY TRACT ULTRASOUND COMPLETE COMPARISON:  11/01/2019 CT FINDINGS: Right Kidney: Renal measurements: 9.3 x 4.4 x 5.8 cm = volume: 123 mL. Increased renal echogenicity. No hydronephrosis. Left Kidney: Renal measurements: 9.6 x 5.2 x 4.6 cm = volume: 119 mL. Increased renal echogenicity. No hydronephrosis. Bladder: Decompressed. Other: Trace perinephric edema/fluid likely relates to renal insufficiency. Note is made of gallbladder sludge and stones including at 1.0 cm. IMPRESSION: 1. No hydronephrosis. Increased renal  echogenicity, consistent with medical renal disease. 2. Cholelithiasis without acute cholecystitis. Electronically Signed   By: Abigail Miyamoto M.D.   On: 10/18/2019 11:51   DG Chest Port 1 View  Result Date: 11/07/2019 CLINICAL DATA:  Sepsis EXAM: PORTABLE CHEST 1 VIEW COMPARISON:  11/05/2014 FINDINGS: Stable positioning of a left-sided implanted cardiac device. Post CABG changes. Stable  cardiomediastinal contours. No focal airspace consolidation, pleural effusion, or pneumothorax. IMPRESSION: No acute cardiopulmonary findings. Electronically Signed   By: Duanne Guess D.O.   On: 2019-10-26 14:47    Catarina Hartshorn, DO  [facility]  If 7PM-7AM, please contact night-coverage [email or web address] Password [email or web address] 10/18/2019, 2:18 PM   LOS: 2 days

## 2019-10-18 NOTE — Progress Notes (Addendum)
[facility] Progress Note     Subjective: Patient is alert, follows commands, and is able to answer questions appropriately. She reports that she feels well and is in no pain at the moment.  Objective: Vital signs in last 24 hours: Temp:  [97 F (36.1 C)] 97 F (36.1 C) (03/11 0400) Pulse Rate:  [72-75] 75 (03/11 0400) Resp:  [16-20] 16 (03/11 0400) BP: (91-93)/(38-59) 91/59 (03/11 0400) SpO2:  [94 %-100 %] 94 % (03/11 0400)    Intake/Output from previous day: 03/10 0701 - 03/11 0700 In: 1804.4 [P.O.:240; I.V.:722.9; IV Piggyback:841.5] Out: 60 [Urine:60] Intake/Output this shift: No intake/output data recorded.  General appearance: alert, cooperative and no distress Resp: clear to auscultation bilaterally Cardio: regular rate and rhythm, S1, S2 normal, no murmur, click, rub or gallop  Lab Results:  Recent Labs    10/17/19 0607 10/18/19 0611  WBC 25.6* 24.5*  HGB 9.4* 8.2*  HCT 32.6* 28.6*  PLT 260 226   BMET Recent Labs    10/17/19 0607 10/18/19 0611  NA 148* 142  K 4.3 4.1  CL 116* 112*  CO2 20* 20*  GLUCOSE 166* 188*  BUN 54* 56*  CREATININE 1.67* 1.81*  CALCIUM 7.6* 7.1*   PT/INR Recent Labs    11/15/2019 1515  LABPROT 14.0  INR 1.1    Studies/Results: CT ABDOMEN PELVIS WO CONTRAST  Result Date: 11-15-2019 CLINICAL DATA:  Decubitus ulcers EXAM: CT ABDOMEN AND PELVIS WITHOUT CONTRAST TECHNIQUE: Multidetector CT imaging of the abdomen and pelvis was performed following the standard protocol without IV contrast. COMPARISON:  None. FINDINGS: Lower chest: No acute abnormality. Hepatobiliary: Scattered calcifications are noted within the liver. Gallbladder is well distended with multiple dependent gallstones. Pancreas: Unremarkable. No pancreatic ductal dilatation or surrounding inflammatory changes. Spleen: Normal in size without focal abnormality. Adrenals/Urinary Tract: Adrenal glands are within normal limits. Kidneys are well visualized  bilaterally with renal vascular calcifications. No renal stones are seen. No obstructive changes are noted. The bladder is partially distended. Stomach/Bowel: The appendix is not well visualized although no inflammatory changes to suggest appendicitis are noted. Retained fecal material is noted throughout the colon consistent with a mild degree of constipation without obstructive change. No small bowel abnormality is seen. Stomach is unremarkable. Vascular/Lymphatic: Aortic atherosclerosis. No enlarged abdominal or pelvic lymph nodes. Reproductive: Uterus and bilateral adnexa are unremarkable. Other: No abdominal wall hernia or abnormality. No abdominopelvic ascites. Musculoskeletal: Mild changes of anasarca are noted. Decubitus ulcer is noted laterally near the right hip with inflammatory changes extending from the skin towards the greater trochanter. This area measures approximately 7.6 x 4.3 cm. No definitive bony erosive changes to suggest osteomyelitis are noted at this time. No other definitive decubitus ulcer is seen. Degenerative changes of lumbar spine are seen. IMPRESSION: Right lateral decubitus ulcer with subcutaneous inflammatory changes extending towards the greater trochanter of the right proximal femur. No definitive bony destructive changes are seen. Considerable subcutaneous inflammatory changes noted with air tracking towards the lateral aspect of the femur. Changes of mild constipation. Cholelithiasis without complicating factors. Electronically Signed   By: Alcide Clever M.D.   On: 15-Nov-2019 16:51   DG Chest Port 1 View  Result Date: 15-Nov-2019 CLINICAL DATA:  Sepsis EXAM: PORTABLE CHEST 1 VIEW COMPARISON:  11/05/2014 FINDINGS: Stable positioning of a left-sided implanted cardiac device. Post CABG changes. Stable cardiomediastinal contours. No focal airspace consolidation, pleural effusion, or pneumothorax. IMPRESSION: No acute cardiopulmonary findings. Electronically Signed   By: Duanne Guess  D.O.   On: 11/01/2019 14:47    Anti-infectives: Anti-infectives (From admission, onward)   Start     Dose/Rate Route Frequency Ordered Stop   10/17/19 2359  vancomycin (VANCOREADY) IVPB 1250 mg/250 mL     1,250 mg 166.7 mL/hr over 90 Minutes Intravenous Every 48 hours 10/19/2019 2022     10/10/2019 2359  metroNIDAZOLE (FLAGYL) IVPB 500 mg     500 mg 100 mL/hr over 60 Minutes Intravenous Every 8 hours 11/06/2019 2047     11/05/2019 2359  ceFEPIme (MAXIPIME) 2 g in sodium chloride 0.9 % 100 mL IVPB     2 g 200 mL/hr over 30 Minutes Intravenous Every 24 hours 11/05/2019 2012     10/11/2019 1430  vancomycin (VANCOCIN) IVPB 1000 mg/200 mL premix     1,000 mg 200 mL/hr over 60 Minutes Intravenous  Once 10/09/2019 1428 11/01/2019 1713   10/10/2019 1430  piperacillin-tazobactam (ZOSYN) IVPB 3.375 g     3.375 g 100 mL/hr over 30 Minutes Intravenous  Once 10/12/2019 1428 10/20/2019 1607      Assessment/Plan: s/p surgical debridement of right and left lateral hip decubitus ulcers, day 1.  Impression: Patient is not in current distress. Wound cultures from surgical debridement yesterday are being processed. Dressing applied yesterday following surgical debridement is intact.  Plan: We will continue to follow the patient for progression of decubitus ulcers. Awaiting wound culture results for potential targeted antimicrobial therapy. Allow nursing staff to routinely change wound dressings as appropriate. Will re-examine wounds tomorrow.  LOS: 2 days    Fredderick Severance 10/18/2019

## 2019-10-18 NOTE — Consult Note (Signed)
Consultation Note Date: 10/18/2019   Patient Name: [name]  DOB: 10-Dec-1934  MRN: [id number]  Age / Sex: 85 y.o., female  PCP: [name], MD Referring Physician: Orson Eva, MD  Reason for Consultation: Establishing goals of care and Psychosocial/spiritual support  HPI/Patient Profile: 85 y.o. female  with past medical history of dementia, chronic systolic heart failure, CAD with three-vessel CABG in 2009, HTN/HLD, polymorphic ventricular tachycardia sp ICD, DM 2, lives at home with spouse admitted on 11/07/2019 with sepsis present on admission secondary to infected decubitus ulcers.   Clinical Assessment and Goals of Care: [name] is lying quietly in bed.  She has known dementia, but is calm and not fearful.  She denies pain at this time.  Her husband is at bedside.    Mr. Parillo and I talk about [name]'s acute health problems, her bedsores, surgical debridement.  Mr. Lahti tells me that he asked his wife if she wanted the treatment and she did, that is why he chose to go forward with debridement.  We talked about the importance of keeping [name]'s in the center of decision-making.  We talked about discharge options, and at this point Mr. Klinger is unsure if he would except rehab if offered.  He talks about a previous experience with at home health services.  We talked about home health services as finite, they will not continue forever.  Mr. Leske talks about [name]'s ICD, and it needing to be replaced this year.  We talk about looking at the entire being, not just physical, but spiritual and emotional.    We talked about at home hospice services also.at home hospice services.  Mr. Munoz tells me that he had an aunt who had in home hospice. I encourage Mr. Hunke to consider a consult with hospice services.   Conference with attending and bedside nursing staff related to patient  condition, needs, and [place] discussions.  PMT to follow.   HCPOA   NEXT OF KIN -spouse, Samona Chihuahua    SUMMARY OF RECOMMENDATIONS   Treat the treatable, no CPR or intubation  Unsure if would go to rehab or return to home with [facility].   Code Status/Advance Care Planning:  DNR  Symptom Management:   Per hospitalist/surgery, no additional needs at this time.  Palliative Prophylaxis:   Frequent Pain Assessment, Palliative Wound Care and Turn Reposition  Additional Recommendations (Limitations, Scope, Preferences):  treat the treatable, no CPR or intubation    Psycho-social/Spiritual:   Desire for further Chaplaincy support:no  Additional Recommendations: Caregiving  Support/Resources and Education on Hospice  Prognosis:   Unable to determine, based on outcomes.  Guarded.   Discharge Planning: To Be Determined      Primary Diagnoses: Present on Admission: . Sepsis ([place]) . Cardiomyopathy, ischemic EF 25-30% . Hypertension . S/P ICD (internal cardiac defibrillator) procedure, with upgrade to ICD/CRT-D  Medtronic . Mobitz type 2 second degree atrioventricular block   I have reviewed the medical record, interviewed the patient and family, and examined  the patient. The following aspects are pertinent.  Past Medical History:  Diagnosis Date  . Alzheimer's dementia (HCC)   . Chronic systolic CHF (congestive heart failure) (HCC)    a. 04/2012 EF 25-30%, viable myocardium by rest thallium redistribution   . Coronary artery disease    a. 2009 CABG x 3(LIMA->LAD, VG->RAMUS, VG->OM;  b. 04/2012 Cath/PCI: LM nl, LAD 50-70p,38m, RI 121m, LCX occluded OM's, RCA 20m, 30-40d, PDA 70p, VG->RI nl, VG->OM nl, LIMA->LAD nl, EF 25-30%. RCA stented w/ 2.25x28 & 2.5x60mm Xpedition DES'.   . Hyperlipidemia   . Hypertension   . Ischemic cardiomyopathy   . Polymorphic ventricular tachycardia (HCC)   . S/P ICD (internal cardiac defibrillator) procedure, with upgrade to ICD/CRT-D  Medtronic  11/06/2014  . Type II diabetes mellitus (HCC)    Social History   Socioeconomic History  . Marital status: Married    Spouse name: Not on file  . Number of children: Not on file  . Years of education: Not on file  . Highest education level: Not on file  Occupational History  . Not on file  Tobacco Use  . Smoking status: Never Smoker  . Smokeless tobacco: Never Used  Substance and Sexual Activity  . Alcohol use: No    Alcohol/week: 0.0 standard drinks  . Drug use: No  . Sexual activity: Yes  Other Topics Concern  . Not on file  Social History Narrative  . Not on file   Social Determinants of Health   Financial Resource Strain:   . Difficulty of Paying Living Expenses:   Food Insecurity:   . Worried About Programme researcher, broadcasting/film/video in the Last Year:   . Barista in the Last Year:   Transportation Needs:   . Freight forwarder (Medical):   Marland Kitchen Lack of Transportation (Non-Medical):   Physical Activity:   . Days of Exercise per Week:   . Minutes of Exercise per Session:   Stress:   . Feeling of Stress :   Social Connections:   . Frequency of Communication with Friends and Family:   . Frequency of Social Gatherings with Friends and Family:   . Attends Religious Services:   . Active Member of Clubs or Organizations:   . Attends Banker Meetings:   Marland Kitchen Marital Status:    History reviewed. No pertinent family history. Scheduled Meds: . aspirin EC  81 mg Oral Daily  . atorvastatin  80 mg Oral Daily  . clopidogrel  75 mg Oral Daily  . collagenase   Topical BID  . heparin injection (subcutaneous)  5,000 Units Subcutaneous Q8H  . levothyroxine  25 mcg Oral QAC breakfast  . memantine  10 mg Oral BID   Continuous Infusions: . ceFEPime (MAXIPIME) IV 2 g (10/17/19 2343)  . dextrose 75 mL/hr at 10/18/19 0922  . metronidazole 500 mg (10/18/19 0924)  . vancomycin 1,250 mg (10/18/19 0128)   PRN Meds:.acetaminophen **OR** acetaminophen, polyethylene  glycol Medications Prior to Admission:  Prior to Admission medications   Medication Sig Start Date End Date Taking? Authorizing Provider  acetaminophen (TYLENOL) 325 MG tablet Take 1-2 tablets (325-650 mg total) by mouth every 4 (four) hours as needed for mild pain. 11/06/14  Yes Leone Brand, NP  aspirin EC 81 MG tablet Take 81 mg by mouth daily.   Yes [provider]  atorvastatin (LIPITOR) 80 MG tablet TAKE 1 TABLET BY MOUTH EVERY DAY 06/19/18  Yes Branch, Dorothe Pea, MD  carvedilol (  COREG) 25 MG tablet TAKE 1 TABLET BY MOUTH TWICE A DAY 06/22/19  Yes Branch, Dorothe Pea, MD  clopidogrel (PLAVIX) 75 MG tablet TAKE 1 TABLET BY MOUTH EVERY DAY 04/12/18  Yes Branch, Dorothe Pea, MD  ferrous gluconate (FERGON) 324 MG tablet Take 324 mg by mouth daily. 09/12/19  Yes [provider]  hydrALAZINE (APRESOLINE) 50 MG tablet TAKE 1 TABLET (50 MG TOTAL) BY MOUTH EVERY 8 (EIGHT) HOURS. 06/19/18  Yes BranchDorothe Pea, MD  ibuprofen (ADVIL) 200 MG tablet Take 200 mg by mouth every 6 (six) hours as needed for mild pain.   Yes [provider]  KLOR-CON M20 20 MEQ tablet TAKE 1 TABLET BY MOUTH EVERY DAY 08/21/18  Yes Branch, Dorothe Pea, MD  levothyroxine (SYNTHROID, LEVOTHROID) 25 MCG tablet TAKE 1 TABLET BY MOUTH DAILY BEFORE BREAKFAST. Patient taking differently: Take 25 mcg by mouth daily before breakfast.  11/09/17  Yes Leone Brand, NP  magnesium oxide (MAG-OX) 400 MG tablet Take 1 tablet (400 mg total) by mouth daily. 05/26/18  Yes BranchDorothe Pea, MD  meclizine (ANTIVERT) 25 MG tablet Take 25 mg by mouth 3 (three) times daily as needed for dizziness.   Yes [provider]  memantine (NAMENDA) 10 MG tablet Take 10 mg by mouth 2 (two) times daily. 10/23/14  Yes [provider]  nitroGLYCERIN (NITROLINGUAL) 0.4 MG/SPRAY spray Place 1 spray under the tongue every 5 (five) minutes x 3 doses as needed for chest pain (up to 3 doses, if no relief after 3rd dose,  proceed to the ED for an evaluation). 05/26/18  Yes BranchDorothe Pea, MD  oxyCODONE-acetaminophen (PERCOCET/ROXICET) 5-325 MG tablet Take 1 tablet by mouth as needed. 09/18/19  Yes [provider]  sacubitril-valsartan (ENTRESTO) 97-103 MG Take 1 tablet by mouth 2 (two) times daily. 10/26/18  Yes BranchDorothe Pea, MD  spironolactone (ALDACTONE) 25 MG tablet Take 0.5 tablets (12.5 mg total) by mouth daily. 05/26/18 10/19/19 Yes Branch, Dorothe Pea, MD  furosemide (LASIX) 40 MG tablet Take 1.5 tablets (60 mg total) by mouth daily. 01/17/18 06/13/19  Antoine Poche, MD   No Known Allergies Review of Systems  Unable to perform ROS: Dementia    Physical Exam Vitals and nursing note reviewed.  Constitutional:      General: She is not in acute distress.    Appearance: She is ill-appearing.  Cardiovascular:     Rate and Rhythm: Normal rate.  Pulmonary:     Effort: No respiratory distress.  Skin:    General: Skin is warm and dry.     Comments: Wounds as noted   Neurological:     Comments: Known dementia   Psychiatric:     Comments: Calm, not fearful      Vital Signs: BP (!) 91/59 (BP Location: Right Arm)   Pulse 75   Temp (!) 97 F (36.1 C) (Axillary)   Resp 16   Ht 5\' 4"  (1.626 m)   Wt 71.3 kg   SpO2 94%   BMI 26.98 kg/m  Pain Scale: PAINAD   Pain Score: Asleep   SpO2: SpO2: 94 % O2 Device:SpO2: 94 % O2 Flow Rate: .   IO: Intake/output summary:   Intake/Output Summary (Last 24 hours) at 10/18/2019 1316 Last data filed at 10/18/2019 0500 Gross per 24 hour  Intake 1804.36 ml  Output 60 ml  Net 1744.36 ml    LBM:   Baseline Weight: Weight: 72 kg Most recent weight: Weight: 71.3 kg  Palliative Assessment/Data:   Flowsheet Rows     Most Recent Value  Intake Tab  Referral Department  Hospitalist  Unit at Time of Referral  Cardiac/Telemetry Unit  Palliative Care Primary Diagnosis  Other (Comment)  Date Notified  10/17/19  Palliative Care Type  New  Palliative care  Reason for referral  Clarify Goals of Care  Date of Admission  10/28/2019  Date first seen by Palliative Care  10/18/19  # of days Palliative referral response time  1 Day(s)  # of days IP prior to Palliative referral  1  Clinical Assessment  Palliative Performance Scale Score  30%  Pain Max last 24 hours  Not able to report  Pain Min Last 24 hours  Not able to report  Dyspnea Max Last 24 Hours  Not able to report  Dyspnea Min Last 24 hours  Not able to report  Psychosocial & Spiritual Assessment  Palliative Care Outcomes      Time In: 1410 Time Out: 1500 Time Total: 50 minutes  Greater than 50%  of this time was spent counseling and coordinating care related to the above assessment and plan.  Signed by: Katheran Awe, NP   Please contact Palliative Medicine Team phone at [phone] for questions and concerns.  For individual provider: See Loretha Stapler

## 2019-10-19 DIAGNOSIS — I441 Atrioventricular block, second degree: Secondary | ICD-10-CM

## 2019-10-19 DIAGNOSIS — L89223 Pressure ulcer of left hip, stage 3: Secondary | ICD-10-CM

## 2019-10-19 LAB — CBC
HCT: 29 % — ABNORMAL LOW (ref 36.0–46.0)
Hemoglobin: 8.2 g/dL — ABNORMAL LOW (ref 12.0–15.0)
MCH: 29.3 pg (ref 26.0–34.0)
MCHC: 28.3 g/dL — ABNORMAL LOW (ref 30.0–36.0)
MCV: 103.6 fL — ABNORMAL HIGH (ref 80.0–100.0)
Platelets: 197 10*3/uL (ref 150–400)
RBC: 2.8 MIL/uL — ABNORMAL LOW (ref 3.87–5.11)
RDW: 14.8 % (ref 11.5–15.5)
WBC: 19.5 10*3/uL — ABNORMAL HIGH (ref 4.0–10.5)
nRBC: 0 % (ref 0.0–0.2)

## 2019-10-19 LAB — CULTURE, BLOOD (ROUTINE X 2): Special Requests: ADEQUATE

## 2019-10-19 LAB — GLUCOSE, CAPILLARY
Glucose-Capillary: 194 mg/dL — ABNORMAL HIGH (ref 70–99)
Glucose-Capillary: 169 mg/dL — ABNORMAL HIGH (ref 70–99)
Glucose-Capillary: 185 mg/dL — ABNORMAL HIGH (ref 70–99)
Glucose-Capillary: 213 mg/dL — ABNORMAL HIGH (ref 70–99)
Glucose-Capillary: 165 mg/dL — ABNORMAL HIGH (ref 70–99)

## 2019-10-19 LAB — BASIC METABOLIC PANEL
Anion gap: 12 (ref 5–15)
BUN: 60 mg/dL — ABNORMAL HIGH (ref 8–23)
CO2: 15 mmol/L — ABNORMAL LOW (ref 22–32)
Calcium: 6.7 mg/dL — ABNORMAL LOW (ref 8.9–10.3)
Chloride: 111 mmol/L (ref 98–111)
Creatinine, Ser: 1.74 mg/dL — ABNORMAL HIGH (ref 0.44–1.00)
GFR calc Af Amer: 31 mL/min — ABNORMAL LOW (ref >60)
GFR calc non Af Amer: 27 mL/min — ABNORMAL LOW (ref >60)
Glucose, Bld: 196 mg/dL — ABNORMAL HIGH (ref 70–99)
Potassium: 4.3 mmol/L (ref 3.5–5.1)
Sodium: 138 mmol/L (ref 135–145)

## 2019-10-19 LAB — CK: Total CK: 122 U/L (ref 38–234)

## 2019-10-19 LAB — HEMOGLOBIN A1C
Hgb A1c MFr Bld: 5.5 % (ref 4.8–5.6)
Mean Plasma Glucose: 111.15 mg/dL

## 2019-10-19 MED ORDER — HYDROCODONE-ACETAMINOPHEN 5-325 MG PO TABS
1.0000 | ORAL_TABLET | Freq: Four times a day (QID) | ORAL | Status: DC | PRN
  Administered 2019-10-19 – 2019-10-20 (×2): 1 via ORAL
  Filled 2019-10-19 (×4): qty 1

## 2019-10-19 MED ORDER — SODIUM CHLORIDE 0.9 % IV BOLUS
500.0000 mL | Freq: Once | INTRAVENOUS | Status: AC
  Administered 2019-10-19: 23:00:00 500 mL via INTRAVENOUS

## 2019-10-19 NOTE — Progress Notes (Signed)
NT reported to me BP of 73/41, MAP of 52. Other VSS. Pt decreased LOC noted. Responds to voice. Bladder scan x2 performed. At this time 63mL detected. MD notified of all changes. Orders obtained to administer bolus. Began administration now. MD notes to re-evaluate BP once bolus completed, possible transfer to ICU pending BP changes. Will continue to monitor patient.

## 2019-10-19 NOTE — Plan of Care (Signed)

## 2019-10-19 NOTE — Progress Notes (Signed)
PROGRESS NOTE  Edia Pursifull WPY:[id number] DOB: [birth year] DOA: 10/23/19 PCP: [name], MD   Brief History: 85 year old female with a history of Alzheimer's dementia, coronary artery disease, systolic CHF with EF 20 to 25%, torsades VT status post AICD, hypertension, diabetes mellitus type 2, hyperlipidemia presenting with increasing somnolence and poor oral intake. The patient is unable to provide any significant history secondary to her dementia. History is supplemented by the patient's spouse at the bedside. He states that over the past month he has noticed worsening decubitus ulcers for which he has been caring at home. He is the primary caregiver. She has not had fevers, chills, chest pain, shortness of breath, nausea, vomiting, diarrhea. However over the past month he has noticed increasing somnolence and decreased oral intake as well as increasing pain. In addition, the spouse has noted a worsening functional and cognitive decline over the last 4 months. She has been essentially bedbound for at least the last 4 to 5 months. The patient was afebrile hemodynamically stable with oxygen saturation 100%. Lactic acid was 3.1 with WBC 21.7. The patient was started on IV antibiotics.  Assessment/Plan: Sepsis -Present on admission -Secondary to infected decubitus ulcers, bacteremia, possible UTI -Lactic acid 3.1 -Continue vancomycin, cefepime, metronidazole  Bacteremia--B.fragilis -2019-10-23 blood culture--GNR, GPC -there is likely uncultureable/nonviable GNR  Infected decubitus ulcers -2019-10-23 CT abdomen/pelvis--right lateral decubitus ulcer with subcutaneous inflammatory changes extending toward the greater trochanter. There is no bony destructive changes. There is considerable inflammatory change with air tracking toward the lateral aspect of the femur. -General surgery consult appreciated -10/17/19--bedside debridement -Continue empiric antibiotics--following  cultures  UTI--Klebsiella PNA -UA >50wbc -already on abx for sepsis/decubiti  Failure to thrive -Serum B12--199-->supplement -TSH--37.364, Free T4--0.28 -Folate--7.2 -PT evaluation-->SNF  Hypernatremia -Discontinue half-normal saline -continue D5W-->improved -due to continued poor po intake-->restart NS  CKD stage 3b -initially felt to be AKI but now more likely progression of CKD -pt likely has progression of underlying CKD -renal US-->medical renal disease  Chronic systolic CHF -02/2015 echo EF 20-25%, G2DD -Judicious IV fluids  Alzheimer's dementia -Continue Namenda  Coronary artery disease -hx of CABG in 2009 (LIMA-LAD, SVG-ramus, SVG-OM). Hx of RCA stent 04/2012 -no chest pain presently -Continue aspirin and Plavix  Ventricular Tachycardia - she has CRT-D device followed by EP - she underwent ICD upgrade of single chamber ICD to [employer] CRT-D  Hyperlipidemia -continue statin  HTN -holding coreg/hydralazine due to soft BPs  Bilateral hip and sacral decubiti--unstageable -present on admission -appreciate surgical eval      Disposition Plan: Patient From: Home D/C Place: Home--refuses SNF Barriers: Not Clinically Stable--poor po intake, low BPs, awaiting final cultures and wound improvement  Family Communication:Spouse updatedat bedside 3/13  Consultants:General surgery; palliative medicine  Code Status: DNR  DVT Prophylaxis: [place] Heparin    Procedures: As Listed in Progress Note Above  Antibiotics: vanc 3/9>> Cefepime 3/9>> Metronidazole 3/9>>   Total time spent 35 minutes.  Greater than 50% spent face to face counseling and coordinating care.    Subjective: Pt is more alert.  Now pleasantly confused.  Complains of buttock and knee pain.  Denies f/c, headache, cp, sob, n/v/d  Objective: Vitals:   10/18/19 0400 10/18/19 2017 10/18/19 2139 10/19/19 0512  BP: (!) 91/59  (!) 94/42 96/61  Pulse: 75  80 80   Resp: 16  20 20   Temp: (!) 97 F (36.1 C)  97.9 F (36.6 C) 98 F (36.7 C)  TempSrc:  Axillary  Oral Oral  SpO2: 94% 94% 100% 100%  Weight:      Height:        Intake/Output Summary (Last 24 hours) at 10/19/2019 1222 Last data filed at 10/19/2019 0900 Gross per 24 hour  Intake 1364.11 ml  Output 300 ml  Net 1064.11 ml   Weight change:  Exam:   General:  Pt is alert, follows commands appropriately, not in acute distress  HEENT: No icterus, No thrush, No neck mass, [place]/AT  Cardiovascular: RRR, S1/S2, no rubs, no gallops  Respiratory: fine bibasilar rales. No wheeze  Abdomen: Soft/+BS, non tender, non distended, no guarding  Extremities: 1+ edema, No lymphangitis, No petechiae, No rashes, no synovitis--right hip wounds without crepitance or necrosis   Data Reviewed: I have personally reviewed following labs and imaging studies Basic Metabolic Panel: Recent Labs  Lab 10/12/2019 1515 10/19/2019 1740 10/17/19 0607 10/18/19 0611 10/19/19 0500  NA 147*  --  148* 142 138  K 4.4  --  4.3 4.1 4.3  CL 112*  --  116* 112* 111  CO2 24  --  20* 20* 15*  GLUCOSE 195*  --  166* 188* 196*  BUN 54*  --  54* 56* 60*  CREATININE 1.83*  --  1.67* 1.81* 1.74*  CALCIUM 7.9*  --  7.6* 7.1* 6.7*  MG  --  2.4  --  2.3  --    Liver Function Tests: Recent Labs  Lab 10/14/2019 1515  AST 20  ALT 13  ALKPHOS 81  BILITOT 0.8  PROT 6.6  ALBUMIN 2.7*   No results for input(s): LIPASE, AMYLASE in the last 168 hours. No results for input(s): AMMONIA in the last 168 hours. Coagulation Profile: Recent Labs  Lab 11/01/2019 1515  INR 1.1   CBC: Recent Labs  Lab 10/09/2019 1515 10/17/19 0607 10/17/19 1455 10/18/19 0611 10/19/19 0500  WBC 21.7* 25.6*  --  24.5* 19.5*  NEUTROABS 19.7*  --   --   --   --   HGB 8.6* 9.4*  --  8.2* 8.2*  HCT 28.9* 32.6* 27.9* 28.6* 29.0*  MCV 100.3* 104.8*  --  102.5* 103.6*  PLT 299 260  --  226 197   Cardiac Enzymes: Recent Labs  Lab 10/19/19 0500    CKTOTAL 122   BNP: Invalid input(s): POCBNP CBG: Recent Labs  Lab 10/18/19 1717 10/18/19 2135 10/19/19 0522 10/19/19 0757 10/19/19 1159  GLUCAP 154* 171* 194* 169* 185*   HbA1C: No results for input(s): HGBA1C in the last 72 hours. Urine analysis:    Component Value Date/Time   COLORURINE AMBER (A) 10/17/2019 1426   APPEARANCEUR CLOUDY (A) 10/17/2019 1426   LABSPEC 1.021 10/17/2019 1426   PHURINE 5.0 10/17/2019 1426   GLUCOSEU NEGATIVE 10/17/2019 1426   HGBUR SMALL (A) 10/17/2019 1426   BILIRUBINUR NEGATIVE 10/17/2019 1426   KETONESUR 5 (A) 10/17/2019 1426   PROTEINUR 30 (A) 10/17/2019 1426   NITRITE NEGATIVE 10/17/2019 1426   LEUKOCYTESUR MODERATE (A) 10/17/2019 1426   Sepsis Labs: (procalcitonin:4,lacticidven:4) ) Recent Results (from the past 240 hour(s))  Wound or Superficial Culture     Status: None (Preliminary result)   Collection Time: 11/01/2019  2:28 PM   Specimen: Wound  Result Value Ref Range Status   Specimen Description   Final    WOUND RIGHT ILIAC Performed at [facility], [address] [place], [place] [address]    Special Requests   Final    Normal Performed at [facility], [address] [place], [place] [address]    Gram Stain   Final    RARE WBC PRESENT, PREDOMINANTLY PMN ABUNDANT GRAM NEGATIVE RODS FEW GRAM POSITIVE COCCI RARE GRAM POSITIVE RODS    Culture   Final    ABUNDANT ESCHERICHIA COLI ABUNDANT PROTEUS MIRABILIS CULTURE REINCUBATED FOR BETTER GROWTH Performed at [facility], [address], [place], [place] [address]    Report Status PENDING  Incomplete   Organism ID, Bacteria ESCHERICHIA COLI  Final   Organism ID, Bacteria PROTEUS MIRABILIS  Final      Susceptibility   Escherichia coli - MIC*    AMPICILLIN >=32 RESISTANT Resistant     CEFAZOLIN >=64 RESISTANT Resistant     CEFEPIME <=0.12 SENSITIVE Sensitive     CEFTAZIDIME <=1 SENSITIVE Sensitive     CEFTRIAXONE <=0.25 SENSITIVE Sensitive      CIPROFLOXACIN <=0.25 SENSITIVE Sensitive     GENTAMICIN <=1 SENSITIVE Sensitive     IMIPENEM <=0.25 SENSITIVE Sensitive     TRIMETH/SULFA <=20 SENSITIVE Sensitive     AMPICILLIN/SULBACTAM >=32 RESISTANT Resistant     PIP/TAZO <=4 SENSITIVE Sensitive     * ABUNDANT ESCHERICHIA COLI   Proteus mirabilis - MIC*    AMPICILLIN <=2 SENSITIVE Sensitive     CEFAZOLIN <=4 SENSITIVE Sensitive     CEFEPIME <=0.12 SENSITIVE Sensitive     CEFTAZIDIME <=1 SENSITIVE Sensitive     CEFTRIAXONE <=0.25 SENSITIVE Sensitive     CIPROFLOXACIN <=0.25 SENSITIVE Sensitive     GENTAMICIN <=1 SENSITIVE Sensitive     IMIPENEM 4 SENSITIVE Sensitive     TRIMETH/SULFA <=20 SENSITIVE Sensitive     AMPICILLIN/SULBACTAM <=2 SENSITIVE Sensitive     PIP/TAZO <=4 SENSITIVE Sensitive     * ABUNDANT PROTEUS MIRABILIS  Blood Culture (routine x 2)     Status: Abnormal   Collection Time: November 01, 2019  3:15 PM   Specimen: BLOOD  Result Value Ref Range Status   Specimen Description   Final    BLOOD RIGHT ANTECUBITAL Performed at [facility], [address] [place], [place] [address]    Special Requests   Final    BOTTLES DRAWN AEROBIC AND ANAEROBIC Blood Culture adequate volume Performed at [facility], [address], [place], [place] [address]    Culture  Setup Time   Final    GRAM NEGATIVE RODS Gram Stain Report Called to,Read Back By and Verified With: OLIVER,J ON 10/17/19 AT 1910 BY LOY,C PERFORMED AT [facility] ANAEROBIC BOTTLE GRAM POSITIVE COCCI AEROBIC BOTTLE ONLY Gram Stain Report Called to,Read Back By and Verified With: H EVANS,[id number]  10/18/19 MKELLY    Culture (A)  Final    STAPHYLOCOCCUS EPIDERMIDIS THE SIGNIFICANCE OF ISOLATING THIS ORGANISM FROM A SINGLE SET OF BLOOD CULTURES WHEN MULTIPLE SETS ARE DRAWN IS UNCERTAIN. PLEASE NOTIFY THE MICROBIOLOGY DEPARTMENT WITHIN ONE WEEK IF SPECIATION AND SENSITIVITIES ARE REQUIRED. BACTEROIDES FRAGILIS BETA LACTAMASE POSITIVE Performed at [facility],  [address] [place], [place] [address]    Report Status 10/19/2019 FINAL  Final  Blood Culture ID Panel (Reflexed)     Status: None   Collection Time: 11-01-19  3:15 PM  Result Value Ref Range Status   Enterococcus species NOT DETECTED NOT DETECTED Final   Listeria monocytogenes NOT DETECTED NOT DETECTED Final   Staphylococcus species NOT DETECTED NOT DETECTED Final   Staphylococcus aureus (BCID) NOT DETECTED NOT DETECTED Final   Streptococcus species NOT DETECTED NOT DETECTED Final   Streptococcus agalactiae NOT  DETECTED NOT DETECTED Final   Streptococcus pneumoniae NOT DETECTED NOT DETECTED Final   Streptococcus pyogenes NOT DETECTED NOT DETECTED Final   Acinetobacter baumannii NOT DETECTED NOT DETECTED Final   Enterobacteriaceae species NOT DETECTED NOT DETECTED Final   Enterobacter cloacae complex NOT DETECTED NOT DETECTED Final   Escherichia coli NOT DETECTED NOT DETECTED Final   Klebsiella oxytoca NOT DETECTED NOT DETECTED Final   Klebsiella pneumoniae NOT DETECTED NOT DETECTED Final   Proteus species NOT DETECTED NOT DETECTED Final   Serratia marcescens NOT DETECTED NOT DETECTED Final   Haemophilus influenzae NOT DETECTED NOT DETECTED Final   Neisseria meningitidis NOT DETECTED NOT DETECTED Final   Pseudomonas aeruginosa NOT DETECTED NOT DETECTED Final   Candida albicans NOT DETECTED NOT DETECTED Final   Candida glabrata NOT DETECTED NOT DETECTED Final   Candida krusei NOT DETECTED NOT DETECTED Final   Candida parapsilosis NOT DETECTED NOT DETECTED Final   Candida tropicalis NOT DETECTED NOT DETECTED Final    Comment: Performed at [facility], [place] [address] [place], [place] [address]  Respiratory Panel by RT PCR (Flu A&B, Covid) - Nasopharyngeal Swab     Status: None   Collection Time: 11-10-19  5:00 PM   Specimen: Nasopharyngeal Swab  Result Value Ref Range Status   SARS Coronavirus 2 by RT PCR NEGATIVE NEGATIVE Final    Comment: (NOTE) SARS-CoV-2 target nucleic  acids are NOT DETECTED. The SARS-CoV-2 RNA is generally detectable in upper respiratoy specimens during the acute phase of infection. The lowest concentration of SARS-CoV-2 viral copies this assay can detect is 131 copies/mL. A negative result does not preclude SARS-Cov-2 infection and should not be used as the sole basis for treatment or other patient management decisions. A negative result may occur with  improper specimen collection/handling, submission of specimen other than nasopharyngeal swab, presence of viral mutation(s) within the areas targeted by this assay, and inadequate number of viral copies (<131 copies/mL). A negative result must be combined with clinical observations, patient history, and epidemiological information. The expected result is Negative. Fact Sheet for Patients:  PinkCheek.be Fact Sheet for Healthcare Providers:  GravelBags.it This test is not yet ap proved or cleared by the [place] FDA and  has been authorized for detection and/or diagnosis of SARS-CoV-2 by FDA under an Emergency Use Authorization (EUA). This EUA will remain  in effect (meaning this test can be used) for the duration of the COVID-19 declaration under Section 564(b)(1) of the Act, 21 U.S.C. section 360bbb-3(b)(1), unless the authorization is terminated or revoked sooner.    Influenza A by PCR NEGATIVE NEGATIVE Final   Influenza B by PCR NEGATIVE NEGATIVE Final    Comment: (NOTE) The Xpert Xpress SARS-CoV-2/FLU/RSV assay is intended as an aid in  the diagnosis of influenza from Nasopharyngeal swab specimens and  should not be used as a sole basis for treatment. Nasal washings and  aspirates are unacceptable for Xpert Xpress SARS-CoV-2/FLU/RSV  testing. Fact Sheet for Patients: PinkCheek.be Fact Sheet for Healthcare Providers: GravelBags.it This test is not yet  approved or cleared by the [place] FDA and  has been authorized for detection and/or diagnosis of SARS-CoV-2 by  FDA under an Emergency Use Authorization (EUA). This EUA will remain  in effect (meaning this test can be used) for the duration of the  Covid-19 declaration under Section 564(b)(1) of the Act, 21  U.S.C. section 360bbb-3(b)(1), unless the authorization is  terminated or revoked. Performed at [facility], [address],  [place], [place] [address]   Blood Culture (routine x 2)     Status: None (Preliminary result)   Collection Time: 07/07/20  5:13 PM   Specimen: Left Antecubital; Blood  Result Value Ref Range Status   Specimen Description LEFT ANTECUBITAL  Final   Special Requests   Final    BOTTLES DRAWN AEROBIC ONLY Blood Culture results may not be optimal due to an inadequate volume of blood received in culture bottles   Culture   Final    NO GROWTH 3 DAYS Performed at [facility], [address] [place], [place] [address]    Report Status PENDING  Incomplete  Urine culture     Status: Abnormal (Preliminary result)   Collection Time: 10/17/19  2:26 PM   Specimen: In/Out Cath Urine  Result Value Ref Range Status   Specimen Description   Final    IN/OUT CATH URINE Performed at [facility], [address] [place], [place] [address]    Special Requests   Final    NONE Performed at [facility], [address] [place], [place] [address]    Culture (A)  Final    20,000 COLONIES/mL KLEBSIELLA PNEUMONIAE SUSCEPTIBILITIES TO FOLLOW Performed at [facility], [address] [place], [place] [address]    Report Status PENDING  Incomplete  Aerobic/Anaerobic Culture (surgical/deep wound)     Status: None (Preliminary result)   Collection Time: 10/17/19  6:14 PM   Specimen: Abscess  Result Value Ref Range Status   Specimen Description   Final    ABSCESS Performed at [facility], [address] [place], [place] [address]    Special Requests   Final     RIGHT Performed at [facility], [address] [place], [place] [address]    Gram Stain NO WBC SEEN NO ORGANISMS SEEN   Final   Culture   Final    CULTURE REINCUBATED FOR BETTER GROWTH Performed at [facility], [address] [place], [place] [address]    Report Status PENDING  Incomplete  Aerobic/Anaerobic Culture (surgical/deep wound)     Status: None (Preliminary result)   Collection Time: 10/17/19  6:14 PM   Specimen: Abscess  Result Value Ref Range Status   Specimen Description   Final    ABSCESS Performed at [facility], [address] [place], [place] [address]    Special Requests   Final    LEFT Performed at [facility], [address] [place], [place] [address]    Gram Stain   Final    FEW WBC PRESENT,BOTH PMN AND MONONUCLEAR RARE GRAM VARIABLE ROD RARE GRAM POSITIVE COCCI RARE YEAST    Culture   Final    CULTURE REINCUBATED FOR BETTER GROWTH Performed at [facility], [address] [place], [place] [address]    Report Status PENDING  Incomplete     Scheduled Meds: . aspirin EC  81 mg Oral Daily  . atorvastatin  80 mg Oral Daily  . clopidogrel  75 mg Oral Daily  . collagenase   Topical BID  . folic acid  1 mg Oral Daily  . heparin injection (subcutaneous)  5,000 Units Subcutaneous Q8H  . levothyroxine  75 mcg Oral QAC breakfast  . memantine  10 mg Oral BID  . vitamin B-12  500 mcg Oral Daily   Continuous Infusions: . ceFEPime (MAXIPIME) IV 2 g (10/18/19 2354)  . dextrose 75 mL/hr at 10/19/19 0730  . metronidazole 500 mg (10/19/19 1032)  .  vancomycin 1,250 mg (10/18/19 0128)    Procedures/Studies: CT ABDOMEN PELVIS WO CONTRAST  Result Date: 10/22/19 CLINICAL DATA:  Decubitus ulcers EXAM: CT ABDOMEN AND PELVIS WITHOUT CONTRAST TECHNIQUE: Multidetector CT imaging of the abdomen and pelvis was performed following the standard protocol without IV contrast. COMPARISON:  None. FINDINGS: Lower chest: No acute abnormality. Hepatobiliary:  Scattered calcifications are noted within the liver. Gallbladder is well distended with multiple dependent gallstones. Pancreas: Unremarkable. No pancreatic ductal dilatation or surrounding inflammatory changes. Spleen: Normal in size without focal abnormality. Adrenals/Urinary Tract: Adrenal glands are within normal limits. Kidneys are well visualized bilaterally with renal vascular calcifications. No renal stones are seen. No obstructive changes are noted. The bladder is partially distended. Stomach/Bowel: The appendix is not well visualized although no inflammatory changes to suggest appendicitis are noted. Retained fecal material is noted throughout the colon consistent with a mild degree of constipation without obstructive change. No small bowel abnormality is seen. Stomach is unremarkable. Vascular/Lymphatic: Aortic atherosclerosis. No enlarged abdominal or pelvic lymph nodes. Reproductive: Uterus and bilateral adnexa are unremarkable. Other: No abdominal wall hernia or abnormality. No abdominopelvic ascites. Musculoskeletal: Mild changes of anasarca are noted. Decubitus ulcer is noted laterally near the right hip with inflammatory changes extending from the skin towards the greater trochanter. This area measures approximately 7.6 x 4.3 cm. No definitive bony erosive changes to suggest osteomyelitis are noted at this time. No other definitive decubitus ulcer is seen. Degenerative changes of lumbar spine are seen. IMPRESSION: Right lateral decubitus ulcer with subcutaneous inflammatory changes extending towards the greater trochanter of the right proximal femur. No definitive bony destructive changes are seen. Considerable subcutaneous inflammatory changes noted with air tracking towards the lateral aspect of the femur. Changes of mild constipation. Cholelithiasis without complicating factors. Electronically Signed   By: Alcide Clever M.D.   On: 10/22/19 16:51   US RENAL  Result Date: 10/18/2019 CLINICAL  DATA:  Acute renal insufficiency. EXAM: RENAL / URINARY TRACT ULTRASOUND COMPLETE COMPARISON:  October 22, 2019 CT FINDINGS: Right Kidney: Renal measurements: 9.3 x 4.4 x 5.8 cm = volume: 123 mL. Increased renal echogenicity. No hydronephrosis. Left Kidney: Renal measurements: 9.6 x 5.2 x 4.6 cm = volume: 119 mL. Increased renal echogenicity. No hydronephrosis. Bladder: Decompressed. Other: Trace perinephric edema/fluid likely relates to renal insufficiency. Note is made of gallbladder sludge and stones including at 1.0 cm. IMPRESSION: 1. No hydronephrosis. Increased renal echogenicity, consistent with medical renal disease. 2. Cholelithiasis without acute cholecystitis. Electronically Signed   By: Jeronimo Greaves M.D.   On: 10/18/2019 11:51   DG Chest Port 1 View  Result Date: 2019/10/22 CLINICAL DATA:  Sepsis EXAM: PORTABLE CHEST 1 VIEW COMPARISON:  11/05/2014 FINDINGS: Stable positioning of a left-sided implanted cardiac device. Post CABG changes. Stable cardiomediastinal contours. No focal airspace consolidation, pleural effusion, or pneumothorax. IMPRESSION: No acute cardiopulmonary findings. Electronically Signed   By: Duanne Guess D.O.   On: 10/22/19 14:47    Catarina Hartshorn, DO  [facility]  If 7PM-7AM, please contact night-coverage [email or web address] Password [email or web address] 10/19/2019, 12:22 PM   LOS: 3 days

## 2019-10-19 NOTE — Progress Notes (Signed)
[facility] Progress Note     Subjective: Patient was somnolent, but responsive to voice commands. She was unable to provide history secondary to her dementia. Was in no apparent distress. When performing wound examination, patient began experiencing pain.  Objective: Vital signs in last 24 hours: Temp:  [97.9 F (36.6 C)-98 F (36.7 C)] 98 F (36.7 C) (03/12 0512) Pulse Rate:  [80] 80 (03/12 0512) Resp:  [20] 20 (03/12 0512) BP: (94-96)/(42-61) 96/61 (03/12 0512) SpO2:  [94 %-100 %] 100 % (03/12 0512) Last BM Date: 10/18/19  Intake/Output from previous day: 03/11 0701 - 03/12 0700 In: 1304.1 [P.O.:60; I.V.:1044.1; IV Piggyback:200] Out: 300 [Urine:300] Intake/Output this shift: Total I/O In: 60 [P.O.:60] Out: -   General appearance: cooperative and in no acute distress. Somnolent. Cardio: regular rate and rhythm, S1, S2 normal, no murmur, click, rub or gallop  Lab Results:  Recent Labs    10/18/19 0611 10/19/19 0500  WBC 24.5* 19.5*  HGB 8.2* 8.2*  HCT 28.6* 29.0*  PLT 226 197   BMET Recent Labs    10/18/19 0611 10/19/19 0500  NA 142 138  K 4.1 4.3  CL 112* 111  CO2 20* 15*  GLUCOSE 188* 196*  BUN 56* 60*  CREATININE 1.81* 1.74*  CALCIUM 7.1* 6.7*   PT/INR Recent Labs    2019-11-09 1515  LABPROT 14.0  INR 1.1    Studies/Results: US RENAL  Result Date: 10/18/2019 CLINICAL DATA:  Acute renal insufficiency. EXAM: RENAL / URINARY TRACT ULTRASOUND COMPLETE COMPARISON:  2019/11/09 CT FINDINGS: Right Kidney: Renal measurements: 9.3 x 4.4 x 5.8 cm = volume: 123 mL. Increased renal echogenicity. No hydronephrosis. Left Kidney: Renal measurements: 9.6 x 5.2 x 4.6 cm = volume: 119 mL. Increased renal echogenicity. No hydronephrosis. Bladder: Decompressed. Other: Trace perinephric edema/fluid likely relates to renal insufficiency. Note is made of gallbladder sludge and stones including at 1.0 cm. IMPRESSION: 1. No hydronephrosis. Increased renal  echogenicity, consistent with medical renal disease. 2. Cholelithiasis without acute cholecystitis. Electronically Signed   By: Abigail Miyamoto M.D.   On: 10/18/2019 11:51    Anti-infectives: Anti-infectives (From admission, onward)   Start     Dose/Rate Route Frequency Ordered Stop   10/17/19 2359  vancomycin (VANCOREADY) IVPB 1250 mg/250 mL     1,250 mg 166.7 mL/hr over 90 Minutes Intravenous Every 48 hours Nov 09, 2019 2022     2019/11/09 2359  metroNIDAZOLE (FLAGYL) IVPB 500 mg     500 mg 100 mL/hr over 60 Minutes Intravenous Every 8 hours 11-09-2019 2047     09-Nov-2019 2359  ceFEPIme (MAXIPIME) 2 g in sodium chloride 0.9 % 100 mL IVPB     2 g 200 mL/hr over 30 Minutes Intravenous Every 24 hours 09-Nov-2019 2012     11-09-2019 1430  vancomycin (VANCOCIN) IVPB 1000 mg/200 mL premix     1,000 mg 200 mL/hr over 60 Minutes Intravenous  Once November 09, 2019 1428 2019-11-09 1713   11-09-19 1430  piperacillin-tazobactam (ZOSYN) IVPB 3.375 g     3.375 g 100 mL/hr over 30 Minutes Intravenous  Once 11/09/2019 1428 11-09-19 1607      Assessment/Plan: s/p surgical debridement of left and right lateral decubitus ulcers, day 2.  Impression: On inspection, wounds have begun to heal well over the past 2 days following surgical debridement intervention. No obvious signs of additional necrosis or progressive ulceration.  Plan: No need for additional debridement at this time. Continue to Santyl and Bid dressing changes as appropriate. Will assess within  in the next few days to monitor wound progression.    LOS: 3 days    Eliezer Lofts 10/19/2019

## 2019-10-19 NOTE — Care Management Important Message (Signed)
Important Message  Patient Details  Name: [name] MRN: [id number] Date of Birth: 1934/11/23   Medicare Important Message Given:  Yes     Corey Harold 10/19/2019, 1:57 PM

## 2019-10-19 NOTE — TOC Initial Note (Addendum)
Transition of Care [facility]) - Initial/Assessment Note    Patient Details  Name: [name] MRN: [id number] Date of Birth: 05-01-1935  Transition of Care [facility]) CM/SW Contact:    Leitha Bleak, RN Phone Number: 10/19/2019, 1:03 PM  Clinical Narrative:       Patient admitted for sepsis. Lives in [place] with her husband. Patient needs comprehensive wound care.  Husband refusing SNF due to not being allowed to visit. Discussed Home Health and given choices. Multiple calls made for [facility] insurance.  [facility] in [place] will accept orders.  Faxed for their review. TOC to follow.           Addendum:- [facility] could not accept due to insurance. CM calling multiple HH. Page with [facility] accepted. Orders and clinicals faxed. TOC to call with actual discharge, so they can began care.   Expected Discharge Plan: Home w Home Health Services Barriers to Discharge: Continued Medical Work up   Patient Goals and CMS Choice Patient states their goals for this hospitalization and ongoing recovery are:: to go home. CMS Medicare.gov Compare Post Acute Care list provided to:: Patient Represenative (must comment) Choice offered to / list presented to : Spouse  Expected Discharge Plan and Services Expected Discharge Plan: Home w Home Health Services   Discharge Planning Services: CM Consult   Living arrangements for the past 2 months: Single Family Home                    HH Arranged: RN, PT, Social Work [employer] Agency: [facility] Date [facility] Agency Contacted: 10/19/19 Time HH Agency Contacted: 1302 Representative spoke with at [facility] Agency: Faxed  Prior Living Arrangements/Services Living arrangements for the past 2 months: Single Family Home Lives with:: Spouse Patient language and need for interpreter reviewed:: Yes Do you feel safe going back to the place where you live?: Yes      Need for Family Participation in Patient Care: Yes (Comment) Care giver  support system in place?: Yes (comment) Current home services: DME Criminal Activity/Legal Involvement Pertinent to Current Situation/Hospitalization: No - Comment as needed  Activities of Daily Living Home Assistive Devices/Equipment: (UTA due to AMS) ADL Screening (condition at time of admission) Patient's cognitive ability adequate to safely complete daily activities?: No Is the patient deaf or have difficulty hearing?: No Does the patient have difficulty seeing, even when wearing glasses/contacts?: No Does the patient have difficulty concentrating, remembering, or making decisions?: Yes Patient able to express need for assistance with ADLs?: No Does the patient have difficulty dressing or bathing?: Yes Independently performs ADLs?: No Communication: Independent Dressing (OT): Needs assistance Is this a change from baseline?: Pre-admission baseline Grooming: Needs assistance Is this a change from baseline?: Pre-admission baseline Feeding: Needs assistance Is this a change from baseline?: Pre-admission baseline Bathing: Needs assistance Is this a change from baseline?: Pre-admission baseline Toileting: Needs assistance Is this a change from baseline?: Pre-admission baseline In/Out Bed: Needs assistance Is this a change from baseline?: Pre-admission baseline Walks in Home: Dependent Is this a change from baseline?: Pre-admission baseline Does the patient have difficulty walking or climbing stairs?: Yes Weakness of Legs: Both Weakness of Arms/Hands: Both  Permission Sought/Granted      Share Information with NAME: Sherian Maroon     Permission granted to share info w Relationship: Husband     Emotional Assessment     Affect (typically observed): Accepting   Alcohol / Substance Use: Not Applicable Psych Involvement: No (  comment)  Admission diagnosis:  Sepsis ([place]) [A41.9] Pressure injury of skin of contiguous region involving back, buttock, and hip, unspecified injury stage,  unspecified laterality [L89.40] Sepsis with acute renal failure without septic shock, due to unspecified organism, unspecified acute renal failure type ([place]) [A41.9, R65.20, N17.9] Patient Active Problem List   Diagnosis Date Noted  . Bacteremia due to Gram-negative bacteria 10/18/2019  . Gram negative sepsis ([place]) 10/18/2019  . AKI (acute kidney injury) ([place])   . Goals of care, counseling/discussion   . Palliative care by specialist   . Encounter for hospice care discussion   . Sepsis due to undetermined organism ([place]) 10/17/2019  . Failure to thrive in adult 10/17/2019  . Decubitus ulcer of left hip, stage 3 ([place]) 10/17/2019  . Stage I pressure ulcer of sacral region   . Sepsis ([place]) November 07, 2019  . Decubitus ulcer of right hip, stage 4 ([place]) Nov 07, 2019  . Polymorphic ventricular tachycardia ([place]) 08/15/2015  . S/P ICD (internal cardiac defibrillator) procedure, with upgrade to ICD/CRT-D  [employer] 11/06/2014  . Cardiomyopathy, ischemic EF 25-30% 11/06/2014  . Weakness 11/06/2014  . AICD (automatic cardioverter/defibrillator) present   . Torsades de pointes with 3 shocks, another pace terminated 10/31/2014  . Mobitz type 2 second degree atrioventricular block 10/31/2014  . Complete heart block ([place]) 10/31/2014  . CAD (coronary artery disease) 04/13/2012  . Acute systolic CHF (congestive heart failure) ([place]) 04/12/2012  . Hypertension   . High cholesterol   . Type II diabetes mellitus (HCC)    PCP:  [name], MD

## 2019-10-19 NOTE — Progress Notes (Addendum)
Pharmacy Antibiotic Note  Today is day #4 of vancomycin and cefepime therapy for this 85 yo female admitted on 10/17/2019 with sepsis likely due to chronic decubitus ulcers.  Wound culture grew E. Coli and P.mirabilis, both sensitive to cefepime.  Aerobic bottle of blood culture grew Staph epidermidis and Bacteroides fragilis (sens to metronidazole). Patient is currently afebrile, but WBC count is still elevated.  Renal function remains unstable.  Plan:   Continue Cefepime 2gm IV q24h Vancomycin 1250 mg IV q48h . Goal AUC 400-550.  Expected AUC: 539  SCr used: 1.73  Consider vancomycin peak /trough depending on length of tx  F/U de-escalation of antibiotic tx   Height: 5\' 4"  (162.6 cm) Weight: 157 lb 3 oz (71.3 kg) IBW/kg (Calculated) : 54.7  Temp (24hrs), Avg:98.1 F (36.7 C), Min:97.9 F (36.6 C), Max:98.5 F (36.9 C)  Recent Labs  Lab 10/29/2019 1515 10/30/2019 1740 10/17/19 0607 10/18/19 0611 10/18/19 0644 10/19/19 0500  WBC 21.7*  --  25.6* 24.5*  --  19.5*  CREATININE 1.83*  --  1.67* 1.81*  --  1.74*  LATICACIDVEN 3.1* 2.3*  --   --  2.3*  --     Estimated Creatinine Clearance: 24.1 mL/min (A) (by C-G formula based on SCr of 1.74 mg/dL (H)).    No Known Allergies  Antimicrobials this admission: 3/9 Zosyn x 1 dose 3/9 Vancomycin >>  3/9 Cefepime >> 3/9 Metronidazole >>     Microbiology results:  3/9 BC x2: Steph epi 3/9 Wound Cx:  E. Coli, P. mirab--sensitive to to cefepime 3/9 Resp PCR: SARS CoV-2 negative 3/10 UC: 20K CFU K. pneu  Thank you for allowing pharmacy to be a part of this patient's care.  5/9, PharmD 10/19/2019 2:39 PM

## 2019-10-19 NOTE — Progress Notes (Signed)
No urine output recorded from tonight or previous shift. Bladder scan results of 239cc. MD notified. In and Out cath ordered and performed. 300cc of cloudy, amber, urine in return.

## 2019-10-20 ENCOUNTER — Other Ambulatory Visit: Payer: Self-pay

## 2019-10-20 ENCOUNTER — Inpatient Hospital Stay: Payer: Self-pay

## 2019-10-20 DIAGNOSIS — K921 Melena: Secondary | ICD-10-CM

## 2019-10-20 LAB — BASIC METABOLIC PANEL
Anion gap: 10 (ref 5–15)
BUN: 60 mg/dL — ABNORMAL HIGH (ref 8–23)
CO2: 17 mmol/L — ABNORMAL LOW (ref 22–32)
Calcium: 6.7 mg/dL — ABNORMAL LOW (ref 8.9–10.3)
Chloride: 107 mmol/L (ref 98–111)
Creatinine, Ser: 1.82 mg/dL — ABNORMAL HIGH (ref 0.44–1.00)
GFR calc Af Amer: 29 mL/min — ABNORMAL LOW (ref >60)
GFR calc non Af Amer: 25 mL/min — ABNORMAL LOW (ref >60)
Glucose, Bld: 113 mg/dL — ABNORMAL HIGH (ref 70–99)
Potassium: 4.2 mmol/L (ref 3.5–5.1)
Sodium: 134 mmol/L — ABNORMAL LOW (ref 135–145)

## 2019-10-20 LAB — CBC
HCT: 27.3 % — ABNORMAL LOW (ref 36.0–46.0)
Hemoglobin: 7.9 g/dL — ABNORMAL LOW (ref 12.0–15.0)
MCH: 29.2 pg (ref 26.0–34.0)
MCHC: 28.9 g/dL — ABNORMAL LOW (ref 30.0–36.0)
MCV: 100.7 fL — ABNORMAL HIGH (ref 80.0–100.0)
Platelets: 206 10*3/uL (ref 150–400)
RBC: 2.71 MIL/uL — ABNORMAL LOW (ref 3.87–5.11)
RDW: 15.2 % (ref 11.5–15.5)
WBC: 22.1 10*3/uL — ABNORMAL HIGH (ref 4.0–10.5)
nRBC: 0.1 % (ref 0.0–0.2)

## 2019-10-20 LAB — AEROBIC CULTURE W GRAM STAIN (SUPERFICIAL SPECIMEN): Special Requests: NORMAL

## 2019-10-20 LAB — BLOOD GAS, ARTERIAL
Acid-base deficit: 9 mmol/L — ABNORMAL HIGH (ref 0.0–2.0)
Bicarbonate: 17.1 mmol/L — ABNORMAL LOW (ref 20.0–28.0)
FIO2: 28
O2 Saturation: 95 %
Patient temperature: 35
pCO2 arterial: 32.7 mmHg (ref 32.0–48.0)
pH, Arterial: 7.31 — ABNORMAL LOW (ref 7.350–7.450)
pO2, Arterial: 76.1 mmHg — ABNORMAL LOW (ref 83.0–108.0)

## 2019-10-20 LAB — URINE CULTURE: Culture: 20000 — AB

## 2019-10-20 LAB — GLUCOSE, CAPILLARY
Glucose-Capillary: 94 mg/dL (ref 70–99)
Glucose-Capillary: 127 mg/dL — ABNORMAL HIGH (ref 70–99)

## 2019-10-20 LAB — MRSA PCR SCREENING: MRSA by PCR: NEGATIVE

## 2019-10-20 MED ORDER — LEVOTHYROXINE SODIUM 100 MCG/5ML IV SOLN
37.5000 ug | Freq: Every day | INTRAVENOUS | Status: DC
  Administered 2019-10-21 – 2019-11-16 (×23): 37.5 ug via INTRAVENOUS
  Filled 2019-10-20 (×23): qty 5

## 2019-10-20 MED ORDER — SODIUM CHLORIDE 0.9% IV SOLUTION: Freq: Once | INTRAVENOUS | Status: DC

## 2019-10-20 MED ORDER — SODIUM CHLORIDE 0.9 % IV BOLUS
1000.0000 mL | Freq: Once | INTRAVENOUS | Status: AC
  Administered 2019-10-20: 1000 mL via INTRAVENOUS

## 2019-10-20 MED ORDER — LORAZEPAM 2 MG/ML IJ SOLN
0.5000 mg | Freq: Four times a day (QID) | INTRAMUSCULAR | Status: DC | PRN
  Administered 2019-10-20: 13:00:00 0.5 mg via INTRAVENOUS
  Filled 2019-10-20: qty 1

## 2019-10-20 MED ORDER — NOREPINEPHRINE 4 MG/250ML-% IV SOLN
INTRAVENOUS | Status: AC
  Filled 2019-10-20: qty 250

## 2019-10-20 MED ORDER — CHLORHEXIDINE GLUCONATE CLOTH 2 % EX PADS
6.0000 | MEDICATED_PAD | Freq: Every day | CUTANEOUS | Status: DC
  Administered 2019-10-20 – 2019-11-16 (×27): 6 via TOPICAL

## 2019-10-20 MED ORDER — NOREPINEPHRINE 4 MG/250ML-% IV SOLN
0.0000 ug/min | INTRAVENOUS | Status: DC
  Administered 2019-10-20: 17 ug/min via INTRAVENOUS
  Administered 2019-10-20: 2 ug/min via INTRAVENOUS
  Administered 2019-10-21: 15.013 ug/min via INTRAVENOUS
  Administered 2019-10-21: 20 ug/min via INTRAVENOUS
  Administered 2019-10-21: 17.5 ug/min via INTRAVENOUS
  Administered 2019-10-21: 07:00:00 22 ug/min via INTRAVENOUS
  Administered 2019-10-21: 21 ug/min via INTRAVENOUS
  Filled 2019-10-20 (×7): qty 250

## 2019-10-20 MED ORDER — SODIUM CHLORIDE 0.9 % IV SOLN
INTRAVENOUS | Status: DC | PRN
  Administered 2019-10-20: 250 mL via INTRAVENOUS

## 2019-10-20 NOTE — Progress Notes (Signed)
PROGRESS NOTE  Kelly PersonDella [name] WUJ:[id number] DOB: June 02, 1935 DOA: 10/31/2019 PCP: Kirstie PeriShah, Ashish, MD   Brief History: 85 year old female with a history of Alzheimer's dementia, coronary artery disease, systolic CHF with EF 20 to 25%, torsades VT status post AICD, hypertension, diabetes mellitus type 2, hyperlipidemia presenting with increasing somnolence and poor oral intake. The patient is unable to provide any significant history secondary to her dementia. History is supplemented by the patient's spouse at the bedside. He states that over the past month he has noticed worsening decubitus ulcers for which he has been caring at home. He is the primary caregiver. She has not had fevers, chills, chest pain, shortness of breath, nausea, vomiting, diarrhea. However over the past month he has noticed increasing somnolence and decreased oral intake as well as increasing pain. In addition, the spouse has noted a worsening functional and cognitive decline over the last 4 months. She has been essentially bedbound for at least the last 4 to 5 months. The patient was afebrile hemodynamically stable with oxygen saturation 100%. Lactic acid was 3.1 with WBC 21.7. The patient was started on IV antibiotics.  Assessment/Plan: Sepsis -Present on admission -Secondary to infected decubitus ulcers, bacteremia, possible UTI -Lactic acid 3.1 -Continue vancomycin, cefepime, metronidazole  Bacteremia--B.fragilis -10/14/2019 blood culture--GNR, GPC -there is likely uncultureable/nonviable GNR  Infected decubitus ulcers -10/20/2019 CT abdomen/pelvis--right lateral decubitus ulcer with subcutaneous inflammatory changes extending toward the greater trochanter. There is no bony destructive changes. There is considerable inflammatory change with air tracking toward the lateral aspect of the femur. -General surgery consultappreciated -10/17/19--bedside debridement -Continue empiric antibiotics--following  cultures  UTI--Klebsiella PNA -UA >50wbc -already on abx for sepsis/decubiti  Failure to thrive -Serum B12--199-->supplement -TSH--37.364, Free T4--0.28 -Folate--7.2 -PT evaluation-->SNF -continues to have poor po intake  Hypernatremia -Discontinue half-normal saline -continueD5W-->improved -due to continued poor po intake-->restart NS  CKD stage 3b -initially felt to be AKI but now more likely progression of CKD -pt likely has progression of underlying CKD -renal US-->medical renal disease  Chronic systolic CHF -02/2015 echo EF 20-25%, G2DD -Judicious IV fluids  Alzheimer's dementia -Continue Namenda -spouse states pt screams for attention frequently at home  Coronary artery disease -hx of CABG in 2009 (LIMA-LAD, SVG-ramus, SVG-OM). Hx of RCA stent 04/2012 -no chest pain presently -Continue aspirin and Plavix  Ventricular Tachycardia - she has CRT-D device followed by EP - she underwent ICD upgrade of single chamber ICD to [employer] CRT-D  Hyperlipidemia -continue statin  HTN -holding coreg/hydralazine due to soft BPs  Bilateral hip and sacral decubiti--unstageable -present on admission -appreciate surgical eval -s/p bedside debridement 10/17/19      Disposition Plan: Patient From: Home D/C Place: SNF Barriers: Not Clinically Stable--poor po intake, low BPs, awaiting final cultures and wound improvement  Family Communication:Spouse updatedat bedside 3/13  Consultants:General surgery; palliative medicine  Code Status: DNR  DVT Prophylaxis: [place] Heparin    Procedures: As Listed in Progress Note Above  Antibiotics: vanc 3/9>> Cefepime 3/9>> Metronidazole 3/9>>   Total time spent 35 minutes.  Greater than 50% spent face to face counseling and coordinating care.     Subjective: Pt is pleasantly confused.  Denies cp, sob,abd pain.  Remainder ROS unobtainable due to dementia  Objective: Vitals:   10/20/19  0053 10/20/19 0300 10/20/19 0540 10/20/19 0617  BP: 90/65 92/65 99/75    Pulse: 74 72 80 77  Resp: 16 16 16    Temp:   97.6 F (36.4 C)  TempSrc:      SpO2: 100% 100% (!) 79% 100%  Weight:      Height:        Intake/Output Summary (Last 24 hours) at 10/20/2019 1135 Last data filed at 10/20/2019 0500 Gross per 24 hour  Intake 1595.54 ml  Output 900 ml  Net 695.54 ml   Weight change:  Exam:   General:  Pt is alert, follows commands appropriately, not in acute distress  HEENT: No icterus, No thrush, No neck mass, [place]/AT  Cardiovascular: RRR, S1/S2, no rubs, no gallops  Respiratory: bibasilar crackles. No wheeze  Abdomen: Soft/+BS, non tender, non distended, no guarding  Extremities: trace LE edema, No lymphangitis, No petechiae, No rashes, no synovitis   Data Reviewed: I have personally reviewed following labs and imaging studies Basic Metabolic Panel: Recent Labs  Lab 2019/10/19 1515 10/19/19 1740 10/17/19 0607 10/18/19 0611 10/19/19 0500 10/20/19 0709  NA 147*  --  148* 142 138 134*  K 4.4  --  4.3 4.1 4.3 4.2  CL 112*  --  116* 112* 111 107  CO2 24  --  20* 20* 15* 17*  GLUCOSE 195*  --  166* 188* 196* 113*  BUN 54*  --  54* 56* 60* 60*  CREATININE 1.83*  --  1.67* 1.81* 1.74* 1.82*  CALCIUM 7.9*  --  7.6* 7.1* 6.7* 6.7*  MG  --  2.4  --  2.3  --   --    Liver Function Tests: Recent Labs  Lab Oct 19, 2019 1515  AST 20  ALT 13  ALKPHOS 81  BILITOT 0.8  PROT 6.6  ALBUMIN 2.7*   No results for input(s): LIPASE, AMYLASE in the last 168 hours. No results for input(s): AMMONIA in the last 168 hours. Coagulation Profile: Recent Labs  Lab Oct 19, 2019 1515  INR 1.1   CBC: Recent Labs  Lab 10-19-19 1515 10/19/2019 1515 10/17/19 0607 10/17/19 1455 10/18/19 0611 10/19/19 0500 10/20/19 0709  WBC 21.7*  --  25.6*  --  24.5* 19.5* 22.1*  NEUTROABS 19.7*  --   --   --   --   --   --   HGB 8.6*  --  9.4*  --  8.2* 8.2* 7.9*  HCT 28.9*   < > 32.6* 27.9* 28.6*  29.0* 27.3*  MCV 100.3*  --  104.8*  --  102.5* 103.6* 100.7*  PLT 299  --  260  --  226 197 206   < > = values in this interval not displayed.   Cardiac Enzymes: Recent Labs  Lab 10/19/19 0500  CKTOTAL 122   BNP: Invalid input(s): POCBNP CBG: Recent Labs  Lab 10/19/19 0757 10/19/19 1159 10/19/19 1715 10/19/19 2256 10/20/19 0726  GLUCAP 169* 185* 213* 165* 94   HbA1C: Recent Labs    10/19/19 0500  HGBA1C 5.5   Urine analysis:    Component Value Date/Time   COLORURINE AMBER (A) 10/17/2019 1426   APPEARANCEUR CLOUDY (A) 10/17/2019 1426   LABSPEC 1.021 10/17/2019 1426   PHURINE 5.0 10/17/2019 1426   GLUCOSEU NEGATIVE 10/17/2019 1426   HGBUR SMALL (A) 10/17/2019 1426   BILIRUBINUR NEGATIVE 10/17/2019 1426   KETONESUR 5 (A) 10/17/2019 1426   PROTEINUR 30 (A) 10/17/2019 1426   NITRITE NEGATIVE 10/17/2019 1426   LEUKOCYTESUR MODERATE (A) 10/17/2019 1426   Sepsis Labs: @LABRCNTIP (procalcitonin:4,lacticidven:4) ) Recent Results (from the past 240 hour(s))  Wound or Superficial Culture     Status: None   Collection Time: 19-Oct-2019  2:28 PM  Specimen: Wound  Result Value Ref Range Status   Specimen Description   Final    WOUND RIGHT ILIAC Performed at [facility], [address] [place], [place] [address]    Special Requests   Final    Normal Performed at [facility], [address], [place], [place] [address]    Gram Stain   Final    RARE WBC PRESENT, PREDOMINANTLY PMN ABUNDANT GRAM NEGATIVE RODS FEW GRAM POSITIVE COCCI RARE GRAM POSITIVE RODS Performed at [facility], [address], [place], [place] [address]    Culture   Final    ABUNDANT ESCHERICHIA COLI ABUNDANT PROTEUS MIRABILIS    Report Status 10/20/2019 FINAL  Final   Organism ID, Bacteria ESCHERICHIA COLI  Final   Organism ID, Bacteria PROTEUS MIRABILIS  Final      Susceptibility   Escherichia coli - MIC*    AMPICILLIN >=32 RESISTANT Resistant     CEFAZOLIN >=64 RESISTANT Resistant      CEFEPIME <=0.12 SENSITIVE Sensitive     CEFTAZIDIME <=1 SENSITIVE Sensitive     CEFTRIAXONE <=0.25 SENSITIVE Sensitive     CIPROFLOXACIN <=0.25 SENSITIVE Sensitive     GENTAMICIN <=1 SENSITIVE Sensitive     IMIPENEM <=0.25 SENSITIVE Sensitive     TRIMETH/SULFA <=20 SENSITIVE Sensitive     AMPICILLIN/SULBACTAM >=32 RESISTANT Resistant     PIP/TAZO <=4 SENSITIVE Sensitive     * ABUNDANT ESCHERICHIA COLI   Proteus mirabilis - MIC*    AMPICILLIN <=2 SENSITIVE Sensitive     CEFAZOLIN <=4 SENSITIVE Sensitive     CEFEPIME <=0.12 SENSITIVE Sensitive     CEFTAZIDIME <=1 SENSITIVE Sensitive     CEFTRIAXONE <=0.25 SENSITIVE Sensitive     CIPROFLOXACIN <=0.25 SENSITIVE Sensitive     GENTAMICIN <=1 SENSITIVE Sensitive     IMIPENEM 4 SENSITIVE Sensitive     TRIMETH/SULFA <=20 SENSITIVE Sensitive     AMPICILLIN/SULBACTAM <=2 SENSITIVE Sensitive     PIP/TAZO <=4 SENSITIVE Sensitive     * ABUNDANT PROTEUS MIRABILIS  Blood Culture (routine x 2)     Status: Abnormal   Collection Time: 10/25/2019  3:15 PM   Specimen: BLOOD  Result Value Ref Range Status   Specimen Description   Final    BLOOD RIGHT ANTECUBITAL Performed at [facility], [address], [place], [place] [address]    Special Requests   Final    BOTTLES DRAWN AEROBIC AND ANAEROBIC Blood Culture adequate volume Performed at [facility], [address], [place], [place] [address]    Culture  Setup Time   Final    GRAM NEGATIVE RODS Gram Stain Report Called to,Read Back By and Verified With: OLIVER,J ON 10/17/19 AT 1910 BY LOY,C PERFORMED AT [facility] ANAEROBIC BOTTLE GRAM POSITIVE COCCI AEROBIC BOTTLE ONLY Gram Stain Report Called to,Read Back By and Verified With: H EVANS,RN@[id number]  10/18/19 MKELLY    Culture (A)  Final    STAPHYLOCOCCUS EPIDERMIDIS THE SIGNIFICANCE OF ISOLATING THIS ORGANISM FROM A SINGLE SET OF BLOOD CULTURES WHEN MULTIPLE SETS ARE DRAWN IS UNCERTAIN. PLEASE NOTIFY THE MICROBIOLOGY DEPARTMENT WITHIN ONE WEEK IF  SPECIATION AND SENSITIVITIES ARE REQUIRED. BACTEROIDES FRAGILIS BETA LACTAMASE POSITIVE Performed at [facility], [address] [place], [place] [address]    Report Status 10/19/2019 FINAL  Final  Blood Culture ID Panel (Reflexed)     Status: None   Collection Time: 11/03/2019  3:15 PM  Result Value Ref Range Status   Enterococcus species NOT DETECTED NOT  DETECTED Final   Listeria monocytogenes NOT DETECTED NOT DETECTED Final   Staphylococcus species NOT DETECTED NOT DETECTED Final   Staphylococcus aureus (BCID) NOT DETECTED NOT DETECTED Final   Streptococcus species NOT DETECTED NOT DETECTED Final   Streptococcus agalactiae NOT DETECTED NOT DETECTED Final   Streptococcus pneumoniae NOT DETECTED NOT DETECTED Final   Streptococcus pyogenes NOT DETECTED NOT DETECTED Final   Acinetobacter baumannii NOT DETECTED NOT DETECTED Final   Enterobacteriaceae species NOT DETECTED NOT DETECTED Final   Enterobacter cloacae complex NOT DETECTED NOT DETECTED Final   Escherichia coli NOT DETECTED NOT DETECTED Final   Klebsiella oxytoca NOT DETECTED NOT DETECTED Final   Klebsiella pneumoniae NOT DETECTED NOT DETECTED Final   Proteus species NOT DETECTED NOT DETECTED Final   Serratia marcescens NOT DETECTED NOT DETECTED Final   Haemophilus influenzae NOT DETECTED NOT DETECTED Final   Neisseria meningitidis NOT DETECTED NOT DETECTED Final   Pseudomonas aeruginosa NOT DETECTED NOT DETECTED Final   Candida albicans NOT DETECTED NOT DETECTED Final   Candida glabrata NOT DETECTED NOT DETECTED Final   Candida krusei NOT DETECTED NOT DETECTED Final   Candida parapsilosis NOT DETECTED NOT DETECTED Final   Candida tropicalis NOT DETECTED NOT DETECTED Final    Comment: Performed at [facility], [address], [place], [place] [address]  Respiratory Panel by RT PCR (Flu A&B, Covid) - Nasopharyngeal Swab     Status: None   Collection Time: 2019-11-04  5:00 PM   Specimen: Nasopharyngeal Swab    Result Value Ref Range Status   SARS Coronavirus 2 by RT PCR NEGATIVE NEGATIVE Final    Comment: (NOTE) SARS-CoV-2 target nucleic acids are NOT DETECTED. The SARS-CoV-2 RNA is generally detectable in upper respiratoy specimens during the acute phase of infection. The lowest concentration of SARS-CoV-2 viral copies this assay can detect is 131 copies/mL. A negative result does not preclude SARS-Cov-2 infection and should not be used as the sole basis for treatment or other patient management decisions. A negative result may occur with  improper specimen collection/handling, submission of specimen other than nasopharyngeal swab, presence of viral mutation(s) within the areas targeted by this assay, and inadequate number of viral copies (<131 copies/mL). A negative result must be combined with clinical observations, patient history, and epidemiological information. The expected result is Negative. Fact Sheet for Patients:  https://www.moore.com/ Fact Sheet for Healthcare Providers:  https://www.young.biz/ This test is not yet ap proved or cleared by the [place] FDA and  has been authorized for detection and/or diagnosis of SARS-CoV-2 by FDA under an Emergency Use Authorization (EUA). This EUA will remain  in effect (meaning this test can be used) for the duration of the COVID-19 declaration under Section 564(b)(1) of the Act, 21 U.S.C. section 360bbb-3(b)(1), unless the authorization is terminated or revoked sooner.    Influenza A by PCR NEGATIVE NEGATIVE Final   Influenza B by PCR NEGATIVE NEGATIVE Final    Comment: (NOTE) The Xpert Xpress SARS-CoV-2/FLU/RSV assay is intended as an aid in  the diagnosis of influenza from Nasopharyngeal swab specimens and  should not be used as a sole basis for treatment. Nasal washings and  aspirates are unacceptable for Xpert Xpress SARS-CoV-2/FLU/RSV  testing. Fact Sheet for  Patients: https://www.moore.com/ Fact Sheet for Healthcare Providers: https://www.young.biz/ This test is not yet approved or cleared by the [place] FDA and  has been authorized for detection and/or diagnosis of SARS-CoV-2 by  FDA under an Emergency Use Authorization (EUA). This EUA will  remain  in effect (meaning this test can be used) for the duration of the  Covid-19 declaration under Section 564(b)(1) of the Act, 21  U.S.C. section 360bbb-3(b)(1), unless the authorization is  terminated or revoked. Performed at [facility], [address], [place], [place] [address]   Blood Culture (routine x 2)     Status: None (Preliminary result)   Collection Time: 10/30/2019  5:13 PM   Specimen: Left Antecubital; Blood  Result Value Ref Range Status   Specimen Description LEFT ANTECUBITAL  Final   Special Requests   Final    BOTTLES DRAWN AEROBIC ONLY Blood Culture results may not be optimal due to an inadequate volume of blood received in culture bottles   Culture   Final    NO GROWTH 4 DAYS Performed at [facility], [address], [place], [place] [address]    Report Status PENDING  Incomplete  Urine culture     Status: Abnormal   Collection Time: 10/17/19  2:26 PM   Specimen: In/Out Cath Urine  Result Value Ref Range Status   Specimen Description   Final    IN/OUT CATH URINE Performed at [facility], [address], [place], [place] [address]    Special Requests   Final    NONE Performed at [facility], [address], [place], [place] [address]    Culture 20,000 COLONIES/mL KLEBSIELLA PNEUMONIAE (A)  Final   Report Status 10/20/2019 FINAL  Final   Organism ID, Bacteria KLEBSIELLA PNEUMONIAE (A)  Final      Susceptibility   Klebsiella pneumoniae - MIC*    AMPICILLIN >=32 RESISTANT Resistant     CEFAZOLIN <=4 SENSITIVE Sensitive     CEFTRIAXONE <=0.25 SENSITIVE Sensitive     CIPROFLOXACIN <=0.25 SENSITIVE Sensitive     GENTAMICIN <=1  SENSITIVE Sensitive     IMIPENEM 1 SENSITIVE Sensitive     NITROFURANTOIN 64 INTERMEDIATE Intermediate     TRIMETH/SULFA <=20 SENSITIVE Sensitive     AMPICILLIN/SULBACTAM 8 SENSITIVE Sensitive     PIP/TAZO <=4 SENSITIVE Sensitive     * 20,000 COLONIES/mL KLEBSIELLA PNEUMONIAE  Aerobic/Anaerobic Culture (surgical/deep wound)     Status: None (Preliminary result)   Collection Time: 10/17/19  6:14 PM   Specimen: Abscess  Result Value Ref Range Status   Specimen Description   Final    ABSCESS Performed at [facility], [address], [place], [place] [address]    Special Requests   Final    RIGHT Performed at [facility], [address] [place], [place] [address]    Gram Stain   Final    NO WBC SEEN NO ORGANISMS SEEN Performed at [facility], [address], [place], [place] [address]    Culture FEW ESCHERICHIA COLI  Final   Report Status PENDING  Incomplete  Aerobic/Anaerobic Culture (surgical/deep wound)     Status: None (Preliminary result)   Collection Time: 10/17/19  6:14 PM   Specimen: Abscess  Result Value Ref Range Status   Specimen Description   Final    ABSCESS Performed at [facility], [address], [place], [place] [address]    Special Requests   Final    LEFT Performed at [facility], [address], [place], [place] [address]    Gram Stain   Final    FEW WBC PRESENT,BOTH PMN AND MONONUCLEAR RARE GRAM VARIABLE ROD RARE GRAM POSITIVE COCCI RARE YEAST    Culture   Final    CULTURE REINCUBATED FOR BETTER  GROWTH Performed at [facility], [address] [place], [place] [address]    Report Status PENDING  Incomplete     Scheduled Meds: . aspirin EC  81 mg Oral Daily  . atorvastatin  80 mg Oral Daily  . clopidogrel  75 mg Oral Daily  . collagenase   Topical BID  . folic acid  1 mg Oral Daily  . heparin injection (subcutaneous)  5,000 Units Subcutaneous Q8H  . levothyroxine  75 mcg Oral QAC breakfast  . memantine  10 mg Oral BID  .  vitamin B-12  500 mcg Oral Daily   Continuous Infusions: . ceFEPime (MAXIPIME) IV 2 g (10/20/19 0244)  . metronidazole 500 mg (10/20/19 0913)  . vancomycin 1,250 mg (10/20/19 0038)    Procedures/Studies: CT ABDOMEN PELVIS WO CONTRAST  Result Date: 10/17/2019 CLINICAL DATA:  Decubitus ulcers EXAM: CT ABDOMEN AND PELVIS WITHOUT CONTRAST TECHNIQUE: Multidetector CT imaging of the abdomen and pelvis was performed following the standard protocol without IV contrast. COMPARISON:  None. FINDINGS: Lower chest: No acute abnormality. Hepatobiliary: Scattered calcifications are noted within the liver. Gallbladder is well distended with multiple dependent gallstones. Pancreas: Unremarkable. No pancreatic ductal dilatation or surrounding inflammatory changes. Spleen: Normal in size without focal abnormality. Adrenals/Urinary Tract: Adrenal glands are within normal limits. Kidneys are well visualized bilaterally with renal vascular calcifications. No renal stones are seen. No obstructive changes are noted. The bladder is partially distended. Stomach/Bowel: The appendix is not well visualized although no inflammatory changes to suggest appendicitis are noted. Retained fecal material is noted throughout the colon consistent with a mild degree of constipation without obstructive change. No small bowel abnormality is seen. Stomach is unremarkable. Vascular/Lymphatic: Aortic atherosclerosis. No enlarged abdominal or pelvic lymph nodes. Reproductive: Uterus and bilateral adnexa are unremarkable. Other: No abdominal wall hernia or abnormality. No abdominopelvic ascites. Musculoskeletal: Mild changes of anasarca are noted. Decubitus ulcer is noted laterally near the right hip with inflammatory changes extending from the skin towards the greater trochanter. This area measures approximately 7.6 x 4.3 cm. No definitive bony erosive changes to suggest osteomyelitis are noted at this time. No other definitive decubitus ulcer is  seen. Degenerative changes of lumbar spine are seen. IMPRESSION: Right lateral decubitus ulcer with subcutaneous inflammatory changes extending towards the greater trochanter of the right proximal femur. No definitive bony destructive changes are seen. Considerable subcutaneous inflammatory changes noted with air tracking towards the lateral aspect of the femur. Changes of mild constipation. Cholelithiasis without complicating factors. Electronically Signed   By: Alcide Clever M.D.   On: 11/04/2019 16:51   US RENAL  Result Date: 10/18/2019 CLINICAL DATA:  Acute renal insufficiency. EXAM: RENAL / URINARY TRACT ULTRASOUND COMPLETE COMPARISON:  10/09/2019 CT FINDINGS: Right Kidney: Renal measurements: 9.3 x 4.4 x 5.8 cm = volume: 123 mL. Increased renal echogenicity. No hydronephrosis. Left Kidney: Renal measurements: 9.6 x 5.2 x 4.6 cm = volume: 119 mL. Increased renal echogenicity. No hydronephrosis. Bladder: Decompressed. Other: Trace perinephric edema/fluid likely relates to renal insufficiency. Note is made of gallbladder sludge and stones including at 1.0 cm. IMPRESSION: 1. No hydronephrosis. Increased renal echogenicity, consistent with medical renal disease. 2. Cholelithiasis without acute cholecystitis. Electronically Signed   By: Jeronimo Greaves M.D.   On: 10/18/2019 11:51   DG Chest Port 1 View  Result Date: 10/27/2019 CLINICAL DATA:  Sepsis EXAM: PORTABLE CHEST 1 VIEW COMPARISON:  11/05/2014 FINDINGS: Stable positioning of a left-sided implanted cardiac device. Post CABG changes. Stable cardiomediastinal contours.  No focal airspace consolidation, pleural effusion, or pneumothorax. IMPRESSION: No acute cardiopulmonary findings. Electronically Signed   By: Davina Poke D.O.   On: 08-Nov-2019 14:47    Orson Eva, DO  [facility]  If 7PM-7AM, please contact night-coverage [email or web address] Password [facility] 10/20/2019, 11:35 AM   LOS: 4 days

## 2019-10-20 NOTE — [place] FL2 (Signed)
[place] MEDICAID FL2 LEVEL OF CARE SCREENING TOOL     IDENTIFICATION  Patient Name: [name] Birthdate: June 27, 1935 Sex: female Admission Date (Current Location): 11/04/2019  [facility] and [place] Number:  [employer] and Address:  [facility],  [address] [place] [address]      Provider Number: [id number]  Attending Physician Name and Address:  Catarina Hartshorn, MD  Relative Name and Phone Number:       Current Level of Care: Hospital Recommended Level of Care: Skilled Nursing Facility Prior Approval Number:    Date Approved/Denied:   PASRR Number:    Discharge Plan: SNF    Current Diagnoses: Patient Active Problem List   Diagnosis Date Noted  . Bacteremia due to Gram-negative bacteria 10/18/2019  . Gram negative sepsis (HCC) 10/18/2019  . AKI (acute kidney injury) (HCC)   . Goals of care, counseling/discussion   . Palliative care by specialist   . Encounter for hospice care discussion   . Sepsis due to undetermined organism (HCC) 10/17/2019  . Failure to thrive in adult 10/17/2019  . Decubitus ulcer of left hip, stage 3 (HCC) 10/17/2019  . Stage I pressure ulcer of sacral region   . Sepsis (HCC) 11/06/2019  . Decubitus ulcer of right hip, stage 4 (HCC) 10/10/2019  . Polymorphic ventricular tachycardia (HCC) 08/15/2015  . S/P ICD (internal cardiac defibrillator) procedure, with upgrade to ICD/CRT-D  [employer] 11/06/2014  . Cardiomyopathy, ischemic EF 25-30% 11/06/2014  . Weakness 11/06/2014  . AICD (automatic cardioverter/defibrillator) present   . Torsades de pointes with 3 shocks, another pace terminated 10/31/2014  . Mobitz type 2 second degree atrioventricular block 10/31/2014  . Complete heart block (HCC) 10/31/2014  . CAD (coronary artery disease) 04/13/2012  . Acute systolic CHF (congestive heart failure) (HCC) 04/12/2012  . Hypertension   . High cholesterol   . Type II diabetes mellitus (HCC)     Orientation RESPIRATION  BLADDER Height & Weight     Self  Normal(see DC summary) External catheter Weight: 71.3 kg Height:  5\' 4"  (162.6 cm)  BEHAVIORAL SYMPTOMS/MOOD NEUROLOGICAL BOWEL NUTRITION STATUS      Continent Diet(see DC Summary)  AMBULATORY STATUS COMMUNICATION OF NEEDS Skin   Extensive Assist Verbally Other (Comment)(bilateral hip and sacaral decubiti--unstageable)                       Personal Care Assistance Level of Assistance  Bathing, Feeding, Dressing Bathing Assistance: Maximum assistance Feeding assistance: Limited assistance Dressing Assistance: Maximum assistance     Functional Limitations Info  Sight, Hearing, Speech Sight Info: Adequate Hearing Info: Adequate Speech Info: Adequate    SPECIAL CARE FACTORS FREQUENCY  PT (By licensed PT)     PT Frequency: 5x/week              Contractures Contractures Info: Not present    Additional Factors Info  Code Status, Allergies Code Status Info: DNR Allergies Info: NKA           Current Medications (10/20/2019):  This is the current hospital active medication list Current Facility-Administered Medications  Medication Dose Route Frequency Provider Last Rate Last Admin  . acetaminophen (TYLENOL) tablet 650 mg  650 mg Oral Q6H PRN Emokpae, Ejiroghene E, MD   650 mg at 10/19/19 1110   Or  . acetaminophen (TYLENOL) suppository 650 mg  650 mg Rectal Q6H PRN Emokpae, Ejiroghene E, MD      . aspirin EC tablet 81 mg  81 mg Oral Daily Emokpae, Ejiroghene E, MD   81 mg at 10/20/19 0911  . atorvastatin (LIPITOR) tablet 80 mg  80 mg Oral Daily Emokpae, Ejiroghene E, MD   80 mg at 10/20/19 0912  . ceFEPIme (MAXIPIME) 2 g in sodium chloride 0.9 % 100 mL IVPB  2 g Intravenous Q24H Poindexter, Leann T, RPH 200 mL/hr at 10/20/19 0244 2 g at 10/20/19 0244  . clopidogrel (PLAVIX) tablet 75 mg  75 mg Oral Daily Emokpae, Ejiroghene E, MD   75 mg at 10/20/19 0912  . collagenase (SANTYL) ointment   Topical BID Virl Cagey, MD   Given  at 46/27/03 5009  . folic acid (FOLVITE) tablet 1 mg  1 mg Oral Daily Tat, David, MD   1 mg at 10/20/19 0912  . heparin injection 5,000 Units  5,000 Units Subcutaneous Q8H Emokpae, Ejiroghene E, MD   5,000 Units at 10/20/19 0612  . HYDROcodone-acetaminophen (NORCO/VICODIN) 5-325 MG per tablet 1 tablet  1 tablet Oral Q6H PRN Orson Eva, MD   1 tablet at 10/20/19 0927  . levothyroxine (SYNTHROID) tablet 75 mcg  75 mcg Oral QAC breakfast Tat, Shanon Brow, MD   75 mcg at 10/19/19 0515  . LORazepam (ATIVAN) injection 0.5 mg  0.5 mg Intravenous Q6H PRN Tat, David, MD      . memantine [facility]) tablet 10 mg  10 mg Oral BID Emokpae, Ejiroghene E, MD   10 mg at 10/20/19 0912  . metroNIDAZOLE (FLAGYL) IVPB 500 mg  500 mg Intravenous Q8H Emokpae, Ejiroghene E, MD 100 mL/hr at 10/20/19 0913 500 mg at 10/20/19 0913  . ondansetron (ZOFRAN) injection 4 mg  4 mg Intravenous Q6H PRN Tat, David, MD      . polyethylene glycol (MIRALAX / GLYCOLAX) packet 17 g  17 g Oral Daily PRN Emokpae, Ejiroghene E, MD      . vancomycin (VANCOREADY) IVPB 1250 mg/250 mL  1,250 mg Intravenous Q48H Poindexter, Leann T, RPH 166.7 mL/hr at 10/20/19 0038 1,250 mg at 10/20/19 0038  . vitamin B-12 (CYANOCOBALAMIN) tablet 500 mcg  500 mcg Oral Daily Tat, David, MD   500 mcg at 10/20/19 3818     Discharge Medications: Please see discharge summary for a list of discharge medications.  Relevant Imaging Results:  Relevant Lab Results:   Additional Information SSN # [id number]  Zeinab Rodwell, Chauncey Reading, RN

## 2019-10-20 NOTE — TOC Progression Note (Signed)
Transition of Care [facility]) - Progression Note    Patient Details  Name: [name] MRN: [id number] Date of Birth: Dec 07, 1934  Transition of Care [facility] CM/SW Contact  Koji Niehoff, Chrystine Oiler, RN Phone Number: 10/20/2019, 1:07 PM  Clinical Narrative:   Husband is now interested in SNF placement, if possible prefers [facility] in [place].     Expected Discharge Plan: Home w Home Health Services Barriers to Discharge: Continued Medical Work up  Expected Discharge Plan and Services Expected Discharge Plan: Home w Home Health Services   Discharge Planning Services: CM Consult   Living arrangements for the past 2 months: Single Family Home                           HH Arranged: RN, PT, Social Work [employer] Agency: [facility] Date [facility] Agency Contacted: 10/19/19 Time HH Agency Contacted: 1302 Representative spoke with at [facility] Agency: Faxed   Social Determinants of Health (SDOH) Interventions    Readmission Risk Interventions No flowsheet data found.

## 2019-10-20 NOTE — Progress Notes (Signed)
Responded to nursing call:  Rectal bleed and low BP   Subjective: Pt denies cp, sob, abd pain. Remainder ROS unobtainable due to dementia.  Vitals:   10/20/19 0540 10/20/19 0617 10/20/19 1358 10/20/19 1542  BP: 99/75  (!) 78/51 (!) 80/72  Pulse: 80 77 81   Resp: 16  20   Temp: 97.6 F (36.4 C)  (!) 97.3 F (36.3 C)   TempSrc:   Oral   SpO2: (!) 79% 100% 94%   Weight:      Height:       CV--RRR Lung--bibasilar crackles, no wheeze Abd--soft+BS/NT -rectal exam with ext hemorrhoids and sm/mod amount bright red blood.   Assessment/Plan: Rectal Bleeding/Hematochezia -noted during wound dressing change 3/13 -d/c ASA/Plavix, [place] Heparin -check coags -GI consult -type and screen -check H/H every 6 hours -transfer to stepdown  I personally check vitals--HR82--RR16--100/67--98% RA  -had long goals of care discussion with spouse -confirmed DNR -continue to treat the treatable for now -ok with sdu transfer and above    The patient is critically ill with multiple organ systems failure and requires high complexity decision making for assessment and support, frequent evaluation and titration of therapies, application of advanced monitoring technologies and extensive interpretation of multiple databases.  Critical care time - 40 mins.    Catarina Hartshorn, DO [facility]

## 2019-10-20 NOTE — Progress Notes (Signed)
Discussed with sons and spouse at the bedside about patient's condition.  Explained that the patient's condition is critical and overall prognosis is poor.  I updated sons, spouse and rest of the family and summarized her hospital stay up to this point.  I discussed that the patient's age and her comorbid conditions including but not limited to dementia,sepsis, CAD, ischemic cardiomyopathy with EF 25%, DM2, AKI and now new rectal bleeding with hypotension have put her at high risk for mortality despite full aggressive measures.   I explained to the family with spouse and sons my plan for blood work, blood transfusion, fluid resuscitation and central line placement, but that her prognosis remained poor.  At this time, the patient's son became very upset and irate and shouted several expletives toward me and used multiple racial slurs regarding my ethnicity stating he felt that I was not doing enough for his mother because she was [place].  He subsequently demanded another physician take over the care of his mother.   At the time of my discussion, the patient was on levophed at 10 mcg/kg/min with SBP in upper 80s.  I personally spoke with general surgery, Dr. Henreitta Leber to request central line placement given the patient's critical condition.  She agreed to place central line.  Dr. Henreitta Leber spoke with patient's son to get consent and patient's son refused stating that he did not feel it was safe or indicated.  I further explained to the son and spouse that the patient remains in critical condition and that I would notify the administrator of his request to change attending physicians.  I also explained that no other physician from my staff is immediately available to assume care immediately and that it would be unethical for my to relinquish care at this moment.  I assured the son that I would notify the physician on call tonight about his mother at the end of my shift.  He continued to use expletives and  demand I leave the room and immediately relinquish care.  I subsequently spoke with Dr. Jyl Heinz to transition the patient's care.  DTat

## 2019-10-20 NOTE — Progress Notes (Addendum)
Pt bed approved to ICU room 11. Order for PICC line placed, Vascular Access contacted. Will call back with an ETA.

## 2019-10-20 NOTE — Progress Notes (Signed)
PROGRESS NOTE  [name] ZOX:[id number] DOB: 1935-01-20 DOA: 10/17/19 PCP: [name], MD   Brief History: 85 year old female with a history of Alzheimer's dementia, coronary artery disease, systolic CHF with EF 20 to 25%, torsades VT status post AICD, hypertension, diabetes mellitus type 2, hyperlipidemia presenting with increasing somnolence and poor oral intake. The patient is unable to provide any significant history secondary to her dementia. History is supplemented by the patient's spouse at the bedside. He states that over the past month he has noticed worsening decubitus ulcers for which he has been caring at home. He is the primary caregiver. She has not had fevers, chills, chest pain, shortness of breath, nausea, vomiting, diarrhea. However over the past month he has noticed increasing somnolence and decreased oral intake as well as increasing pain. In addition, the spouse has noted a worsening functional and cognitive decline over the last 4 months. She has been essentially bedbound for at least the last 4 to 5 months. The patient was afebrile hemodynamically stable with oxygen saturation 100%. Lactic acid was 3.1 with WBC 21.7. The patient was started on IV antibiotics.  Assessment/Plan: Sepsis -Present on admission -Secondary to infected decubitus ulcers, bacteremia, possible UTI -Lactic acid 3.1 -Continue vancomycin, cefepime, metronidazole  Bacteremia--B.fragilis -Oct 17, 2019 blood culture--GNR, GPC -there is likely uncultureable/nonviable GNR  Infected decubitus ulcers -10-17-19 CT abdomen/pelvis--right lateral decubitus ulcer with subcutaneous inflammatory changes extending toward the greater trochanter. There is no bony destructive changes. There is considerable inflammatory change with air tracking toward the lateral aspect of the femur. -General surgery consultappreciated -10/17/19--bedside debridement -Continue empiric  antibiotics--followingcultures  Rectal Bleeding/Hematochezia -noted during wound dressing change 3/13 -d/c ASA/Plavix, [place] Heparin -check coags -GI consult -type and screen -check H/H every 6 hours  UTI--Klebsiella PNA -UA >50wbc -already on abx for sepsis/decubiti  Failure to thrive -Serum B12--199-->supplement -TSH--37.364, Free T4--0.28 -Folate--7.2 -PT evaluation-->SNF -continues to have poor po intake  Hypernatremia -Discontinue half-normal saline -continueD5W-->improved -due to continued poor po intake-->restart NS  AKI -pt may have underlying CKD -renal US-->medical renal disease  Chronic systolic CHF -02/2015 echo EF 20-25%, G2DD -Judicious IV fluids  Alzheimer's dementia -Continue Namenda -spouse states pt screams for attention frequently at home  Coronary artery disease -hx of CABG in 2009 (LIMA-LAD, SVG-ramus, SVG-OM). Hx of RCA stent 04/2012 -no chest pain presently -Holding aspirin and Plavix due to rectal bleeding  Ventricular Tachycardia - she has CRT-D device followed by EP - she underwent ICD upgrade of single chamber ICD to [employer] CRT-D  Hyperlipidemia -continue statin  HTN -holding coreg/hydralazine due to soft BPs  Bilateral hip and sacral decubiti--unstageable -present on admission -appreciate surgical eval -s/p bedside debridement 10/17/19      Disposition Plan: Patient From: Home D/C Place:SNF Barriers: Not Clinically Stable--new rectal bleed, poor po intake, low BPs, awaiting final cultures and wound improvement  Family Communication:Spouse updatedat bedside 3/13  Consultants:General surgery; palliative medicine  Code Status: DNR  DVT Prophylaxis: [place] Heparin    Procedures: As Listed in Progress Note Above  Antibiotics: vanc 3/9>> Cefepime 3/9>> Metronidazole 3/9>>   Total time spent 35 minutes. Greater than 50% spent face to face counseling and coordinating  care.     Subjective: Pt awake but confused.  Intermittently answers and follows commands.  Denies cp, sob, abd pain.  Remainder unobtainable due to dementia  Objective: Vitals:   10/20/19 0300 10/20/19 0540 10/20/19 0617 10/20/19 1358  BP: 92/65 99/75  (!) 78/51  Pulse: 72 80 77 81  Resp: 16 16  20   Temp:  97.6 F (36.4 C)  (!) 97.3 F (36.3 C)  TempSrc:    Oral  SpO2: 100% (!) 79% 100% 94%  Weight:      Height:        Intake/Output Summary (Last 24 hours) at 10/20/2019 1526 Last data filed at 10/20/2019 0500 Gross per 24 hour  Intake 1535.54 ml  Output 900 ml  Net 635.54 ml   Weight change:  Exam:   General:  Pt is alert, follows commands appropriately, not in acute distress  HEENT: No icterus, No thrush, No neck mass, [place]/AT  Cardiovascular: RRR, S1/S2, no rubs, no gallops  Respiratory: bibasilar crackles. No wheeze  Abdomen: Soft/+BS, non tender, non distended, no guarding  Extremities: 1+LE edema, No lymphangitis, No petechiae, No rashes, no synovitis   Data Reviewed: I have personally reviewed following labs and imaging studies Basic Metabolic Panel: Recent Labs  Lab 12-13-19 1515 12-13-19 1740 10/17/19 0607 10/18/19 0611 10/19/19 0500 10/20/19 0709  NA 147*  --  148* 142 138 134*  K 4.4  --  4.3 4.1 4.3 4.2  CL 112*  --  116* 112* 111 107  CO2 24  --  20* 20* 15* 17*  GLUCOSE 195*  --  166* 188* 196* 113*  BUN 54*  --  54* 56* 60* 60*  CREATININE 1.83*  --  1.67* 1.81* 1.74* 1.82*  CALCIUM 7.9*  --  7.6* 7.1* 6.7* 6.7*  MG  --  2.4  --  2.3  --   --    Liver Function Tests: Recent Labs  Lab 12-13-19 1515  AST 20  ALT 13  ALKPHOS 81  BILITOT 0.8  PROT 6.6  ALBUMIN 2.7*   No results for input(s): LIPASE, AMYLASE in the last 168 hours. No results for input(s): AMMONIA in the last 168 hours. Coagulation Profile: Recent Labs  Lab 12-13-19 1515  INR 1.1   CBC: Recent Labs  Lab 12-13-19 1515 12-13-19 1515 10/17/19 0607  10/17/19 1455 10/18/19 0611 10/19/19 0500 10/20/19 0709  WBC 21.7*  --  25.6*  --  24.5* 19.5* 22.1*  NEUTROABS 19.7*  --   --   --   --   --   --   HGB 8.6*  --  9.4*  --  8.2* 8.2* 7.9*  HCT 28.9*   < > 32.6* 27.9* 28.6* 29.0* 27.3*  MCV 100.3*  --  104.8*  --  102.5* 103.6* 100.7*  PLT 299  --  260  --  226 197 206   < > = values in this interval not displayed.   Cardiac Enzymes: Recent Labs  Lab 10/19/19 0500  CKTOTAL 122   BNP: Invalid input(s): POCBNP CBG: Recent Labs  Lab 10/19/19 0757 10/19/19 1159 10/19/19 1715 10/19/19 2256 10/20/19 0726  GLUCAP 169* 185* 213* 165* 94   HbA1C: Recent Labs    10/19/19 0500  HGBA1C 5.5   Urine analysis:    Component Value Date/Time   COLORURINE AMBER (A) 10/17/2019 1426   APPEARANCEUR CLOUDY (A) 10/17/2019 1426   LABSPEC 1.021 10/17/2019 1426   PHURINE 5.0 10/17/2019 1426   GLUCOSEU NEGATIVE 10/17/2019 1426   HGBUR SMALL (A) 10/17/2019 1426   BILIRUBINUR NEGATIVE 10/17/2019 1426   KETONESUR 5 (A) 10/17/2019 1426   PROTEINUR 30 (A) 10/17/2019 1426   NITRITE NEGATIVE 10/17/2019 1426   LEUKOCYTESUR MODERATE (A) 10/17/2019 1426   Sepsis Labs: @LABRCNTIP (procalcitonin:4,lacticidven:4) ) Recent Results (  from the past 240 hour(s))  Wound or Superficial Culture     Status: None   Collection Time: 11-09-2019  2:28 PM   Specimen: Wound  Result Value Ref Range Status   Specimen Description   Final    WOUND RIGHT ILIAC Performed at [facility], [address] [place], [place] [address]    Special Requests   Final    Normal Performed at [facility], [address] [place], [place] [address]    Gram Stain   Final    RARE WBC PRESENT, PREDOMINANTLY PMN ABUNDANT GRAM NEGATIVE RODS FEW GRAM POSITIVE COCCI RARE GRAM POSITIVE RODS Performed at [facility], [address] [place], [place] [address]    Culture   Final    ABUNDANT ESCHERICHIA COLI ABUNDANT PROTEUS MIRABILIS    Report Status 10/20/2019 FINAL   Final   Organism ID, Bacteria ESCHERICHIA COLI  Final   Organism ID, Bacteria PROTEUS MIRABILIS  Final      Susceptibility   Escherichia coli - MIC*    AMPICILLIN >=32 RESISTANT Resistant     CEFAZOLIN >=64 RESISTANT Resistant     CEFEPIME <=0.12 SENSITIVE Sensitive     CEFTAZIDIME <=1 SENSITIVE Sensitive     CEFTRIAXONE <=0.25 SENSITIVE Sensitive     CIPROFLOXACIN <=0.25 SENSITIVE Sensitive     GENTAMICIN <=1 SENSITIVE Sensitive     IMIPENEM <=0.25 SENSITIVE Sensitive     TRIMETH/SULFA <=20 SENSITIVE Sensitive     AMPICILLIN/SULBACTAM >=32 RESISTANT Resistant     PIP/TAZO <=4 SENSITIVE Sensitive     * ABUNDANT ESCHERICHIA COLI   Proteus mirabilis - MIC*    AMPICILLIN <=2 SENSITIVE Sensitive     CEFAZOLIN <=4 SENSITIVE Sensitive     CEFEPIME <=0.12 SENSITIVE Sensitive     CEFTAZIDIME <=1 SENSITIVE Sensitive     CEFTRIAXONE <=0.25 SENSITIVE Sensitive     CIPROFLOXACIN <=0.25 SENSITIVE Sensitive     GENTAMICIN <=1 SENSITIVE Sensitive     IMIPENEM 4 SENSITIVE Sensitive     TRIMETH/SULFA <=20 SENSITIVE Sensitive     AMPICILLIN/SULBACTAM <=2 SENSITIVE Sensitive     PIP/TAZO <=4 SENSITIVE Sensitive     * ABUNDANT PROTEUS MIRABILIS  Blood Culture (routine x 2)     Status: Abnormal   Collection Time: 2019-11-09  3:15 PM   Specimen: BLOOD  Result Value Ref Range Status   Specimen Description   Final    BLOOD RIGHT ANTECUBITAL Performed at [facility], [address], [place], [place] [address]    Special Requests   Final    BOTTLES DRAWN AEROBIC AND ANAEROBIC Blood Culture adequate volume Performed at [facility], [address], [place], [place] [address]    Culture  Setup Time   Final    GRAM NEGATIVE RODS Gram Stain Report Called to,Read Back By and Verified With: OLIVER,J ON 10/17/19 AT 1910 BY LOY,C PERFORMED AT [facility] ANAEROBIC BOTTLE GRAM POSITIVE COCCI AEROBIC BOTTLE ONLY Gram Stain Report Called to,Read Back By and Verified With: H EVANS,RN@[id number]  10/18/19 MKELLY     Culture (A)  Final    STAPHYLOCOCCUS EPIDERMIDIS THE SIGNIFICANCE OF ISOLATING THIS ORGANISM FROM A SINGLE SET OF BLOOD CULTURES WHEN MULTIPLE SETS ARE DRAWN IS UNCERTAIN. PLEASE NOTIFY THE MICROBIOLOGY DEPARTMENT WITHIN ONE WEEK IF SPECIATION AND SENSITIVITIES ARE REQUIRED. BACTEROIDES FRAGILIS BETA LACTAMASE POSITIVE Performed at [facility], [address] [place], [place] [address]    Report Status 10/19/2019 FINAL  Final  Blood Culture ID Panel (Reflexed)  Status: None   Collection Time: 10/13/2019  3:15 PM  Result Value Ref Range Status   Enterococcus species NOT DETECTED NOT DETECTED Final   Listeria monocytogenes NOT DETECTED NOT DETECTED Final   Staphylococcus species NOT DETECTED NOT DETECTED Final   Staphylococcus aureus (BCID) NOT DETECTED NOT DETECTED Final   Streptococcus species NOT DETECTED NOT DETECTED Final   Streptococcus agalactiae NOT DETECTED NOT DETECTED Final   Streptococcus pneumoniae NOT DETECTED NOT DETECTED Final   Streptococcus pyogenes NOT DETECTED NOT DETECTED Final   Acinetobacter baumannii NOT DETECTED NOT DETECTED Final   Enterobacteriaceae species NOT DETECTED NOT DETECTED Final   Enterobacter cloacae complex NOT DETECTED NOT DETECTED Final   Escherichia coli NOT DETECTED NOT DETECTED Final   Klebsiella oxytoca NOT DETECTED NOT DETECTED Final   Klebsiella pneumoniae NOT DETECTED NOT DETECTED Final   Proteus species NOT DETECTED NOT DETECTED Final   Serratia marcescens NOT DETECTED NOT DETECTED Final   Haemophilus influenzae NOT DETECTED NOT DETECTED Final   Neisseria meningitidis NOT DETECTED NOT DETECTED Final   Pseudomonas aeruginosa NOT DETECTED NOT DETECTED Final   Candida albicans NOT DETECTED NOT DETECTED Final   Candida glabrata NOT DETECTED NOT DETECTED Final   Candida krusei NOT DETECTED NOT DETECTED Final   Candida parapsilosis NOT DETECTED NOT DETECTED Final   Candida tropicalis NOT DETECTED NOT DETECTED Final    Comment:  Performed at [facility], [address] [place], [place] [address]  Respiratory Panel by RT PCR (Flu A&B, Covid) - Nasopharyngeal Swab     Status: None   Collection Time: 10/08/2019  5:00 PM   Specimen: Nasopharyngeal Swab  Result Value Ref Range Status   SARS Coronavirus 2 by RT PCR NEGATIVE NEGATIVE Final    Comment: (NOTE) SARS-CoV-2 target nucleic acids are NOT DETECTED. The SARS-CoV-2 RNA is generally detectable in upper respiratoy specimens during the acute phase of infection. The lowest concentration of SARS-CoV-2 viral copies this assay can detect is 131 copies/mL. A negative result does not preclude SARS-Cov-2 infection and should not be used as the sole basis for treatment or other patient management decisions. A negative result may occur with  improper specimen collection/handling, submission of specimen other than nasopharyngeal swab, presence of viral mutation(s) within the areas targeted by this assay, and inadequate number of viral copies (<131 copies/mL). A negative result must be combined with clinical observations, patient history, and epidemiological information. The expected result is Negative. Fact Sheet for Patients:  https://www.moore.com/ Fact Sheet for Healthcare Providers:  https://www.young.biz/ This test is not yet ap proved or cleared by the [place] FDA and  has been authorized for detection and/or diagnosis of SARS-CoV-2 by FDA under an Emergency Use Authorization (EUA). This EUA will remain  in effect (meaning this test can be used) for the duration of the COVID-19 declaration under Section 564(b)(1) of the Act, 21 U.S.C. section 360bbb-3(b)(1), unless the authorization is terminated or revoked sooner.    Influenza A by PCR NEGATIVE NEGATIVE Final   Influenza B by PCR NEGATIVE NEGATIVE Final    Comment: (NOTE) The Xpert Xpress SARS-CoV-2/FLU/RSV assay is intended as an aid in  the diagnosis of  influenza from Nasopharyngeal swab specimens and  should not be used as a sole basis for treatment. Nasal washings and  aspirates are unacceptable for Xpert Xpress SARS-CoV-2/FLU/RSV  testing. Fact Sheet for Patients: https://www.moore.com/ Fact Sheet for Healthcare Providers: https://www.young.biz/ This test is not yet approved or cleared by the [place] and  has been authorized for detection and/or diagnosis of SARS-CoV-2 by  FDA under an Emergency Use Authorization (EUA). This EUA will remain  in effect (meaning this test can be used) for the duration of the  Covid-19 declaration under Section 564(b)(1) of the Act, 21  U.S.C. section 360bbb-3(b)(1), unless the authorization is  terminated or revoked. Performed at [facility], [address], [place], [place] [address]   Blood Culture (routine x 2)     Status: None (Preliminary result)   Collection Time: 2019/10/23  5:13 PM   Specimen: Left Antecubital; Blood  Result Value Ref Range Status   Specimen Description LEFT ANTECUBITAL  Final   Special Requests   Final    BOTTLES DRAWN AEROBIC ONLY Blood Culture results may not be optimal due to an inadequate volume of blood received in culture bottles   Culture   Final    NO GROWTH 4 DAYS Performed at [facility], [address], [place], [place] [address]    Report Status PENDING  Incomplete  Urine culture     Status: Abnormal   Collection Time: 10/17/19  2:26 PM   Specimen: In/Out Cath Urine  Result Value Ref Range Status   Specimen Description   Final    IN/OUT CATH URINE Performed at [facility], [address] [place], [place] [address]    Special Requests   Final    NONE Performed at [facility], [address], [place], [place] [address]    Culture 20,000 COLONIES/mL KLEBSIELLA PNEUMONIAE (A)  Final   Report Status 10/20/2019 FINAL  Final   Organism ID, Bacteria KLEBSIELLA PNEUMONIAE (A)  Final      Susceptibility    Klebsiella pneumoniae - MIC*    AMPICILLIN >=32 RESISTANT Resistant     CEFAZOLIN <=4 SENSITIVE Sensitive     CEFTRIAXONE <=0.25 SENSITIVE Sensitive     CIPROFLOXACIN <=0.25 SENSITIVE Sensitive     GENTAMICIN <=1 SENSITIVE Sensitive     IMIPENEM 1 SENSITIVE Sensitive     NITROFURANTOIN 64 INTERMEDIATE Intermediate     TRIMETH/SULFA <=20 SENSITIVE Sensitive     AMPICILLIN/SULBACTAM 8 SENSITIVE Sensitive     PIP/TAZO <=4 SENSITIVE Sensitive     * 20,000 COLONIES/mL KLEBSIELLA PNEUMONIAE  Aerobic/Anaerobic Culture (surgical/deep wound)     Status: None (Preliminary result)   Collection Time: 10/17/19  6:14 PM   Specimen: Abscess  Result Value Ref Range Status   Specimen Description   Final    ABSCESS Performed at [facility], [address], [place], [place] [address]    Special Requests   Final    RIGHT Performed at [facility], [address], [place], [place] [address]    Gram Stain NO WBC SEEN NO ORGANISMS SEEN   Final   Culture   Final    FEW ESCHERICHIA COLI FEW PROTEUS MIRABILIS HOLDING FOR POSSIBLE ANAEROBE Performed at [facility], [address], [place], [place] [address]    Report Status PENDING  Incomplete  Aerobic/Anaerobic Culture (surgical/deep wound)     Status: None (Preliminary result)   Collection Time: 10/17/19  6:14 PM   Specimen: Abscess  Result Value Ref Range Status   Specimen Description   Final    ABSCESS Performed at [facility], [address] [place], [place] [address]    Special Requests   Final    LEFT Performed at [facility], [address] [place], [place] [address]    Gram Stain   Final    FEW  WBC PRESENT,BOTH PMN AND MONONUCLEAR RARE GRAM VARIABLE ROD RARE GRAM POSITIVE COCCI RARE YEAST    Culture   Final    HOLDING FOR POSSIBLE ANAEROBE Performed at [facility], [address] [place], [place] [address]    Report Status PENDING  Incomplete     Scheduled Meds: . atorvastatin  80 mg Oral Daily  .  collagenase   Topical BID  . folic acid  1 mg Oral Daily  . levothyroxine  75 mcg Oral QAC breakfast  . memantine  10 mg Oral BID  . vitamin B-12  500 mcg Oral Daily   Continuous Infusions: . ceFEPime (MAXIPIME) IV 2 g (10/20/19 0244)  . metronidazole 500 mg (10/20/19 0913)  . vancomycin 1,250 mg (10/20/19 0038)    Procedures/Studies: CT ABDOMEN PELVIS WO CONTRAST  Result Date: 10/11/2019 CLINICAL DATA:  Decubitus ulcers EXAM: CT ABDOMEN AND PELVIS WITHOUT CONTRAST TECHNIQUE: Multidetector CT imaging of the abdomen and pelvis was performed following the standard protocol without IV contrast. COMPARISON:  None. FINDINGS: Lower chest: No acute abnormality. Hepatobiliary: Scattered calcifications are noted within the liver. Gallbladder is well distended with multiple dependent gallstones. Pancreas: Unremarkable. No pancreatic ductal dilatation or surrounding inflammatory changes. Spleen: Normal in size without focal abnormality. Adrenals/Urinary Tract: Adrenal glands are within normal limits. Kidneys are well visualized bilaterally with renal vascular calcifications. No renal stones are seen. No obstructive changes are noted. The bladder is partially distended. Stomach/Bowel: The appendix is not well visualized although no inflammatory changes to suggest appendicitis are noted. Retained fecal material is noted throughout the colon consistent with a mild degree of constipation without obstructive change. No small bowel abnormality is seen. Stomach is unremarkable. Vascular/Lymphatic: Aortic atherosclerosis. No enlarged abdominal or pelvic lymph nodes. Reproductive: Uterus and bilateral adnexa are unremarkable. Other: No abdominal wall hernia or abnormality. No abdominopelvic ascites. Musculoskeletal: Mild changes of anasarca are noted. Decubitus ulcer is noted laterally near the right hip with inflammatory changes extending from the skin towards the greater trochanter. This area measures approximately 7.6  x 4.3 cm. No definitive bony erosive changes to suggest osteomyelitis are noted at this time. No other definitive decubitus ulcer is seen. Degenerative changes of lumbar spine are seen. IMPRESSION: Right lateral decubitus ulcer with subcutaneous inflammatory changes extending towards the greater trochanter of the right proximal femur. No definitive bony destructive changes are seen. Considerable subcutaneous inflammatory changes noted with air tracking towards the lateral aspect of the femur. Changes of mild constipation. Cholelithiasis without complicating factors. Electronically Signed   By: Alcide Clever M.D.   On: 10/10/2019 16:51   US RENAL  Result Date: 10/18/2019 CLINICAL DATA:  Acute renal insufficiency. EXAM: RENAL / URINARY TRACT ULTRASOUND COMPLETE COMPARISON:  10/11/2019 CT FINDINGS: Right Kidney: Renal measurements: 9.3 x 4.4 x 5.8 cm = volume: 123 mL. Increased renal echogenicity. No hydronephrosis. Left Kidney: Renal measurements: 9.6 x 5.2 x 4.6 cm = volume: 119 mL. Increased renal echogenicity. No hydronephrosis. Bladder: Decompressed. Other: Trace perinephric edema/fluid likely relates to renal insufficiency. Note is made of gallbladder sludge and stones including at 1.0 cm. IMPRESSION: 1. No hydronephrosis. Increased renal echogenicity, consistent with medical renal disease. 2. Cholelithiasis without acute cholecystitis. Electronically Signed   By: Jeronimo Greaves M.D.   On: 10/18/2019 11:51   DG Chest Port 1 View  Result Date: 10/18/2019 CLINICAL DATA:  Sepsis EXAM: PORTABLE CHEST 1 VIEW COMPARISON:  11/05/2014 FINDINGS: Stable positioning of a left-sided implanted cardiac device. Post CABG changes. Stable cardiomediastinal  contours. No focal airspace consolidation, pleural effusion, or pneumothorax. IMPRESSION: No acute cardiopulmonary findings. Electronically Signed   By: Davina Poke D.O.   On: 10/26/2019 14:47   [place] EKG SITE RITE  Result Date: 10/20/2019 If Site Rite image not  attached, placement could not be confirmed due to current cardiac rhythm.   Orson Eva, DO  [facility]  If 7PM-7AM, please contact night-coverage [email or web address] Password [email or web address] 10/20/2019, 3:26 PM   LOS: 4 days

## 2019-10-20 NOTE — Care Management (Addendum)
   RE:  [name]       Date of Birth:  06-Jul-1935   Date:  10/20/19      To Whom It May Concern:  Please be advised that the above-named patient will require a short-term nursing home stay - anticipated 30 days or less for rehabilitation and strengthening.  The plan is for return home.

## 2019-10-20 NOTE — Progress Notes (Addendum)
Responded to nursing call:  hypotension   Subjective: Pt is somnolent but awakens.  Confused.  Vitals:   10/20/19 1700 10/20/19 1715 10/20/19 1730 10/20/19 1745  BP: (!) 168/97 (!) 90/50 (!) 67/38 (!) 80/44  Pulse: 95 80 72 74  Resp: (!) 29 13 16  (!) 21  Temp:      TempSrc:      SpO2: 100% 100% 99% 100%  Weight:      Height:       CV--RRR Lung--bibasilar rales Abd--soft+BS/NT   Assessment/Plan: Hypotension -multifactorial--ischemic CM, blood loss, sepsis -1L NS bolus -start levophed -ordered 2 units PRBCs -updated spouse and son at bedside -had another goals of care discussion with family     , DO [facility]

## 2019-10-20 NOTE — Progress Notes (Signed)
[facility]  Talked to son about CVL. Explained it is standard of care with pressor support. Explained that small veins do not tolerate pressors for extended periods. Explained risk and benefits.   He does not wish to pursue this at this time. Patient is DNR. Explained that if he wants to make her comfort care that he would just need to let Dr. Arbutus Leas know. He feels like she is going to do and "what's the poi nt?"  Expressed sympathy and made sure he didn't have any other questions.  No central line at this time.  Algis Greenhouse, Md

## 2019-10-20 NOTE — Care Management (Signed)
RE: [name]  Date of birth: Aug 15, 1934 Date: 10/20/19  TO WHOM IT MAY CONCERN:  Please be advised that the above-named patient has a primary diagnosis of dementia which supersedes any psychiatric diagnosis.

## 2019-10-21 DIAGNOSIS — A419 Sepsis, unspecified organism: Secondary | ICD-10-CM | POA: Diagnosis present

## 2019-10-21 DIAGNOSIS — R6521 Severe sepsis with septic shock: Secondary | ICD-10-CM | POA: Diagnosis present

## 2019-10-21 DIAGNOSIS — D62 Acute posthemorrhagic anemia: Secondary | ICD-10-CM | POA: Diagnosis not present

## 2019-10-21 LAB — BASIC METABOLIC PANEL
Anion gap: 8 (ref 5–15)
BUN: 57 mg/dL — ABNORMAL HIGH (ref 8–23)
CO2: 17 mmol/L — ABNORMAL LOW (ref 22–32)
Calcium: 6.7 mg/dL — ABNORMAL LOW (ref 8.9–10.3)
Chloride: 112 mmol/L — ABNORMAL HIGH (ref 98–111)
Creatinine, Ser: 1.72 mg/dL — ABNORMAL HIGH (ref 0.44–1.00)
GFR calc Af Amer: 32 mL/min — ABNORMAL LOW (ref >60)
GFR calc non Af Amer: 27 mL/min — ABNORMAL LOW (ref >60)
Glucose, Bld: 125 mg/dL — ABNORMAL HIGH (ref 70–99)
Potassium: 4.1 mmol/L (ref 3.5–5.1)
Sodium: 137 mmol/L (ref 135–145)

## 2019-10-21 LAB — CULTURE, BLOOD (ROUTINE X 2): Culture: NO GROWTH

## 2019-10-21 LAB — CBC
HCT: 22.6 % — ABNORMAL LOW (ref 36.0–46.0)
Hemoglobin: 6.9 g/dL — CL (ref 12.0–15.0)
MCH: 29.6 pg (ref 26.0–34.0)
MCHC: 30.5 g/dL (ref 30.0–36.0)
MCV: 97 fL (ref 80.0–100.0)
Platelets: 188 10*3/uL (ref 150–400)
RBC: 2.33 MIL/uL — ABNORMAL LOW (ref 3.87–5.11)
RDW: 15.6 % — ABNORMAL HIGH (ref 11.5–15.5)
WBC: 19.8 10*3/uL — ABNORMAL HIGH (ref 4.0–10.5)
nRBC: 0 % (ref 0.0–0.2)

## 2019-10-21 LAB — GLUCOSE, CAPILLARY
Glucose-Capillary: 124 mg/dL — ABNORMAL HIGH (ref 70–99)
Glucose-Capillary: 106 mg/dL — ABNORMAL HIGH (ref 70–99)

## 2019-10-21 LAB — C-REACTIVE PROTEIN: CRP: 14.1 mg/dL — ABNORMAL HIGH (ref <1.0)

## 2019-10-21 LAB — MAGNESIUM: Magnesium: 1.9 mg/dL (ref 1.7–2.4)

## 2019-10-21 LAB — PROCALCITONIN: Procalcitonin: 2.91 ng/mL

## 2019-10-21 LAB — SEDIMENTATION RATE: Sed Rate: 60 mm/hr — ABNORMAL HIGH (ref 0–22)

## 2019-10-21 LAB — ABO/RH: ABO/RH(D): B POS

## 2019-10-21 LAB — PREPARE RBC (CROSSMATCH)

## 2019-10-21 MED ORDER — SODIUM CHLORIDE 0.9% FLUSH: 10.0000 mL | INTRAVENOUS | Status: DC | PRN

## 2019-10-21 MED ORDER — ORAL CARE MOUTH RINSE
15.0000 mL | Freq: Two times a day (BID) | OROMUCOSAL | Status: DC
  Administered 2019-10-21 – 2019-11-17 (×45): 15 mL via OROMUCOSAL

## 2019-10-21 MED ORDER — SODIUM CHLORIDE 0.9% IV SOLUTION: Freq: Once | INTRAVENOUS | Status: AC

## 2019-10-21 MED ORDER — FENTANYL CITRATE (PF) 100 MCG/2ML IJ SOLN
25.0000 ug | INTRAMUSCULAR | Status: DC | PRN
  Administered 2019-10-26 – 2019-11-15 (×4): 25 ug via INTRAVENOUS
  Filled 2019-10-21 (×4): qty 2

## 2019-10-21 MED ORDER — FUROSEMIDE 10 MG/ML IJ SOLN: 40.0000 mg | Freq: Once | INTRAMUSCULAR | Status: DC

## 2019-10-21 MED ORDER — DEXTROSE-NACL 5-0.45 % IV SOLN: INTRAVENOUS | Status: DC

## 2019-10-21 MED ORDER — SODIUM CHLORIDE 0.9% FLUSH
3.0000 mL | INTRAVENOUS | Status: DC | PRN
  Administered 2019-10-22 – 2019-10-23 (×3): 3 mL via INTRAVENOUS

## 2019-10-21 MED ORDER — ENSURE ENLIVE PO LIQD
237.0000 mL | Freq: Three times a day (TID) | ORAL | Status: DC
  Administered 2019-10-22 – 2019-11-04 (×5): 237 mL via ORAL

## 2019-10-21 MED ORDER — NOREPINEPHRINE 16 MG/250ML-% IV SOLN
0.0000 ug/min | INTRAVENOUS | Status: DC
  Administered 2019-10-21: 15 ug/min via INTRAVENOUS
  Filled 2019-10-21: qty 250

## 2019-10-21 MED ORDER — SODIUM CHLORIDE 0.9% IV SOLUTION: Freq: Once | INTRAVENOUS | Status: DC

## 2019-10-21 MED ORDER — SODIUM CHLORIDE 0.9% FLUSH
3.0000 mL | Freq: Two times a day (BID) | INTRAVENOUS | Status: DC
  Administered 2019-10-21 – 2019-10-22 (×4): 3 mL via INTRAVENOUS
  Administered 2019-10-23: 10 mL via INTRAVENOUS
  Administered 2019-10-23 – 2019-10-29 (×5): 3 mL via INTRAVENOUS

## 2019-10-21 MED ORDER — FUROSEMIDE 10 MG/ML IJ SOLN
20.0000 mg | Freq: Once | INTRAMUSCULAR | Status: AC
  Administered 2019-10-22: 20 mg via INTRAVENOUS
  Filled 2019-10-21: qty 2

## 2019-10-21 MED ORDER — PANTOPRAZOLE SODIUM 40 MG IV SOLR
40.0000 mg | Freq: Two times a day (BID) | INTRAVENOUS | Status: DC
  Administered 2019-10-21 – 2019-10-26 (×10): 40 mg via INTRAVENOUS
  Filled 2019-10-21 (×10): qty 40

## 2019-10-21 MED ORDER — SODIUM CHLORIDE 0.9% FLUSH
10.0000 mL | Freq: Two times a day (BID) | INTRAVENOUS | Status: DC
  Administered 2019-10-21 – 2019-10-28 (×14): 10 mL
  Administered 2019-10-29 – 2019-10-30 (×2): 20 mL
  Administered 2019-11-01 – 2019-11-03 (×5): 10 mL
  Administered 2019-11-03: 20 mL
  Administered 2019-11-04 – 2019-11-08 (×7): 10 mL
  Administered 2019-11-09: 20 mL
  Administered 2019-11-09 – 2019-11-10 (×2): 10 mL
  Administered 2019-11-10: 20 mL
  Administered 2019-11-11: 10 mL
  Administered 2019-11-11: 20 mL
  Administered 2019-11-12 – 2019-11-14 (×6): 10 mL
  Administered 2019-11-15: 20 mL
  Administered 2019-11-15 – 2019-11-16 (×2): 10 mL

## 2019-10-21 NOTE — Progress Notes (Signed)
Dr. Welton Flakes notified of family request to review care with him.  Dr. Welton Flakes was in and discussed, condition, progress and plan of care at length with the family at length and allowed time for questions.

## 2019-10-21 NOTE — Progress Notes (Signed)
Son [name]) and spouse have agreed to have the PICC line placed as soon as the service is available.

## 2019-10-21 NOTE — Progress Notes (Signed)
Dr. Welton Flakes notified patient has not voided on this shift.  Bladder scan shows .

## 2019-10-21 NOTE — Progress Notes (Signed)
Peripherally Inserted Central Catheter/Midline Placement  The IV Nurse has discussed with the patient and/or persons authorized to consent for the patient, the purpose of this procedure and the potential benefits and risks involved with this procedure.  The benefits include less needle sticks, lab draws from the catheter, and the patient may be discharged home with the catheter. Risks include, but not limited to, infection, bleeding, blood clot (thrombus formation), and puncture of an artery; nerve damage and irregular heartbeat and possibility to perform a PICC exchange if needed/ordered by physician.  Alternatives to this procedure were also discussed.  Bard Power PICC patient education guide, fact sheet on infection prevention and patient information card has been provided to patient /or left at bedside.  Family signed consent   PICC/Midline Placement Documentation  PICC Double Lumen 10/21/19 PICC Right Brachial 36 cm 0 cm (Active)  Indication for Insertion or Continuance of Line Vasoactive infusions 10/21/19 1400  Exposed Catheter (cm) 0 cm 10/21/19 1400  Site Assessment Clean;Dry;Intact 10/21/19 1400  Lumen #1 Status Flushed;Saline locked;Blood return noted 10/21/19 1400  Lumen #2 Status Flushed;Saline locked;Blood return noted 10/21/19 1400  Dressing Type Transparent 10/21/19 1400  Dressing Status Clean;Dry;Intact;Antimicrobial disc in place 10/21/19 1400  Dressing Change Due 10/28/19 10/21/19 1400       [name] 10/21/2019, 2:01 PM

## 2019-10-21 NOTE — Progress Notes (Signed)
Patient Demographics:    [name], is a 85 y.o. female, DOB - Jan 11, 1935, NTI:[id number]  Admit date - 10/31/2019   Admitting Physician Bethena Roys, MD  Outpatient Primary MD for the patient is Kelly Blitz, MD  LOS - 5   Chief Complaint  Patient presents with   Skin Ulcer        Subjective:    [name] today has no fevers, no emesis,  No chest pain,   --Patient was actually hypothermic requiring a warming blanket -Husband at bedside, RN Purcell Nails and Dumas at bedside -Plan of care and current status reviewed with husband in details at bedside -Husband was able to inspect patient's decubitus ulcers and ask questions at bedside  Assessment  & Plan :    Principal Problem:   Severe sepsis with septic shock - Klebsiella/Proteus/E. coli/Bacteroides and Staph  Active Problems:   S/P ICD (internal cardiac defibrillator) procedure, with upgrade to ICD/CRT-D  [employer]   Cardiomyopathy, ischemic EF 20 to 25 %/history of combined systolic and diastolic dysfunction CHF   Decubitus ulcer of right hip, stage 4 /Infected with E-coli and Proteus   Failure to thrive in adult   Decubitus ulcer of left hip, stage 3/Infected with E-coli and Proteus   Goals of care, counseling/discussion   Hematochezia   Sepsis secondary to Klebsiella UTI ([place])   Anemia due to acute blood loss/rectal bleeding   Type II diabetes mellitus ([place])   AKI (acute kidney injury) ([place])   Hypertension   Mobitz type 2 second degree atrioventricular block   Polymorphic ventricular tachycardia (HCC)   Sepsis ([place])   Stage I pressure ulcer of sacral region   Bacteremia due to Gram-negative bacteria   Gram negative sepsis ([place])   Palliative care by specialist   Encounter for hospice care discussion  Brief summary 85 year old female with a history of advanced Alzheimer's dementia, CAD, H/o combined systolic and diastolic  dysfunction CHF/HFrEF/ischemic cardiomyopathy with EF 20 to 25%, h/o torsades VT status post AICD, H/o HTN, DM2, HLD, admitted on 10/18/2019 from home with metabolic encephalopathy in the setting of presumed infection (leukocytosis and Lactic acid was 3.1) with multiple decubitus ulcers   Assessment/Plan: 1)Severe sepsis with septic shock-- POA -Etiology is multifactorial:-Patient has Klebsiella UTI (urine cx 10/17/19), E. coli and Proteus wound decubitus ulcer/wound infection (wound cx from 11/03/2019 and 10/17/19) and staph epi and Bacteroides bacteremia from blood culture dated 10/19/2019 -Patient had hypothermia and required warming blanket until 10/21/2019 --Currently on vancomycin, cefepime, metronidazole--- MRSA PCR negative on 10/20/2019 -Okay to discontinue IV vancomycin on 10/21/2019 -Repeat blood culture requested -Patient continues to require Levophed for pressure support currently down to 15 mics from 22 -Patient remains encephalopathic, at baseline she does have advanced dementia however with cognitive and memory deficits -WBC is down to 19.8 from a peak of 25.6 -PCT 2.91, ESR 60, CRP 14.1 -Patient remains critically ill, overall prognosis remains poor  2)Bacteremia--B.fragilis and staph epi--- ???  Significance of blood culture from 10/09/2019 -??  Contaminant, however patient is quite septic --Sepsis pathophysiology and Antibiotics as above #1 -Repeat blood cultures on 10/21/2019  3)Infected Bilateral Hip and Sacral decubitus ulcers--please see photos in epic -10/09/2019 CT abdomen/pelvis--right lateral decubitus ulcer with subcutaneous inflammatory changes extending toward  the greater trochanter. There is no bony destructive changes. There is considerable inflammatory change with air tracking toward the lateral aspect of the femur. -General surgery consultappreciated -10/17/19--bedside debridement -Wound cultures from 10/21/2019 and 10/17/2019 with E. coli and Proteus -Sepsis  pathophysiology and Antibiotics as above #1  4) Klebsiella UTI--urine culture from 10/17/2019 noted --Sepsis pathophysiology and Antibiotics as above #1  5)Failure to thrive -Serum B12--199-->supplement -TSH--37.364, Free T4--0.28 -Folate--7.2 -PT evaluation-->SNF -continues to have poor po intake -Cognitive status and encephalopathy makes it difficult to feed orally -Severe sepsis with septic shock precludes PEG tube placement/endoscopy -Continue judicious IV dextrose fluids given that patient's EF is down to 20 to 25% -Ensure supplements as ordered if patient awake enough to tolerate  6) symptomatic acute on chronic blood loss anemia due to rectal bleeding-hemoglobin down to 6.9 from 9.4 on 10/17/2019---patient is hypotensive and hypothermic, -No further rectal bleeding -Protonix for GI prophylaxis -Transfuse 2 units of packed cells --Severe sepsis with septic shock and hemodynamic instability precludes endoluminal evaluation  7)CKD stage 3b -initially felt to be AKI but now more likely progression of CKD -pt likely has progression of underlying CKD -Creatinine hovering below 2 -No recent renal labs available to compare current values to -renal US-->medical renal disease - renally adjust medications, avoid nephrotoxic agents / dehydration  / hypotension  8)H/o combined systolic and diastolic dysfunction CHF/HFrEF/ischemic cardiomyopathy with EF 20 to 25%- -02/2015 echo EF 20-25%, G2DD -Currently with persistent hypotension -Judicious IV fluids  9)Hypernatremia/dehydration -Improved with IV fluids, -due to continued poor po intake--D5 half-normal as ordered  10)Alzheimer's dementia/ambulatory dysfunction -Continue Namenda -spouse states pt screamsfrequently at home -At baseline patient has very advanced dementia with severe cognitive and memory deficits, she has been bed-bound for the last 3 months at least, requiring total care  11)Coronary artery disease -hx of CABG  in 2009 (LIMA-LAD, SVG-ramus, SVG-OM). Hx of RCA stent 04/2012 -no chest pain presently Discontinued Aspirin and Plavix due to rectal bleeding with symptomatic Anemia -Continue statin -Coreg on hold due to hypotension  12)Social/Ethics--- overall prognosis is poor, patient is a DNR/DNI -Plan of care, goals of care discussed with patient's husband at bedside, I also called and discussed case with patient's son [name] at [phone]--- questions answered -Official palliative care consult pending  13)Ventricular Tachycardia/H/o Torsades -status post AICD placement - she has CRT-D device followed by EP - she underwent ICD upgrade of single chamber ICD to [employer] CRT-D  CRITICAL CARE Performed by: Roxan Hockey   Total critical care time: 46 minutes  Critical care time was exclusive of separately billable procedures and treating other patients.  Critical care was necessary to treat or prevent imminent or life-threatening deterioration.  -Patient with hypothermia requiring warming blanket, patient with persistent hypotension requiring IV Levophed/pressure support -Overall patient with severe sepsis with septic shock -Remains critically ill in ICU  Critical care was time spent personally by me on the following activities: development of treatment plan with patient and/or surrogate as well as nursing, discussions with consultants, evaluation of patient's response to treatment, examination of patient, obtaining history from patient or surrogate, ordering and performing treatments and interventions, ordering and review of laboratory studies, ordering and review of radiographic studies, pulse oximetry and re-evaluation of patient's condition.    Disposition Plan: Patient From: Home D/C Place:SNF when medically stable Barriers: Not Clinically Stable--poor po intake, low BPs requiring pressors due to septic shock, requiring IV antibiotics and IV fluids  -Requiring transfusion of PRBC  due to rectal bleeding with  symptomatic anemia  Code Status : DNR  Procedures-- bedside decubitus ulcer debridement on 10/17/2019 -Right arm PICC line placement on 10/21/2019  Family Communication:   Spouse updatedat bedside 3/14,  - I also called and discussed case with patient's son [name] at [phone]--- questions answered  Consults  : Palliative care and general surgery  DVT Prophylaxis  :  Teds and SCDs/rectal bleeding  Lab Results  Component Value Date   PLT 188 10/21/2019   Inpatient Medications  Scheduled Meds:  sodium chloride   Intravenous Once   sodium chloride   Intravenous Once   sodium chloride   Intravenous Once   sodium chloride   Intravenous Once   atorvastatin  80 mg Oral Daily   Chlorhexidine Gluconate Cloth  6 each Topical Daily   collagenase   Topical BID   folic acid  1 mg Oral Daily   furosemide  20 mg Intravenous Once   levothyroxine  37.5 mcg Intravenous Q0600   mouth rinse  15 mL Mouth Rinse BID   memantine  10 mg Oral BID   pantoprazole (PROTONIX) IV  40 mg Intravenous Q12H   sodium chloride flush  10-40 mL Intracatheter Q12H   sodium chloride flush  3 mL Intravenous Q12H   vitamin B-12  500 mcg Oral Daily   Continuous Infusions:  sodium chloride Stopped (10/21/19 0134)   ceFEPime (MAXIPIME) IV 2 g (10/20/19 2307)   metronidazole 500 mg (10/21/19 1630)   norepinephrine (LEVOPHED) Adult infusion 15 mcg/min (10/21/19 1630)   vancomycin 1,250 mg (10/20/19 0038)   PRN Meds:.sodium chloride, acetaminophen **OR** acetaminophen, fentaNYL (SUBLIMAZE) injection, HYDROcodone-acetaminophen, LORazepam, ondansetron (ZOFRAN) IV, polyethylene glycol, sodium chloride flush, sodium chloride flush    Anti-infectives (From admission, onward)   Start     Dose/Rate Route Frequency Ordered Stop   10/17/19 2359  vancomycin (VANCOREADY) IVPB 1250 mg/250 mL     1,250 mg 166.7 mL/hr over 90 Minutes Intravenous Every 48 hours 10/13/2019 2022  10/23/19 2359   10/18/2019 2359  metroNIDAZOLE (FLAGYL) IVPB 500 mg     500 mg 100 mL/hr over 60 Minutes Intravenous Every 8 hours 11/04/2019 2047     10/23/2019 2359  ceFEPIme (MAXIPIME) 2 g in sodium chloride 0.9 % 100 mL IVPB     2 g 200 mL/hr over 30 Minutes Intravenous Every 24 hours 10/24/2019 2012     10/18/2019 1430  vancomycin (VANCOCIN) IVPB 1000 mg/200 mL premix     1,000 mg 200 mL/hr over 60 Minutes Intravenous  Once 11/02/2019 1428 10/31/2019 1713   11/05/2019 1430  piperacillin-tazobactam (ZOSYN) IVPB 3.375 g     3.375 g 100 mL/hr over 30 Minutes Intravenous  Once 10/13/2019 1428 10/13/2019 1607        Objective:   Vitals:   10/21/19 1500 10/21/19 1600 10/21/19 1700 10/21/19 1800  BP: 116/75 (!) 130/55 (!) 114/101 113/71  Pulse: 81 78 64 72  Resp: 16 (!) '24 14 12  ' Temp:      TempSrc:      SpO2: 100% 100% 100% 100%  Weight:      Height:        Wt Readings from Last 3 Encounters:  10/21/19 80.1 kg  06/13/19 78.5 kg  10/19/18 73.6 kg     Intake/Output Summary (Last 24 hours) at 10/21/2019 1837 Last data filed at 10/21/2019 1800 Gross per 24 hour  Intake 3317.01 ml  Output 300 ml  Net 3017.01 ml     Physical Exam  Gen:-Lethargic and incoherent  at times  HEENT:- [place].AT, No sclera icterus Neck-Supple Neck,No JVD,.  Lungs-diminished in bases, no wheezing CV- S1, S2 normal, regular, CABG scar, AICD in situ Abd-  +ve B.Sounds, Abd Soft, No tenderness,    Extremity-Some upper extremity edema, pedal pulses present  Psych-severe cognitive and memory deficits  neuro-generalized weakness, no new focal deficits, no tremors MSK-right arm PICC line  Skin--- decubitus ulcer, please see photos below -Media Information   Document Information  Photos    10/21/2019 09:22  Attached To:  Hospital Encounter on 10/08/2019  Source Information  Roxan Hockey, MD   [facility]   Media Information   Document Information  Photos    10/21/2019 09:22  Attached To:  Hospital  Encounter on 10/10/2019  Source Information  Roxan Hockey, MD   [facility]   - Media Information   Document Information  Photos    10/21/2019 09:19  Attached To:  Hospital Encounter on 10/31/2019  Source Information  Roxan Hockey, MD   [facility]    - Media Information   Document Information  Photos    10/21/2019 09:18  Attached To:  Hospital Encounter on 10/18/2019  Source Information  Roxan Hockey, MD   [facility]   --- Media Information   Document Information  Photos    10/21/2019 09:18  Attached To:  Hospital Encounter on 10/26/2019  Source Information  Roxan Hockey, MD   [facility]   --- Media Information   Document Information  Photos    10/21/2019 09:17  Attached To:  Hospital Encounter on 10/25/2019  Source Information  Roxan Hockey, MD   [facility]        Data Review:   Micro Results Recent Results (from the past 240 hour(s))  Wound or Superficial Culture     Status: None   Collection Time: 11/03/2019  2:28 PM   Specimen: Wound  Result Value Ref Range Status   Specimen Description   Final    WOUND RIGHT ILIAC Performed at [facility], [address] [place], [place] [address]    Special Requests   Final    Normal Performed at [facility], [address] [place], [place] [address]    Gram Stain   Final    RARE WBC PRESENT, PREDOMINANTLY PMN ABUNDANT GRAM NEGATIVE RODS FEW GRAM POSITIVE COCCI RARE GRAM POSITIVE RODS Performed at [facility], [place] [address], [place],  [address]    Culture   Final    ABUNDANT ESCHERICHIA COLI ABUNDANT PROTEUS MIRABILIS    Report Status 10/20/2019 FINAL  Final   Organism ID, Bacteria ESCHERICHIA COLI  Final   Organism ID, Bacteria PROTEUS MIRABILIS  Final      Susceptibility   Escherichia coli - MIC*    AMPICILLIN >=32 RESISTANT Resistant     CEFAZOLIN >=64 RESISTANT Resistant     CEFEPIME <=0.12 SENSITIVE Sensitive     CEFTAZIDIME <=1  SENSITIVE Sensitive     CEFTRIAXONE <=0.25 SENSITIVE Sensitive     CIPROFLOXACIN <=0.25 SENSITIVE Sensitive     GENTAMICIN <=1 SENSITIVE Sensitive     IMIPENEM <=0.25 SENSITIVE Sensitive     TRIMETH/SULFA <=20 SENSITIVE Sensitive     AMPICILLIN/SULBACTAM >=32 RESISTANT Resistant     PIP/TAZO <=4 SENSITIVE Sensitive     * ABUNDANT ESCHERICHIA COLI   Proteus mirabilis - MIC*    AMPICILLIN <=2 SENSITIVE Sensitive     CEFAZOLIN <=4 SENSITIVE Sensitive     CEFEPIME <=0.12 SENSITIVE Sensitive  CEFTAZIDIME <=1 SENSITIVE Sensitive     CEFTRIAXONE <=0.25 SENSITIVE Sensitive     CIPROFLOXACIN <=0.25 SENSITIVE Sensitive     GENTAMICIN <=1 SENSITIVE Sensitive     IMIPENEM 4 SENSITIVE Sensitive     TRIMETH/SULFA <=20 SENSITIVE Sensitive     AMPICILLIN/SULBACTAM <=2 SENSITIVE Sensitive     PIP/TAZO <=4 SENSITIVE Sensitive     * ABUNDANT PROTEUS MIRABILIS  Blood Culture (routine x 2)     Status: Abnormal   Collection Time: 10/28/2019  3:15 PM   Specimen: BLOOD  Result Value Ref Range Status   Specimen Description   Final    BLOOD RIGHT ANTECUBITAL Performed at [facility], [place] [address] [place], [place] [address]    Special Requests   Final    BOTTLES DRAWN AEROBIC AND ANAEROBIC Blood Culture adequate volume Performed at [facility], [address] [place], [place] [address]    Culture  Setup Time   Final    GRAM NEGATIVE RODS Gram Stain Report Called to,Read Back By and Verified With: OLIVER,J ON 10/17/19 AT 1910 BY LOY,C PERFORMED AT [facility] ANAEROBIC BOTTLE GRAM POSITIVE COCCI AEROBIC BOTTLE ONLY Gram Stain Report Called to,Read Back By and Verified With: H EVANS,RN'@[id number]'  10/18/19 MKELLY    Culture (A)  Final    STAPHYLOCOCCUS EPIDERMIDIS THE SIGNIFICANCE OF ISOLATING THIS ORGANISM FROM A SINGLE SET OF BLOOD CULTURES WHEN MULTIPLE SETS ARE DRAWN IS UNCERTAIN. PLEASE NOTIFY THE MICROBIOLOGY DEPARTMENT WITHIN ONE WEEK IF SPECIATION AND SENSITIVITIES ARE REQUIRED. BACTEROIDES  FRAGILIS BETA LACTAMASE POSITIVE Performed at [facility], [place] [address], [place], [place] [address]    Report Status 10/19/2019 FINAL  Final  Blood Culture ID Panel (Reflexed)     Status: None   Collection Time: 10/21/2019  3:15 PM  Result Value Ref Range Status   Enterococcus species NOT DETECTED NOT DETECTED Final   Listeria monocytogenes NOT DETECTED NOT DETECTED Final   Staphylococcus species NOT DETECTED NOT DETECTED Final   Staphylococcus aureus (BCID) NOT DETECTED NOT DETECTED Final   Streptococcus species NOT DETECTED NOT DETECTED Final   Streptococcus agalactiae NOT DETECTED NOT DETECTED Final   Streptococcus pneumoniae NOT DETECTED NOT DETECTED Final   Streptococcus pyogenes NOT DETECTED NOT DETECTED Final   Acinetobacter baumannii NOT DETECTED NOT DETECTED Final   Enterobacteriaceae species NOT DETECTED NOT DETECTED Final   Enterobacter cloacae complex NOT DETECTED NOT DETECTED Final   Escherichia coli NOT DETECTED NOT DETECTED Final   Klebsiella oxytoca NOT DETECTED NOT DETECTED Final   Klebsiella pneumoniae NOT DETECTED NOT DETECTED Final   Proteus species NOT DETECTED NOT DETECTED Final   Serratia marcescens NOT DETECTED NOT DETECTED Final   Haemophilus influenzae NOT DETECTED NOT DETECTED Final   Neisseria meningitidis NOT DETECTED NOT DETECTED Final   Pseudomonas aeruginosa NOT DETECTED NOT DETECTED Final   Candida albicans NOT DETECTED NOT DETECTED Final   Candida glabrata NOT DETECTED NOT DETECTED Final   Candida krusei NOT DETECTED NOT DETECTED Final   Candida parapsilosis NOT DETECTED NOT DETECTED Final   Candida tropicalis NOT DETECTED NOT DETECTED Final    Comment: Performed at [facility], [place] [address] [place], [place] [address]  Respiratory Panel by RT PCR (Flu A&B, Covid) - Nasopharyngeal Swab     Status: None   Collection Time: 10/26/2019  5:00 PM   Specimen: Nasopharyngeal Swab  Result Value Ref Range Status   SARS Coronavirus 2 by RT PCR  NEGATIVE NEGATIVE Final  Comment: (NOTE) SARS-CoV-2 target nucleic acids are NOT DETECTED. The SARS-CoV-2 RNA is generally detectable in upper respiratoy specimens during the acute phase of infection. The lowest concentration of SARS-CoV-2 viral copies this assay can detect is 131 copies/mL. A negative result does not preclude SARS-Cov-2 infection and should not be used as the sole basis for treatment or other patient management decisions. A negative result may occur with  improper specimen collection/handling, submission of specimen other than nasopharyngeal swab, presence of viral mutation(s) within the areas targeted by this assay, and inadequate number of viral copies (<131 copies/mL). A negative result must be combined with clinical observations, patient history, and epidemiological information. The expected result is Negative. Fact Sheet for Patients:  PinkCheek.be Fact Sheet for Healthcare Providers:  GravelBags.it This test is not yet ap proved or cleared by the [place] FDA and  has been authorized for detection and/or diagnosis of SARS-CoV-2 by FDA under an Emergency Use Authorization (EUA). This EUA will remain  in effect (meaning this test can be used) for the duration of the COVID-19 declaration under Section 564(b)(1) of the Act, 21 U.S.C. section 360bbb-3(b)(1), unless the authorization is terminated or revoked sooner.    Influenza A by PCR NEGATIVE NEGATIVE Final   Influenza B by PCR NEGATIVE NEGATIVE Final    Comment: (NOTE) The Xpert Xpress SARS-CoV-2/FLU/RSV assay is intended as an aid in  the diagnosis of influenza from Nasopharyngeal swab specimens and  should not be used as a sole basis for treatment. Nasal washings and  aspirates are unacceptable for Xpert Xpress SARS-CoV-2/FLU/RSV  testing. Fact Sheet for Patients: PinkCheek.be Fact Sheet for Healthcare  Providers: GravelBags.it This test is not yet approved or cleared by the [place] FDA and  has been authorized for detection and/or diagnosis of SARS-CoV-2 by  FDA under an Emergency Use Authorization (EUA). This EUA will remain  in effect (meaning this test can be used) for the duration of the  Covid-19 declaration under Section 564(b)(1) of the Act, 21  U.S.C. section 360bbb-3(b)(1), unless the authorization is  terminated or revoked. Performed at [facility], [address] [place], [place] [address]   Blood Culture (routine x 2)     Status: None   Collection Time: 11/02/2019  5:13 PM   Specimen: Left Antecubital; Blood  Result Value Ref Range Status   Specimen Description LEFT ANTECUBITAL  Final   Special Requests   Final    BOTTLES DRAWN AEROBIC ONLY Blood Culture results may not be optimal due to an inadequate volume of blood received in culture bottles   Culture   Final    NO GROWTH 5 DAYS Performed at [facility], [address], [place], [place] [address]    Report Status 10/21/2019 FINAL  Final  Urine culture     Status: Abnormal   Collection Time: 10/17/19  2:26 PM   Specimen: In/Out Cath Urine  Result Value Ref Range Status   Specimen Description   Final    IN/OUT CATH URINE Performed at [facility], [address], [place], [place] [address]    Special Requests   Final    NONE Performed at [facility], [address] [place], [place] [address]    Culture 20,000 COLONIES/mL KLEBSIELLA PNEUMONIAE (A)  Final   Report Status 10/20/2019 FINAL  Final   Organism ID, Bacteria KLEBSIELLA PNEUMONIAE (A)  Final      Susceptibility   Klebsiella pneumoniae - MIC*    AMPICILLIN >=32 RESISTANT Resistant  CEFAZOLIN <=4 SENSITIVE Sensitive     CEFTRIAXONE <=0.25 SENSITIVE Sensitive     CIPROFLOXACIN <=0.25 SENSITIVE Sensitive     GENTAMICIN <=1 SENSITIVE Sensitive     IMIPENEM 1 SENSITIVE Sensitive     NITROFURANTOIN 64 INTERMEDIATE  Intermediate     TRIMETH/SULFA <=20 SENSITIVE Sensitive     AMPICILLIN/SULBACTAM 8 SENSITIVE Sensitive     PIP/TAZO <=4 SENSITIVE Sensitive     * 20,000 COLONIES/mL KLEBSIELLA PNEUMONIAE  Aerobic/Anaerobic Culture (surgical/deep wound)     Status: None (Preliminary result)   Collection Time: 10/17/19  6:14 PM   Specimen: Abscess  Result Value Ref Range Status   Specimen Description ABSCESS  Final   Special Requests RIGHT  Final   Gram Stain NO WBC SEEN NO ORGANISMS SEEN   Final   Culture   Final    FEW ESCHERICHIA COLI FEW PROTEUS MIRABILIS HOLDING FOR POSSIBLE ANAEROBE    Report Status PENDING  Incomplete   Organism ID, Bacteria ESCHERICHIA COLI  Final   Organism ID, Bacteria PROTEUS MIRABILIS  Final      Susceptibility   Escherichia coli - MIC*    AMPICILLIN >=32 RESISTANT Resistant     CEFAZOLIN 16 SENSITIVE Sensitive     CEFEPIME <=0.12 SENSITIVE Sensitive     CEFTAZIDIME <=1 SENSITIVE Sensitive     CEFTRIAXONE <=0.25 SENSITIVE Sensitive     CIPROFLOXACIN <=0.25 SENSITIVE Sensitive     GENTAMICIN <=1 SENSITIVE Sensitive     IMIPENEM <=0.25 SENSITIVE Sensitive     TRIMETH/SULFA <=20 SENSITIVE Sensitive     AMPICILLIN/SULBACTAM >=32 RESISTANT Resistant     PIP/TAZO Value in next row Sensitive      <=4 SENSITIVEPerformed at [facility], [address] [place], [place] [address]    * FEW ESCHERICHIA COLI   Proteus mirabilis - MIC*    AMPICILLIN Value in next row Sensitive      <=4 SENSITIVEPerformed at [place] [address] [place], [place] [address]    CEFAZOLIN Value in next row Sensitive      <=4 SENSITIVEPerformed at [place] [address], [place], [place] [address]    CEFEPIME Value in next row Sensitive      <=4 SENSITIVEPerformed at [place] [address], [place], [place] [address]    CEFTAZIDIME Value in next row Sensitive      <=4 SENSITIVEPerformed at [place] [address], [place], [place] [address]     CEFTRIAXONE Value in next row Sensitive      <=4 SENSITIVEPerformed at [place] [address] [place], [place] [address]    CIPROFLOXACIN Value in next row Sensitive      <=4 SENSITIVEPerformed at [place] [address], [place], [place] [address]    GENTAMICIN Value in next row Sensitive      <=4 SENSITIVEPerformed at [place] [address] [place], [place] [address]    IMIPENEM Value in next row Sensitive      <=4 SENSITIVEPerformed at [place] [address], [place], [place] [address]    TRIMETH/SULFA Value in next row Sensitive      <=4 SENSITIVEPerformed at [place] [address] [place], [place] [address]    AMPICILLIN/SULBACTAM Value in next row Sensitive      <=4 SENSITIVEPerformed at [place] [address], [place], [place] [address]    PIP/TAZO Value  in next row Sensitive      <=4 SENSITIVEPerformed at [place] [address] [place], [place] [address]    * FEW PROTEUS MIRABILIS  Aerobic/Anaerobic Culture (surgical/deep wound)     Status: Abnormal (Preliminary result)   Collection Time: 10/17/19  6:14 PM   Specimen: Abscess  Result Value Ref Range Status   Specimen Description   Final    ABSCESS Performed at [facility], [address] [place], [place] [address]    Special Requests   Final    LEFT Performed at [facility], [address], [place], [place] [address]    Gram Stain   Final    FEW WBC PRESENT,BOTH PMN AND MONONUCLEAR RARE GRAM VARIABLE ROD RARE GRAM POSITIVE COCCI RARE YEAST    Culture (A)  Final    MULTIPLE ORGANISMS PRESENT, NONE PREDOMINANT NO STAPHYLOCOCCUS AUREUS ISOLATED NO GROUP A STREP (S.PYOGENES) ISOLATED HOLDING FOR POSSIBLE ANAEROBE Performed at [facility], [place] [address], [place], [place] [address]    Report Status PENDING  Incomplete  MRSA PCR Screening     Status: None   Collection Time: 10/20/19  4:57 PM   Specimen: Nasal Mucosa;  Nasopharyngeal  Result Value Ref Range Status   MRSA by PCR NEGATIVE NEGATIVE Final    Comment:        The GeneXpert MRSA Assay (FDA approved for NASAL specimens only), is one component of a comprehensive MRSA colonization surveillance program. It is not intended to diagnose MRSA infection nor to guide or monitor treatment for MRSA infections. Performed at [facility], [address], [place], [place] [address]     Radiology Reports CT ABDOMEN PELVIS WO CONTRAST  Result Date: 10/29/2019 CLINICAL DATA:  Decubitus ulcers EXAM: CT ABDOMEN AND PELVIS WITHOUT CONTRAST TECHNIQUE: Multidetector CT imaging of the abdomen and pelvis was performed following the standard protocol without IV contrast. COMPARISON:  None. FINDINGS: Lower chest: No acute abnormality. Hepatobiliary: Scattered calcifications are noted within the liver. Gallbladder is well distended with multiple dependent gallstones. Pancreas: Unremarkable. No pancreatic ductal dilatation or surrounding inflammatory changes. Spleen: Normal in size without focal abnormality. Adrenals/Urinary Tract: Adrenal glands are within normal limits. Kidneys are well visualized bilaterally with renal vascular calcifications. No renal stones are seen. No obstructive changes are noted. The bladder is partially distended. Stomach/Bowel: The appendix is not well visualized although no inflammatory changes to suggest appendicitis are noted. Retained fecal material is noted throughout the colon consistent with a mild degree of constipation without obstructive change. No small bowel abnormality is seen. Stomach is unremarkable. Vascular/Lymphatic: Aortic atherosclerosis. No enlarged abdominal or pelvic lymph nodes. Reproductive: Uterus and bilateral adnexa are unremarkable. Other: No abdominal wall hernia or abnormality. No abdominopelvic ascites. Musculoskeletal: Mild changes of anasarca are noted. Decubitus ulcer is noted laterally near the right hip with  inflammatory changes extending from the skin towards the greater trochanter. This area measures approximately 7.6 x 4.3 cm. No definitive bony erosive changes to suggest osteomyelitis are noted at this time. No other definitive decubitus ulcer is seen. Degenerative changes of lumbar spine are seen. IMPRESSION: Right lateral decubitus ulcer with subcutaneous inflammatory changes extending towards the greater trochanter of the right proximal femur. No definitive bony destructive changes are seen. Considerable subcutaneous inflammatory changes noted with air tracking towards the lateral aspect of the femur. Changes of mild constipation. Cholelithiasis without complicating factors. Electronically Signed   By: Inez Catalina M.D.   On: 11/07/2019 16:51   US RENAL  Result Date: 10/18/2019 CLINICAL DATA:  Acute renal insufficiency. EXAM: RENAL / URINARY TRACT ULTRASOUND COMPLETE COMPARISON:  11/06/2019 CT FINDINGS: Right Kidney: Renal measurements: 9.3 x 4.4 x 5.8 cm = volume: 123 mL. Increased renal echogenicity. No hydronephrosis. Left Kidney: Renal measurements: 9.6 x 5.2 x 4.6 cm = volume: 119 mL. Increased renal echogenicity. No hydronephrosis. Bladder: Decompressed. Other: Trace perinephric edema/fluid likely relates to renal insufficiency. Note is made of gallbladder sludge and stones including at 1.0 cm. IMPRESSION: 1. No hydronephrosis. Increased renal echogenicity, consistent with medical renal disease. 2. Cholelithiasis without acute cholecystitis. Electronically Signed   By: Abigail Miyamoto M.D.   On: 10/18/2019 11:51   DG Chest Port 1 View  Result Date: 10/29/2019 CLINICAL DATA:  Sepsis EXAM: PORTABLE CHEST 1 VIEW COMPARISON:  11/05/2014 FINDINGS: Stable positioning of a left-sided implanted cardiac device. Post CABG changes. Stable cardiomediastinal contours. No focal airspace consolidation, pleural effusion, or pneumothorax. IMPRESSION: No acute cardiopulmonary findings. Electronically Signed   By:  Davina Poke D.O.   On: 11/04/2019 14:47   [place] EKG SITE RITE  Result Date: 10/20/2019 If Site Rite image not attached, placement could not be confirmed due to current cardiac rhythm.    CBC Recent Labs  Lab 10/23/2019 1515 10/15/2019 1515 10/17/19 0607 10/17/19 1455 10/18/19 0611 10/19/19 0500 10/20/19 0709 10/21/19 1633  WBC 21.7*   < > 25.6*  --  24.5* 19.5* 22.1* 19.8*  HGB 8.6*   < > 9.4*  --  8.2* 8.2* 7.9* 6.9*  HCT 28.9*   < > 32.6* 27.9* 28.6* 29.0* 27.3* 22.6*  PLT 299   < > 260  --  226 197 206 188  MCV 100.3*   < > 104.8*  --  102.5* 103.6* 100.7* 97.0  MCH 29.9   < > 30.2  --  29.4 29.3 29.2 29.6  MCHC 29.8*   < > 28.8*  --  28.7* 28.3* 28.9* 30.5  RDW 14.4   < > 14.5  --  14.6 14.8 15.2 15.6*  LYMPHSABS 0.8  --   --   --   --   --   --   --   MONOABS 0.7  --   --   --   --   --   --   --   EOSABS 0.0  --   --   --   --   --   --   --   BASOSABS 0.0  --   --   --   --   --   --   --    < > = values in this interval not displayed.    Chemistries  Recent Labs  Lab 10/24/2019 1515 11/07/2019 1515 10/14/2019 1740 10/17/19 0607 10/18/19 0611 10/19/19 0500 10/20/19 0709 10/21/19 1633  NA 147*   < >  --  148* 142 138 134* 137  K 4.4   < >  --  4.3 4.1 4.3 4.2 4.1  CL 112*   < >  --  116* 112* 111 107 112*  CO2 24   < >  --  20* 20* 15* 17* 17*  GLUCOSE 195*   < >  --  166* 188* 196* 113* 125*  BUN 54*   < >  --  54* 56* 60* 60* 57*  CREATININE 1.83*   < >  --  1.67* 1.81* 1.74* 1.82* 1.72*  CALCIUM 7.9*   < >  --  7.6* 7.1* 6.7* 6.7* 6.7*  MG  --   --  2.4  --  2.3  --   --  1.9  AST 20  --   --   --   --   --   --   --   ALT 13  --   --   --   --   --   --   --   ALKPHOS 81  --   --   --   --   --   --   --   BILITOT 0.8  --   --   --   --   --   --   --    < > = values in this interval not displayed.   ------------------------------------------------------------------------------------------------------------------ No results for input(s): CHOL, HDL,  LDLCALC, TRIG, CHOLHDL, LDLDIRECT in the last 72 hours.  Lab Results  Component Value Date   HGBA1C 5.5 10/19/2019   ------------------------------------------------------------------------------------------------------------------ No results for input(s): TSH, T4TOTAL, T3FREE, THYROIDAB in the last 72 hours.  Invalid input(s): FREET3 ------------------------------------------------------------------------------------------------------------------ No results for input(s): VITAMINB12, FOLATE, FERRITIN, TIBC, IRON, RETICCTPCT in the last 72 hours.  Coagulation profile Recent Labs  Lab 10/15/2019 1515  INR 1.1    No results for input(s): DDIMER in the last 72 hours.  Cardiac Enzymes No results for input(s): CKMB, TROPONINI, MYOGLOBIN in the last 168 hours.  Invalid input(s): CK ------------------------------------------------------------------------------------------------------------------    Component Value Date/Time   BNP 583.7 (H) 11/01/2014 6761     Roxan Hockey M.D on 10/21/2019 at 6:37 PM  Go to [email or web address] - for contact info  [facility] - Office  [phone]

## 2019-10-21 NOTE — Progress Notes (Signed)
Purpose of PICC line explained to family. Concerns related to giving Levophed in a peripheral IV explained to family.  Son, Gala Romney, said they wanted to wait until morning to decide about the PICC line.  Vascular access in to offer PICC placement, but was asked to wait.  Lab and RT unable to draw adequate blood samples for analysis.

## 2019-10-21 NOTE — H&P (Signed)
I was called by the ICU nurse because patients family had some questions and they were concerned about emergent GI consult . I explained to the family that on repeated H and H , there is no significant drop in Hb and GI will see the patient in the morning . I also fully explained them the management and explained them the need of a central line. Patient's son stated that he doesn't want to put a central line . The poor prognosis is also explained.

## 2019-10-21 NOTE — Progress Notes (Signed)
Spoke with Tiffany RN [facility] re plan to place PICC today.  She is to speak with family re obtaining consent. Will plan PICC placement accordingly.

## 2019-10-21 NOTE — Progress Notes (Signed)
Patient scheduled for PICC placement in the morning by Ashok Cordia, Database administrator.

## 2019-10-22 ENCOUNTER — Encounter (HOSPITAL_COMMUNITY): Payer: Self-pay | Admitting: Internal Medicine

## 2019-10-22 DIAGNOSIS — A419 Sepsis, unspecified organism: Secondary | ICD-10-CM

## 2019-10-22 DIAGNOSIS — R6521 Severe sepsis with septic shock: Secondary | ICD-10-CM

## 2019-10-22 DIAGNOSIS — D62 Acute posthemorrhagic anemia: Secondary | ICD-10-CM

## 2019-10-22 LAB — CBC
HCT: 32.6 % — ABNORMAL LOW (ref 36.0–46.0)
Hemoglobin: 10.3 g/dL — ABNORMAL LOW (ref 12.0–15.0)
MCH: 29.5 pg (ref 26.0–34.0)
MCHC: 31.6 g/dL (ref 30.0–36.0)
MCV: 93.4 fL (ref 80.0–100.0)
Platelets: 117 10*3/uL — ABNORMAL LOW (ref 150–400)
RBC: 3.49 MIL/uL — ABNORMAL LOW (ref 3.87–5.11)
RDW: 16.1 % — ABNORMAL HIGH (ref 11.5–15.5)
WBC: 12.5 10*3/uL — ABNORMAL HIGH (ref 4.0–10.5)
nRBC: 0 % (ref 0.0–0.2)

## 2019-10-22 LAB — COMPREHENSIVE METABOLIC PANEL
ALT: 13 U/L (ref 0–44)
AST: 19 U/L (ref 15–41)
Albumin: 2.1 g/dL — ABNORMAL LOW (ref 3.5–5.0)
Alkaline Phosphatase: 85 U/L (ref 38–126)
Anion gap: 12 (ref 5–15)
BUN: 57 mg/dL — ABNORMAL HIGH (ref 8–23)
CO2: 15 mmol/L — ABNORMAL LOW (ref 22–32)
Calcium: 6.8 mg/dL — ABNORMAL LOW (ref 8.9–10.3)
Chloride: 111 mmol/L (ref 98–111)
Creatinine, Ser: 1.68 mg/dL — ABNORMAL HIGH (ref 0.44–1.00)
GFR calc Af Amer: 32 mL/min — ABNORMAL LOW (ref >60)
GFR calc non Af Amer: 28 mL/min — ABNORMAL LOW (ref >60)
Glucose, Bld: 141 mg/dL — ABNORMAL HIGH (ref 70–99)
Potassium: 3.8 mmol/L (ref 3.5–5.1)
Sodium: 138 mmol/L (ref 135–145)
Total Bilirubin: 1 mg/dL (ref 0.3–1.2)
Total Protein: 5.3 g/dL — ABNORMAL LOW (ref 6.5–8.1)

## 2019-10-22 LAB — AEROBIC/ANAEROBIC CULTURE W GRAM STAIN (SURGICAL/DEEP WOUND): Gram Stain: NONE SEEN

## 2019-10-22 LAB — GLUCOSE, CAPILLARY
Glucose-Capillary: 114 mg/dL — ABNORMAL HIGH (ref 70–99)
Glucose-Capillary: 119 mg/dL — ABNORMAL HIGH (ref 70–99)
Glucose-Capillary: 121 mg/dL — ABNORMAL HIGH (ref 70–99)
Glucose-Capillary: 131 mg/dL — ABNORMAL HIGH (ref 70–99)
Glucose-Capillary: 132 mg/dL — ABNORMAL HIGH (ref 70–99)

## 2019-10-22 MED ORDER — SODIUM CHLORIDE 0.9 % IV SOLN
2.0000 g | INTRAVENOUS | Status: AC
  Administered 2019-10-22 – 2019-10-28 (×7): 2 g via INTRAVENOUS
  Filled 2019-10-22 (×7): qty 20

## 2019-10-22 MED ORDER — HYDROCORTISONE ACETATE 25 MG RE SUPP
25.0000 mg | Freq: Two times a day (BID) | RECTAL | Status: DC
  Administered 2019-10-22 – 2019-11-16 (×46): 25 mg via RECTAL
  Filled 2019-10-22 (×43): qty 1

## 2019-10-22 MED ORDER — LACTULOSE 10 GM/15ML PO SOLN: 30.0000 g | Freq: Once | ORAL | Status: DC

## 2019-10-22 MED ORDER — POLYETHYLENE GLYCOL 3350 17 G PO PACK
17.0000 g | PACK | Freq: Every day | ORAL | Status: DC
  Administered 2019-11-01: 17 g via ORAL
  Filled 2019-10-22 (×7): qty 1

## 2019-10-22 NOTE — Progress Notes (Signed)
Present with [name] and Kelly Faye for support. [name] shared his concern about her health changes over the past few months and importantly what may be happening within her during this time. He shared she had begin telling him she was tired and the implications he felt might be speaking more to end of life instead of fatigue. We spent time talking about who she is, their shared lives and family/faith and his ability to move through acceptance through the love and faith their lives have been based on. We prayed and I will continue to support.

## 2019-10-22 NOTE — Progress Notes (Signed)
[facility]  Doing fair. Weaning off levophed. Less restless this AM.   Wounds inspected. Debridement with sharp scissors, excisional debridement of dead fibrinous tissue and dead muscle in the right hip, and excisional debridement fibrinous tissue on the left hip.  Santyl and dressing changes to both sides twice a day.   Discussed with Dr. Mariea Clonts and patient's husband.  Will look at wounds again on Wednesday.   Algis Greenhouse, MD [facility] [address] [place], [place] [address] [phone] (office)

## 2019-10-22 NOTE — Progress Notes (Signed)
Patient titrated off Levophed today at around 1330 and has so far sustained blood pressures SBP >90's. Dr Marisa Severin aware. After several attempts to see if patient would eat anything (ice cream and/or applesauce) or drink anything, per Dr Marisa Severin, Patient refused and would not open mouth at all and would clamp her mouth shut whenever we Hydrographic surveyor, husband and daughter) tried to offer these things. Dr Marisa Severin aware. Dressings to bilateral hips changed with Dr Henreitta Leber assist. Husband in room while doing dressing change and educated throughout the process. Husband and daughter who were present in the room updated on patient status and what was going on with her medications, personal care and fluids infusing.

## 2019-10-22 NOTE — Consult Note (Addendum)
Referring Provider: Dr. Carles Collet  Primary Care Physician:  [name], MD Primary Gastroenterologist:  Previously unassigned, Dr. Gala Romney   Date of Admission: 10/23/2019 Date of Consultation: 10/22/19  Reason for Consultation:  Hematochezia   HPI:  [name] is an 85 y.o. year old female admitted with severe septic shock multifactorial in setting of UTI, wound decubitus ulcer/infection, bacteremia, requiring pressure support with Levophed. Rectal bleeding noted on 3/13 during dressing change. Hgb had been in the 8-9 range during admission (previously 11 in 2016), down to 6.9 yesterday and receiving 2 units PRBCs. Aspirin and Plavix were held when rectal bleeding first noticed.   Husband at bedside. Patient does not respond to questions. History obtained from husband. Denies any prior endoscopic procedures. Patient did not report any abdominal pain prior to admission. No prior rectal bleeding noted. Poor appetite prior to admission. No family history of colorectal cancer or polyps that husband is aware.   Per nursing staff, small smears of BM over the weekend. Small amount of rectal bleeding on the 13th but none noticed since that time.    Past Medical History:  Diagnosis Date  . Alzheimer's dementia ([place])   . Chronic systolic CHF (congestive heart failure) ([place])    a. 04/2012 EF 25-30%, viable myocardium by rest thallium redistribution   . Coronary artery disease    a. 2009 CABG x 3(LIMA->LAD, VG->RAMUS, VG->OM;  b. 04/2012 Cath/PCI: LM nl, LAD 50-70p,78m, RI 158m, LCX occluded OM's, RCA 55m, 30-40d, PDA 70p, VG->RI nl, VG->OM nl, LIMA->LAD nl, EF 25-30%. RCA stented w/ 2.25x28 & 2.5x68mm Xpedition DES'.   . Hyperlipidemia   . Hypertension   . Ischemic cardiomyopathy   . Polymorphic ventricular tachycardia ([place])   . S/P ICD (internal cardiac defibrillator) procedure, with upgrade to ICD/CRT-D  Medtronic 11/06/2014  . Type II diabetes mellitus ([place])     Past Surgical History:  Procedure  Laterality Date  . BI-VENTRICULAR IMPLANTABLE CARDIOVERTER DEFIBRILLATOR UPGRADE N/A 11/04/2014   Initial ICD implanted in [place] by Dr Tana Coast.  Upgraded to MDT BiV ICD by Dr Caryl Comes for bradycardia dependant PMVT  . CARDIAC CATHETERIZATION  04/07/2012  . CORONARY ARTERY BYPASS GRAFT  ~ 2009   CABG X1  . goiter removed    . LEFT AND RIGHT HEART CATHETERIZATION WITH CORONARY/GRAFT ANGIOGRAM N/A 04/07/2012   Procedure: LEFT AND RIGHT HEART CATHETERIZATION WITH Beatrix Fetters;  Surgeon: Thayer Headings, MD;  Location: [facility] CATH LAB;  Service: Cardiovascular;  Laterality: N/A;  . PERCUTANEOUS CORONARY STENT INTERVENTION (PCI-S) N/A 04/11/2012   Procedure: PERCUTANEOUS CORONARY STENT INTERVENTION (PCI-S);  Surgeon: Hillary Bow, MD;  Location: [facility] CATH LAB;  Service: Cardiovascular;  Laterality: N/A;    Prior to Admission medications   Medication Sig Start Date End Date Taking? Authorizing Provider  acetaminophen (TYLENOL) 325 MG tablet Take 1-2 tablets (325-650 mg total) by mouth every 4 (four) hours as needed for mild pain. 11/06/14  Yes Isaiah Serge, NP  aspirin EC 81 MG tablet Take 81 mg by mouth daily.   Yes [provider]  atorvastatin (LIPITOR) 80 MG tablet TAKE 1 TABLET BY MOUTH EVERY DAY 06/19/18  Yes Branch, Alphonse Guild, MD  carvedilol (COREG) 25 MG tablet TAKE 1 TABLET BY MOUTH TWICE A DAY 06/22/19  Yes Arnoldo Lenis, MD  clopidogrel (PLAVIX) 75 MG tablet TAKE 1 TABLET BY MOUTH EVERY DAY 04/12/18  Yes Branch, Alphonse Guild, MD  ferrous gluconate (FERGON) 324 MG tablet Take 324 mg by mouth daily. 09/12/19  Yes [provider]  hydrALAZINE (APRESOLINE) 50 MG tablet TAKE 1 TABLET (50 MG TOTAL) BY MOUTH EVERY 8 (EIGHT) HOURS. 06/19/18  Yes BranchDorothe Pea, MD  ibuprofen (ADVIL) 200 MG tablet Take 200 mg by mouth every 6 (six) hours as needed for mild pain.   Yes [provider]  KLOR-CON M20 20 MEQ tablet TAKE 1 TABLET BY MOUTH EVERY DAY 08/21/18  Yes  Branch, Dorothe Pea, MD  levothyroxine (SYNTHROID, LEVOTHROID) 25 MCG tablet TAKE 1 TABLET BY MOUTH DAILY BEFORE BREAKFAST. Patient taking differently: Take 25 mcg by mouth daily before breakfast.  11/09/17  Yes Leone Brand, NP  magnesium oxide (MAG-OX) 400 MG tablet Take 1 tablet (400 mg total) by mouth daily. 05/26/18  Yes BranchDorothe Pea, MD  meclizine (ANTIVERT) 25 MG tablet Take 25 mg by mouth 3 (three) times daily as needed for dizziness.   Yes [provider]  memantine (NAMENDA) 10 MG tablet Take 10 mg by mouth 2 (two) times daily. 10/23/14  Yes [provider]  nitroGLYCERIN (NITROLINGUAL) 0.4 MG/SPRAY spray Place 1 spray under the tongue every 5 (five) minutes x 3 doses as needed for chest pain (up to 3 doses, if no relief after 3rd dose, proceed to the ED for an evaluation). 05/26/18  Yes BranchDorothe Pea, MD  oxyCODONE-acetaminophen (PERCOCET/ROXICET) 5-325 MG tablet Take 1 tablet by mouth as needed. 09/18/19  Yes [provider]  sacubitril-valsartan (ENTRESTO) 97-103 MG Take 1 tablet by mouth 2 (two) times daily. 10/26/18  Yes BranchDorothe Pea, MD  spironolactone (ALDACTONE) 25 MG tablet Take 0.5 tablets (12.5 mg total) by mouth daily. 05/26/18 10/19/19 Yes Branch, Dorothe Pea, MD  furosemide (LASIX) 40 MG tablet Take 1.5 tablets (60 mg total) by mouth daily. 01/17/18 06/13/19  Antoine Poche, MD    Current Facility-Administered Medications  Medication Dose Route Frequency Provider Last Rate Last Admin  . 0.9 %  sodium chloride infusion (Manually program via Guardrails IV Fluids)   Intravenous Once Tat, David, MD      . 0.9 %  sodium chloride infusion (Manually program via Guardrails IV Fluids)   Intravenous Once Tat, David, MD      . 0.9 %  sodium chloride infusion (Manually program via Guardrails IV Fluids)   Intravenous Once Emokpae, Courage, MD      . 0.9 %  sodium chloride infusion   Intravenous PRN Catarina Hartshorn, MD 10 mL/hr at 10/21/19 2018 Restarted  at 10/21/19 2018  . acetaminophen (TYLENOL) tablet 650 mg  650 mg Oral Q6H PRN Tat, Onalee Hua, MD   650 mg at 10/19/19 1110   Or  . acetaminophen (TYLENOL) suppository 650 mg  650 mg Rectal Q6H PRN Tat, Onalee Hua, MD      . atorvastatin (LIPITOR) tablet 80 mg  80 mg Oral Daily Tat, Onalee Hua, MD   80 mg at 10/20/19 0912  . ceFEPIme (MAXIPIME) 2 g in sodium chloride 0.9 % 100 mL IVPB  2 g Intravenous Q24H Catarina Hartshorn, MD   Stopped at 10/22/19 0002  . Chlorhexidine Gluconate Cloth 2 % PADS 6 each  6 each Topical Daily Tat, David, MD   6 each at 10/21/19 1029  . collagenase (SANTYL) ointment   Topical BID Catarina Hartshorn, MD   1 application at 10/21/19 2200  . dextrose 5 %-0.45 % sodium chloride infusion   Intravenous Continuous Emokpae, Courage, MD 50 mL/hr at 10/22/19 0830 Rate Verify at 10/22/19 0830  . feeding supplement (ENSURE ENLIVE) (ENSURE  ENLIVE) liquid 237 mL  237 mL Oral TID Emokpae, Courage, MD      . fentaNYL (SUBLIMAZE) injection 25 mcg  25 mcg Intravenous Q3H PRN Emokpae, Courage, MD      . folic acid (FOLVITE) tablet 1 mg  1 mg Oral Daily Tat, David, MD   1 mg at 10/20/19 0912  . HYDROcodone-acetaminophen (NORCO/VICODIN) 5-325 MG per tablet 1 tablet  1 tablet Oral Q6H PRN Catarina Hartshorn, MD   1 tablet at 10/20/19 0927  . hydrocortisone (ANUSOL-HC) suppository 25 mg  25 mg Rectal BID Gelene Mink, NP      . levothyroxine (SYNTHROID, LEVOTHROID) injection 37.5 mcg  37.5 mcg Intravenous Q0600 Catarina Hartshorn, MD   37.5 mcg at 10/22/19 0526  . LORazepam (ATIVAN) injection 0.5 mg  0.5 mg Intravenous Q6H PRN Tat, Onalee Hua, MD   0.5 mg at 10/20/19 1318  . MEDLINE mouth rinse  15 mL Mouth Rinse BID Mariea Clonts, Courage, MD   15 mL at 10/21/19 2148  . memantine (NAMENDA) tablet 10 mg  10 mg Oral BID Catarina Hartshorn, MD   10 mg at 10/20/19 0912  . metroNIDAZOLE (FLAGYL) IVPB 500 mg  500 mg Intravenous Andres Labrum, MD 100 mL/hr at 10/22/19 0830 Rate Verify at 10/22/19 0830  . norepinephrine (LEVOPHED) 16 mg in premix  infusion  0-40 mcg/min Intravenous Titrated Emokpae, Courage, MD 6.56 mL/hr at 10/22/19 0805 7 mcg/min at 10/22/19 0805  . ondansetron (ZOFRAN) injection 4 mg  4 mg Intravenous Q6H PRN Tat, David, MD      . pantoprazole (PROTONIX) injection 40 mg  40 mg Intravenous Q12H Shon Hale, MD   40 mg at 10/21/19 2138  . polyethylene glycol (MIRALAX / GLYCOLAX) packet 17 g  17 g Oral Daily PRN Tat, David, MD      . sodium chloride flush (NS) 0.9 % injection 10-40 mL  10-40 mL Intracatheter Q12H Emokpae, Courage, MD   10 mL at 10/21/19 2100  . sodium chloride flush (NS) 0.9 % injection 10-40 mL  10-40 mL Intracatheter PRN Emokpae, Courage, MD      . sodium chloride flush (NS) 0.9 % injection 3 mL  3 mL Intravenous Pablo Ledger, MD   3 mL at 10/21/19 2142  . sodium chloride flush (NS) 0.9 % injection 3 mL  3 mL Intravenous PRN Emokpae, Courage, MD      . vitamin B-12 (CYANOCOBALAMIN) tablet 500 mcg  500 mcg Oral Daily Tat, David, MD   500 mcg at 10/20/19 0912    Allergies as of 10-21-2019  . (No Known Allergies)    Family History  Problem Relation Age of Onset  . Colon cancer Neg Hx   . Colon polyps Neg Hx     Social History   Socioeconomic History  . Marital status: Married    Spouse name: Not on file  . Number of children: Not on file  . Years of education: Not on file  . Highest education level: Not on file  Occupational History  . Not on file  Tobacco Use  . Smoking status: Never Smoker  . Smokeless tobacco: Never Used  Substance and Sexual Activity  . Alcohol use: No    Alcohol/week: 0.0 standard drinks  . Drug use: No  . Sexual activity: Yes  Other Topics Concern  . Not on file  Social History Narrative  . Not on file   Social Determinants of Health   Financial Resource Strain:   . Difficulty  of Paying Living Expenses:   Food Insecurity:   . Worried About Programme researcher, broadcasting/film/videounning Out of Food in the Last Year:   . Baristaan Out of Food in the Last Year:   Transportation Needs:   .  Freight forwarderLack of Transportation (Medical):   Marland Kitchen. Lack of Transportation (Non-Medical):   Physical Activity:   . Days of Exercise per Week:   . Minutes of Exercise per Session:   Stress:   . Feeling of Stress :   Social Connections:   . Frequency of Communication with Friends and Family:   . Frequency of Social Gatherings with Friends and Family:   . Attends Religious Services:   . Active Member of Clubs or Organizations:   . Attends BankerClub or Organization Meetings:   Marland Kitchen. Marital Status:   Intimate Partner Violence:   . Fear of Current or Ex-Partner:   . Emotionally Abused:   Marland Kitchen. Physically Abused:   . Sexually Abused:     Review of Systems: Unable to obtain from patient. Limited ROS as per above provided by husband.   Physical Exam: Vital signs in last 24 hours: Temp:  [97.3 F (36.3 C)-98.2 F (36.8 C)] 98.2 F (36.8 C) (03/15 0734) Pulse Rate:  [59-114] 69 (03/15 0845) Resp:  [10-24] 11 (03/15 0845) BP: (80-163)/(41-101) 138/68 (03/15 0845) SpO2:  [79 %-100 %] 100 % (03/15 0845) Weight:  [80.2 kg] 80.2 kg (03/15 0530) Last BM Date: 10/18/19 General:   Resting with eyes closed, squeezes my hand when asked. Does not open eyes to verbal stimuli. Husband at bedside.  Head:  Normocephalic and atraumatic. Lungs:  Scattered rhonchi, no distress, O2 sats 100% on room air  Heart:  S1 S2 present without obvious murmur Abdomen:  Soft, nontender and nondistended. No HSM. Possible small umbilical hernia Rectal:  Husband at bedside. With 2 nurses present, patient turned to side. External exam without thrombosed external hemorrhoids. Friable rectal tissue, with internal exam noting hard stool ball just inside rectum. Removed small golf-sized fecal ball; patient with discomfort but calm after DRE. Small amount of bright red blood oozing from rectum after exam. No obvious mass appreciated Msk:  Symmetrical without gross deformities. Normal posture. Extremities:  With pedal edema Neurologic:  Unable to  assess orientation Skin:  Sacral decubiti with dressing in place   Intake/Output from previous day: 03/14 0701 - 03/15 0700 In: 2599.5 [I.V.:1669.5; Blood:630; IV Piggyback:300] Out: 550 [Urine:550] Intake/Output this shift: Total I/O In: 1166.7 [I.V.:940; IV Piggyback:226.7] Out: -   Lab Results: Recent Labs    10/20/19 0709 10/21/19 1633  WBC 22.1* 19.8*  HGB 7.9* 6.9*  HCT 27.3* 22.6*  PLT 206 188   BMET Recent Labs    10/20/19 0709 10/21/19 1633  NA 134* 137  K 4.2 4.1  CL 107 112*  CO2 17* 17*  GLUCOSE 113* 125*  BUN 60* 57*  CREATININE 1.82* 1.72*  CALCIUM 6.7* 6.7*     Studies/Results: EXAM: CT ABDOMEN AND PELVIS WITHOUT CONTRAST  TECHNIQUE: Multidetector CT imaging of the abdomen and pelvis was performed following the standard protocol without IV contrast.  COMPARISON:  None.  FINDINGS: Lower chest: No acute abnormality.  Hepatobiliary: Scattered calcifications are noted within the liver. Gallbladder is well distended with multiple dependent gallstones.  Pancreas: Unremarkable. No pancreatic ductal dilatation or surrounding inflammatory changes.  Spleen: Normal in size without focal abnormality.  Adrenals/Urinary Tract: Adrenal glands are within normal limits. Kidneys are well visualized bilaterally with renal vascular calcifications. No renal stones are seen.  No obstructive changes are noted. The bladder is partially distended.  Stomach/Bowel: The appendix is not well visualized although no inflammatory changes to suggest appendicitis are noted. Retained fecal material is noted throughout the colon consistent with a mild degree of constipation without obstructive change. No small bowel abnormality is seen. Stomach is unremarkable.  Vascular/Lymphatic: Aortic atherosclerosis. No enlarged abdominal or pelvic lymph nodes.  Reproductive: Uterus and bilateral adnexa are unremarkable.  Other: No abdominal wall hernia or  abnormality. No abdominopelvic ascites.  Musculoskeletal: Mild changes of anasarca are noted. Decubitus ulcer is noted laterally near the right hip with inflammatory changes extending from the skin towards the greater trochanter. This area measures approximately 7.6 x 4.3 cm. No definitive bony erosive changes to suggest osteomyelitis are noted at this time. No other definitive decubitus ulcer is seen. Degenerative changes of lumbar spine are seen.  IMPRESSION: Right lateral decubitus ulcer with subcutaneous inflammatory changes extending towards the greater trochanter of the right proximal femur. No definitive bony destructive changes are seen. Considerable subcutaneous inflammatory changes noted with air tracking towards the lateral aspect of the femur.  Changes of mild constipation.  Cholelithiasis without complicating factors.   Electronically Signed   By: Alcide Clever M.D.   On: 10-26-19 16:51  Impression: 85 year old female admitted with septic shock multifactorial in setting of UTI, wound decubitus ulcer/infection, and bacteremia, with reported rectal bleeding noted during dressing change. Acute on chronic anemia with Hgb down to 6.9 from 8-9 range on admission, s/p 2 units PRBCs today. Post-transfusion H/H pending.  Rectal exam at bedside with nurses present revealed small brown stool ball in rectum, disimpacted today. Some oozing from rectum thereafter likely due to internal hemorrhoids. No obvious fissure. No obvious mass on DRE. From report, rectal bleeding has not been significant this admission. Anemia multifactorial in setting of acute illness, likely occult GI blood loss in setting of aspirin and Plavix that has now been discontinued. Patient is not a candidate for endoscopic procedures at this time and remains on pressor support. I discussed this with husband at bedside. Recommend supportive measures for now and monitor for any significant overt GI bleeding.  Husband stated understanding. Agree with palliative care involvement.   Plan: Anusol suppositories BID per rectum Continue to follow H/H, transfuse as needed Monitor for overt GI bleeding Continue IV PPI BID Avoid constipation Not a candidate for endoscopic procedures at this time Agree with palliative care involvement   Gelene Mink, PhD, ANP-BC [facility]      LOS: 6 days    10/22/2019, 9:26 AM

## 2019-10-22 NOTE — Progress Notes (Addendum)
Palliative: [name] is lying quietly in bed.  She does not open her eyes or try to interact with me in any way.  She frowns when I rub her chest, but does not try to block my hand.  She is unable to make her basic needs known.  There is no family at bedside at this time.  Return later in the day to find [name].  [name] remains unchanged, and will briefly open her eyes, but not communicate in any meaningful way.  I asked [name] how long it has been since his wife had meaningful conversation with him.  He states that over the last month she has stopped talking as much, will call his name, and call out for her deceased sister.  Mr. Smarr and I talk about his wife's wounds, he was able to be present to bedside this morning per surgeon.  He states that his daughter shared that it may take several months to heal these wounds.  I share my worry that [name]'s bedsores will never be able to heal.  [name] nods his head, sharing that that is his worry also.  I asked [name] where he sees his wife going when she is able to leave the hospital.  I shared my concern about how she would do in a rehab/nursing home, but also share my worry and his ability to care for her at home.  [name] states that his son and talked about another service that could come into the home and help (not sure if this is palliative services or home health).  We talked about home health services, in-home palliative services, and hospice services.  Conference with attending, bedside nursing staff related to patient condition, needs, family dynamics.  Plan:   At this point continue to treat the treatable but no extraordinary measures such as CPR or intubation.  Considering disposition choices.  It seems that [name] is unable to participate in rehab, and would likely need long-term/custodial care if she were to discharge to SNF.  She would clearly benefit from hospice type care, but family is not ready for  hospice at this point  40 minutes Kelly Carmel, NP Palliative Medicine Team Team Phone # [phone] Greater than 50% of this time was spent counseling and coordinating care related to the above assessment and plan.

## 2019-10-22 NOTE — Progress Notes (Signed)
Patient Demographics:    [name], is a 85 y.o. female, DOB - 02-28-1935, OIZ:[id number]  Admit date - 10/31/2019   Admitting Physician Ejiroghene Arlyce Dice, MD  Outpatient Primary MD for the patient is [name], MD  LOS - 6   Chief Complaint  Patient presents with  . Skin Ulcer        Subjective:    [name] today has no fevers, no emesis,  No chest pain,   -Husband at bedside, Dr. Constance Haw at bedside, -Oral intake remains challenging -Weaning off IV Levophed -Able to come off warming blanket  Assessment  & Plan :    Principal Problem:   Severe sepsis with septic shock - Klebsiella/Proteus/E. coli/Bacteroides and Staph  Active Problems:   S/P ICD (internal cardiac defibrillator) procedure, with upgrade to ICD/CRT-D  [employer]   Cardiomyopathy, ischemic EF 20 to 25 %/history of combined systolic and diastolic dysfunction CHF   Decubitus ulcer of right hip, stage 4 /Infected with E-coli and Proteus   Failure to thrive in adult   Decubitus ulcer of left hip, stage 3/Infected with E-coli and Proteus   Goals of care, counseling/discussion   Hematochezia   Sepsis secondary to Klebsiella UTI ([name])   Anemia due to acute blood loss/rectal bleeding   Type II diabetes mellitus ([name])   AKI (acute kidney injury) ([name])   Hypertension   Mobitz type 2 second degree atrioventricular block   Polymorphic ventricular tachycardia (HCC)   Sepsis (HCC)   Stage I pressure ulcer of sacral region   Bacteremia due to Gram-negative bacteria   Gram negative sepsis ([name])   Palliative care by specialist   Encounter for hospice care discussion   Brief summary 85 year old female with a history of advanced Alzheimer's dementia, CAD, H/o combined systolic and diastolic dysfunction CHF/HFrEF/ischemic cardiomyopathy with EF 20 to 25%, h/o torsades VT status post AICD, H/o HTN, DM2, HLD, admitted on 10/27/2019  from home with metabolic encephalopathy in the setting of presumed infection (leukocytosis and Lactic acid was 3.1) with multiple decubitus ulcers --Able to come off warming blanket -Weaning off IV Levophed   Assessment/Plan: 1)Severe sepsis with septic shock-- POA -Etiology is multifactorial:-Patient has Klebsiella UTI (urine cx 10/17/19), E. coli and Proteus wound decubitus ulcer/wound infection (wound cx from 11/03/2019 and 10/17/19) and staph epi and Bacteroides bacteremia from blood culture dated 10/10/2019 ---Able to come off warming blanket -Weaning off IV Levophed --Stop vancomycin after 10/21/2019 -MRSA PCR negative on 10/20/2019 -De-escalate to IV Rocephin and metronidazole on 10/22/2019 Stop cefepime -Repeat blood culture from 10/21/2019 NGTD -Patient remains encephalopathic, at baseline she does have advanced dementia however with cognitive and memory deficits -WBC is down to 12.5 from a peak of 25.6 -PCT 2.91, ESR 60, CRP 14.1 -Patient remains critically ill, overall prognosis remains poor  2)Bacteremia--B.fragilis and staph epi--- ???  Significance of blood culture from 11/04/2019 -??  Contaminant, however patient is quite septic --Sepsis pathophysiology and Antibiotics as above #1 -Repeat blood cultures on 10/21/2019 NGTD  3)Infected Bilateral Hip and Sacral decubitus ulcers--please see photos in epic -10/25/2019 CT abdomen/pelvis--right lateral decubitus ulcer with subcutaneous inflammatory changes extending toward the greater trochanter. There is no bony destructive changes. There is considerable inflammatory change with air tracking toward the lateral  aspect of the femur. -General surgery consultappreciated -10/17/19--bedside debridement -Wound cultures from 10/25/2019 and 10/17/2019 with E. coli and Proteus -Sepsis pathophysiology and Antibiotics as above #1  4) Klebsiella UTI--urine culture from 10/17/2019 noted --Sepsis pathophysiology and Antibiotics as above #1  5)Failure  to thrive -Serum B12--199-->supplement -TSH--37.364, Free T4--0.28 -Folate--7.2 -PT evaluation-->SNF -continues to have poor po intake -Cognitive status and encephalopathy makes it difficult to feed orally -Severe sepsis with septic shock precludes PEG tube placement/endoscopy -Continue judicious IV dextrose fluids given that patient's EF is down to 20 to 25% -Ensure supplements as ordered if patient awake enough to tolerate  6) symptomatic acute on chronic blood loss anemia due to rectal bleeding-hemoglobin is up  to 10.3 from 6.9 after transfusion of 2 units of PRBC on 10/21/2019 --- Hypothermia has resolved, hypotension resolved -No further rectal bleeding -Protonix for GI prophylaxis GI consult appreciated suspect rectal injury from fecal impaction--suppositories as ordered --Severe sepsis with septic shock and hemodynamic instability precludes endoluminal evaluation  7)CKD stage 3b -initially felt to be AKI but now more likely progression of CKD -pt likely has progression of underlying CKD -Creatinine hovering below 2 -No recent renal labs available to compare current values to -renal US-->medical renal disease - renally adjust medications, avoid nephrotoxic agents / dehydration  / hypotension  8)H/o combined systolic and diastolic dysfunction CHF/HFrEF/ischemic cardiomyopathy with EF 20 to 25%- -02/2015 echo EF 20-25%, G2DD -Currently with persistent hypotension and poor oral intake -Judicious IV fluids  9)Hypernatremia/dehydration -Resolving with IV fluids, -due to continued poor po intake--D5 half-normal as ordered  10)Alzheimer's dementia/ambulatory dysfunction -Continue Namenda -spouse states pt screamsfrequently at home -At baseline patient has very advanced dementia with severe cognitive and memory deficits, she has been bed-bound for the last 3 months at least, requiring total care  11)Coronary artery disease -hx of CABG in 2009 (LIMA-LAD, SVG-ramus, SVG-OM).  Hx of RCA stent 04/2012 -no chest pain presently Discontinued Aspirin and Plavix due to rectal bleeding with symptomatic Anemia -Continue statin -Coreg on hold due to hypotension  12)Social/Ethics--- overall prognosis is poor, patient is a DNR/DNI -Plan of care, goals of care discussed with patient's husband at bedside, I also called and discussed case with patient's son [name] at [phone]--- questions answered -Official palliative care consult appreciated  13)Ventricular Tachycardia/H/o Torsades -status post AICD placement - she has CRT-D device followed by EP - she underwent ICD upgrade of single chamber ICD to [facility] CRT-D  CRITICAL CARE Performed by: Roxan Hockey   Total critical care time: 42 minutes  Critical care time was exclusive of separately billable procedures and treating other patients.  Critical care was necessary to treat or prevent imminent or life-threatening deterioration.  - hypothermia resolved with warming blanket and antibiotics/fluids  BP improving with IV Levophed/pressure support--weaning off Levophed -Overall patient with severe sepsis with septic shock -Remains critically ill in ICU  Critical care was time spent personally by me on the following activities: development of treatment plan with patient and/or surrogate as well as nursing, discussions with consultants, evaluation of patient's response to treatment, examination of patient, obtaining history from patient or surrogate, ordering and performing treatments and interventions, ordering and review of laboratory studies, ordering and review of radiographic studies, pulse oximetry and re-evaluation of patient's condition.    Disposition Plan: Patient From: Home D/C Place:SNF when medically stable Barriers: Not Clinically Stable--poor po intake, low BPs requiring pressors due to septic shock, requiring IV antibiotics and IV fluids  -Not medically stable for discharge at this  time  Code Status : DNR  Procedures-- bedside decubitus ulcer debridement on 10/17/2019 -Right arm PICC line placement on 10/21/2019 -transfusion of 2 units of PRBC on 10/21/2019 -Bedside decubitus debridement by Dr. Constance Haw on 10/22/2019  Family Communication:   Spouse updatedat bedside - I also called and discussed case with patient's son [name] at [phone]--- questions answered  Consults  : Palliative care and general surgery  DVT Prophylaxis  :  Teds and SCDs/rectal bleeding   Lab Results  Component Value Date   PLT 117 (L) 10/22/2019    Inpatient Medications  Scheduled Meds: . sodium chloride   Intravenous Once  . sodium chloride   Intravenous Once  . sodium chloride   Intravenous Once  . atorvastatin  80 mg Oral Daily  . Chlorhexidine Gluconate Cloth  6 each Topical Daily  . collagenase   Topical BID  . feeding supplement (ENSURE ENLIVE)  237 mL Oral TID  . folic acid  1 mg Oral Daily  . hydrocortisone  25 mg Rectal BID  . lactulose  30 g Oral Once  . levothyroxine  37.5 mcg Intravenous Q0600  . mouth rinse  15 mL Mouth Rinse BID  . memantine  10 mg Oral BID  . pantoprazole (PROTONIX) IV  40 mg Intravenous Q12H  . polyethylene glycol  17 g Oral Daily  . sodium chloride flush  10-40 mL Intracatheter Q12H  . sodium chloride flush  3 mL Intravenous Q12H  . vitamin B-12  500 mcg Oral Daily   Continuous Infusions: . sodium chloride 10 mL/hr at 10/21/19 2018  . cefTRIAXone (ROCEPHIN)  IV    . dextrose 5 % and 0.45% NaCl 50 mL/hr at 10/22/19 1613  . metronidazole 500 mg (10/22/19 1619)  . norepinephrine (LEVOPHED) Adult infusion Stopped (10/22/19 1330)   PRN Meds:.sodium chloride, acetaminophen **OR** acetaminophen, fentaNYL (SUBLIMAZE) injection, HYDROcodone-acetaminophen, LORazepam, ondansetron (ZOFRAN) IV, sodium chloride flush, sodium chloride flush   Anti-infectives (From admission, onward)   Start     Dose/Rate Route Frequency Ordered Stop    10/22/19 2200  cefTRIAXone (ROCEPHIN) 2 g in sodium chloride 0.9 % 100 mL IVPB     2 g 200 mL/hr over 30 Minutes Intravenous Every 24 hours 10/22/19 1022     10/17/19 2359  vancomycin (VANCOREADY) IVPB 1250 mg/250 mL     1,250 mg 166.7 mL/hr over 90 Minutes Intravenous Every 48 hours 10/08/2019 2022 10/22/19 0230   10/31/2019 2359  metroNIDAZOLE (FLAGYL) IVPB 500 mg     500 mg 100 mL/hr over 60 Minutes Intravenous Every 8 hours 10/14/2019 2047     10/20/2019 2359  ceFEPIme (MAXIPIME) 2 g in sodium chloride 0.9 % 100 mL IVPB  Status:  Discontinued     2 g 200 mL/hr over 30 Minutes Intravenous Every 24 hours 10/12/2019 2012 10/22/19 1022   10/28/2019 1430  vancomycin (VANCOCIN) IVPB 1000 mg/200 mL premix     1,000 mg 200 mL/hr over 60 Minutes Intravenous  Once 10/18/2019 1428 10/08/2019 1713   11/01/2019 1430  piperacillin-tazobactam (ZOSYN) IVPB 3.375 g     3.375 g 100 mL/hr over 30 Minutes Intravenous  Once 10/17/2019 1428 10/27/2019 1607        Objective:   Vitals:   10/22/19 1445 10/22/19 1500 10/22/19 1530 10/22/19 1658  BP: 93/83 (!) 119/59 (!) 109/52   Pulse: 68 65 71   Resp: 11     Temp:      TempSrc:    (P) Oral  SpO2:  100%  100%   Weight:      Height:        Wt Readings from Last 3 Encounters:  10/22/19 80.2 kg  06/13/19 78.5 kg  10/19/18 73.6 kg    Intake/Output Summary (Last 24 hours) at 10/22/2019 1749 Last data filed at 10/22/2019 1518 Gross per 24 hour  Intake 3180.78 ml  Output 250 ml  Net 2930.78 ml   Physical Exam  Gen:-Lethargic and incoherent at times  HEENT:- [place].AT, No sclera icterus Neck-Supple Neck,No JVD,.  Lungs-diminished in bases, no wheezing CV- S1, S2 normal, regular, CABG scar, AICD in situ Abd-  +ve B.Sounds, Abd Soft, No tenderness,    Extremity-Some upper extremity edema, pedal pulses present  Psych-severe cognitive and memory deficits  neuro-generalized weakness, no new focal deficits, no tremors MSK-right arm PICC line  Skin---  sacral and  bilateral hip decubitus ulcers, please see photos in epic    Data Review:   Micro Results Recent Results (from the past 240 hour(s))  Wound or Superficial Culture     Status: None   Collection Time: 10/24/2019  2:28 PM   Specimen: Wound  Result Value Ref Range Status   Specimen Description   Final    WOUND RIGHT ILIAC Performed at [facility], [address] [place] [address] [place], [place] [address]    Special Requests   Final    Normal Performed at [place] [place], [place] [address]    Gram Stain   Final    RARE WBC PRESENT, PREDOMINANTLY PMN ABUNDANT GRAM NEGATIVE RODS FEW GRAM POSITIVE COCCI RARE GRAM POSITIVE RODS Performed at [facility], [place] [address], [place], [place] [address]    Culture   Final    ABUNDANT ESCHERICHIA COLI ABUNDANT PROTEUS MIRABILIS    Report Status 10/20/2019 FINAL  Final   Organism ID, Bacteria ESCHERICHIA COLI  Final   Organism ID, Bacteria PROTEUS MIRABILIS  Final      Susceptibility   Escherichia coli - MIC*    AMPICILLIN >=32 RESISTANT Resistant     CEFAZOLIN >=64 RESISTANT Resistant     CEFEPIME <=0.12 SENSITIVE Sensitive     CEFTAZIDIME <=1 SENSITIVE Sensitive     CEFTRIAXONE <=0.25 SENSITIVE Sensitive     CIPROFLOXACIN <=0.25 SENSITIVE Sensitive     GENTAMICIN <=1 SENSITIVE Sensitive     IMIPENEM <=0.25 SENSITIVE Sensitive     TRIMETH/SULFA <=20 SENSITIVE Sensitive     AMPICILLIN/SULBACTAM >=32 RESISTANT Resistant     PIP/TAZO <=4 SENSITIVE Sensitive     * ABUNDANT ESCHERICHIA COLI   Proteus mirabilis - MIC*    AMPICILLIN <=2 SENSITIVE Sensitive     CEFAZOLIN <=4 SENSITIVE Sensitive     CEFEPIME <=0.12 SENSITIVE Sensitive     CEFTAZIDIME <=1 SENSITIVE Sensitive     CEFTRIAXONE <=0.25 SENSITIVE Sensitive     CIPROFLOXACIN <=0.25 SENSITIVE Sensitive     GENTAMICIN <=1 SENSITIVE Sensitive     IMIPENEM 4 SENSITIVE Sensitive     TRIMETH/SULFA <=20 SENSITIVE Sensitive     AMPICILLIN/SULBACTAM <=2 SENSITIVE Sensitive      PIP/TAZO <=4 SENSITIVE Sensitive     * ABUNDANT PROTEUS MIRABILIS  Blood Culture (routine x 2)     Status: Abnormal   Collection Time: 11/05/2019  3:15 PM   Specimen: BLOOD  Result Value Ref Range Status   Specimen Description   Final    BLOOD RIGHT ANTECUBITAL Performed at [facility], [place] [address], [place], [place] [address]    Special  Requests   Final    BOTTLES DRAWN AEROBIC AND ANAEROBIC Blood Culture adequate volume Performed at [facility], [address], [place], [place] [address]    Culture  Setup Time   Final    GRAM NEGATIVE RODS Gram Stain Report Called to,Read Back By and Verified With: OLIVER,J ON 10/17/19 AT 1910 BY LOY,C PERFORMED AT [facility] ANAEROBIC BOTTLE GRAM POSITIVE COCCI AEROBIC BOTTLE ONLY Gram Stain Report Called to,Read Back By and Verified With: H EVANS,RN_0  10/18/19 MKELLY    Culture (A)  Final    STAPHYLOCOCCUS EPIDERMIDIS THE SIGNIFICANCE OF ISOLATING THIS ORGANISM FROM A SINGLE SET OF BLOOD CULTURES WHEN MULTIPLE SETS ARE DRAWN IS UNCERTAIN. PLEASE NOTIFY THE MICROBIOLOGY DEPARTMENT WITHIN ONE WEEK IF SPECIATION AND SENSITIVITIES ARE REQUIRED. BACTEROIDES FRAGILIS BETA LACTAMASE POSITIVE Performed at [facility], [place] [address] [place], [place] [address]    Report Status 10/19/2019 FINAL  Final  Blood Culture ID Panel (Reflexed)     Status: None   Collection Time: 10/12/2019  3:15 PM  Result Value Ref Range Status   Enterococcus species NOT DETECTED NOT DETECTED Final   Listeria monocytogenes NOT DETECTED NOT DETECTED Final   Staphylococcus species NOT DETECTED NOT DETECTED Final   Staphylococcus aureus (BCID) NOT DETECTED NOT DETECTED Final   Streptococcus species NOT DETECTED NOT DETECTED Final   Streptococcus agalactiae NOT DETECTED NOT DETECTED Final   Streptococcus pneumoniae NOT DETECTED NOT DETECTED Final   Streptococcus pyogenes NOT DETECTED NOT DETECTED Final   Acinetobacter baumannii NOT DETECTED NOT DETECTED Final    Enterobacteriaceae species NOT DETECTED NOT DETECTED Final   Enterobacter cloacae complex NOT DETECTED NOT DETECTED Final   Escherichia coli NOT DETECTED NOT DETECTED Final   Klebsiella oxytoca NOT DETECTED NOT DETECTED Final   Klebsiella pneumoniae NOT DETECTED NOT DETECTED Final   Proteus species NOT DETECTED NOT DETECTED Final   Serratia marcescens NOT DETECTED NOT DETECTED Final   Haemophilus influenzae NOT DETECTED NOT DETECTED Final   Neisseria meningitidis NOT DETECTED NOT DETECTED Final   Pseudomonas aeruginosa NOT DETECTED NOT DETECTED Final   Candida albicans NOT DETECTED NOT DETECTED Final   Candida glabrata NOT DETECTED NOT DETECTED Final   Candida krusei NOT DETECTED NOT DETECTED Final   Candida parapsilosis NOT DETECTED NOT DETECTED Final   Candida tropicalis NOT DETECTED NOT DETECTED Final    Comment: Performed at [facility], [place] [address] [place], [place] [address]  Respiratory Panel by RT PCR (Flu A&B, Covid) - Nasopharyngeal Swab     Status: None   Collection Time: 11/07/2019  5:00 PM   Specimen: Nasopharyngeal Swab  Result Value Ref Range Status   SARS Coronavirus 2 by RT PCR NEGATIVE NEGATIVE Final    Comment: (NOTE) SARS-CoV-2 target nucleic acids are NOT DETECTED. The SARS-CoV-2 RNA is generally detectable in upper respiratoy specimens during the acute phase of infection. The lowest concentration of SARS-CoV-2 viral copies this assay can detect is 131 copies/mL. A negative result does not preclude SARS-Cov-2 infection and should not be used as the sole basis for treatment or other patient management decisions. A negative result may occur with  improper specimen collection/handling, submission of specimen other than nasopharyngeal swab, presence of viral mutation(s) within the areas targeted by this assay, and inadequate number of viral copies (<131 copies/mL). A negative result must be combined with clinical observations, patient history, and  epidemiological information. The expected result is Negative. Fact Sheet for Patients:  PinkCheek.be Fact Sheet for Healthcare  Providers:  GravelBags.it This test is not yet ap proved or cleared by the [place] and  has been authorized for detection and/or diagnosis of SARS-CoV-2 by FDA under an Emergency Use Authorization (EUA). This EUA will remain  in effect (meaning this test can be used) for the duration of the COVID-19 declaration under Section 564(b)(1) of the Act, 21 U.S.C. section 360bbb-3(b)(1), unless the authorization is terminated or revoked sooner.    Influenza A by PCR NEGATIVE NEGATIVE Final   Influenza B by PCR NEGATIVE NEGATIVE Final    Comment: (NOTE) The Xpert Xpress SARS-CoV-2/FLU/RSV assay is intended as an aid in  the diagnosis of influenza from Nasopharyngeal swab specimens and  should not be used as a sole basis for treatment. Nasal washings and  aspirates are unacceptable for Xpert Xpress SARS-CoV-2/FLU/RSV  testing. Fact Sheet for Patients: PinkCheek.be Fact Sheet for Healthcare Providers: GravelBags.it This test is not yet approved or cleared by the [place] FDA and  has been authorized for detection and/or diagnosis of SARS-CoV-2 by  FDA under an Emergency Use Authorization (EUA). This EUA will remain  in effect (meaning this test can be used) for the duration of the  Covid-19 declaration under Section 564(b)(1) of the Act, 21  U.S.C. section 360bbb-3(b)(1), unless the authorization is  terminated or revoked. Performed at [facility], [address], [place], [place] [address]   Blood Culture (routine x 2)     Status: None   Collection Time: 11/02/2019  5:13 PM   Specimen: Left Antecubital; Blood  Result Value Ref Range Status   Specimen Description LEFT ANTECUBITAL  Final   Special Requests   Final    BOTTLES DRAWN AEROBIC  ONLY Blood Culture results may not be optimal due to an inadequate volume of blood received in culture bottles   Culture   Final    NO GROWTH 5 DAYS Performed at [facility], [address], [place], [place] [address]    Report Status 10/21/2019 FINAL  Final  Urine culture     Status: Abnormal   Collection Time: 10/17/19  2:26 PM   Specimen: In/Out Cath Urine  Result Value Ref Range Status   Specimen Description   Final    IN/OUT CATH URINE Performed at [facility], [address] [place], [place] [address]    Special Requests   Final    NONE Performed at [facility], [address], [place], [place] [address]    Culture 20,000 COLONIES/mL KLEBSIELLA PNEUMONIAE (A)  Final   Report Status 10/20/2019 FINAL  Final   Organism ID, Bacteria KLEBSIELLA PNEUMONIAE (A)  Final      Susceptibility   Klebsiella pneumoniae - MIC*    AMPICILLIN >=32 RESISTANT Resistant     CEFAZOLIN <=4 SENSITIVE Sensitive     CEFTRIAXONE <=0.25 SENSITIVE Sensitive     CIPROFLOXACIN <=0.25 SENSITIVE Sensitive     GENTAMICIN <=1 SENSITIVE Sensitive     IMIPENEM 1 SENSITIVE Sensitive     NITROFURANTOIN 64 INTERMEDIATE Intermediate     TRIMETH/SULFA <=20 SENSITIVE Sensitive     AMPICILLIN/SULBACTAM 8 SENSITIVE Sensitive     PIP/TAZO <=4 SENSITIVE Sensitive     * 20,000 COLONIES/mL KLEBSIELLA PNEUMONIAE  Aerobic/Anaerobic Culture (surgical/deep wound)     Status: None   Collection Time: 10/17/19  6:14 PM   Specimen: Abscess  Result Value Ref Range Status   Specimen Description   Final    ABSCESS Performed at [facility], [address] [place], [place], [place]  [address]    Special Requests   Final    RIGHT Performed at [facility], [address] [address], [place], [place] [address]    Gram Stain NO WBC SEEN NO ORGANISMS SEEN   Final   Culture   Final    FEW ESCHERICHIA COLI FEW PROTEUS MIRABILIS MODERATE BACTEROIDES FRAGILIS BETA LACTAMASE POSITIVE Performed at [facility], [place] [address]  [place], [place] [address]    Report Status 10/22/2019 FINAL  Final   Organism ID, Bacteria ESCHERICHIA COLI  Final   Organism ID, Bacteria PROTEUS MIRABILIS  Final      Susceptibility   Escherichia coli - MIC*    AMPICILLIN >=32 RESISTANT Resistant     CEFAZOLIN 16 SENSITIVE Sensitive     CEFEPIME <=0.12 SENSITIVE Sensitive     CEFTAZIDIME <=1 SENSITIVE Sensitive     CEFTRIAXONE <=0.25 SENSITIVE Sensitive     CIPROFLOXACIN <=0.25 SENSITIVE Sensitive     GENTAMICIN <=1 SENSITIVE Sensitive     IMIPENEM <=0.25 SENSITIVE Sensitive     TRIMETH/SULFA <=20 SENSITIVE Sensitive     AMPICILLIN/SULBACTAM >=32 RESISTANT Resistant     PIP/TAZO <=4 SENSITIVE Sensitive     * FEW ESCHERICHIA COLI   Proteus mirabilis - MIC*    AMPICILLIN <=2 SENSITIVE Sensitive     CEFAZOLIN <=4 SENSITIVE Sensitive     CEFEPIME <=0.12 SENSITIVE Sensitive     CEFTAZIDIME <=1 SENSITIVE Sensitive     CEFTRIAXONE <=0.25 SENSITIVE Sensitive     CIPROFLOXACIN <=0.25 SENSITIVE Sensitive     GENTAMICIN <=1 SENSITIVE Sensitive     IMIPENEM 4 SENSITIVE Sensitive     TRIMETH/SULFA <=20 SENSITIVE Sensitive     AMPICILLIN/SULBACTAM <=2 SENSITIVE Sensitive     PIP/TAZO <=4 SENSITIVE Sensitive     * FEW PROTEUS MIRABILIS  Aerobic/Anaerobic Culture (surgical/deep wound)     Status: Abnormal   Collection Time: 10/17/19  6:14 PM   Specimen: Abscess  Result Value Ref Range Status   Specimen Description   Final    ABSCESS Performed at [facility], [address] [place], [place] [address]    Special Requests   Final    LEFT Performed at [facility], [address] [place], [place] [address]    Gram Stain   Final    FEW WBC PRESENT,BOTH PMN AND MONONUCLEAR RARE GRAM VARIABLE ROD RARE GRAM POSITIVE COCCI RARE YEAST    Culture (A)  Final    MULTIPLE ORGANISMS PRESENT, NONE PREDOMINANT FEW CANDIDA LUSITANIAE NO STAPHYLOCOCCUS AUREUS ISOLATED NO GROUP A STREP (S.PYOGENES) ISOLATED MODERATE BACTEROIDES  FRAGILIS POSITIVE Performed at [facility], [place] [address] [place], [place] [address]    Report Status 10/22/2019 FINAL  Final  MRSA PCR Screening     Status: None   Collection Time: 10/20/19  4:57 PM   Specimen: Nasal Mucosa; Nasopharyngeal  Result Value Ref Range Status   MRSA by PCR NEGATIVE NEGATIVE Final    Comment:        The GeneXpert MRSA Assay (FDA approved for NASAL specimens only), is one component of a comprehensive MRSA colonization surveillance program. It is not intended to diagnose MRSA infection nor to guide or monitor treatment for MRSA infections. Performed at [facility], [address], [place],  [address]   Culture, blood (Routine X 2) w Reflex to ID Panel     Status: None (Preliminary result)   Collection Time: 10/21/19  6:10 PM   Specimen: A-Line; Blood  Result Value Ref Range Status  Specimen Description A-LINE  Final   Special Requests   Final    BOTTLES DRAWN AEROBIC AND ANAEROBIC Blood Culture adequate volume   Culture   Final    NO GROWTH < 24 HOURS Performed at [facility], [address], [place], [place] [address]    Report Status PENDING  Incomplete  Culture, blood (Routine X 2) w Reflex to ID Panel     Status: None (Preliminary result)   Collection Time: 10/22/19  4:34 AM   Specimen: BLOOD  Result Value Ref Range Status   Specimen Description BLOOD SITE NOT SPECIFIED  Final   Special Requests   Final    BOTTLES DRAWN AEROBIC AND ANAEROBIC Blood Culture adequate volume   Culture   Final    NO GROWTH < 12 HOURS Performed at [facility], [address], [place], [place] [address]    Report Status PENDING  Incomplete    Radiology Reports CT ABDOMEN PELVIS WO CONTRAST  Result Date: 10/14/2019 CLINICAL DATA:  Decubitus ulcers EXAM: CT ABDOMEN AND PELVIS WITHOUT CONTRAST TECHNIQUE: Multidetector CT imaging of the abdomen and pelvis was performed following the standard protocol without IV contrast. COMPARISON:  None.  FINDINGS: Lower chest: No acute abnormality. Hepatobiliary: Scattered calcifications are noted within the liver. Gallbladder is well distended with multiple dependent gallstones. Pancreas: Unremarkable. No pancreatic ductal dilatation or surrounding inflammatory changes. Spleen: Normal in size without focal abnormality. Adrenals/Urinary Tract: Adrenal glands are within normal limits. Kidneys are well visualized bilaterally with renal vascular calcifications. No renal stones are seen. No obstructive changes are noted. The bladder is partially distended. Stomach/Bowel: The appendix is not well visualized although no inflammatory changes to suggest appendicitis are noted. Retained fecal material is noted throughout the colon consistent with a mild degree of constipation without obstructive change. No small bowel abnormality is seen. Stomach is unremarkable. Vascular/Lymphatic: Aortic atherosclerosis. No enlarged abdominal or pelvic lymph nodes. Reproductive: Uterus and bilateral adnexa are unremarkable. Other: No abdominal wall hernia or abnormality. No abdominopelvic ascites. Musculoskeletal: Mild changes of anasarca are noted. Decubitus ulcer is noted laterally near the right hip with inflammatory changes extending from the skin towards the greater trochanter. This area measures approximately 7.6 x 4.3 cm. No definitive bony erosive changes to suggest osteomyelitis are noted at this time. No other definitive decubitus ulcer is seen. Degenerative changes of lumbar spine are seen. IMPRESSION: Right lateral decubitus ulcer with subcutaneous inflammatory changes extending towards the greater trochanter of the right proximal femur. No definitive bony destructive changes are seen. Considerable subcutaneous inflammatory changes noted with air tracking towards the lateral aspect of the femur. Changes of mild constipation. Cholelithiasis without complicating factors. Electronically Signed   By: Inez Catalina M.D.   On:  11/04/2019 16:51   US RENAL  Result Date: 10/18/2019 CLINICAL DATA:  Acute renal insufficiency. EXAM: RENAL / URINARY TRACT ULTRASOUND COMPLETE COMPARISON:  10/18/2019 CT FINDINGS: Right Kidney: Renal measurements: 9.3 x 4.4 x 5.8 cm = volume: 123 mL. Increased renal echogenicity. No hydronephrosis. Left Kidney: Renal measurements: 9.6 x 5.2 x 4.6 cm = volume: 119 mL. Increased renal echogenicity. No hydronephrosis. Bladder: Decompressed. Other: Trace perinephric edema/fluid likely relates to renal insufficiency. Note is made of gallbladder sludge and stones including at 1.0 cm. IMPRESSION: 1. No hydronephrosis. Increased renal echogenicity, consistent with medical renal disease. 2. Cholelithiasis without acute cholecystitis. Electronically Signed   By: Abigail Miyamoto M.D.   On: 10/18/2019 11:51   DG Chest Port 1 View  Result Date: 10/24/2019  CLINICAL DATA:  Sepsis EXAM: PORTABLE CHEST 1 VIEW COMPARISON:  11/05/2014 FINDINGS: Stable positioning of a left-sided implanted cardiac device. Post CABG changes. Stable cardiomediastinal contours. No focal airspace consolidation, pleural effusion, or pneumothorax. IMPRESSION: No acute cardiopulmonary findings. Electronically Signed   By: Davina Poke D.O.   On: 10/15/2019 14:47   [place] EKG SITE RITE  Result Date: 10/20/2019 If Site Rite image not attached, placement could not be confirmed due to current cardiac rhythm.    CBC Recent Labs  Lab 10/08/2019 1515 10/17/19 0607 10/18/19 0611 10/19/19 0500 10/20/19 0709 10/21/19 1633 10/22/19 0949  WBC 21.7*   < > 24.5* 19.5* 22.1* 19.8* 12.5*  HGB 8.6*   < > 8.2* 8.2* 7.9* 6.9* 10.3*  HCT 28.9*   < > 28.6* 29.0* 27.3* 22.6* 32.6*  PLT 299   < > 226 197 206 188 117*  MCV 100.3*   < > 102.5* 103.6* 100.7* 97.0 93.4  MCH 29.9   < > 29.4 29.3 29.2 29.6 29.5  MCHC 29.8*   < > 28.7* 28.3* 28.9* 30.5 31.6  RDW 14.4   < > 14.6 14.8 15.2 15.6* 16.1*  LYMPHSABS 0.8  --   --   --   --   --   --   MONOABS 0.7  --    --   --   --   --   --   EOSABS 0.0  --   --   --   --   --   --   BASOSABS 0.0  --   --   --   --   --   --    < > = values in this interval not displayed.    Chemistries  Recent Labs  Lab 10/14/2019 1515 11/06/2019 1740 10/17/19 0607 10/18/19 0611 10/19/19 0500 10/20/19 0709 10/21/19 1633 10/22/19 0949  NA 147*  --    < > 142 138 134* 137 138  K 4.4  --    < > 4.1 4.3 4.2 4.1 3.8  CL 112*  --    < > 112* 111 107 112* 111  CO2 24  --    < > 20* 15* 17* 17* 15*  GLUCOSE 195*  --    < > 188* 196* 113* 125* 141*  BUN 54*  --    < > 56* 60* 60* 57* 57*  CREATININE 1.83*  --    < > 1.81* 1.74* 1.82* 1.72* 1.68*  CALCIUM 7.9*  --    < > 7.1* 6.7* 6.7* 6.7* 6.8*  MG  --  2.4  --  2.3  --   --  1.9  --   AST 20  --   --   --   --   --   --  19  ALT 13  --   --   --   --   --   --  13  ALKPHOS 81  --   --   --   --   --   --  85  BILITOT 0.8  --   --   --   --   --   --  1.0   < > = values in this interval not displayed.   ------------------------------------------------------------------------------------------------------------------ No results for input(s): CHOL, HDL, LDLCALC, TRIG, CHOLHDL, LDLDIRECT in the last 72 hours.  Lab Results  Component Value Date   HGBA1C 5.5 10/19/2019   ------------------------------------------------------------------------------------------------------------------ No results for input(s): TSH, T4TOTAL, T3FREE, THYROIDAB in  the last 72 hours.  Invalid input(s): FREET3 ------------------------------------------------------------------------------------------------------------------ No results for input(s): VITAMINB12, FOLATE, FERRITIN, TIBC, IRON, RETICCTPCT in the last 72 hours.  Coagulation profile Recent Labs  Lab 11/01/2019 1515  INR 1.1    No results for input(s): DDIMER in the last 72 hours.  Cardiac Enzymes No results for input(s): CKMB, TROPONINI, MYOGLOBIN in the last 168 hours.  Invalid input(s):  CK ------------------------------------------------------------------------------------------------------------------    Component Value Date/Time   BNP 583.7 (H) 11/01/2014 9449     Roxan Hockey M.D on 10/22/2019 at 5:49 PM  Go to [email or web address] - for contact info  [facility] - Office  [phone]

## 2019-10-23 DIAGNOSIS — L89214 Pressure ulcer of right hip, stage 4: Secondary | ICD-10-CM

## 2019-10-23 LAB — TYPE AND SCREEN
ABO/RH(D): B POS
Antibody Screen: NEGATIVE
Unit division: 0
Unit division: 0

## 2019-10-23 LAB — BASIC METABOLIC PANEL
Anion gap: 10 (ref 5–15)
BUN: 56 mg/dL — ABNORMAL HIGH (ref 8–23)
CO2: 16 mmol/L — ABNORMAL LOW (ref 22–32)
Calcium: 6.8 mg/dL — ABNORMAL LOW (ref 8.9–10.3)
Chloride: 113 mmol/L — ABNORMAL HIGH (ref 98–111)
Creatinine, Ser: 1.64 mg/dL — ABNORMAL HIGH (ref 0.44–1.00)
GFR calc Af Amer: 33 mL/min — ABNORMAL LOW (ref >60)
GFR calc non Af Amer: 29 mL/min — ABNORMAL LOW (ref >60)
Glucose, Bld: 133 mg/dL — ABNORMAL HIGH (ref 70–99)
Potassium: 3.6 mmol/L (ref 3.5–5.1)
Sodium: 139 mmol/L (ref 135–145)

## 2019-10-23 LAB — CBC
HCT: 31.6 % — ABNORMAL LOW (ref 36.0–46.0)
Hemoglobin: 10.1 g/dL — ABNORMAL LOW (ref 12.0–15.0)
MCH: 30 pg (ref 26.0–34.0)
MCHC: 32 g/dL (ref 30.0–36.0)
MCV: 93.8 fL (ref 80.0–100.0)
Platelets: 74 10*3/uL — ABNORMAL LOW (ref 150–400)
RBC: 3.37 MIL/uL — ABNORMAL LOW (ref 3.87–5.11)
RDW: 16.6 % — ABNORMAL HIGH (ref 11.5–15.5)
WBC: 9.2 10*3/uL (ref 4.0–10.5)
nRBC: 0 % (ref 0.0–0.2)

## 2019-10-23 LAB — BPAM RBC
Blood Product Expiration Date: 202104212359
Blood Product Expiration Date: 202104212359
ISSUE DATE / TIME: 202103142113
ISSUE DATE / TIME: 202103150023
Unit Type and Rh: 5100
Unit Type and Rh: 5100

## 2019-10-23 LAB — GLUCOSE, CAPILLARY
Glucose-Capillary: 124 mg/dL — ABNORMAL HIGH (ref 70–99)
Glucose-Capillary: 128 mg/dL — ABNORMAL HIGH (ref 70–99)
Glucose-Capillary: 119 mg/dL — ABNORMAL HIGH (ref 70–99)
Glucose-Capillary: 136 mg/dL — ABNORMAL HIGH (ref 70–99)

## 2019-10-23 LAB — CREATININE, SERUM
Creatinine, Ser: 1.64 mg/dL — ABNORMAL HIGH (ref 0.44–1.00)
GFR calc Af Amer: 33 mL/min — ABNORMAL LOW (ref >60)
GFR calc non Af Amer: 29 mL/min — ABNORMAL LOW (ref >60)

## 2019-10-23 MED ORDER — FLUCONAZOLE IN SODIUM CHLORIDE 400-0.9 MG/200ML-% IV SOLN
400.0000 mg | Freq: Once | INTRAVENOUS | Status: AC
  Administered 2019-10-23: 16:00:00 400 mg via INTRAVENOUS
  Filled 2019-10-23: qty 200

## 2019-10-23 MED ORDER — FLUCONAZOLE IN SODIUM CHLORIDE 200-0.9 MG/100ML-% IV SOLN
200.0000 mg | INTRAVENOUS | Status: DC
  Administered 2019-10-24: 200 mg via INTRAVENOUS
  Filled 2019-10-23 (×2): qty 100

## 2019-10-23 NOTE — Progress Notes (Signed)
Pharmacy Antibiotic Note  [name] is a 85 y.o. female admitted on 11/03/2019 with decubitus ulcer/absces.  Pharmacy has been consulted for Diflucan dosing. QTC calculated today on monitor is 460  Plan: Fluconazole 400mg  IV loading dose, then 200mg  IV q24h Continue Ceftriaxone 2gm IV q24h Continue Flagyl 500mg  IV q8h F/U clinical progress F/U EKG  Height: 5\' 4"  (162.6 cm) Weight: 176 lb 12.9 oz (80.2 kg) IBW/kg (Calculated) : 54.7  Temp (24hrs), Avg:97.6 F (36.4 C), Min:97 F (36.1 C), Max:98 F (36.7 C)  Recent Labs  Lab 10/22/2019 1515 10/11/2019 1740 10/17/19 0607 10/18/19 0611 10/18/19 0644 10/19/19 0500 10/19/19 0500 10/20/19 0709 10/21/19 1633 10/22/19 0949 10/23/19 0417 10/23/19 0855  WBC 21.7*  --    < >   < >  --  19.5*  --  22.1* 19.8* 12.5*  --  9.2  CREATININE 1.83*  --    < >   < >  --  1.74*   < > 1.82* 1.72* 1.68* 1.64* 1.64*  LATICACIDVEN 3.1* 2.3*  --   --  2.3*  --   --   --   --   --   --   --    < > = values in this interval not displayed.    Estimated Creatinine Clearance: 27.1 mL/min (A) (by C-G formula based on SCr of 1.64 mg/dL (H)).    No Known Allergies  Antimicrobials this admission: 3/16 Fluconazole>> 3/15 Ceftriaxone >> 3/9 metronidazole >>  3/9 zosyn x 1 3/9 cefepime >> 3/15 3/9 vanc >>3/15  Dose adjustments this admission: prn  Microbiology results: 3/14 BCX: ngtd 3/15: BCX: ngtd 3/9 BC x2: 1.Staph epi(only 1 bottle->likely contaminant/  2. bacteroides fragilis 3/9 Wound cx: E. Coli s- cefepime, ceftriaxone R- amp                         Proteus mir pan sensitive 3/10 Wound Cx:  E. Coli, P. mirab--sensitive to to cefepime, ceftriaxone Candida lusitaniae, bacteroides fragilis 3/10 Urine Cx: 20K CFU/ml K. Pneum s- cefazolin 3/13 MRSA PCR: negative   Thank you for allowing pharmacy to be a part of this patient's care.  4/15, BS 5/9, 5/9 Clinical Pharmacist Pager [phone] 10/23/2019 12:46 PM

## 2019-10-23 NOTE — Care Management (Signed)
PASSR received, [id number];  start date:  10/23/2019  Expiration: 11/13/2019

## 2019-10-23 NOTE — Progress Notes (Addendum)
Spoke with nursing staff. No overt GI bleeding. Only with a few streaks of bright red blood on stool disimpacted from rectal vault this morning. States patient is less responsive today. Hemoglobin remains stable today. Palliative is on board. Not a candidate for any procedures at this time. Discussed with Dr. Cheryll Dessert. No need for GI to continue to see patient at this time. Hospitalists will recall GI if needed.    Will SIGN OFF. PLEASE CALL WITH QUESTIONS.

## 2019-10-23 NOTE — Care Management Important Message (Signed)
Important Message  Patient Details  Name: [name] MRN: [id number] Date of Birth: June 07, 1935   Medicare Important Message Given:  Yes     Corey Harold 10/23/2019, 3:47 PM

## 2019-10-23 NOTE — Progress Notes (Signed)
Palliative: [name] is resting quietly in bed.  She does not interact with me in any meaningful way, she is unable to make her basic needs known.  There is no family at bedside at this time.  Call to spouse, [name] at [phone] to set up a family meeting.   Family meeting set for 3/17 at 2:30 PM.  I share that [name] and 1 child can come to the room to visit, and then we will hold the family meeting in the Chapel.  Conference with attending, bedside nursing staff, [facility] related to patient condition, needs, goals of care, family meeting.  Plan:    Family meeting 3/17 at 2:30 PM in the [place].  25 minutes Kelly Carmel, NP Palliative Medicine Team Team Phone # [phone] Greater than 50% of this time was spent counseling and coordinating care related to the above assessment and plan.

## 2019-10-23 NOTE — Progress Notes (Signed)
Patient Demographics:    [name], is a 85 y.o. female, DOB - [birth year], ZOX:[id number]  Admit date - 10/09/2019   Admitting Physician Ejiroghene Arlyce Dice, MD  Outpatient Primary MD for the patient is [name], MD  LOS - 7   Chief Complaint  Patient presents with  . Skin Ulcer        Subjective:    [name] today has no fevers, no emesis,  No chest pain,    -Patient is resting comfortably, she continues to refuse oral intake, husband at bedside, husband would like to wait until further conversation with his family to decide on possible NG tube/Keofeed Tube placement  Assessment  & Plan :    Principal Problem:   Severe sepsis with septic shock - Klebsiella/Proteus/E. coli/Bacteroides and Staph  Active Problems:   S/P ICD (internal cardiac defibrillator) procedure, with upgrade to ICD/CRT-D  [employer]   Cardiomyopathy, ischemic EF 20 to 25 %/history of combined systolic and diastolic dysfunction CHF   Decubitus ulcer of right hip, stage 4 /Infected with E-coli and Proteus   Failure to thrive in adult   Decubitus ulcer of left hip, stage 3/Infected with E-coli and Proteus   Goals of care, counseling/discussion   Hematochezia   Sepsis secondary to Klebsiella UTI ([place])   Anemia due to acute blood loss/rectal bleeding   Type II diabetes mellitus ([place])   AKI (acute kidney injury) ([place])   Hypertension   Mobitz type 2 second degree atrioventricular block   Polymorphic ventricular tachycardia (HCC)   Sepsis ([place])   Stage I pressure ulcer of sacral region   Bacteremia due to Gram-negative bacteria   Gram negative sepsis ([place])   Palliative care by specialist   Encounter for hospice care discussion   Brief summary 85 year old female with a history of advanced Alzheimer's dementia, CAD, H/o combined systolic and diastolic dysfunction CHF/HFrEF/ischemic cardiomyopathy with EF 20 to 25%,  h/o torsades VT status post AICD, H/o HTN, DM2, HLD, admitted on 10/18/2019 from home with metabolic encephalopathy in the setting of presumed infection (leukocytosis and Lactic acid was 3.1) with multiple decubitus ulcers --Able to come off warming blanket on 10/22/19 -Weaned off IV Levophed on 10/22/19   Assessment/Plan: 1)Severe sepsis with septic shock-- POA -Etiology is multifactorial:-Patient has Klebsiella UTI (urine cx 10/17/19), E. coli and Proteus wound decubitus ulcer/wound infection (wound cx from 10/15/2019 and 10/17/19) and staph epi and Bacteroides bacteremia from blood culture dated 10/13/2019 --Able to come off warming blanket on 10/22/19 -Weaned off IV Levophed on 10/22/19 --Stopped vancomycin after 10/21/2019 -MRSA PCR negative on 10/20/2019 -De-escalate to IV Rocephin and metronidazole on 10/22/2019 Stopped cefepime -Repeat blood culture from 10/21/2019 NGTD -Patient remains encephalopathic, at baseline she does have advanced dementia however with cognitive and memory deficits -WBC is down to 9.2 from a peak of 25.6 -PCT 2.91, ESR 60, CRP 14.1 -Patient remains critically ill, overall prognosis remains poor  2)Bacteremia--B.fragilis and staph epi--- ???  Significance of blood culture from 10/24/2019 -??  Contaminant, however patient is quite septic --Sepsis pathophysiology and Antibiotics as above #1 -Repeat blood cultures on 10/21/2019 NGTD   3)Infected Bilateral Hip and Sacral decubitus ulcers--please see photos in epic -10/20/2019 CT abdomen/pelvis--right lateral decubitus ulcer with subcutaneous inflammatory changes extending  toward the greater trochanter. There is no bony destructive changes. There is considerable inflammatory change with air tracking toward the lateral aspect of the femur. -General surgery consultappreciated -10/17/19--bedside debridement -Wound cultures from 10/08/2019 and 10/17/2019 with E. coli and Proteus --Wound culture with fungus reluctant to use Diflucan  due to prolonged QT concerns -Sepsis pathophysiology and Antibiotics as above #1  4) Klebsiella UTI--urine culture from 10/17/2019 noted --Sepsis pathophysiology and Antibiotics as above #1  5)Failure to thrive -Serum B12--199-->supplement -TSH--37.364, Free T4--0.28 -Folate--7.2 -PT evaluation-->SNF -continues to have poor po intake -Cognitive status and encephalopathy makes it difficult to feed orally -Severe sepsis with septic shock precludes PEG tube placement/endoscopy -Continue judicious IV dextrose fluids given that patient's EF is down to 20 to 25% -Ensure supplements refused by patient --Remains very challenging to feed patient as she closes her mouth , clenching her jaw and spits out anything -husband would like to wait until further conversation with his family to decide on possible NG tube/Keofeed Tube placement  6) symptomatic acute on chronic blood loss anemia due to rectal bleeding-hemoglobin is up  to 10.3 from 6.9 after transfusion of 2 units of PRBC on 10/21/2019 --- Hypothermia has resolved, hypotension resolved -No further rectal bleeding -Protonix for GI prophylaxis GI consult appreciated suspect rectal injury from fecal impaction--suppositories as ordered --Severe sepsis with septic shock and hemodynamic instability precludes endoluminal evaluation  7)CKD stage 3b -initially felt to be AKI but now more likely progression of CKD -pt likely has progression of underlying CKD -Creatinine hovering below 2 -No recent renal labs available to compare current values to -renal US-->medical renal disease - renally adjust medications, avoid nephrotoxic agents / dehydration  / hypotension  8)H/o combined systolic and diastolic dysfunction CHF/HFrEF/ischemic cardiomyopathy with EF 20 to 25%- -02/2015 echo EF 20-25%, G2DD -BP improving and poor oral intake -Judicious IV fluids  9)Hypernatremia/dehydration -Resolving with IV fluids, -due to continued poor po  intake--D5 half-normal as ordered  10)Alzheimer's dementia/ambulatory dysfunction -Continue Namenda -spouse states pt screamsfrequently at home -At baseline patient has very advanced dementia with severe cognitive and memory deficits, she has been bed-bound for the last 3 months at least, requiring total care  11)Coronary artery disease -hx of CABG in 2009 (LIMA-LAD, SVG-ramus, SVG-OM). Hx of RCA stent 04/2012 -no chest pain presently Discontinued Aspirin and Plavix due to rectal bleeding with symptomatic Anemia -Continue statin -Coreg on hold due to hypotension  12)Social/Ethics--- overall prognosis is poor, patient is a DNR/DNI -Plan of care, goals of care discussed with patient's husband at bedside, I also called and previously discussed case with patient's son [name] at [phone]--- questions answered -Official palliative care consult appreciated  13)Ventricular Tachycardia/H/o Torsades -status post AICD placement - she has CRT-D device followed by EP - she underwent ICD upgrade of single chamber ICD to [facility] CRT-D   Disposition Plan: Patient From: Home D/C Place:SNF when medically stable Barriers: Not Clinically Stable--poor po intake, low BPs requiring pressors due to septic shock, requiring IV antibiotics and IV fluids  -Not medically stable for discharge at this time  Code Status : DNR  Procedures-- bedside decubitus ulcer debridement on 10/17/2019 -Right arm PICC line placement on 10/21/2019 -transfusion of 2 units of PRBC on 10/21/2019 -Bedside decubitus debridement by Dr. Constance Haw on 10/22/2019  Family Communication:   Spouse updatedat bedside - I previously called and discussed case with patient's son [name] at [phone]--- questions answered  Consults  : Palliative care and general surgery  DVT Prophylaxis  :  Teds  and SCDs/rectal bleeding   Lab Results  Component Value Date   PLT 74 (L) 10/23/2019    Inpatient Medications  Scheduled  Meds: . sodium chloride   Intravenous Once  . sodium chloride   Intravenous Once  . sodium chloride   Intravenous Once  . atorvastatin  80 mg Oral Daily  . Chlorhexidine Gluconate Cloth  6 each Topical Daily  . collagenase   Topical BID  . feeding supplement (ENSURE ENLIVE)  237 mL Oral TID  . folic acid  1 mg Oral Daily  . hydrocortisone  25 mg Rectal BID  . lactulose  30 g Oral Once  . levothyroxine  37.5 mcg Intravenous Q0600  . mouth rinse  15 mL Mouth Rinse BID  . memantine  10 mg Oral BID  . pantoprazole (PROTONIX) IV  40 mg Intravenous Q12H  . polyethylene glycol  17 g Oral Daily  . sodium chloride flush  10-40 mL Intracatheter Q12H  . sodium chloride flush  3 mL Intravenous Q12H  . vitamin B-12  500 mcg Oral Daily   Continuous Infusions: . sodium chloride 10 mL/hr at 10/21/19 2018  . cefTRIAXone (ROCEPHIN)  IV 2 g (10/22/19 2228)  . dextrose 5 % and 0.45% NaCl 50 mL/hr at 10/23/19 1305  . fluconazole (DIFLUCAN) IV     Followed by  . [START ON 10/24/2019] fluconazole (DIFLUCAN) IV    . metronidazole 500 mg (10/23/19 0817)  . norepinephrine (LEVOPHED) Adult infusion Stopped (10/22/19 1330)   PRN Meds:.sodium chloride, acetaminophen **OR** acetaminophen, fentaNYL (SUBLIMAZE) injection, HYDROcodone-acetaminophen, LORazepam, ondansetron (ZOFRAN) IV, sodium chloride flush, sodium chloride flush   Anti-infectives (From admission, onward)   Start     Dose/Rate Route Frequency Ordered Stop   10/24/19 1400  fluconazole (DIFLUCAN) IVPB 200 mg     200 mg 100 mL/hr over 60 Minutes Intravenous Every 24 hours 10/23/19 1144     10/23/19 1400  fluconazole (DIFLUCAN) IVPB 400 mg     400 mg 100 mL/hr over 120 Minutes Intravenous  Once 10/23/19 1144     10/22/19 2200  cefTRIAXone (ROCEPHIN) 2 g in sodium chloride 0.9 % 100 mL IVPB     2 g 200 mL/hr over 30 Minutes Intravenous Every 24 hours 10/22/19 1022     10/17/19 2359  vancomycin (VANCOREADY) IVPB 1250 mg/250 mL     1,250  mg 166.7 mL/hr over 90 Minutes Intravenous Every 48 hours 11/05/2019 2022 10/22/19 2222   10/30/2019 2359  metroNIDAZOLE (FLAGYL) IVPB 500 mg     500 mg 100 mL/hr over 60 Minutes Intravenous Every 8 hours 11/05/2019 2047     10/29/2019 2359  ceFEPIme (MAXIPIME) 2 g in sodium chloride 0.9 % 100 mL IVPB  Status:  Discontinued     2 g 200 mL/hr over 30 Minutes Intravenous Every 24 hours 11/02/2019 2012 10/22/19 1022   10/26/2019 1430  vancomycin (VANCOCIN) IVPB 1000 mg/200 mL premix     1,000 mg 200 mL/hr over 60 Minutes Intravenous  Once 10/08/2019 1428 10/23/2019 1713   10/14/2019 1430  piperacillin-tazobactam (ZOSYN) IVPB 3.375 g     3.375 g 100 mL/hr over 30 Minutes Intravenous  Once 10/23/2019 1428 11/06/2019 1607        Objective:   Vitals:   10/23/19 1153 10/23/19 1200 10/23/19 1300 10/23/19 1400  BP:  (!) 114/54 (!) 119/50 (!) 103/51  Pulse:  (!) 54 62 (!) 54  Resp:  13 16 (!) 9  Temp: (!) 97 F (36.1 C)  TempSrc: Axillary     SpO2:  100% 100% 100%  Weight:      Height:        Wt Readings from Last 3 Encounters:  10/22/19 80.2 kg  06/13/19 78.5 kg  10/19/18 73.6 kg    Intake/Output Summary (Last 24 hours) at 10/23/2019 1456 Last data filed at 10/23/2019 1200 Gross per 24 hour  Intake 1859.62 ml  Output 150 ml  Net 1709.62 ml   Physical Exam  Gen:-Lethargic and incoherent at times  HEENT:- [place].AT, No sclera icterus Neck-Supple Neck,No JVD,.  Lungs-diminished in bases, no wheezing CV- S1, S2 normal, regular, CABG scar, AICD in situ Abd-  +ve B.Sounds, Abd Soft, No tenderness,    Extremity-Some upper extremity edema, pedal pulses present  Psych-severe cognitive and memory deficits  neuro-generalized weakness, no new focal deficits, no tremors MSK-right arm PICC line  Skin---  sacral and bilateral hip decubitus ulcers, please see photos in epic    Data Review:   Micro Results Recent Results (from the past 240 hour(s))  Wound or Superficial Culture     Status: None    Collection Time: 10/31/2019  2:28 PM   Specimen: Wound  Result Value Ref Range Status   Specimen Description   Final    WOUND RIGHT ILIAC Performed at [facility], [address] [place], [place] [address]    Special Requests   Final    Normal Performed at [place] [place], [place] [address]    Gram Stain   Final    RARE WBC PRESENT, PREDOMINANTLY PMN ABUNDANT GRAM NEGATIVE RODS FEW GRAM POSITIVE COCCI RARE GRAM POSITIVE RODS Performed at [facility], [place] [address], [place], [place] [address]    Culture   Final    ABUNDANT ESCHERICHIA COLI ABUNDANT PROTEUS MIRABILIS    Report Status 10/20/2019 FINAL  Final   Organism ID, Bacteria ESCHERICHIA COLI  Final   Organism ID, Bacteria PROTEUS MIRABILIS  Final      Susceptibility   Escherichia coli - MIC*    AMPICILLIN >=32 RESISTANT Resistant     CEFAZOLIN >=64 RESISTANT Resistant     CEFEPIME <=0.12 SENSITIVE Sensitive     CEFTAZIDIME <=1 SENSITIVE Sensitive     CEFTRIAXONE <=0.25 SENSITIVE Sensitive     CIPROFLOXACIN <=0.25 SENSITIVE Sensitive     GENTAMICIN <=1 SENSITIVE Sensitive     IMIPENEM <=0.25 SENSITIVE Sensitive     TRIMETH/SULFA <=20 SENSITIVE Sensitive     AMPICILLIN/SULBACTAM >=32 RESISTANT Resistant     PIP/TAZO <=4 SENSITIVE Sensitive     * ABUNDANT ESCHERICHIA COLI   Proteus mirabilis - MIC*    AMPICILLIN <=2 SENSITIVE Sensitive     CEFAZOLIN <=4 SENSITIVE Sensitive     CEFEPIME <=0.12 SENSITIVE Sensitive     CEFTAZIDIME <=1 SENSITIVE Sensitive     CEFTRIAXONE <=0.25 SENSITIVE Sensitive     CIPROFLOXACIN <=0.25 SENSITIVE Sensitive     GENTAMICIN <=1 SENSITIVE Sensitive     IMIPENEM 4 SENSITIVE Sensitive     TRIMETH/SULFA <=20 SENSITIVE Sensitive     AMPICILLIN/SULBACTAM <=2 SENSITIVE Sensitive     PIP/TAZO <=4 SENSITIVE Sensitive     * ABUNDANT PROTEUS MIRABILIS  Blood Culture (routine x 2)     Status: Abnormal   Collection Time: 11/05/2019  3:15 PM   Specimen: BLOOD  Result  Value Ref Range Status   Specimen Description   Final    BLOOD RIGHT ANTECUBITAL Performed at [facility], [place]  [address] [place], [place] [address]    Special Requests   Final    BOTTLES DRAWN AEROBIC AND ANAEROBIC Blood Culture adequate volume Performed at [facility], [address], [place], [place] [address]    Culture  Setup Time   Final    GRAM NEGATIVE RODS Gram Stain Report Called to,Read Back By and Verified With: OLIVER,J ON 10/17/19 AT 1910 BY LOY,C PERFORMED AT [facility] ANAEROBIC BOTTLE GRAM POSITIVE COCCI AEROBIC BOTTLE ONLY Gram Stain Report Called to,Read Back By and Verified With: H EVANS,RN_0  10/18/19 MKELLY    Culture (A)  Final    STAPHYLOCOCCUS EPIDERMIDIS THE SIGNIFICANCE OF ISOLATING THIS ORGANISM FROM A SINGLE SET OF BLOOD CULTURES WHEN MULTIPLE SETS ARE DRAWN IS UNCERTAIN. PLEASE NOTIFY THE MICROBIOLOGY DEPARTMENT WITHIN ONE WEEK IF SPECIATION AND SENSITIVITIES ARE REQUIRED. BACTEROIDES FRAGILIS BETA LACTAMASE POSITIVE Performed at [facility], [place] [address] [place], [place] [address]    Report Status 10/19/2019 FINAL  Final  Blood Culture ID Panel (Reflexed)     Status: None   Collection Time: 10/15/2019  3:15 PM  Result Value Ref Range Status   Enterococcus species NOT DETECTED NOT DETECTED Final   Listeria monocytogenes NOT DETECTED NOT DETECTED Final   Staphylococcus species NOT DETECTED NOT DETECTED Final   Staphylococcus aureus (BCID) NOT DETECTED NOT DETECTED Final   Streptococcus species NOT DETECTED NOT DETECTED Final   Streptococcus agalactiae NOT DETECTED NOT DETECTED Final   Streptococcus pneumoniae NOT DETECTED NOT DETECTED Final   Streptococcus pyogenes NOT DETECTED NOT DETECTED Final   Acinetobacter baumannii NOT DETECTED NOT DETECTED Final   Enterobacteriaceae species NOT DETECTED NOT DETECTED Final   Enterobacter cloacae complex NOT DETECTED NOT DETECTED Final   Escherichia coli NOT DETECTED NOT DETECTED Final   Klebsiella  oxytoca NOT DETECTED NOT DETECTED Final   Klebsiella pneumoniae NOT DETECTED NOT DETECTED Final   Proteus species NOT DETECTED NOT DETECTED Final   Serratia marcescens NOT DETECTED NOT DETECTED Final   Haemophilus influenzae NOT DETECTED NOT DETECTED Final   Neisseria meningitidis NOT DETECTED NOT DETECTED Final   Pseudomonas aeruginosa NOT DETECTED NOT DETECTED Final   Candida albicans NOT DETECTED NOT DETECTED Final   Candida glabrata NOT DETECTED NOT DETECTED Final   Candida krusei NOT DETECTED NOT DETECTED Final   Candida parapsilosis NOT DETECTED NOT DETECTED Final   Candida tropicalis NOT DETECTED NOT DETECTED Final    Comment: Performed at [facility], [place] [address], [place], [place] [address]  Respiratory Panel by RT PCR (Flu A&B, Covid) - Nasopharyngeal Swab     Status: None   Collection Time: 10/11/2019  5:00 PM   Specimen: Nasopharyngeal Swab  Result Value Ref Range Status   SARS Coronavirus 2 by RT PCR NEGATIVE NEGATIVE Final    Comment: (NOTE) SARS-CoV-2 target nucleic acids are NOT DETECTED. The SARS-CoV-2 RNA is generally detectable in upper respiratoy specimens during the acute phase of infection. The lowest concentration of SARS-CoV-2 viral copies this assay can detect is 131 copies/mL. A negative result does not preclude SARS-Cov-2 infection and should not be used as the sole basis for treatment or other patient management decisions. A negative result may occur with  improper specimen collection/handling, submission of specimen other than nasopharyngeal swab, presence of viral mutation(s) within the areas targeted by this assay, and inadequate number of viral copies (<131 copies/mL). A negative result must be combined with clinical observations, patient history, and epidemiological information. The expected result is Negative. Fact Sheet  for Patients:  PinkCheek.be Fact Sheet for Healthcare Providers:   GravelBags.it This test is not yet ap proved or cleared by the [place] FDA and  has been authorized for detection and/or diagnosis of SARS-CoV-2 by FDA under an Emergency Use Authorization (EUA). This EUA will remain  in effect (meaning this test can be used) for the duration of the COVID-19 declaration under Section 564(b)(1) of the Act, 21 U.S.C. section 360bbb-3(b)(1), unless the authorization is terminated or revoked sooner.    Influenza A by PCR NEGATIVE NEGATIVE Final   Influenza B by PCR NEGATIVE NEGATIVE Final    Comment: (NOTE) The Xpert Xpress SARS-CoV-2/FLU/RSV assay is intended as an aid in  the diagnosis of influenza from Nasopharyngeal swab specimens and  should not be used as a sole basis for treatment. Nasal washings and  aspirates are unacceptable for Xpert Xpress SARS-CoV-2/FLU/RSV  testing. Fact Sheet for Patients: PinkCheek.be Fact Sheet for Healthcare Providers: GravelBags.it This test is not yet approved or cleared by the [place] FDA and  has been authorized for detection and/or diagnosis of SARS-CoV-2 by  FDA under an Emergency Use Authorization (EUA). This EUA will remain  in effect (meaning this test can be used) for the duration of the  Covid-19 declaration under Section 564(b)(1) of the Act, 21  U.S.C. section 360bbb-3(b)(1), unless the authorization is  terminated or revoked. Performed at [facility], [address], [place], [place] [address]   Blood Culture (routine x 2)     Status: None   Collection Time: 10/11/2019  5:13 PM   Specimen: Left Antecubital; Blood  Result Value Ref Range Status   Specimen Description LEFT ANTECUBITAL  Final   Special Requests   Final    BOTTLES DRAWN AEROBIC ONLY Blood Culture results may not be optimal due to an inadequate volume of blood received in culture bottles   Culture   Final    NO GROWTH 5 DAYS Performed at [facility], [address], [place], [place] [address]    Report Status 10/21/2019 FINAL  Final  Urine culture     Status: Abnormal   Collection Time: 10/17/19  2:26 PM   Specimen: In/Out Cath Urine  Result Value Ref Range Status   Specimen Description   Final    IN/OUT CATH URINE Performed at [facility], [address] [place], [place] [address]    Special Requests   Final    NONE Performed at [facility], [address] [place], [place] [address]    Culture 20,000 COLONIES/mL KLEBSIELLA PNEUMONIAE (A)  Final   Report Status 10/20/2019 FINAL  Final   Organism ID, Bacteria KLEBSIELLA PNEUMONIAE (A)  Final      Susceptibility   Klebsiella pneumoniae - MIC*    AMPICILLIN >=32 RESISTANT Resistant     CEFAZOLIN <=4 SENSITIVE Sensitive     CEFTRIAXONE <=0.25 SENSITIVE Sensitive     CIPROFLOXACIN <=0.25 SENSITIVE Sensitive     GENTAMICIN <=1 SENSITIVE Sensitive     IMIPENEM 1 SENSITIVE Sensitive     NITROFURANTOIN 64 INTERMEDIATE Intermediate     TRIMETH/SULFA <=20 SENSITIVE Sensitive     AMPICILLIN/SULBACTAM 8 SENSITIVE Sensitive     PIP/TAZO <=4 SENSITIVE Sensitive     * 20,000 COLONIES/mL KLEBSIELLA PNEUMONIAE  Aerobic/Anaerobic Culture (surgical/deep wound)     Status: None   Collection Time: 10/17/19  6:14 PM   Specimen: Abscess  Result Value Ref Range Status   Specimen Description   Final    ABSCESS Performed  at [facility], [address], [place], [place] [address]    Special Requests   Final    RIGHT Performed at [facility], [address], [place], [place] [address]    Gram Stain NO WBC SEEN NO ORGANISMS SEEN   Final   Culture   Final    FEW ESCHERICHIA COLI FEW PROTEUS MIRABILIS MODERATE BACTEROIDES FRAGILIS BETA LACTAMASE POSITIVE Performed at [facility], [place] [address] [place], [place] [address]    Report Status 10/22/2019 FINAL  Final   Organism ID, Bacteria ESCHERICHIA COLI  Final   Organism ID, Bacteria PROTEUS MIRABILIS  Final       Susceptibility   Escherichia coli - MIC*    AMPICILLIN >=32 RESISTANT Resistant     CEFAZOLIN 16 SENSITIVE Sensitive     CEFEPIME <=0.12 SENSITIVE Sensitive     CEFTAZIDIME <=1 SENSITIVE Sensitive     CEFTRIAXONE <=0.25 SENSITIVE Sensitive     CIPROFLOXACIN <=0.25 SENSITIVE Sensitive     GENTAMICIN <=1 SENSITIVE Sensitive     IMIPENEM <=0.25 SENSITIVE Sensitive     TRIMETH/SULFA <=20 SENSITIVE Sensitive     AMPICILLIN/SULBACTAM >=32 RESISTANT Resistant     PIP/TAZO <=4 SENSITIVE Sensitive     * FEW ESCHERICHIA COLI   Proteus mirabilis - MIC*    AMPICILLIN <=2 SENSITIVE Sensitive     CEFAZOLIN <=4 SENSITIVE Sensitive     CEFEPIME <=0.12 SENSITIVE Sensitive     CEFTAZIDIME <=1 SENSITIVE Sensitive     CEFTRIAXONE <=0.25 SENSITIVE Sensitive     CIPROFLOXACIN <=0.25 SENSITIVE Sensitive     GENTAMICIN <=1 SENSITIVE Sensitive     IMIPENEM 4 SENSITIVE Sensitive     TRIMETH/SULFA <=20 SENSITIVE Sensitive     AMPICILLIN/SULBACTAM <=2 SENSITIVE Sensitive     PIP/TAZO <=4 SENSITIVE Sensitive     * FEW PROTEUS MIRABILIS  Aerobic/Anaerobic Culture (surgical/deep wound)     Status: Abnormal   Collection Time: 10/17/19  6:14 PM   Specimen: Abscess  Result Value Ref Range Status   Specimen Description   Final    ABSCESS Performed at [facility], [address] [place], [place] [address]    Special Requests   Final    LEFT Performed at [facility], [address], [place], [place] [address]    Gram Stain   Final    FEW WBC PRESENT,BOTH PMN AND MONONUCLEAR RARE GRAM VARIABLE ROD RARE GRAM POSITIVE COCCI RARE YEAST    Culture (A)  Final    MULTIPLE ORGANISMS PRESENT, NONE PREDOMINANT FEW CANDIDA LUSITANIAE NO STAPHYLOCOCCUS AUREUS ISOLATED NO GROUP A STREP (S.PYOGENES) ISOLATED MODERATE BACTEROIDES FRAGILIS POSITIVE Performed at [facility], [place] [address] [place], [place] [address]    Report Status 10/22/2019 FINAL  Final  MRSA PCR Screening     Status: None   Collection  Time: 10/20/19  4:57 PM   Specimen: Nasal Mucosa; Nasopharyngeal  Result Value Ref Range Status   MRSA by PCR NEGATIVE NEGATIVE Final    Comment:        The GeneXpert MRSA Assay (FDA approved for NASAL specimens only), is one component of a comprehensive MRSA colonization surveillance program. It is not intended to diagnose MRSA infection nor to guide or monitor treatment for MRSA infections. Performed at [facility], [address], [place], [place] [address]   Culture, blood (Routine X 2) w Reflex to ID Panel     Status: None (Preliminary result)   Collection Time: 10/21/19  6:10 PM  Specimen: A-Line; Blood  Result Value Ref Range Status   Specimen Description A-LINE  Final   Special Requests   Final    BOTTLES DRAWN AEROBIC AND ANAEROBIC Blood Culture adequate volume   Culture   Final    NO GROWTH 2 DAYS Performed at [facility], [address] [address] [place], [place] [id number]    Report Status PENDING  Incomplete  Culture, blood (Routine X 2) w Reflex to ID Panel     Status: None (Preliminary result)   Collection Time: 10/22/19  4:34 AM   Specimen: BLOOD  Result Value Ref Range Status   Specimen Description BLOOD SITE NOT SPECIFIED  Final   Special Requests   Final    BOTTLES DRAWN AEROBIC AND ANAEROBIC Blood Culture adequate volume   Culture   Final    NO GROWTH 1 DAY Performed at [facility], [address], [place], [place] [address]    Report Status PENDING  Incomplete    Radiology Reports CT ABDOMEN PELVIS WO CONTRAST  Result Date: 10/31/2019 CLINICAL DATA:  Decubitus ulcers EXAM: CT ABDOMEN AND PELVIS WITHOUT CONTRAST TECHNIQUE: Multidetector CT imaging of the abdomen and pelvis was performed following the standard protocol without IV contrast. COMPARISON:  None. FINDINGS: Lower chest: No acute abnormality. Hepatobiliary: Scattered calcifications are noted within the liver. Gallbladder is well distended with multiple dependent gallstones. Pancreas: Unremarkable. No  pancreatic ductal dilatation or surrounding inflammatory changes. Spleen: Normal in size without focal abnormality. Adrenals/Urinary Tract: Adrenal glands are within normal limits. Kidneys are well visualized bilaterally with renal vascular calcifications. No renal stones are seen. No obstructive changes are noted. The bladder is partially distended. Stomach/Bowel: The appendix is not well visualized although no inflammatory changes to suggest appendicitis are noted. Retained fecal material is noted throughout the colon consistent with a mild degree of constipation without obstructive change. No small bowel abnormality is seen. Stomach is unremarkable. Vascular/Lymphatic: Aortic atherosclerosis. No enlarged abdominal or pelvic lymph nodes. Reproductive: Uterus and bilateral adnexa are unremarkable. Other: No abdominal wall hernia or abnormality. No abdominopelvic ascites. Musculoskeletal: Mild changes of anasarca are noted. Decubitus ulcer is noted laterally near the right hip with inflammatory changes extending from the skin towards the greater trochanter. This area measures approximately 7.6 x 4.3 cm. No definitive bony erosive changes to suggest osteomyelitis are noted at this time. No other definitive decubitus ulcer is seen. Degenerative changes of lumbar spine are seen. IMPRESSION: Right lateral decubitus ulcer with subcutaneous inflammatory changes extending towards the greater trochanter of the right proximal femur. No definitive bony destructive changes are seen. Considerable subcutaneous inflammatory changes noted with air tracking towards the lateral aspect of the femur. Changes of mild constipation. Cholelithiasis without complicating factors. Electronically Signed   By: Inez Catalina M.D.   On: 10/29/2019 16:51   US RENAL  Result Date: 10/18/2019 CLINICAL DATA:  Acute renal insufficiency. EXAM: RENAL / URINARY TRACT ULTRASOUND COMPLETE COMPARISON:  10/31/2019 CT FINDINGS: Right Kidney: Renal  measurements: 9.3 x 4.4 x 5.8 cm = volume: 123 mL. Increased renal echogenicity. No hydronephrosis. Left Kidney: Renal measurements: 9.6 x 5.2 x 4.6 cm = volume: 119 mL. Increased renal echogenicity. No hydronephrosis. Bladder: Decompressed. Other: Trace perinephric edema/fluid likely relates to renal insufficiency. Note is made of gallbladder sludge and stones including at 1.0 cm. IMPRESSION: 1. No hydronephrosis. Increased renal echogenicity, consistent with medical renal disease. 2. Cholelithiasis without acute cholecystitis. Electronically Signed   By: Abigail Miyamoto M.D.   On: 10/18/2019 11:51  DG Chest Port 1 View  Result Date: 10/21/2019 CLINICAL DATA:  Sepsis EXAM: PORTABLE CHEST 1 VIEW COMPARISON:  11/05/2014 FINDINGS: Stable positioning of a left-sided implanted cardiac device. Post CABG changes. Stable cardiomediastinal contours. No focal airspace consolidation, pleural effusion, or pneumothorax. IMPRESSION: No acute cardiopulmonary findings. Electronically Signed   By: Davina Poke D.O.   On: 10/10/2019 14:47   [place] EKG SITE RITE  Result Date: 10/20/2019 If Site Rite image not attached, placement could not be confirmed due to current cardiac rhythm.    CBC Recent Labs  Lab 10/28/2019 1515 10/17/19 0607 10/19/19 0500 10/20/19 0709 10/21/19 1633 10/22/19 0949 10/23/19 0855  WBC 21.7*   < > 19.5* 22.1* 19.8* 12.5* 9.2  HGB 8.6*   < > 8.2* 7.9* 6.9* 10.3* 10.1*  HCT 28.9*   < > 29.0* 27.3* 22.6* 32.6* 31.6*  PLT 299   < > 197 206 188 117* 74*  MCV 100.3*   < > 103.6* 100.7* 97.0 93.4 93.8  MCH 29.9   < > 29.3 29.2 29.6 29.5 30.0  MCHC 29.8*   < > 28.3* 28.9* 30.5 31.6 32.0  RDW 14.4   < > 14.8 15.2 15.6* 16.1* 16.6*  LYMPHSABS 0.8  --   --   --   --   --   --   MONOABS 0.7  --   --   --   --   --   --   EOSABS 0.0  --   --   --   --   --   --   BASOSABS 0.0  --   --   --   --   --   --    < > = values in this interval not displayed.    Chemistries  Recent Labs  Lab  10/14/2019 1515 11/05/2019 1740 10/17/19 0607 10/18/19 0611 10/18/19 0611 10/19/19 0500 10/19/19 0500 10/20/19 0709 10/21/19 1633 10/22/19 0949 10/23/19 0417 10/23/19 0855  NA 147*  --    < > 142   < > 138  --  134* 137 138  --  139  K 4.4  --    < > 4.1   < > 4.3  --  4.2 4.1 3.8  --  3.6  CL 112*  --    < > 112*   < > 111  --  107 112* 111  --  113*  CO2 24  --    < > 20*   < > 15*  --  17* 17* 15*  --  16*  GLUCOSE 195*  --    < > 188*   < > 196*  --  113* 125* 141*  --  133*  BUN 54*  --    < > 56*   < > 60*  --  60* 57* 57*  --  56*  CREATININE 1.83*  --    < > 1.81*   < > 1.74*   < > 1.82* 1.72* 1.68* 1.64* 1.64*  CALCIUM 7.9*  --    < > 7.1*   < > 6.7*  --  6.7* 6.7* 6.8*  --  6.8*  MG  --  2.4  --  2.3  --   --   --   --  1.9  --   --   --   AST 20  --   --   --   --   --   --   --   --  19  --   --   ALT 13  --   --   --   --   --   --   --   --  13  --   --   ALKPHOS 81  --   --   --   --   --   --   --   --  85  --   --   BILITOT 0.8  --   --   --   --   --   --   --   --  1.0  --   --    < > = values in this interval not displayed.   ------------------------------------------------------------------------------------------------------------------ No results for input(s): CHOL, HDL, LDLCALC, TRIG, CHOLHDL, LDLDIRECT in the last 72 hours.  Lab Results  Component Value Date   HGBA1C 5.5 10/19/2019   ------------------------------------------------------------------------------------------------------------------ No results for input(s): TSH, T4TOTAL, T3FREE, THYROIDAB in the last 72 hours.  Invalid input(s): FREET3 ------------------------------------------------------------------------------------------------------------------ No results for input(s): VITAMINB12, FOLATE, FERRITIN, TIBC, IRON, RETICCTPCT in the last 72 hours.  Coagulation profile Recent Labs  Lab 11/02/2019 1515  INR 1.1    No results for input(s): DDIMER in the last 72 hours.  Cardiac  Enzymes No results for input(s): CKMB, TROPONINI, MYOGLOBIN in the last 168 hours.  Invalid input(s): CK ------------------------------------------------------------------------------------------------------------------    Component Value Date/Time   BNP 583.7 (H) 11/01/2014 5573     Roxan Hockey M.D on 10/23/2019 at 2:56 PM  Go to [email or web address] - for contact info  [facility] - Office  [phone]

## 2019-10-24 LAB — GLUCOSE, CAPILLARY
Glucose-Capillary: 130 mg/dL — ABNORMAL HIGH (ref 70–99)
Glucose-Capillary: 108 mg/dL — ABNORMAL HIGH (ref 70–99)
Glucose-Capillary: 123 mg/dL — ABNORMAL HIGH (ref 70–99)
Glucose-Capillary: 113 mg/dL — ABNORMAL HIGH (ref 70–99)

## 2019-10-24 LAB — BASIC METABOLIC PANEL
Anion gap: 9 (ref 5–15)
BUN: 55 mg/dL — ABNORMAL HIGH (ref 8–23)
CO2: 17 mmol/L — ABNORMAL LOW (ref 22–32)
Calcium: 7.1 mg/dL — ABNORMAL LOW (ref 8.9–10.3)
Chloride: 113 mmol/L — ABNORMAL HIGH (ref 98–111)
Creatinine, Ser: 1.56 mg/dL — ABNORMAL HIGH (ref 0.44–1.00)
GFR calc Af Amer: 35 mL/min — ABNORMAL LOW (ref >60)
GFR calc non Af Amer: 31 mL/min — ABNORMAL LOW (ref >60)
Glucose, Bld: 127 mg/dL — ABNORMAL HIGH (ref 70–99)
Potassium: 3.4 mmol/L — ABNORMAL LOW (ref 3.5–5.1)
Sodium: 139 mmol/L (ref 135–145)

## 2019-10-24 LAB — CBC
HCT: 30.9 % — ABNORMAL LOW (ref 36.0–46.0)
Hemoglobin: 9.7 g/dL — ABNORMAL LOW (ref 12.0–15.0)
MCH: 29.2 pg (ref 26.0–34.0)
MCHC: 31.4 g/dL (ref 30.0–36.0)
MCV: 93.1 fL (ref 80.0–100.0)
Platelets: 48 10*3/uL — ABNORMAL LOW (ref 150–400)
RBC: 3.32 MIL/uL — ABNORMAL LOW (ref 3.87–5.11)
RDW: 16.8 % — ABNORMAL HIGH (ref 11.5–15.5)
WBC: 8.1 10*3/uL (ref 4.0–10.5)
nRBC: 0 % (ref 0.0–0.2)

## 2019-10-24 MED ORDER — SODIUM CHLORIDE 0.9 % IV SOLN
100.0000 mg | INTRAVENOUS | Status: AC
  Administered 2019-10-25 – 2019-10-28 (×4): 100 mg via INTRAVENOUS
  Filled 2019-10-24 (×4): qty 100

## 2019-10-24 MED ORDER — POTASSIUM CHLORIDE 10 MEQ/100ML IV SOLN
10.0000 meq | INTRAVENOUS | Status: AC
  Administered 2019-10-24 (×4): 10 meq via INTRAVENOUS
  Filled 2019-10-24 (×4): qty 100

## 2019-10-24 NOTE — Progress Notes (Addendum)
Patient Demographics:    [name], is a 85 y.o. female, DOB - November 09, 1934, MWU:[id number]  Admit date - 10/15/2019   Admitting Physician Ejiroghene Arlyce Dice, MD  Outpatient Primary MD for the patient is [name], MD  LOS - 8   Chief Complaint  Patient presents with  . Skin Ulcer        Subjective:    [name] today has no fevers, no emesis,  No chest pain,    -Patient is resting comfortably,  --Husband and daughter [name] from [place] at bedside--- Long, extensive discussions about plan of care and outlook--- husband and daughter with questions.. --Patient still unable to have oral intake due to advanced cognitive deficits--- she will spit food out, clenching her jaw and resist any attempt to feed her  Assessment  & Plan :    Principal Problem:   Severe sepsis with septic shock - Klebsiella/Proteus/E. coli/Bacteroides and Staph  Active Problems:   S/P ICD (internal cardiac defibrillator) procedure, with upgrade to ICD/CRT-D  [employer]   Cardiomyopathy, ischemic EF 20 to 25 %/history of combined systolic and diastolic dysfunction CHF   Decubitus ulcer of right hip, stage 4 /Infected with E-coli and Proteus   Failure to thrive in adult   Decubitus ulcer of left hip, stage 3/Infected with E-coli and Proteus   Goals of care, counseling/discussion   Hematochezia   Sepsis secondary to Klebsiella UTI ([place])   Anemia due to acute blood loss/rectal bleeding   Type II diabetes mellitus ([place])   AKI (acute kidney injury) ([place])   Hypertension   Mobitz type 2 second degree atrioventricular block   Polymorphic ventricular tachycardia (HCC)   Sepsis (HCC)   Stage I pressure ulcer of sacral region   Bacteremia due to Gram-negative bacteria   Gram negative sepsis ([place])   Palliative care by specialist   Encounter for hospice care discussion   Brief Summary 85 year old female with a  history of advanced Alzheimer's dementia, CAD, H/o combined systolic and diastolic dysfunction CHF/HFrEF/ischemic cardiomyopathy with EF 20 to 25%, h/o torsades VT status post AICD, H/o HTN, DM2, HLD, admitted on 10/26/2019 from home with metabolic encephalopathy in the setting of presumed infection (leukocytosis and Lactic acid was 3.1) with multiple decubitus ulcers --Able to come off warming blanket on 10/22/19 -Weaned off IV Levophed on 10/22/19 ---Patient still unable to have oral intake due to advanced cognitive deficits--- she will spit food out, clenching her jaw and resist any attempt to feed her   Assessment/Plan: 1)Severe sepsis with septic shock-- POA -Etiology is multifactorial:-Patient has Klebsiella UTI (urine cx 10/17/19), E. coli and Proteus wound decubitus ulcer/wound infection (wound cx from 10/19/2019 and 10/17/19) and staph epi and Bacteroides bacteremia from blood culture dated 10/20/2019 --Able to come off warming blanket on 10/22/19 -Weaned off IV Levophed on 10/22/19 --Stopped vancomycin after 10/21/2019 -MRSA PCR negative on 10/20/2019 -De-escalate to IV Rocephin and metronidazole on 10/22/2019 Stopped cefepime -Repeat blood culture from 10/21/2019 NGTD -Patient remains encephalopathic, at baseline she does have advanced dementia however with significant cognitive and memory deficits -WBC is down to 8.1 from a peak of 25.6 -PCT 2.91, ESR 60, CRP 14.1 - overall prognosis remains poor especially given lack of oral intake  2)Bacteremia--B.fragilis and staph epi--- ???  Significance of blood culture from 10/17/2019 -??  Contaminant, however patient is quite septic --Sepsis pathophysiology and Antibiotics as above #1 -Repeat blood cultures on 10/21/2019 NGTD   3)Infected Bilateral Hip and Sacral decubitus ulcers--please see photos in epic -10/30/2019 CT abdomen/pelvis--right lateral decubitus ulcer with subcutaneous inflammatory changes extending toward the greater trochanter. There is  no bony destructive changes. There is considerable inflammatory change with air tracking toward the lateral aspect of the femur. -General surgery consultappreciated -10/17/19--bedside debridement -Wound cultures from 10/31/2019 and 10/17/2019 with E. coli and Proteus --Wound culture with fungus reluctant to use Diflucan due to prolonged QT concerns -Sepsis pathophysiology and Antibiotics as above #1  4) Klebsiella UTI--urine culture from 10/17/2019 noted --Sepsis pathophysiology and Antibiotics as above #1  5)Failure to thrive -Serum B12--199-->supplement -TSH--37.364, Free T4--0.28 -Folate--7.2 -PT evaluation-->SNF -continues to have poor po intake -Cognitive status and encephalopathy makes it difficult to feed orally ---Patient still unable to have oral intake due to advanced cognitive deficits--- she will spit food out, clenching her jaw and resist any attempt to feed her -Continue judicious IV dextrose fluids given that patient's EF is down to 20 to 25% -Ensure supplements refused by patient --Remains very challenging to feed patient as she closes her mouth , clenching her jaw and spits out anything -husband would like to wait until further conversation with his family to decide on possible NG tube/Keofeed Tube placement  6) symptomatic acute on chronic blood loss anemia due to rectal bleeding-hemoglobin is up  to 9.7 from 6.9 after transfusion of 2 units of PRBC on 10/21/2019 --- Hypothermia has resolved, hypotension resolved -No further rectal bleeding -Protonix for GI prophylaxis GI consult appreciated suspect rectal injury from fecal impaction--suppositories as ordered --Severe sepsis with septic shock and hemodynamic instability precludes endoluminal evaluation  7)CKD stage 3b -initially felt to be AKI but now more likely progression of CKD -pt likely has progression of underlying CKD -Creatinine hovering below 2 -No recent renal labs available to compare current values  to -renal US-->medical renal disease -Creatinine is down to 1.56 from a peak of 1.83 - renally adjust medications, avoid nephrotoxic agents / dehydration  / hypotension  8)H/o combined systolic and diastolic dysfunction CHF/HFrEF/ischemic cardiomyopathy with EF 20 to 25%- -02/2015 echo EF 20-25%, G2DD -BP improving and poor oral intake -Judicious IV fluids  9)Hypernatremia/dehydration -Resolved with IV fluids, -due to continued poor po intake--D5 half-normal as ordered  10)Alzheimer's dementia/ambulatory dysfunction -Continue Namenda -spouse states pt screamsfrequently at home -At baseline patient has very advanced dementia with severe cognitive and memory deficits, she has been bed-bound for the last 3 months at least, requiring total care  11)Coronary artery disease -hx of CABG in 2009 (LIMA-LAD, SVG-ramus, SVG-OM). Hx of RCA stent 04/2012 -no chest pain presently Discontinued Aspirin and Plavix due to rectal bleeding with symptomatic Anemia -Continue statin -Coreg on hold due to hypotension  12)Social/Ethics--- overall prognosis is poor, patient is a DNR/DNI -Plan of care, goals of care discussed with patient's husband at bedside, I also called and previously discussed case with patient's son [name] at [phone]--- questions answered - also discussed with patient's daughter [name] who is from [place] -Official palliative care consult appreciated  13)Ventricular Tachycardia/H/o Torsades -status post AICD placement - she has CRT-D device followed by EP - she underwent ICD upgrade of single chamber ICD to [employer] CRT-D   Disposition Plan: Patient From: Home D/C Place:SNF when medically stable Barriers: Not Clinically Stable--poor po intake, severe bacterial infection requiring IV antibiotics and IV  fluids  -Not medically stable for discharge at this time  Code Status : DNR  Procedures-- bedside decubitus ulcer debridement on 10/17/2019 -Right arm PICC  line placement on 10/21/2019 -transfusion of 2 units of PRBC on 10/21/2019 -Bedside decubitus debridement by Dr. Constance Haw on 10/22/2019  Family Communication:   Spouse updatedat bedside - I previously called and discussed case with patient's son [name] at [phone]--- questions answered -Discussed with patient daughter [name]  from [place]  Consults  : Palliative care and general surgery  DVT Prophylaxis  :  Teds and SCDs/rectal bleeding   Lab Results  Component Value Date   PLT 48 (L) 10/24/2019    Inpatient Medications  Scheduled Meds: . sodium chloride   Intravenous Once  . sodium chloride   Intravenous Once  . sodium chloride   Intravenous Once  . atorvastatin  80 mg Oral Daily  . Chlorhexidine Gluconate Cloth  6 each Topical Daily  . collagenase   Topical BID  . feeding supplement (ENSURE ENLIVE)  237 mL Oral TID  . folic acid  1 mg Oral Daily  . hydrocortisone  25 mg Rectal BID  . lactulose  30 g Oral Once  . levothyroxine  37.5 mcg Intravenous Q0600  . mouth rinse  15 mL Mouth Rinse BID  . memantine  10 mg Oral BID  . pantoprazole (PROTONIX) IV  40 mg Intravenous Q12H  . polyethylene glycol  17 g Oral Daily  . sodium chloride flush  10-40 mL Intracatheter Q12H  . sodium chloride flush  3 mL Intravenous Q12H  . vitamin B-12  500 mcg Oral Daily   Continuous Infusions: . sodium chloride 10 mL/hr at 10/21/19 2018  . cefTRIAXone (ROCEPHIN)  IV 2 g (10/23/19 2308)  . dextrose 5 % and 0.45% NaCl 50 mL/hr at 10/24/19 0732  . fluconazole (DIFLUCAN) IV    . metronidazole 500 mg (10/24/19 0737)  . norepinephrine (LEVOPHED) Adult infusion Stopped (10/22/19 1330)   PRN Meds:.sodium chloride, acetaminophen **OR** acetaminophen, fentaNYL (SUBLIMAZE) injection, HYDROcodone-acetaminophen, LORazepam, ondansetron (ZOFRAN) IV, sodium chloride flush, sodium chloride flush   Anti-infectives (From admission, onward)   Start     Dose/Rate Route Frequency Ordered Stop    10/24/19 1400  fluconazole (DIFLUCAN) IVPB 200 mg     200 mg 100 mL/hr over 60 Minutes Intravenous Every 24 hours 10/23/19 1144     10/23/19 1400  fluconazole (DIFLUCAN) IVPB 400 mg     400 mg 100 mL/hr over 120 Minutes Intravenous  Once 10/23/19 1144 10/23/19 2302   10/22/19 2200  cefTRIAXone (ROCEPHIN) 2 g in sodium chloride 0.9 % 100 mL IVPB     2 g 200 mL/hr over 30 Minutes Intravenous Every 24 hours 10/22/19 1022     10/17/19 2359  vancomycin (VANCOREADY) IVPB 1250 mg/250 mL     1,250 mg 166.7 mL/hr over 90 Minutes Intravenous Every 48 hours 10/10/2019 2022 10/22/19 2222   10/23/2019 2359  metroNIDAZOLE (FLAGYL) IVPB 500 mg     500 mg 100 mL/hr over 60 Minutes Intravenous Every 8 hours 10/23/2019 2047     11/03/2019 2359  ceFEPIme (MAXIPIME) 2 g in sodium chloride 0.9 % 100 mL IVPB  Status:  Discontinued     2 g 200 mL/hr over 30 Minutes Intravenous Every 24 hours 10/30/2019 2012 10/22/19 1022   10/27/2019 1430  vancomycin (VANCOCIN) IVPB 1000 mg/200 mL premix     1,000 mg 200 mL/hr over 60 Minutes Intravenous  Once 10/23/2019 1428 10/27/2019 1713  10/25/2019 1430  piperacillin-tazobactam (ZOSYN) IVPB 3.375 g     3.375 g 100 mL/hr over 30 Minutes Intravenous  Once 11/01/2019 1428 11/06/2019 1607        Objective:   Vitals:   10/24/19 0900 10/24/19 1000 10/24/19 1100 10/24/19 1150  BP: (!) 106/49 (!) 103/51 (!) 111/52   Pulse: 66 61 60 63  Resp: '15  12 16  ' Temp:    (!) 96.5 F (35.8 C)  TempSrc:    Axillary  SpO2: 100% 100% 100% 100%  Weight:      Height:        Wt Readings from Last 3 Encounters:  10/22/19 80.2 kg  06/13/19 78.5 kg  10/19/18 73.6 kg    Intake/Output Summary (Last 24 hours) at 10/24/2019 1433 Last data filed at 10/24/2019 1015 Gross per 24 hour  Intake 0 ml  Output 175 ml  Net -175 ml   Physical Exam  Gen:-Lethargic and incoherent at times  HEENT:- [place].AT, No sclera icterus Neck-Supple Neck,No JVD,.  Lungs-diminished in bases, no wheezing CV- S1, S2 normal,  regular, CABG scar, AICD in situ Abd-  +ve B.Sounds, Abd Soft, No tenderness,    Extremity-Some upper extremity edema, pedal pulses present  Psych-severe cognitive and memory deficits  neuro-generalized weakness, no new focal deficits, no tremors MSK-right arm PICC line  Skin---  sacral and bilateral hip decubitus ulcers, please see photos in epic    Data Review:   Micro Results Recent Results (from the past 240 hour(s))  Wound or Superficial Culture     Status: None   Collection Time: 10/24/2019  2:28 PM   Specimen: Wound  Result Value Ref Range Status   Specimen Description   Final    WOUND RIGHT ILIAC Performed at [facility], [address] [place], [place] [address]    Special Requests   Final    Normal Performed at [place] [facility] [place], [place] [address]    Gram Stain   Final    RARE WBC PRESENT, PREDOMINANTLY PMN ABUNDANT GRAM NEGATIVE RODS FEW GRAM POSITIVE COCCI RARE GRAM POSITIVE RODS Performed at [facility], [place] [address] [place], [place] [address]    Culture   Final    ABUNDANT ESCHERICHIA COLI ABUNDANT PROTEUS MIRABILIS    Report Status 10/20/2019 FINAL  Final   Organism ID, Bacteria ESCHERICHIA COLI  Final   Organism ID, Bacteria PROTEUS MIRABILIS  Final      Susceptibility   Escherichia coli - MIC*    AMPICILLIN >=32 RESISTANT Resistant     CEFAZOLIN >=64 RESISTANT Resistant     CEFEPIME <=0.12 SENSITIVE Sensitive     CEFTAZIDIME <=1 SENSITIVE Sensitive     CEFTRIAXONE <=0.25 SENSITIVE Sensitive     CIPROFLOXACIN <=0.25 SENSITIVE Sensitive     GENTAMICIN <=1 SENSITIVE Sensitive     IMIPENEM <=0.25 SENSITIVE Sensitive     TRIMETH/SULFA <=20 SENSITIVE Sensitive     AMPICILLIN/SULBACTAM >=32 RESISTANT Resistant     PIP/TAZO <=4 SENSITIVE Sensitive     * ABUNDANT ESCHERICHIA COLI   Proteus mirabilis - MIC*    AMPICILLIN <=2 SENSITIVE Sensitive     CEFAZOLIN <=4 SENSITIVE Sensitive     CEFEPIME <=0.12 SENSITIVE Sensitive      CEFTAZIDIME <=1 SENSITIVE Sensitive     CEFTRIAXONE <=0.25 SENSITIVE Sensitive     CIPROFLOXACIN <=0.25 SENSITIVE Sensitive     GENTAMICIN <=1 SENSITIVE Sensitive     IMIPENEM 4 SENSITIVE Sensitive  TRIMETH/SULFA <=20 SENSITIVE Sensitive     AMPICILLIN/SULBACTAM <=2 SENSITIVE Sensitive     PIP/TAZO <=4 SENSITIVE Sensitive     * ABUNDANT PROTEUS MIRABILIS  Blood Culture (routine x 2)     Status: Abnormal   Collection Time: 10/27/2019  3:15 PM   Specimen: BLOOD  Result Value Ref Range Status   Specimen Description   Final    BLOOD RIGHT ANTECUBITAL Performed at [facility], [place] [address], [place], [place] [address]    Special Requests   Final    BOTTLES DRAWN AEROBIC AND ANAEROBIC Blood Culture adequate volume Performed at [facility], [address], [place], [place] [address]    Culture  Setup Time   Final    GRAM NEGATIVE RODS Gram Stain Report Called to,Read Back By and Verified With: OLIVER,J ON 10/17/19 AT 1910 BY LOY,C PERFORMED AT [facility] ANAEROBIC BOTTLE GRAM POSITIVE COCCI AEROBIC BOTTLE ONLY Gram Stain Report Called to,Read Back By and Verified With: H EVANS,RN'@[id number]'  10/18/19 MKELLY    Culture (A)  Final    STAPHYLOCOCCUS EPIDERMIDIS THE SIGNIFICANCE OF ISOLATING THIS ORGANISM FROM A SINGLE SET OF BLOOD CULTURES WHEN MULTIPLE SETS ARE DRAWN IS UNCERTAIN. PLEASE NOTIFY THE MICROBIOLOGY DEPARTMENT WITHIN ONE WEEK IF SPECIATION AND SENSITIVITIES ARE REQUIRED. BACTEROIDES FRAGILIS BETA LACTAMASE POSITIVE Performed at [facility], [place] [address], [place], [place] [address]    Report Status 10/19/2019 FINAL  Final  Blood Culture ID Panel (Reflexed)     Status: None   Collection Time: 10/27/2019  3:15 PM  Result Value Ref Range Status   Enterococcus species NOT DETECTED NOT DETECTED Final   Listeria monocytogenes NOT DETECTED NOT DETECTED Final   Staphylococcus species NOT DETECTED NOT DETECTED Final   Staphylococcus aureus (BCID) NOT DETECTED NOT DETECTED Final     Streptococcus species NOT DETECTED NOT DETECTED Final   Streptococcus agalactiae NOT DETECTED NOT DETECTED Final   Streptococcus pneumoniae NOT DETECTED NOT DETECTED Final   Streptococcus pyogenes NOT DETECTED NOT DETECTED Final   Acinetobacter baumannii NOT DETECTED NOT DETECTED Final   Enterobacteriaceae species NOT DETECTED NOT DETECTED Final   Enterobacter cloacae complex NOT DETECTED NOT DETECTED Final   Escherichia coli NOT DETECTED NOT DETECTED Final   Klebsiella oxytoca NOT DETECTED NOT DETECTED Final   Klebsiella pneumoniae NOT DETECTED NOT DETECTED Final   Proteus species NOT DETECTED NOT DETECTED Final   Serratia marcescens NOT DETECTED NOT DETECTED Final   Haemophilus influenzae NOT DETECTED NOT DETECTED Final   Neisseria meningitidis NOT DETECTED NOT DETECTED Final   Pseudomonas aeruginosa NOT DETECTED NOT DETECTED Final   Candida albicans NOT DETECTED NOT DETECTED Final   Candida glabrata NOT DETECTED NOT DETECTED Final   Candida krusei NOT DETECTED NOT DETECTED Final   Candida parapsilosis NOT DETECTED NOT DETECTED Final   Candida tropicalis NOT DETECTED NOT DETECTED Final    Comment: Performed at [facility], [place] [address] [place], [place] [address]  Respiratory Panel by RT PCR (Flu A&B, Covid) - Nasopharyngeal Swab     Status: None   Collection Time: 10/30/2019  5:00 PM   Specimen: Nasopharyngeal Swab  Result Value Ref Range Status   SARS Coronavirus 2 by RT PCR NEGATIVE NEGATIVE Final    Comment: (NOTE) SARS-CoV-2 target nucleic acids are NOT DETECTED. The SARS-CoV-2 RNA is generally detectable in upper respiratoy specimens during the acute phase of infection. The lowest concentration of SARS-CoV-2 viral copies this assay can detect is 131 copies/mL. A  negative result does not preclude SARS-Cov-2 infection and should not be used as the sole basis for treatment or other patient management decisions. A negative result may occur with  improper specimen  collection/handling, submission of specimen other than nasopharyngeal swab, presence of viral mutation(s) within the areas targeted by this assay, and inadequate number of viral copies (<131 copies/mL). A negative result must be combined with clinical observations, patient history, and epidemiological information. The expected result is Negative. Fact Sheet for Patients:  PinkCheek.be Fact Sheet for Healthcare Providers:  GravelBags.it This test is not yet ap proved or cleared by the [place] FDA and  has been authorized for detection and/or diagnosis of SARS-CoV-2 by FDA under an Emergency Use Authorization (EUA). This EUA will remain  in effect (meaning this test can be used) for the duration of the COVID-19 declaration under Section 564(b)(1) of the Act, 21 U.S.C. section 360bbb-3(b)(1), unless the authorization is terminated or revoked sooner.    Influenza A by PCR NEGATIVE NEGATIVE Final   Influenza B by PCR NEGATIVE NEGATIVE Final    Comment: (NOTE) The Xpert Xpress SARS-CoV-2/FLU/RSV assay is intended as an aid in  the diagnosis of influenza from Nasopharyngeal swab specimens and  should not be used as a sole basis for treatment. Nasal washings and  aspirates are unacceptable for Xpert Xpress SARS-CoV-2/FLU/RSV  testing. Fact Sheet for Patients: PinkCheek.be Fact Sheet for Healthcare Providers: GravelBags.it This test is not yet approved or cleared by the [place] FDA and  has been authorized for detection and/or diagnosis of SARS-CoV-2 by  FDA under an Emergency Use Authorization (EUA). This EUA will remain  in effect (meaning this test can be used) for the duration of the  Covid-19 declaration under Section 564(b)(1) of the Act, 21  U.S.C. section 360bbb-3(b)(1), unless the authorization is  terminated or revoked. Performed at [facility],  [address] [place], [place] [address]   Blood Culture (routine x 2)     Status: None   Collection Time: 10/28/2019  5:13 PM   Specimen: Left Antecubital; Blood  Result Value Ref Range Status   Specimen Description LEFT ANTECUBITAL  Final   Special Requests   Final    BOTTLES DRAWN AEROBIC ONLY Blood Culture results may not be optimal due to an inadequate volume of blood received in culture bottles   Culture   Final    NO GROWTH 5 DAYS Performed at [facility], [address], [place], [place] [address]    Report Status 10/21/2019 FINAL  Final  Urine culture     Status: Abnormal   Collection Time: 10/17/19  2:26 PM   Specimen: In/Out Cath Urine  Result Value Ref Range Status   Specimen Description   Final    IN/OUT CATH URINE Performed at [facility], [address] [place], [place] [address]    Special Requests   Final    NONE Performed at [facility], [address], [place],  [address]    Culture 20,000 COLONIES/mL KLEBSIELLA PNEUMONIAE (A)  Final   Report Status 10/20/2019 FINAL  Final   Organism ID, Bacteria KLEBSIELLA PNEUMONIAE (A)  Final      Susceptibility   Klebsiella pneumoniae - MIC*    AMPICILLIN >=32 RESISTANT Resistant     CEFAZOLIN <=4 SENSITIVE Sensitive     CEFTRIAXONE <=0.25 SENSITIVE Sensitive     CIPROFLOXACIN <=0.25 SENSITIVE Sensitive     GENTAMICIN <=1 SENSITIVE Sensitive     IMIPENEM 1 SENSITIVE Sensitive  NITROFURANTOIN 64 INTERMEDIATE Intermediate     TRIMETH/SULFA <=20 SENSITIVE Sensitive     AMPICILLIN/SULBACTAM 8 SENSITIVE Sensitive     PIP/TAZO <=4 SENSITIVE Sensitive     * 20,000 COLONIES/mL KLEBSIELLA PNEUMONIAE  Aerobic/Anaerobic Culture (surgical/deep wound)     Status: None   Collection Time: 10/17/19  6:14 PM   Specimen: Abscess  Result Value Ref Range Status   Specimen Description   Final    ABSCESS Performed at [facility], [address], [place], [place] [address]    Special Requests   Final    RIGHT Performed at [facility], [address], [place], [place] [address]    Gram Stain NO WBC SEEN NO ORGANISMS SEEN   Final   Culture   Final    FEW ESCHERICHIA COLI FEW PROTEUS MIRABILIS MODERATE BACTEROIDES FRAGILIS BETA LACTAMASE POSITIVE Performed at [facility], [place] [address], [place], [place] [address]    Report Status 10/22/2019 FINAL  Final   Organism ID, Bacteria ESCHERICHIA COLI  Final   Organism ID, Bacteria PROTEUS MIRABILIS  Final      Susceptibility   Escherichia coli - MIC*    AMPICILLIN >=32 RESISTANT Resistant     CEFAZOLIN 16 SENSITIVE Sensitive     CEFEPIME <=0.12 SENSITIVE Sensitive     CEFTAZIDIME <=1 SENSITIVE Sensitive     CEFTRIAXONE <=0.25 SENSITIVE Sensitive     CIPROFLOXACIN <=0.25 SENSITIVE Sensitive     GENTAMICIN <=1 SENSITIVE Sensitive     IMIPENEM <=0.25 SENSITIVE Sensitive     TRIMETH/SULFA <=20 SENSITIVE Sensitive     AMPICILLIN/SULBACTAM >=32 RESISTANT Resistant     PIP/TAZO <=4 SENSITIVE Sensitive     * FEW ESCHERICHIA COLI   Proteus mirabilis - MIC*    AMPICILLIN <=2 SENSITIVE Sensitive     CEFAZOLIN <=4 SENSITIVE Sensitive     CEFEPIME <=0.12 SENSITIVE Sensitive     CEFTAZIDIME <=1 SENSITIVE Sensitive     CEFTRIAXONE <=0.25 SENSITIVE Sensitive     CIPROFLOXACIN <=0.25 SENSITIVE Sensitive     GENTAMICIN <=1 SENSITIVE Sensitive     IMIPENEM 4 SENSITIVE Sensitive     TRIMETH/SULFA <=20 SENSITIVE Sensitive     AMPICILLIN/SULBACTAM <=2 SENSITIVE Sensitive     PIP/TAZO <=4 SENSITIVE Sensitive     * FEW PROTEUS MIRABILIS  Aerobic/Anaerobic Culture (surgical/deep wound)     Status: Abnormal   Collection Time: 10/17/19  6:14 PM   Specimen: Abscess  Result Value Ref Range Status   Specimen Description   Final    ABSCESS Performed at [facility], [address], [place], [place] [address]    Special Requests   Final    LEFT Performed at [facility], [address], [place], [place] [address]    Gram Stain   Final    FEW WBC PRESENT,BOTH PMN AND  MONONUCLEAR RARE GRAM VARIABLE ROD RARE GRAM POSITIVE COCCI RARE YEAST    Culture (A)  Final    MULTIPLE ORGANISMS PRESENT, NONE PREDOMINANT FEW CANDIDA LUSITANIAE NO STAPHYLOCOCCUS AUREUS ISOLATED NO GROUP A STREP (S.PYOGENES) ISOLATED MODERATE BACTEROIDES FRAGILIS POSITIVE Performed at [facility], [place] [address], [place], [place] [address]    Report Status 10/22/2019 FINAL  Final  MRSA PCR Screening     Status: None   Collection Time: 10/20/19  4:57 PM   Specimen: Nasal Mucosa; Nasopharyngeal  Result Value Ref Range Status   MRSA by PCR NEGATIVE NEGATIVE Final    Comment:  The GeneXpert MRSA Assay (FDA approved for NASAL specimens only), is one component of a comprehensive MRSA colonization surveillance program. It is not intended to diagnose MRSA infection nor to guide or monitor treatment for MRSA infections. Performed at [facility], [address], [place], [place] [address]   Culture, blood (Routine X 2) w Reflex to ID Panel     Status: None (Preliminary result)   Collection Time: 10/21/19  6:10 PM   Specimen: A-Line; Blood  Result Value Ref Range Status   Specimen Description A-LINE  Final   Special Requests   Final    BOTTLES DRAWN AEROBIC AND ANAEROBIC Blood Culture adequate volume   Culture   Final    NO GROWTH 3 DAYS Performed at [facility], [address], [place], [place] [address]    Report Status PENDING  Incomplete  Culture, blood (Routine X 2) w Reflex to ID Panel     Status: None (Preliminary result)   Collection Time: 10/22/19  4:34 AM   Specimen: BLOOD  Result Value Ref Range Status   Specimen Description BLOOD SITE NOT SPECIFIED  Final   Special Requests   Final    BOTTLES DRAWN AEROBIC AND ANAEROBIC Blood Culture adequate volume   Culture   Final    NO GROWTH 2 DAYS Performed at [facility], [address], [place], [place] [address]    Report Status PENDING  Incomplete    Radiology Reports CT ABDOMEN PELVIS WO  CONTRAST  Result Date: 10/26/2019 CLINICAL DATA:  Decubitus ulcers EXAM: CT ABDOMEN AND PELVIS WITHOUT CONTRAST TECHNIQUE: Multidetector CT imaging of the abdomen and pelvis was performed following the standard protocol without IV contrast. COMPARISON:  None. FINDINGS: Lower chest: No acute abnormality. Hepatobiliary: Scattered calcifications are noted within the liver. Gallbladder is well distended with multiple dependent gallstones. Pancreas: Unremarkable. No pancreatic ductal dilatation or surrounding inflammatory changes. Spleen: Normal in size without focal abnormality. Adrenals/Urinary Tract: Adrenal glands are within normal limits. Kidneys are well visualized bilaterally with renal vascular calcifications. No renal stones are seen. No obstructive changes are noted. The bladder is partially distended. Stomach/Bowel: The appendix is not well visualized although no inflammatory changes to suggest appendicitis are noted. Retained fecal material is noted throughout the colon consistent with a mild degree of constipation without obstructive change. No small bowel abnormality is seen. Stomach is unremarkable. Vascular/Lymphatic: Aortic atherosclerosis. No enlarged abdominal or pelvic lymph nodes. Reproductive: Uterus and bilateral adnexa are unremarkable. Other: No abdominal wall hernia or abnormality. No abdominopelvic ascites. Musculoskeletal: Mild changes of anasarca are noted. Decubitus ulcer is noted laterally near the right hip with inflammatory changes extending from the skin towards the greater trochanter. This area measures approximately 7.6 x 4.3 cm. No definitive bony erosive changes to suggest osteomyelitis are noted at this time. No other definitive decubitus ulcer is seen. Degenerative changes of lumbar spine are seen. IMPRESSION: Right lateral decubitus ulcer with subcutaneous inflammatory changes extending towards the greater trochanter of the right proximal femur. No definitive bony destructive  changes are seen. Considerable subcutaneous inflammatory changes noted with air tracking towards the lateral aspect of the femur. Changes of mild constipation. Cholelithiasis without complicating factors. Electronically Signed   By: Inez Catalina M.D.   On: 10/11/2019 16:51   US RENAL  Result Date: 10/18/2019 CLINICAL DATA:  Acute renal insufficiency. EXAM: RENAL / URINARY TRACT ULTRASOUND COMPLETE COMPARISON:  11/05/2019 CT FINDINGS: Right Kidney: Renal measurements: 9.3 x 4.4 x 5.8 cm = volume: 123 mL. Increased renal  echogenicity. No hydronephrosis. Left Kidney: Renal measurements: 9.6 x 5.2 x 4.6 cm = volume: 119 mL. Increased renal echogenicity. No hydronephrosis. Bladder: Decompressed. Other: Trace perinephric edema/fluid likely relates to renal insufficiency. Note is made of gallbladder sludge and stones including at 1.0 cm. IMPRESSION: 1. No hydronephrosis. Increased renal echogenicity, consistent with medical renal disease. 2. Cholelithiasis without acute cholecystitis. Electronically Signed   By: Abigail Miyamoto M.D.   On: 10/18/2019 11:51   DG Chest Port 1 View  Result Date: 11/03/2019 CLINICAL DATA:  Sepsis EXAM: PORTABLE CHEST 1 VIEW COMPARISON:  11/05/2014 FINDINGS: Stable positioning of a left-sided implanted cardiac device. Post CABG changes. Stable cardiomediastinal contours. No focal airspace consolidation, pleural effusion, or pneumothorax. IMPRESSION: No acute cardiopulmonary findings. Electronically Signed   By: Davina Poke D.O.   On: 10/30/2019 14:47   [place] EKG SITE RITE  Result Date: 10/20/2019 If Site Rite image not attached, placement could not be confirmed due to current cardiac rhythm.   CBC Recent Labs  Lab 10/20/19 0709 10/21/19 1633 10/22/19 0949 10/23/19 0855 10/24/19 0842  WBC 22.1* 19.8* 12.5* 9.2 8.1  HGB 7.9* 6.9* 10.3* 10.1* 9.7*  HCT 27.3* 22.6* 32.6* 31.6* 30.9*  PLT 206 188 117* 74* 48*  MCV 100.7* 97.0 93.4 93.8 93.1  MCH 29.2 29.6 29.5 30.0 29.2   MCHC 28.9* 30.5 31.6 32.0 31.4  RDW 15.2 15.6* 16.1* 16.6* 16.8*    Chemistries  Recent Labs  Lab 10/18/19 0611 10/19/19 0500 10/20/19 0709 10/20/19 0709 10/21/19 1633 10/22/19 0949 10/23/19 0417 10/23/19 0855 10/24/19 0842  NA 142   < > 134*  --  137 138  --  139 139  K 4.1   < > 4.2  --  4.1 3.8  --  3.6 3.4*  CL 112*   < > 107  --  112* 111  --  113* 113*  CO2 20*   < > 17*  --  17* 15*  --  16* 17*  GLUCOSE 188*   < > 113*  --  125* 141*  --  133* 127*  BUN 56*   < > 60*  --  57* 57*  --  56* 55*  CREATININE 1.81*   < > 1.82*   < > 1.72* 1.68* 1.64* 1.64* 1.56*  CALCIUM 7.1*   < > 6.7*  --  6.7* 6.8*  --  6.8* 7.1*  MG 2.3  --   --   --  1.9  --   --   --   --   AST  --   --   --   --   --  19  --   --   --   ALT  --   --   --   --   --  13  --   --   --   ALKPHOS  --   --   --   --   --  85  --   --   --   BILITOT  --   --   --   --   --  1.0  --   --   --    < > = values in this interval not displayed.   ------------------------------------------------------------------------------------------------------------------ No results for input(s): CHOL, HDL, LDLCALC, TRIG, CHOLHDL, LDLDIRECT in the last 72 hours.  Lab Results  Component Value Date   HGBA1C 5.5 10/19/2019   ------------------------------------------------------------------------------------------------------------------ No results for input(s): TSH, T4TOTAL, T3FREE, THYROIDAB in the last 72  hours.  Invalid input(s): FREET3 ------------------------------------------------------------------------------------------------------------------ No results for input(s): VITAMINB12, FOLATE, FERRITIN, TIBC, IRON, RETICCTPCT in the last 72 hours.  Coagulation profile No results for input(s): INR, PROTIME in the last 168 hours.  No results for input(s): DDIMER in the last 72 hours.  Cardiac Enzymes No results for input(s): CKMB, TROPONINI, MYOGLOBIN in the last 168 hours.  Invalid input(s):  CK ------------------------------------------------------------------------------------------------------------------    Component Value Date/Time   BNP 583.7 (H) 11/01/2014 1696   Roxan Hockey M.D on 10/24/2019 at 2:33 PM  Go to [email or web address] - for contact info  [facility] - Office  [phone]

## 2019-10-24 NOTE — Progress Notes (Signed)
Palliative: [name] is lying quietly in bed.  She continues to appear acutely/chronically ill and frail.  She does not respond to me in any meaningful way today.  Family meeting in the hospital Chapel with spouse, daughters [name] from [place], [name] from [place], son [name] who lives locally, son [name] from [place], son [name] from [place], son-in-law [name], and chaplain Joya Gaskins.  We talked about [name]'s chronic health concerns in detail.  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  We talked about what gives [name] pleasure in life such as being with family, and making sweet potato pie.  We talked in detail about the chronic illness pathway related to dementia, including functional declines, mental declines, nutritional declines.  We talked about [name]'s current nutritional status.  Family states that they are not able to get her to eat or drink.  [name] shares that at this point she is no longer hungry.  Kelly asks how long it has been since she is really eaten.  [name] shares that she was not eating well prior to hospitalization, and we talked about her poor by mouth intake in particular since she worsened on Saturday.  We talked about artificial feeding.  Family states that at this point they would not want a tube through the skin of the stomach to feed her.  They also realize that if she is not having a permanent tube there is no benefit to a temporary tube, and she is not eligible to discharge to any rehab with a temporary feeding tube.  We talked about how to make choices for loved ones including 1) keeping them at the center of decision-making, not what we want for them 2) are we doing something for her or to her (can we change what is happening) and 3) the person [name] was 5 years ago, how would that person tell family to care for her now.  We talked about the treatment plan including but not limited to wound  care, IV fluids, IV antibiotics.  We also talked about the concept of "let nature take its course".  I give an example of when this would be appropriate sharing that it is not illegal, nor unethical, but we realize that some people have a moral problem with not doing everything.  We talked about disposition options.  I share that no nursing home would accept her for rehab if she is not eating.  [name] asks where she has been accepted, and I share [facility].  [name] asks about star ratings, stating that he would not want her to go to any facility that had a low star rating.  I gently reminded family that no facility will take her if she is not eating, and has no nutrition source. We talked about hospice care at home and out of facility.  We talked about what comfort care looks like and feels like.  [name] asks about medications that bring death sooner.  We talked about how and why morphine is given.  Thankfully, [name] is experienced, and is able to accurately and concisely answer questions.   [name] states that he is pleased with the care she has received in the hospital, and he would like for her to stay in the hospital.  We have talked about [name] guidelines, qualifications to stay in the hospital.  We also talked about the benefit of residential hospice for comfort and dignity at end-of-life.  We talk about prognosis with permission.  Only God knows, but we expect best case scenario with artificial nutrition would be 8 to 12 weeks based on advancing dementia, bacteremia, wounds.  If they were to focus on comfort and dignity, 2 weeks or less would be expected.  Conference with attending, bedside nursing staff, chaplaincy related to patient condition, needs, goals of care discussion.  Plan: No artificial feeding via PEG or temporary feeding tube.  Considering disposition.  PMT to follow.  120 minutes, extended time Kelly Carmel, NP Palliative Medicine Team Team Phone #  [phone] Greater than 50% of this time was spent counseling and coordinating care related to the above assessment and plan.

## 2019-10-25 DIAGNOSIS — D696 Thrombocytopenia, unspecified: Secondary | ICD-10-CM | POA: Diagnosis present

## 2019-10-25 LAB — GLUCOSE, CAPILLARY
Glucose-Capillary: 110 mg/dL — ABNORMAL HIGH (ref 70–99)
Glucose-Capillary: 112 mg/dL — ABNORMAL HIGH (ref 70–99)
Glucose-Capillary: 113 mg/dL — ABNORMAL HIGH (ref 70–99)
Glucose-Capillary: 115 mg/dL — ABNORMAL HIGH (ref 70–99)
Glucose-Capillary: 117 mg/dL — ABNORMAL HIGH (ref 70–99)
Glucose-Capillary: 117 mg/dL — ABNORMAL HIGH (ref 70–99)
Glucose-Capillary: 120 mg/dL — ABNORMAL HIGH (ref 70–99)

## 2019-10-25 LAB — COMPREHENSIVE METABOLIC PANEL
ALT: 12 U/L (ref 0–44)
AST: 14 U/L — ABNORMAL LOW (ref 15–41)
Albumin: 1.9 g/dL — ABNORMAL LOW (ref 3.5–5.0)
Alkaline Phosphatase: 68 U/L (ref 38–126)
Anion gap: 9 (ref 5–15)
BUN: 52 mg/dL — ABNORMAL HIGH (ref 8–23)
CO2: 16 mmol/L — ABNORMAL LOW (ref 22–32)
Calcium: 7.4 mg/dL — ABNORMAL LOW (ref 8.9–10.3)
Chloride: 113 mmol/L — ABNORMAL HIGH (ref 98–111)
Creatinine, Ser: 1.51 mg/dL — ABNORMAL HIGH (ref 0.44–1.00)
GFR calc Af Amer: 37 mL/min — ABNORMAL LOW (ref >60)
GFR calc non Af Amer: 32 mL/min — ABNORMAL LOW (ref >60)
Glucose, Bld: 117 mg/dL — ABNORMAL HIGH (ref 70–99)
Potassium: 3.9 mmol/L (ref 3.5–5.1)
Sodium: 138 mmol/L (ref 135–145)
Total Bilirubin: 0.5 mg/dL (ref 0.3–1.2)
Total Protein: 5 g/dL — ABNORMAL LOW (ref 6.5–8.1)

## 2019-10-25 LAB — CBC
HCT: 32 % — ABNORMAL LOW (ref 36.0–46.0)
Hemoglobin: 9.9 g/dL — ABNORMAL LOW (ref 12.0–15.0)
MCH: 28.9 pg (ref 26.0–34.0)
MCHC: 30.9 g/dL (ref 30.0–36.0)
MCV: 93.3 fL (ref 80.0–100.0)
Platelets: 38 10*3/uL — ABNORMAL LOW (ref 150–400)
RBC: 3.43 MIL/uL — ABNORMAL LOW (ref 3.87–5.11)
RDW: 17.1 % — ABNORMAL HIGH (ref 11.5–15.5)
WBC: 9.8 10*3/uL (ref 4.0–10.5)
nRBC: 0.2 % (ref 0.0–0.2)

## 2019-10-25 NOTE — Progress Notes (Signed)
Total urine output since 7am today has been . Dr Marisa Severin aware.

## 2019-10-25 NOTE — Progress Notes (Signed)
Patient Demographics:    [name], is a 85 y.o. female, DOB - [birth year], ZDG:[id number]  Admit date - 10/15/2019   Admitting Physician Bethena Roys, MD  Outpatient Primary MD for the patient is [name], MD  LOS - 9  Chief Complaint  Patient presents with   Skin Ulcer        Subjective:    [name] today has no fevers, no emesis,  No chest pain,    -Patient is resting comfortably,  Continues to refuse all oral intake--- No further rectal bleeding--- -stool "balls" removed from Rectum Daughters [name] and [name] visiting -I called and spoke with patient's husband by phone -I called and left a message for patient's son [name]  Assessment  & Plan :    Principal Problem:   Severe sepsis with septic shock - Klebsiella/Proteus/E. coli/Bacteroides and Staph  Active Problems:   S/P ICD (internal cardiac defibrillator) procedure, with upgrade to ICD/CRT-D  [employer]   Cardiomyopathy, ischemic EF 20 to 25 %/history of combined systolic and diastolic dysfunction CHF   Decubitus ulcer of right hip, stage 4 /Infected with E-coli and Proteus   Failure to thrive in adult   Decubitus ulcer of left hip, stage 3/Infected with E-coli and Proteus   Goals of care, counseling/discussion   Hematochezia   Sepsis secondary to Klebsiella UTI ([place])   Anemia due to acute blood loss/rectal bleeding   Type II diabetes mellitus ([place])   AKI (acute kidney injury) ([place])   Thrombocytopenia Acquired   Hypertension   Mobitz type 2 second degree atrioventricular block   Polymorphic ventricular tachycardia (HCC)   Sepsis ([place])   Stage I pressure ulcer of sacral region   Bacteremia due to Gram-negative bacteria   Gram negative sepsis ([place])   Palliative care by specialist   Encounter for hospice care discussion   Brief Summary 85 year old female with a history of advanced Alzheimer's dementia, CAD,  H/o combined systolic and diastolic dysfunction CHF/HFrEF/ischemic cardiomyopathy with EF 20 to 25%, h/o torsades VT status post AICD, H/o HTN, DM2, HLD, admitted on 10/29/2019 from home with metabolic encephalopathy in the setting of presumed infection (leukocytosis and Lactic acid was 3.1) with multiple decubitus ulcers --Able to come off warming blanket on 10/22/19 -Weaned off IV Levophed on 10/22/19 ---Patient still unable to have oral intake due to advanced cognitive deficits--- she will spit food out, clenching her jaw and resist any attempt to feed her  -overall prognosis remains poor due to inability to take oral intake  Assessment/Plan: 1)Severe sepsis with septic shock-- POA -Etiology is multifactorial:-Patient has Klebsiella UTI (urine cx 10/17/19), E. coli and Proteus wound decubitus ulcer/wound infection (wound cx from 11/01/2019 and 10/17/19) and staph epi and Bacteroides bacteremia from blood culture dated 10/28/2019 --Able to come off warming blanket on 10/22/19 -Weaned off IV Levophed on 10/22/19 --Stopped vancomycin after 10/21/2019 -MRSA PCR negative on 10/20/2019 -De-escalate to IV Rocephin and metronidazole on 10/22/2019 Stopped cefepime -Repeat blood culture from 10/21/2019 NGTD -Patient remains encephalopathic, at baseline she does have advanced dementia  with significant cognitive and memory deficits -WBC is down to 9.8  from a peak of 25.6 -PCT was  2.91, ESR 60, CRP 14.1 - overall prognosis remains poor especially given lack of  oral intake  2)Bacteremia--B.fragilis and staph epi--- ???  Significance of blood culture from 10/25/2019 -??  Contaminant, however patient is quite septic --Sepsis pathophysiology and Antibiotics as above #1 -Repeat blood cultures on 10/21/2019 NGTD - 3)Infected Bilateral Hip and Sacral decubitus ulcers--please see photos in epic -10/17/2019 CT abdomen/pelvis--right lateral decubitus ulcer with subcutaneous inflammatory changes extending toward the greater  trochanter. There is no bony destructive changes. There is considerable inflammatory change with air tracking toward the lateral aspect of the femur. -General surgery consultappreciated -10/17/19--bedside debridement -Wound cultures from 11/02/2019 and 10/17/2019 with E. coli and Proteus --Wound culture with fungus reluctant to use Diflucan due to prolonged QT concerns---okay to treat with anidulafungin -Sepsis pathophysiology and Antibiotics as above #1  4) Klebsiella UTI--urine culture from 10/17/2019 noted --Sepsis pathophysiology and Antibiotics as above #1  5)Failure to thrive -Serum B12--199-->supplement -TSH--37.364, Free T4--0.28 -Folate--7.2 -PT evaluation-->SNF -continues to have poor po intake -Cognitive status and encephalopathy makes it difficult to feed orally ---Patient still unable to have oral intake due to advanced cognitive deficits--- she will spit food out, clenching her jaw and resist any attempt to feed her -Continue judicious IV dextrose fluids given that patient's EF is down to 20 to 25% -Ensure supplements refused by patient --Remains very challenging to feed patient as she closes her mouth , clenching her jaw and spits out anything -husband has decided against NG tube/Keofeed Tube and against  PEG/G-tube placement  6)Symptomatic acute on chronic blood loss anemia due to Rectal bleeding-hemoglobin is up  to 9.9 from 6.9 after transfusion of 2 units of PRBC on 10/21/2019 --- Hypothermia has resolved, hypotension resolved -No further rectal bleeding -Protonix for GI prophylaxis GI consult appreciated suspect rectal injury from fecal impaction--suppositories as ordered -No further GI bleed  7)CKD stage 3b -initially felt to be AKI but now more likely progression of CKD -pt likely has progression of underlying CKD -Creatinine hovering below 2 -No recent renal labs available to compare current values to -renal US-->medical renal disease -Creatinine is down to  1.51 from a peak of 1.83 - renally adjust medications, avoid nephrotoxic agents / dehydration  / hypotension  8)H/o combined systolic and diastolic dysfunction CHF/HFrEF/ischemic cardiomyopathy with EF 20 to 25%- -02/2015 echo EF 20-25%, G2DD -BP improved with iVF   - c/n Judicious IV fluids  9)Hypernatremia/dehydration -Resolved with IV fluids, -due to continued poor po intake--will c/n D5 half-normal as ordered  10)Alzheimer's dementia/ambulatory dysfunction -Continue Namenda -spouse states pt screamsfrequently at home -At baseline patient has very advanced dementia with severe cognitive and memory deficits, she has been bed-bound for the last 3 months at least, requiring total care  11)Coronary artery disease -hx of CABG in 2009 (LIMA-LAD, SVG-ramus, SVG-OM). Hx of RCA stent 04/2012 -no chest pain presently Discontinued Aspirin and Plavix due to rectal bleeding with symptomatic Anemia -Continue statin -Coreg on hold due to hypotension  12)Social/Ethics--- overall prognosis is poor, patient is a DNR/DNI -Plan of care, goals of care discussed with patient's husband at bedside, I also called and previously discussed case with patient's son [name] at [phone]--- questions answered - also discussed with patient's daughter Mikle Bosworth who is from [place], and another daughter Shauna Hugh -Official palliative care consult appreciated  13)Thrombocytopenia---  Platelets 206 >> 188>>> 117>> 74>> 48>>> 38   14)Ventricular Tachycardia/H/o Torsades -status post AICD placement - she has CRT-D device followed by EP - she underwent ICD upgrade of single chamber ICD to Hillery Aldo CRT-D  Disposition Plan: Patient From: Home D/C Place:SNF when  medically stable Barriers: Not Clinically Stable--poor po intake, severe bacterial infection requiring IV antibiotics and IV fluids  -Not medically stable for discharge at this time  Code Status : DNR  Procedures-- bedside decubitus ulcer  debridement on 10/17/2019 -Right arm PICC line placement on 10/21/2019 -transfusion of 2 units of PRBC on 10/21/2019 -Bedside decubitus debridement by Dr. Constance Haw on 10/22/2019  Family Communication:   Spouse updatedat bedside - I Previously called and discussed case with patient's son [name] at [phone]--- questions answered  -Discussed with patient daughter Mikle Bosworth  from [place] - D/w Daughters [name] and Mikle Bosworth  -I called and spoke with patient's husband by phone -I called and left a message for patient's son [name] again on 10/25/19  Consults  : Palliative care and general surgery  DVT Prophylaxis  :  Teds and SCDs/rectal bleeding  Lab Results  Component Value Date   PLT 38 (L) 10/25/2019   Inpatient Medications  Scheduled Meds:  atorvastatin  80 mg Oral Daily   Chlorhexidine Gluconate Cloth  6 each Topical Daily   collagenase   Topical BID   feeding supplement (ENSURE ENLIVE)  237 mL Oral TID   folic acid  1 mg Oral Daily   hydrocortisone  25 mg Rectal BID   lactulose  30 g Oral Once   levothyroxine  37.5 mcg Intravenous Q0600   mouth rinse  15 mL Mouth Rinse BID   memantine  10 mg Oral BID   pantoprazole (PROTONIX) IV  40 mg Intravenous Q12H   polyethylene glycol  17 g Oral Daily   sodium chloride flush  10-40 mL Intracatheter Q12H   sodium chloride flush  3 mL Intravenous Q12H   vitamin B-12  500 mcg Oral Daily   Continuous Infusions:  sodium chloride 10 mL/hr at 10/21/19 2018   anidulafungin     cefTRIAXone (ROCEPHIN)  IV 2 g (10/24/19 2100)   dextrose 5 % and 0.45% NaCl 50 mL/hr at 10/25/19 0317   metronidazole 500 mg (10/25/19 0718)   PRN Meds:.sodium chloride, acetaminophen **OR** acetaminophen, fentaNYL (SUBLIMAZE) injection, HYDROcodone-acetaminophen, LORazepam, ondansetron (ZOFRAN) IV, sodium chloride flush, sodium chloride flush   Anti-infectives (From admission, onward)   Start     Dose/Rate Route Frequency Ordered Stop    10/25/19 1500  anidulafungin (ERAXIS) 100 mg in sodium chloride 0.9 % 100 mL IVPB     100 mg 78 mL/hr over 100 Minutes Intravenous Every 24 hours 10/24/19 1728     10/24/19 1400  fluconazole (DIFLUCAN) IVPB 200 mg  Status:  Discontinued     200 mg 100 mL/hr over 60 Minutes Intravenous Every 24 hours 10/23/19 1144 10/24/19 1717   10/23/19 1400  fluconazole (DIFLUCAN) IVPB 400 mg     400 mg 100 mL/hr over 120 Minutes Intravenous  Once 10/23/19 1144 10/23/19 2302   10/22/19 2200  cefTRIAXone (ROCEPHIN) 2 g in sodium chloride 0.9 % 100 mL IVPB     2 g 200 mL/hr over 30 Minutes Intravenous Every 24 hours 10/22/19 1022     10/17/19 2359  vancomycin (VANCOREADY) IVPB 1250 mg/250 mL     1,250 mg 166.7 mL/hr over 90 Minutes Intravenous Every 48 hours 10/15/2019 2022 10/22/19 2222   11/01/2019 2359  metroNIDAZOLE (FLAGYL) IVPB 500 mg     500 mg 100 mL/hr over 60 Minutes Intravenous Every 8 hours 10/18/2019 2047     10/15/2019 2359  ceFEPIme (MAXIPIME) 2 g in sodium chloride 0.9 % 100 mL IVPB  Status:  Discontinued  2 g 200 mL/hr over 30 Minutes Intravenous Every 24 hours 10/31/2019 2012 10/22/19 1022   10/28/2019 1430  vancomycin (VANCOCIN) IVPB 1000 mg/200 mL premix     1,000 mg 200 mL/hr over 60 Minutes Intravenous  Once 10/14/2019 1428 10/20/2019 1713   10/14/2019 1430  piperacillin-tazobactam (ZOSYN) IVPB 3.375 g     3.375 g 100 mL/hr over 30 Minutes Intravenous  Once 11/07/2019 1428 10/29/2019 1607       Objective:   Vitals:   10/25/19 0400 10/25/19 0700 10/25/19 0729 10/25/19 0800  BP: (!) 108/55 (!) 123/46  (!) 114/54  Pulse: 65 65 (!) 58 61  Resp: 11 10 (!) 9 10  Temp: (!) 97.5 F (36.4 C)  (!) 97 F (36.1 C)   TempSrc: Axillary  Axillary   SpO2: 100% 100% 100% 100%  Weight:      Height:        Wt Readings from Last 3 Encounters:  10/22/19 80.2 kg  06/13/19 78.5 kg  10/19/18 73.6 kg    Intake/Output Summary (Last 24 hours) at 10/25/2019 0809 Last data filed at 10/25/2019 0542 Gross  per 24 hour  Intake 3803.09 ml  Output 675 ml  Net 3128.09 ml   Physical Exam  Gen:-Lethargic and incoherent at times  HEENT:- [place].AT, No sclera icterus Neck-Supple Neck,No JVD,.  Lungs-diminished in bases, no wheezing CV- S1, S2 normal, regular, CABG scar, AICD in situ Abd-  +ve B.Sounds, Abd Soft, No tenderness,    Extremity-Some upper extremity edema, pedal pulses present  Psych-severe cognitive and memory deficits  neuro-generalized weakness, no new focal deficits, no tremors MSK-right arm PICC line  Skin---  sacral and bilateral hip decubitus ulcers, please see photos in epic    Data Review:   Micro Results Recent Results (from the past 240 hour(s))  Wound or Superficial Culture     Status: None   Collection Time: 10/13/2019  2:28 PM   Specimen: Wound  Result Value Ref Range Status   Specimen Description   Final    WOUND RIGHT ILIAC Performed at [facility], [address], [place], [place] [address]    Special Requests   Final    Normal Performed at [place] [place], [place] [address]    Gram Stain   Final    RARE WBC PRESENT, PREDOMINANTLY PMN ABUNDANT GRAM NEGATIVE RODS FEW GRAM POSITIVE COCCI RARE GRAM POSITIVE RODS Performed at [facility], [place] [address] [place], [place] [address]    Culture   Final    ABUNDANT ESCHERICHIA COLI ABUNDANT PROTEUS MIRABILIS    Report Status 10/20/2019 FINAL  Final   Organism ID, Bacteria ESCHERICHIA COLI  Final   Organism ID, Bacteria PROTEUS MIRABILIS  Final      Susceptibility   Escherichia coli - MIC*    AMPICILLIN >=32 RESISTANT Resistant     CEFAZOLIN >=64 RESISTANT Resistant     CEFEPIME <=0.12 SENSITIVE Sensitive     CEFTAZIDIME <=1 SENSITIVE Sensitive     CEFTRIAXONE <=0.25 SENSITIVE Sensitive     CIPROFLOXACIN <=0.25 SENSITIVE Sensitive     GENTAMICIN <=1 SENSITIVE Sensitive     IMIPENEM <=0.25 SENSITIVE Sensitive     TRIMETH/SULFA <=20 SENSITIVE Sensitive     AMPICILLIN/SULBACTAM  >=32 RESISTANT Resistant     PIP/TAZO <=4 SENSITIVE Sensitive     * ABUNDANT ESCHERICHIA COLI   Proteus mirabilis - MIC*    AMPICILLIN <=2 SENSITIVE Sensitive     CEFAZOLIN <=4 SENSITIVE Sensitive  CEFEPIME <=0.12 SENSITIVE Sensitive     CEFTAZIDIME <=1 SENSITIVE Sensitive     CEFTRIAXONE <=0.25 SENSITIVE Sensitive     CIPROFLOXACIN <=0.25 SENSITIVE Sensitive     GENTAMICIN <=1 SENSITIVE Sensitive     IMIPENEM 4 SENSITIVE Sensitive     TRIMETH/SULFA <=20 SENSITIVE Sensitive     AMPICILLIN/SULBACTAM <=2 SENSITIVE Sensitive     PIP/TAZO <=4 SENSITIVE Sensitive     * ABUNDANT PROTEUS MIRABILIS  Blood Culture (routine x 2)     Status: Abnormal   Collection Time: 10/12/2019  3:15 PM   Specimen: BLOOD  Result Value Ref Range Status   Specimen Description   Final    BLOOD RIGHT ANTECUBITAL Performed at [facility], [place] [address], [place], [place] [address]    Special Requests   Final    BOTTLES DRAWN AEROBIC AND ANAEROBIC Blood Culture adequate volume Performed at [facility], [address] [place], [place] [address]    Culture  Setup Time   Final    GRAM NEGATIVE RODS Gram Stain Report Called to,Read Back By and Verified With: OLIVER,J ON 10/17/19 AT 1910 BY LOY,C PERFORMED AT [facility] ANAEROBIC BOTTLE GRAM POSITIVE COCCI AEROBIC BOTTLE ONLY Gram Stain Report Called to,Read Back By and Verified With: H EVANS,RN'@[id number]'  10/18/19 MKELLY    Culture (A)  Final    STAPHYLOCOCCUS EPIDERMIDIS THE SIGNIFICANCE OF ISOLATING THIS ORGANISM FROM A SINGLE SET OF BLOOD CULTURES WHEN MULTIPLE SETS ARE DRAWN IS UNCERTAIN. PLEASE NOTIFY THE MICROBIOLOGY DEPARTMENT WITHIN ONE WEEK IF SPECIATION AND SENSITIVITIES ARE REQUIRED. BACTEROIDES FRAGILIS BETA LACTAMASE POSITIVE Performed at [facility], [place] [address], [place], [place] [address]    Report Status 10/19/2019 FINAL  Final  Blood Culture ID Panel (Reflexed)     Status: None   Collection Time: 10/09/2019  3:15 PM  Result Value Ref  Range Status   Enterococcus species NOT DETECTED NOT DETECTED Final   Listeria monocytogenes NOT DETECTED NOT DETECTED Final   Staphylococcus species NOT DETECTED NOT DETECTED Final   Staphylococcus aureus (BCID) NOT DETECTED NOT DETECTED Final   Streptococcus species NOT DETECTED NOT DETECTED Final   Streptococcus agalactiae NOT DETECTED NOT DETECTED Final   Streptococcus pneumoniae NOT DETECTED NOT DETECTED Final   Streptococcus pyogenes NOT DETECTED NOT DETECTED Final   Acinetobacter baumannii NOT DETECTED NOT DETECTED Final   Enterobacteriaceae species NOT DETECTED NOT DETECTED Final   Enterobacter cloacae complex NOT DETECTED NOT DETECTED Final   Escherichia coli NOT DETECTED NOT DETECTED Final   Klebsiella oxytoca NOT DETECTED NOT DETECTED Final   Klebsiella pneumoniae NOT DETECTED NOT DETECTED Final   Proteus species NOT DETECTED NOT DETECTED Final   Serratia marcescens NOT DETECTED NOT DETECTED Final   Haemophilus influenzae NOT DETECTED NOT DETECTED Final   Neisseria meningitidis NOT DETECTED NOT DETECTED Final   Pseudomonas aeruginosa NOT DETECTED NOT DETECTED Final   Candida albicans NOT DETECTED NOT DETECTED Final   Candida glabrata NOT DETECTED NOT DETECTED Final   Candida krusei NOT DETECTED NOT DETECTED Final   Candida parapsilosis NOT DETECTED NOT DETECTED Final   Candida tropicalis NOT DETECTED NOT DETECTED Final    Comment: Performed at [facility], [place] [address], [place], [place] [address]  Respiratory Panel by RT PCR (Flu A&B, Covid) - Nasopharyngeal Swab     Status: None   Collection Time: 10/25/2019  5:00 PM   Specimen: Nasopharyngeal Swab  Result Value Ref Range Status   SARS Coronavirus 2 by RT  PCR NEGATIVE NEGATIVE Final    Comment: (NOTE) SARS-CoV-2 target nucleic acids are NOT DETECTED. The SARS-CoV-2 RNA is generally detectable in upper respiratoy specimens during the acute phase of infection. The lowest concentration of SARS-CoV-2 viral copies  this assay can detect is 131 copies/mL. A negative result does not preclude SARS-Cov-2 infection and should not be used as the sole basis for treatment or other patient management decisions. A negative result may occur with  improper specimen collection/handling, submission of specimen other than nasopharyngeal swab, presence of viral mutation(s) within the areas targeted by this assay, and inadequate number of viral copies (<131 copies/mL). A negative result must be combined with clinical observations, patient history, and epidemiological information. The expected result is Negative. Fact Sheet for Patients:  PinkCheek.be Fact Sheet for Healthcare Providers:  GravelBags.it This test is not yet ap proved or cleared by the [place] FDA and  has been authorized for detection and/or diagnosis of SARS-CoV-2 by FDA under an Emergency Use Authorization (EUA). This EUA will remain  in effect (meaning this test can be used) for the duration of the COVID-19 declaration under Section 564(b)(1) of the Act, 21 U.S.C. section 360bbb-3(b)(1), unless the authorization is terminated or revoked sooner.    Influenza A by PCR NEGATIVE NEGATIVE Final   Influenza B by PCR NEGATIVE NEGATIVE Final    Comment: (NOTE) The Xpert Xpress SARS-CoV-2/FLU/RSV assay is intended as an aid in  the diagnosis of influenza from Nasopharyngeal swab specimens and  should not be used as a sole basis for treatment. Nasal washings and  aspirates are unacceptable for Xpert Xpress SARS-CoV-2/FLU/RSV  testing. Fact Sheet for Patients: PinkCheek.be Fact Sheet for Healthcare Providers: GravelBags.it This test is not yet approved or cleared by the [place] FDA and  has been authorized for detection and/or diagnosis of SARS-CoV-2 by  FDA under an Emergency Use Authorization (EUA). This EUA will remain  in  effect (meaning this test can be used) for the duration of the  Covid-19 declaration under Section 564(b)(1) of the Act, 21  U.S.C. section 360bbb-3(b)(1), unless the authorization is  terminated or revoked. Performed at [facility], [address], [place], [place] [address]   Blood Culture (routine x 2)     Status: None   Collection Time: 10/23/2019  5:13 PM   Specimen: Left Antecubital; Blood  Result Value Ref Range Status   Specimen Description LEFT ANTECUBITAL  Final   Special Requests   Final    BOTTLES DRAWN AEROBIC ONLY Blood Culture results may not be optimal due to an inadequate volume of blood received in culture bottles   Culture   Final    NO GROWTH 5 DAYS Performed at [facility], [address] [place], [place] [address]    Report Status 10/21/2019 FINAL  Final  Urine culture     Status: Abnormal   Collection Time: 10/17/19  2:26 PM   Specimen: In/Out Cath Urine  Result Value Ref Range Status   Specimen Description   Final    IN/OUT CATH URINE Performed at [facility], [address], [place], [place] [address]    Special Requests   Final    NONE Performed at [facility], [address], [place], [place] [address]    Culture 20,000 COLONIES/mL KLEBSIELLA PNEUMONIAE (A)  Final   Report Status 10/20/2019 FINAL  Final   Organism ID, Bacteria KLEBSIELLA PNEUMONIAE (A)  Final      Susceptibility   Klebsiella pneumoniae - MIC*  AMPICILLIN >=32 RESISTANT Resistant     CEFAZOLIN <=4 SENSITIVE Sensitive     CEFTRIAXONE <=0.25 SENSITIVE Sensitive     CIPROFLOXACIN <=0.25 SENSITIVE Sensitive     GENTAMICIN <=1 SENSITIVE Sensitive     IMIPENEM 1 SENSITIVE Sensitive     NITROFURANTOIN 64 INTERMEDIATE Intermediate     TRIMETH/SULFA <=20 SENSITIVE Sensitive     AMPICILLIN/SULBACTAM 8 SENSITIVE Sensitive     PIP/TAZO <=4 SENSITIVE Sensitive     * 20,000 COLONIES/mL KLEBSIELLA PNEUMONIAE  Aerobic/Anaerobic Culture (surgical/deep wound)     Status: None   Collection Time:  10/17/19  6:14 PM   Specimen: Abscess  Result Value Ref Range Status   Specimen Description   Final    ABSCESS Performed at [facility], [address], [place], [place] [address]    Special Requests   Final    RIGHT Performed at [facility], [address] [place], [place] [address]    Gram Stain NO WBC SEEN NO ORGANISMS SEEN   Final   Culture   Final    FEW ESCHERICHIA COLI FEW PROTEUS MIRABILIS MODERATE BACTEROIDES FRAGILIS BETA LACTAMASE POSITIVE Performed at [facility], [place] [address] [place], [place] [address]    Report Status 10/22/2019 FINAL  Final   Organism ID, Bacteria ESCHERICHIA COLI  Final   Organism ID, Bacteria PROTEUS MIRABILIS  Final      Susceptibility   Escherichia coli - MIC*    AMPICILLIN >=32 RESISTANT Resistant     CEFAZOLIN 16 SENSITIVE Sensitive     CEFEPIME <=0.12 SENSITIVE Sensitive     CEFTAZIDIME <=1 SENSITIVE Sensitive     CEFTRIAXONE <=0.25 SENSITIVE Sensitive     CIPROFLOXACIN <=0.25 SENSITIVE Sensitive     GENTAMICIN <=1 SENSITIVE Sensitive     IMIPENEM <=0.25 SENSITIVE Sensitive     TRIMETH/SULFA <=20 SENSITIVE Sensitive     AMPICILLIN/SULBACTAM >=32 RESISTANT Resistant     PIP/TAZO <=4 SENSITIVE Sensitive     * FEW ESCHERICHIA COLI   Proteus mirabilis - MIC*    AMPICILLIN <=2 SENSITIVE Sensitive     CEFAZOLIN <=4 SENSITIVE Sensitive     CEFEPIME <=0.12 SENSITIVE Sensitive     CEFTAZIDIME <=1 SENSITIVE Sensitive     CEFTRIAXONE <=0.25 SENSITIVE Sensitive     CIPROFLOXACIN <=0.25 SENSITIVE Sensitive     GENTAMICIN <=1 SENSITIVE Sensitive     IMIPENEM 4 SENSITIVE Sensitive     TRIMETH/SULFA <=20 SENSITIVE Sensitive     AMPICILLIN/SULBACTAM <=2 SENSITIVE Sensitive     PIP/TAZO <=4 SENSITIVE Sensitive     * FEW PROTEUS MIRABILIS  Aerobic/Anaerobic Culture (surgical/deep wound)     Status: Abnormal   Collection Time: 10/17/19  6:14 PM   Specimen: Abscess  Result Value Ref Range Status   Specimen Description   Final     ABSCESS Performed at [facility], [address] [place], [place] [address]    Special Requests   Final    LEFT Performed at [facility], [address] [place], [place] [address]    Gram Stain   Final    FEW WBC PRESENT,BOTH PMN AND MONONUCLEAR RARE GRAM VARIABLE ROD RARE GRAM POSITIVE COCCI RARE YEAST    Culture (A)  Final    MULTIPLE ORGANISMS PRESENT, NONE PREDOMINANT FEW CANDIDA LUSITANIAE NO STAPHYLOCOCCUS AUREUS ISOLATED NO GROUP A STREP (S.PYOGENES) ISOLATED MODERATE BACTEROIDES FRAGILIS POSITIVE Performed at [facility], [place] [address] [place], [place] [address]    Report Status 10/22/2019 FINAL  Final  MRSA PCR Screening     Status: None   Collection Time: 10/20/19  4:57 PM   Specimen: Nasal Mucosa; Nasopharyngeal  Result Value Ref Range Status   MRSA by PCR NEGATIVE NEGATIVE Final    Comment:        The GeneXpert MRSA Assay (FDA approved for NASAL specimens only), is one component of a comprehensive MRSA colonization surveillance program. It is not intended to diagnose MRSA infection nor to guide or monitor treatment for MRSA infections. Performed at [facility], [address], [place], [place] [address]   Culture, blood (Routine X 2) w Reflex to ID Panel     Status: None (Preliminary result)   Collection Time: 10/21/19  6:10 PM   Specimen: A-Line; Blood  Result Value Ref Range Status   Specimen Description A-LINE  Final   Special Requests   Final    BOTTLES DRAWN AEROBIC AND ANAEROBIC Blood Culture adequate volume   Culture   Final    NO GROWTH 4 DAYS Performed at [facility], [address] [place], [place] [address]    Report Status PENDING  Incomplete  Culture, blood (Routine X 2) w Reflex to ID Panel     Status: None (Preliminary result)   Collection Time: 10/22/19  4:34 AM   Specimen: BLOOD  Result Value Ref Range Status   Specimen Description BLOOD SITE NOT SPECIFIED  Final   Special Requests   Final    BOTTLES DRAWN AEROBIC AND  ANAEROBIC Blood Culture adequate volume   Culture   Final    NO GROWTH 3 DAYS Performed at [facility], [address] [place], [place] [address]    Report Status PENDING  Incomplete    Radiology Reports CT ABDOMEN PELVIS WO CONTRAST  Result Date: 11/04/2019 CLINICAL DATA:  Decubitus ulcers EXAM: CT ABDOMEN AND PELVIS WITHOUT CONTRAST TECHNIQUE: Multidetector CT imaging of the abdomen and pelvis was performed following the standard protocol without IV contrast. COMPARISON:  None. FINDINGS: Lower chest: No acute abnormality. Hepatobiliary: Scattered calcifications are noted within the liver. Gallbladder is well distended with multiple dependent gallstones. Pancreas: Unremarkable. No pancreatic ductal dilatation or surrounding inflammatory changes. Spleen: Normal in size without focal abnormality. Adrenals/Urinary Tract: Adrenal glands are within normal limits. Kidneys are well visualized bilaterally with renal vascular calcifications. No renal stones are seen. No obstructive changes are noted. The bladder is partially distended. Stomach/Bowel: The appendix is not well visualized although no inflammatory changes to suggest appendicitis are noted. Retained fecal material is noted throughout the colon consistent with a mild degree of constipation without obstructive change. No small bowel abnormality is seen. Stomach is unremarkable. Vascular/Lymphatic: Aortic atherosclerosis. No enlarged abdominal or pelvic lymph nodes. Reproductive: Uterus and bilateral adnexa are unremarkable. Other: No abdominal wall hernia or abnormality. No abdominopelvic ascites. Musculoskeletal: Mild changes of anasarca are noted. Decubitus ulcer is noted laterally near the right hip with inflammatory changes extending from the skin towards the greater trochanter. This area measures approximately 7.6 x 4.3 cm. No definitive bony erosive changes to suggest osteomyelitis are noted at this time. No other definitive decubitus ulcer is  seen. Degenerative changes of lumbar spine are seen. IMPRESSION: Right lateral decubitus ulcer with subcutaneous inflammatory changes extending towards the greater trochanter of the right proximal femur. No definitive bony destructive changes are seen. Considerable subcutaneous inflammatory changes noted with air tracking towards the lateral aspect of the femur. Changes of mild constipation. Cholelithiasis without complicating factors. Electronically Signed   By: Inez Catalina  M.D.   On: 10/30/2019 16:51   US RENAL  Result Date: 10/18/2019 CLINICAL DATA:  Acute renal insufficiency. EXAM: RENAL / URINARY TRACT ULTRASOUND COMPLETE COMPARISON:  10/29/2019 CT FINDINGS: Right Kidney: Renal measurements: 9.3 x 4.4 x 5.8 cm = volume: 123 mL. Increased renal echogenicity. No hydronephrosis. Left Kidney: Renal measurements: 9.6 x 5.2 x 4.6 cm = volume: 119 mL. Increased renal echogenicity. No hydronephrosis. Bladder: Decompressed. Other: Trace perinephric edema/fluid likely relates to renal insufficiency. Note is made of gallbladder sludge and stones including at 1.0 cm. IMPRESSION: 1. No hydronephrosis. Increased renal echogenicity, consistent with medical renal disease. 2. Cholelithiasis without acute cholecystitis. Electronically Signed   By: Abigail [name] M.D.   On: 10/18/2019 11:51   DG Chest Port 1 View  Result Date: 10/31/2019 CLINICAL DATA:  Sepsis EXAM: PORTABLE CHEST 1 VIEW COMPARISON:  11/05/2014 FINDINGS: Stable positioning of a left-sided implanted cardiac device. Post CABG changes. Stable cardiomediastinal contours. No focal airspace consolidation, pleural effusion, or pneumothorax. IMPRESSION: No acute cardiopulmonary findings. Electronically Signed   By: Davina Poke D.O.   On: 10/18/2019 14:47   [place] EKG SITE RITE  Result Date: 10/20/2019 If Site Rite image not attached, placement could not be confirmed due to current cardiac rhythm.   CBC Recent Labs  Lab 10/21/19 1633 10/22/19 0949  10/23/19 0855 10/24/19 0842 10/25/19 0518  WBC 19.8* 12.5* 9.2 8.1 9.8  HGB 6.9* 10.3* 10.1* 9.7* 9.9*  HCT 22.6* 32.6* 31.6* 30.9* 32.0*  PLT 188 117* 74* 48* 38*  MCV 97.0 93.4 93.8 93.1 93.3  MCH 29.6 29.5 30.0 29.2 28.9  MCHC 30.5 31.6 32.0 31.4 30.9  RDW 15.6* 16.1* 16.6* 16.8* 17.1*    Chemistries  Recent Labs  Lab 10/21/19 1633 10/21/19 1633 10/22/19 0949 10/23/19 0417 10/23/19 0855 10/24/19 0842 10/25/19 0518  NA 137  --  138  --  139 139 138  K 4.1  --  3.8  --  3.6 3.4* 3.9  CL 112*  --  111  --  113* 113* 113*  CO2 17*  --  15*  --  16* 17* 16*  GLUCOSE 125*  --  141*  --  133* 127* 117*  BUN 57*  --  57*  --  56* 55* 52*  CREATININE 1.72*   < > 1.68* 1.64* 1.64* 1.56* 1.51*  CALCIUM 6.7*  --  6.8*  --  6.8* 7.1* 7.4*  MG 1.9  --   --   --   --   --   --   AST  --   --  19  --   --   --  14*  ALT  --   --  13  --   --   --  12  ALKPHOS  --   --  85  --   --   --  68  BILITOT  --   --  1.0  --   --   --  0.5   < > = values in this interval not displayed.   ------------------------------------------------------------------------------------------------------------------ No results for input(s): CHOL, HDL, LDLCALC, TRIG, CHOLHDL, LDLDIRECT in the last 72 hours.  Lab Results  Component Value Date   HGBA1C 5.5 10/19/2019   ------------------------------------------------------------------------------------------------------------------ No results for input(s): TSH, T4TOTAL, T3FREE, THYROIDAB in the last 72 hours.  Invalid input(s): FREET3 ------------------------------------------------------------------------------------------------------------------ No results for input(s): VITAMINB12, FOLATE, FERRITIN, TIBC, IRON, RETICCTPCT in the last 72 hours.  Coagulation profile No results for input(s): INR, PROTIME in the  last 168 hours.  No results for input(s): DDIMER in the last 72 hours.  Cardiac Enzymes No results for input(s): CKMB, TROPONINI, MYOGLOBIN  in the last 168 hours.  Invalid input(s): CK ------------------------------------------------------------------------------------------------------------------    Component Value Date/Time   BNP 583.7 (H) 11/01/2014 2979   Roxan Hockey M.D on 10/25/2019 at 8:09 AM  Go to [email or web address] - for contact info  [facility] - Office  [phone]

## 2019-10-25 NOTE — Progress Notes (Signed)
Palliative: Mrs. Prohaska remains acutely/chronically ill and frail.  She continues to decline by mouth food or drink.  Extensive and lengthy discussion with family 3/17.  The medical team is giving family time to process information and consider choices for Mrs. Cabler.  Detailed discussions with attending, bedside nursing staff related to patient condition, needs, goals of care discussions, psychosocial support.  Plan: Giving family time for decision-making.  At this point family declines artificial feeding, no PEG.  Remains DNR.  Prognosis: If family chooses to focus on comfort only, 2 weeks or less expected.  With tube feeding and more aggressive care, 2 months would be expected.  15 minutes [name], NP Palliative Medicine Team Team Phone # [phone] Greater than 50% of this time was spent counseling and coordinating care related to the above assessment and plan.

## 2019-10-25 NOTE — Progress Notes (Signed)
Patient alert at times, opening eyes to when spoken to. Patient moaned/mumbled a couple of times but could not understand what she was saying. Dr Marisa Severin and writer attempted to encourage patient to eat/drink but patient continues to refuse by turning her head away and clamping her mouth closed and shaking her head no at times. Daughters in to visit and also tried to get patient to drink some water to no avail. Patient transferred to a size-wise bed today. Daughters educated about the bed. Daughter expressed no complaints and all questions answered, emotional support provided.

## 2019-10-25 NOTE — Progress Notes (Signed)
[facility]  Wounds on right and left hip examined. Some fibrinous tissue at base, right superior wound with exposed bone. Continued drainage.   No further surgical debridement at this time, santyl and BID dressing changes.    These will be chronic wounds that will not heal. I have explained this to the husband multiple times.  With no intake will worsen.   Algis Greenhouse, MD [facility] [address] [place], [place] [address] [phone] (office)

## 2019-10-26 LAB — GLUCOSE, CAPILLARY
Glucose-Capillary: 135 mg/dL — ABNORMAL HIGH (ref 70–99)
Glucose-Capillary: 110 mg/dL — ABNORMAL HIGH (ref 70–99)
Glucose-Capillary: 109 mg/dL — ABNORMAL HIGH (ref 70–99)
Glucose-Capillary: 102 mg/dL — ABNORMAL HIGH (ref 70–99)
Glucose-Capillary: 104 mg/dL — ABNORMAL HIGH (ref 70–99)

## 2019-10-26 LAB — CULTURE, BLOOD (ROUTINE X 2)
Culture: NO GROWTH
Special Requests: ADEQUATE

## 2019-10-26 MED ORDER — PANTOPRAZOLE SODIUM 40 MG IV SOLR
40.0000 mg | INTRAVENOUS | Status: DC
  Administered 2019-10-27 – 2019-11-16 (×22): 40 mg via INTRAVENOUS
  Filled 2019-10-26 (×21): qty 40

## 2019-10-26 NOTE — Progress Notes (Signed)
Patient has had no urine output charted for 10/26/19.  Bladder scanned patient and scan showed 800 cc.  On call MD notified and patient in and out cath per order.  In and out resulted in 600 cc of blood tinged urine.  Wound dressings on bilateral hips changed per order.  Patient currently resting comfortably in room.  Will continue to monitor.

## 2019-10-26 NOTE — Progress Notes (Signed)
Spoke with Dr Shon Hale regarding patient transfer and need for vitals every 4 hours. MD is requesting vitals be done routinely every 8 hours and not follow the new guideline of every 4 for 12 hours.

## 2019-10-26 NOTE — TOC Progression Note (Signed)
Transition of Care [facility]) - Progression Note   Patient Details  Name: [name] MRN: [id number] Date of Birth: [birth year]  Transition [facility] CM/SW Contact  Ewing Schlein, LCSW Phone Number: 10/26/2019, 2:09 PM  Clinical Narrative: Spoke with physician regarding hospice services. Physician informed CSW the family was made aware of hospice services and that these would enable the patient to feel more comfortable at home or in a hospice facility. However, after speaking with the husband and children the family has decided they do not want hospice services and want the patient to die in the hospital. CSW placed consult for chaplain.  Expected Discharge Plan: Home w Home Health Services Barriers to Discharge: Family Issues, Continued Medical Work up  Expected Discharge Plan and Services Expected Discharge Plan: Home w Home Health Services   Discharge Planning Services: CM Consult   Living arrangements for the past 2 months: Single Family Home                 HH Arranged: RN, PT, Social Work [employer] Agency: [facility] Date [facility] Agency Contacted: 10/19/19 Time HH Agency Contacted: 1302 Representative spoke with at [facility] Agency: Faxed  Readmission Risk Interventions No flowsheet data found.

## 2019-10-26 NOTE — Care Management Important Message (Signed)
Important Message  Patient Details  Name: [name] MRN: [id number] Date of Birth: 08/22/1934   Medicare Important Message Given:  Yes     Corey Harold 10/26/2019, 4:10 PM

## 2019-10-26 NOTE — Plan of Care (Signed)
  Problem: Nutrition: Goal: Adequate nutrition will be maintained Outcome: Not Progressing   

## 2019-10-26 NOTE — Progress Notes (Signed)
Patient Demographics:    [name], is a 85 y.o. female, DOB - Feb 03, 1935, LNZ:[id number]  Admit date - 11/02/2019   Admitting Physician Ejiroghene Arlyce Dice, MD  Outpatient Primary MD for the patient is [name], MD  LOS - 10  Chief Complaint  Patient presents with  . Skin Ulcer        Subjective:    [name] today has no fevers, no emesis,  No chest pain,    -Patient is resting comfortably,  Continues to refuse all oral intake--- No further rectal bleeding--- -discussed plan of care and answered questions from patient's husband who is visiting at bedside -Patient's husband and patient's kids request that patient not be discharged from the hospital , that the patient be allowed to stay in the hospital as long as possible, patient's husband and patient's daughters state that if patient dies they would prefer that she dies in the hospital  Assessment  & Plan :    Principal Problem:   Severe sepsis with septic shock - Klebsiella/Proteus/E. coli/Bacteroides and Staph  Active Problems:   S/P ICD (internal cardiac defibrillator) procedure, with upgrade to ICD/CRT-D  [employer]   Cardiomyopathy, ischemic EF 20 to 25 %/history of combined systolic and diastolic dysfunction CHF   Decubitus ulcer of right hip, stage 4 /Infected with E-coli and Proteus   Failure to thrive in adult   Decubitus ulcer of left hip, stage 3/Infected with E-coli and Proteus   Goals of care, counseling/discussion   Hematochezia   Sepsis secondary to Klebsiella UTI ([name])   Anemia due to acute blood loss/rectal bleeding   Type II diabetes mellitus ([name])   AKI (acute kidney injury) ([name])   Thrombocytopenia Acquired   Hypertension   Mobitz type 2 second degree atrioventricular block   Polymorphic ventricular tachycardia (HCC)   Sepsis ([place])   Stage I pressure ulcer of sacral region   Bacteremia due to Gram-negative  bacteria   Gram negative sepsis ([name])   Palliative care by specialist   Encounter for hospice care discussion   Brief Summary 85 year old female with a history of advanced Alzheimer's dementia, CAD, H/o combined systolic and diastolic dysfunction CHF/HFrEF/ischemic cardiomyopathy with EF 20 to 25%, h/o torsades VT status post AICD, H/o HTN, DM2, HLD, admitted on 10/21/2019 from home with metabolic encephalopathy in the setting of presumed infection (leukocytosis and Lactic acid was 3.1) with multiple decubitus ulcers --Able to come off warming blanket on 10/22/19 -Weaned off IV Levophed on 10/22/19 ---Patient still unable to have oral intake due to advanced cognitive deficits--- she will spit food out, clenching her jaw and resist any attempt to feed her  -overall prognosis remains poor due to inability to take oral intake  Assessment/Plan: 1)Severe sepsis with septic shock-- POA -Etiology is multifactorial:-Patient has Klebsiella UTI (urine cx 10/17/19), E. coli and Proteus wound decubitus ulcer/wound infection (wound cx from 10/28/2019 and 10/17/19) and staph epi and Bacteroides bacteremia from blood culture dated 10/25/2019 --Able to come off warming blanket on 10/22/19 -Weaned off IV Levophed on 10/22/19 --Stopped vancomycin after 10/21/2019 -MRSA PCR negative on 10/20/2019 -De-escalate to IV Rocephin and metronidazole on 10/22/2019 Stopped cefepime -Repeat blood culture from 10/21/2019 NGTD -Patient remains encephalopathic, at baseline she does have advanced dementia  with significant cognitive and memory deficits -WBC is down to 9.8  from a peak of 25.6 -PCT was  2.91, ESR 60, CRP 14.1 - overall prognosis remains poor especially given lack of oral intake -Antimicrobials this admission: - Diflucan 3/16 and 3/17  -Ceftriaxone 10/22/2019 through 10/28/2019 inclusive  --metronidazole -10/11/2019 through 10/26/2019 inclusive  --zosyn x 1 on 10/28/2019 -- cefepime 10/15/2019 through 10/22/19 -Vanc --10/13/2019  through 10/22/19 -Eraxis -10/25/2019 through 10/28/2019 inclusive  2)Bacteremia--B.fragilis and staph epi--- ???  Significance of blood culture from 10/12/2019 -??  Contaminant, however patient was septic --Sepsis pathophysiology and Antibiotics as above #1 -Repeat blood cultures on 10/21/2019 NGTD - 3)Infected Bilateral Hip and Sacral decubitus ulcers--please see photos in epic -10/20/2019 CT abdomen/pelvis--right lateral decubitus ulcer with subcutaneous inflammatory changes extending toward the greater trochanter. There is no bony destructive changes. There is considerable inflammatory change with air tracking toward the lateral aspect of the femur. -General surgery consultappreciated -10/17/19--bedside debridement -Wound cultures from 10/15/2019 and 10/17/2019 with E. coli and Proteus --Wound culture with fungus reluctant to use Diflucan due to prolonged QT concerns---okay to treat with anidulafungin -Sepsis pathophysiology and Antibiotics as above #1  4) Klebsiella UTI--urine culture from 10/17/2019 noted --Sepsis pathophysiology and Antibiotics as above #1  5)Failure to thrive -Serum B12--199-->supplement -TSH--37.364, Free T4--0.28 -Folate--7.2 -PT evaluation-->SNF -continues to have poor po intake -Cognitive status and encephalopathy makes it difficult to feed orally ---Patient still unable to have oral intake due to advanced cognitive deficits--- she will spit food out, clenching her jaw and resist any attempt to feed her -Continue judicious IV dextrose fluids given that patient's EF is down to 20 to 25% -Ensure supplements refused by patient --Remains very challenging to feed patient as she closes her mouth , clenching her jaw and spits out anything -husband has decided against NG tube/Keofeed Tube and against  PEG/G-tube placement  6)Symptomatic acute on chronic blood loss anemia due to Rectal bleeding-hemoglobin is up  to 9.9 from 6.9 after transfusion of 2 units of PRBC on  10/21/2019 --- Hypothermia has resolved, hypotension resolved -No further rectal bleeding -Protonix for GI prophylaxis GI consult appreciated suspect rectal injury from fecal impaction--suppositories as ordered -No further GI bleed  7)CKD stage 3b -initially felt to be AKI but now more likely progression of CKD -pt likely has progression of underlying CKD -Creatinine hovering below 2 -No recent renal labs available to compare current values to -renal US-->medical renal disease -Creatinine is down to 1.51 from a peak of 1.83 - renally adjust medications, avoid nephrotoxic agents / dehydration  / hypotension  8)H/o combined systolic and diastolic dysfunction CHF/HFrEF/ischemic cardiomyopathy with EF 20 to 25%- -02/2015 echo EF 20-25%, G2DD -BP improved with iVF   - c/n Judicious IV fluids  9)Hypernatremia/dehydration -Resolved with IV fluids, -due to continued poor po intake--will c/n D5 half-normal as ordered  10)Alzheimer's dementia/ambulatory dysfunction -Continue Namenda -spouse states pt screamsfrequently at home -At baseline patient has very advanced dementia with severe cognitive and memory deficits, she has been bed-bound for the last 3 months at least, requiring total care  11)Coronary artery disease -hx of CABG in 2009 (LIMA-LAD, SVG-ramus, SVG-OM). Hx of RCA stent 04/2012 -no chest pain presently Discontinued Aspirin and Plavix due to rectal bleeding with symptomatic Anemia -Continue statin -Coreg on hold due to hypotension  12)Social/Ethics--- overall prognosis is poor, patient is a DNR/DNI -Plan of care, goals of care discussed with patient's husband at bedside, I also called and previously discussed case with patient's son [name] at  [phone]--- questions answered - also discussed with patient's daughter Mikle Bosworth who is from [place], and another daughter Shauna Hugh -Official palliative care consult appreciated -Family declined comfort care and hospice at  this time --Patient's husband and patient's kids request that patient not be discharged from the hospital , that the patient be allowed to stay in the hospital as long as possible, patient's husband and patient's daughters state that if patient dies they would prefer that she dies in the hospital  13)Thrombocytopenia---  Platelets 206 >> 188>>> 117>> 74>> 48>>> 38   14)Ventricular Tachycardia/H/o Torsades -status post AICD placement - she has CRT-D device followed by EP - she underwent ICD upgrade of single chamber ICD to [employer] CRT-D  Disposition Plan: Patient From: Home D/C Place:SNF when medically stable Barriers: Not Clinically Stable--poor po intake, severe bacterial infection requiring IV antibiotics and IV fluids  -Not medically stable for discharge at this time  Code Status : DNR  Procedures-- bedside decubitus ulcer debridement on 10/17/2019 -Right arm PICC line placement on 10/21/2019 -transfusion of 2 units of PRBC on 10/21/2019 -Bedside decubitus debridement by Dr. Constance Haw on 10/22/2019  Family Communication:   Spouse updatedat bedside - I Previously called and discussed case with patient's son [name] at [phone]--- questions answered  -Discussed with patient daughter Mikle Bosworth  from [place] - D/w Daughters [name] and Mikle Bosworth  -I called and spoke with patient's husband by phone -I called and left a message for patient's son [name] again on 10/25/19  Consults  : Palliative care and general surgery  DVT Prophylaxis  :  Teds and SCDs/rectal bleeding  Lab Results  Component Value Date   PLT 38 (L) 10/25/2019   Inpatient Medications  Scheduled Meds: . Chlorhexidine Gluconate Cloth  6 each Topical Daily  . collagenase   Topical BID  . feeding supplement (ENSURE ENLIVE)  237 mL Oral TID  . hydrocortisone  25 mg Rectal BID  . levothyroxine  37.5 mcg Intravenous Q0600  . mouth rinse  15 mL Mouth Rinse BID  . [START ON 10/27/2019] pantoprazole  (PROTONIX) IV  40 mg Intravenous Q24H  . polyethylene glycol  17 g Oral Daily  . sodium chloride flush  10-40 mL Intracatheter Q12H  . sodium chloride flush  3 mL Intravenous Q12H   Continuous Infusions: . sodium chloride 10 mL/hr at 10/21/19 2018  . anidulafungin 100 mg (10/26/19 1525)  . cefTRIAXone (ROCEPHIN)  IV 2 g (10/25/19 2100)  . dextrose 5 % and 0.45% NaCl 50 mL/hr at 10/26/19 1524   PRN Meds:.sodium chloride, acetaminophen **OR** acetaminophen, fentaNYL (SUBLIMAZE) injection, LORazepam, ondansetron (ZOFRAN) IV, sodium chloride flush, sodium chloride flush   Anti-infectives (From admission, onward)   Start     Dose/Rate Route Frequency Ordered Stop   10/25/19 1500  anidulafungin (ERAXIS) 100 mg in sodium chloride 0.9 % 100 mL IVPB     100 mg 78 mL/hr over 100 Minutes Intravenous Every 24 hours 10/24/19 1728 10/28/19 2359   10/24/19 1400  fluconazole (DIFLUCAN) IVPB 200 mg  Status:  Discontinued     200 mg 100 mL/hr over 60 Minutes Intravenous Every 24 hours 10/23/19 1144 10/24/19 1717   10/23/19 1400  fluconazole (DIFLUCAN) IVPB 400 mg     400 mg 100 mL/hr over 120 Minutes Intravenous  Once 10/23/19 1144 10/23/19 2302   10/22/19 2200  cefTRIAXone (ROCEPHIN) 2 g in sodium chloride 0.9 % 100 mL IVPB     2 g 200 mL/hr over 30 Minutes Intravenous Every 24 hours 10/22/19  1022 10/28/19 2359   10/17/19 2359  vancomycin (VANCOREADY) IVPB 1250 mg/250 mL     1,250 mg 166.7 mL/hr over 90 Minutes Intravenous Every 48 hours 10/31/2019 2022 10/22/19 2222   10/12/2019 2359  metroNIDAZOLE (FLAGYL) IVPB 500 mg  Status:  Discontinued     500 mg 100 mL/hr over 60 Minutes Intravenous Every 8 hours 10/31/2019 2047 10/26/19 1604   10/24/2019 2359  ceFEPIme (MAXIPIME) 2 g in sodium chloride 0.9 % 100 mL IVPB  Status:  Discontinued     2 g 200 mL/hr over 30 Minutes Intravenous Every 24 hours 10/25/2019 2012 10/22/19 1022   10/27/2019 1430  vancomycin (VANCOCIN) IVPB 1000 mg/200 mL premix     1,000  mg 200 mL/hr over 60 Minutes Intravenous  Once 10/23/2019 1428 10/27/2019 1713   11/02/2019 1430  piperacillin-tazobactam (ZOSYN) IVPB 3.375 g     3.375 g 100 mL/hr over 30 Minutes Intravenous  Once 10/15/2019 1428 11/06/2019 1607       Objective:   Vitals:   10/26/19 1200 10/26/19 1300 10/26/19 1400 10/26/19 1500  BP: (!) 119/48 (!) 126/54 121/63 113/66  Pulse: (!) 57 60 61 78  Resp: (!) 8 (!) '9 13 19  ' Temp: (!) 97.4 F (36.3 C)     TempSrc:      SpO2: 100% 100% 100% 100%  Weight:      Height:        Wt Readings from Last 3 Encounters:  10/22/19 80.2 kg  06/13/19 78.5 kg  10/19/18 73.6 kg    Intake/Output Summary (Last 24 hours) at 10/26/2019 1913 Last data filed at 10/26/2019 8119 Gross per 24 hour  Intake --  Output 400 ml  Net -400 ml   Physical Exam  Gen:-Lethargic and incoherent at times  HEENT:- [place].AT, No sclera icterus Neck-Supple Neck,No JVD,.  Lungs-diminished in bases, no wheezing CV- S1, S2 normal, regular, CABG scar, AICD in situ Abd-  +ve B.Sounds, Abd Soft, No tenderness,    Extremity-Some upper extremity edema, pedal pulses present  Psych-severe cognitive and memory deficits, nonverbal neuro-generalized weakness, no new focal deficits, no tremors MSK-right arm PICC line  Skin---  sacral and bilateral hip decubitus ulcers, please see photos in epic    Data Review:   Micro Results Recent Results (from the past 240 hour(s))  Urine culture     Status: Abnormal   Collection Time: 10/17/19  2:26 PM   Specimen: In/Out Cath Urine  Result Value Ref Range Status   Specimen Description   Final    IN/OUT CATH URINE Performed at [facility], [address] [place], [place] [address]    Special Requests   Final    NONE Performed at [facility], [address] [place], [place] [address]    Culture 20,000 COLONIES/mL KLEBSIELLA PNEUMONIAE (A)  Final   Report Status 10/20/2019 FINAL  Final   Organism ID, Bacteria KLEBSIELLA PNEUMONIAE (A)  Final       Susceptibility   Klebsiella pneumoniae - MIC*    AMPICILLIN >=32 RESISTANT Resistant     CEFAZOLIN <=4 SENSITIVE Sensitive     CEFTRIAXONE <=0.25 SENSITIVE Sensitive     CIPROFLOXACIN <=0.25 SENSITIVE Sensitive     GENTAMICIN <=1 SENSITIVE Sensitive     IMIPENEM 1 SENSITIVE Sensitive     NITROFURANTOIN 64 INTERMEDIATE Intermediate     TRIMETH/SULFA <=20 SENSITIVE Sensitive     AMPICILLIN/SULBACTAM 8 SENSITIVE Sensitive     PIP/TAZO <=4 SENSITIVE Sensitive     * 20,000  COLONIES/mL KLEBSIELLA PNEUMONIAE  Aerobic/Anaerobic Culture (surgical/deep wound)     Status: None   Collection Time: 10/17/19  6:14 PM   Specimen: Abscess  Result Value Ref Range Status   Specimen Description   Final    ABSCESS Performed at [facility], [address], [place], [place] [address]    Special Requests   Final    RIGHT Performed at [facility], [address] [place], [place] [address]    Gram Stain NO WBC SEEN NO ORGANISMS SEEN   Final   Culture   Final    FEW ESCHERICHIA COLI FEW PROTEUS MIRABILIS MODERATE BACTEROIDES FRAGILIS BETA LACTAMASE POSITIVE Performed at [facility], [place] [address] [place], [place] [address]    Report Status 10/22/2019 FINAL  Final   Organism ID, Bacteria ESCHERICHIA COLI  Final   Organism ID, Bacteria PROTEUS MIRABILIS  Final      Susceptibility   Escherichia coli - MIC*    AMPICILLIN >=32 RESISTANT Resistant     CEFAZOLIN 16 SENSITIVE Sensitive     CEFEPIME <=0.12 SENSITIVE Sensitive     CEFTAZIDIME <=1 SENSITIVE Sensitive     CEFTRIAXONE <=0.25 SENSITIVE Sensitive     CIPROFLOXACIN <=0.25 SENSITIVE Sensitive     GENTAMICIN <=1 SENSITIVE Sensitive     IMIPENEM <=0.25 SENSITIVE Sensitive     TRIMETH/SULFA <=20 SENSITIVE Sensitive     AMPICILLIN/SULBACTAM >=32 RESISTANT Resistant     PIP/TAZO <=4 SENSITIVE Sensitive     * FEW ESCHERICHIA COLI   Proteus mirabilis - MIC*    AMPICILLIN <=2 SENSITIVE Sensitive     CEFAZOLIN <=4 SENSITIVE Sensitive      CEFEPIME <=0.12 SENSITIVE Sensitive     CEFTAZIDIME <=1 SENSITIVE Sensitive     CEFTRIAXONE <=0.25 SENSITIVE Sensitive     CIPROFLOXACIN <=0.25 SENSITIVE Sensitive     GENTAMICIN <=1 SENSITIVE Sensitive     IMIPENEM 4 SENSITIVE Sensitive     TRIMETH/SULFA <=20 SENSITIVE Sensitive     AMPICILLIN/SULBACTAM <=2 SENSITIVE Sensitive     PIP/TAZO <=4 SENSITIVE Sensitive     * FEW PROTEUS MIRABILIS  Aerobic/Anaerobic Culture (surgical/deep wound)     Status: Abnormal   Collection Time: 10/17/19  6:14 PM   Specimen: Abscess  Result Value Ref Range Status   Specimen Description   Final    ABSCESS Performed at [facility], [address] [place], [place] [address]    Special Requests   Final    LEFT Performed at [facility], [address], [place], [place] [address]    Gram Stain   Final    FEW WBC PRESENT,BOTH PMN AND MONONUCLEAR RARE GRAM VARIABLE ROD RARE GRAM POSITIVE COCCI RARE YEAST    Culture (A)  Final    MULTIPLE ORGANISMS PRESENT, NONE PREDOMINANT FEW CANDIDA LUSITANIAE NO STAPHYLOCOCCUS AUREUS ISOLATED NO GROUP A STREP (S.PYOGENES) ISOLATED MODERATE BACTEROIDES FRAGILIS POSITIVE Performed at [facility], [place] [address] [place],  [address]    Report Status 10/22/2019 FINAL  Final  MRSA PCR Screening     Status: None   Collection Time: 10/20/19  4:57 PM   Specimen: Nasal Mucosa; Nasopharyngeal  Result Value Ref Range Status   MRSA by PCR NEGATIVE NEGATIVE Final    Comment:        The GeneXpert MRSA Assay (FDA approved for NASAL specimens only), is one component of a comprehensive MRSA colonization surveillance program. It is not intended to diagnose MRSA infection nor to guide or monitor treatment for  MRSA infections. Performed at [facility], [address], [place], [place] [address]   Culture, blood (Routine X 2) w Reflex to ID Panel     Status: None   Collection Time: 10/21/19  6:10 PM   Specimen: A-Line; Blood  Result Value Ref Range  Status   Specimen Description A-LINE  Final   Special Requests   Final    BOTTLES DRAWN AEROBIC AND ANAEROBIC Blood Culture adequate volume   Culture   Final    NO GROWTH 5 DAYS Performed at [facility], [address] [place], [place] [address]    Report Status 10/26/2019 FINAL  Final  Culture, blood (Routine X 2) w Reflex to ID Panel     Status: None (Preliminary result)   Collection Time: 10/22/19  4:34 AM   Specimen: BLOOD  Result Value Ref Range Status   Specimen Description BLOOD SITE NOT SPECIFIED  Final   Special Requests   Final    BOTTLES DRAWN AEROBIC AND ANAEROBIC Blood Culture adequate volume   Culture   Final    NO GROWTH 4 DAYS Performed at [facility], [address], [place], [place] [address]    Report Status PENDING  Incomplete    Radiology Reports CT ABDOMEN PELVIS WO CONTRAST  Result Date: 10/17/2019 CLINICAL DATA:  Decubitus ulcers EXAM: CT ABDOMEN AND PELVIS WITHOUT CONTRAST TECHNIQUE: Multidetector CT imaging of the abdomen and pelvis was performed following the standard protocol without IV contrast. COMPARISON:  None. FINDINGS: Lower chest: No acute abnormality. Hepatobiliary: Scattered calcifications are noted within the liver. Gallbladder is well distended with multiple dependent gallstones. Pancreas: Unremarkable. No pancreatic ductal dilatation or surrounding inflammatory changes. Spleen: Normal in size without focal abnormality. Adrenals/Urinary Tract: Adrenal glands are within normal limits. Kidneys are well visualized bilaterally with renal vascular calcifications. No renal stones are seen. No obstructive changes are noted. The bladder is partially distended. Stomach/Bowel: The appendix is not well visualized although no inflammatory changes to suggest appendicitis are noted. Retained fecal material is noted throughout the colon consistent with a mild degree of constipation without obstructive change. No small bowel abnormality is seen. Stomach is  unremarkable. Vascular/Lymphatic: Aortic atherosclerosis. No enlarged abdominal or pelvic lymph nodes. Reproductive: Uterus and bilateral adnexa are unremarkable. Other: No abdominal wall hernia or abnormality. No abdominopelvic ascites. Musculoskeletal: Mild changes of anasarca are noted. Decubitus ulcer is noted laterally near the right hip with inflammatory changes extending from the skin towards the greater trochanter. This area measures approximately 7.6 x 4.3 cm. No definitive bony erosive changes to suggest osteomyelitis are noted at this time. No other definitive decubitus ulcer is seen. Degenerative changes of lumbar spine are seen. IMPRESSION: Right lateral decubitus ulcer with subcutaneous inflammatory changes extending towards the greater trochanter of the right proximal femur. No definitive bony destructive changes are seen. Considerable subcutaneous inflammatory changes noted with air tracking towards the lateral aspect of the femur. Changes of mild constipation. Cholelithiasis without complicating factors. Electronically Signed   By: Inez Catalina M.D.   On: 10/29/2019 16:51   US RENAL  Result Date: 10/18/2019 CLINICAL DATA:  Acute renal insufficiency. EXAM: RENAL / URINARY TRACT ULTRASOUND COMPLETE COMPARISON:  10/25/2019 CT FINDINGS: Right Kidney: Renal measurements: 9.3 x 4.4 x 5.8 cm = volume: 123 mL. Increased renal echogenicity. No hydronephrosis. Left Kidney: Renal measurements: 9.6 x 5.2 x 4.6 cm = volume: 119 mL. Increased renal echogenicity. No hydronephrosis. Bladder: Decompressed. Other: Trace perinephric edema/fluid likely relates to renal insufficiency. Note is made  of gallbladder sludge and stones including at 1.0 cm. IMPRESSION: 1. No hydronephrosis. Increased renal echogenicity, consistent with medical renal disease. 2. Cholelithiasis without acute cholecystitis. Electronically Signed   By: Abigail Miyamoto M.D.   On: 10/18/2019 11:51   DG Chest Port 1 View  Result Date:  10/31/2019 CLINICAL DATA:  Sepsis EXAM: PORTABLE CHEST 1 VIEW COMPARISON:  11/05/2014 FINDINGS: Stable positioning of a left-sided implanted cardiac device. Post CABG changes. Stable cardiomediastinal contours. No focal airspace consolidation, pleural effusion, or pneumothorax. IMPRESSION: No acute cardiopulmonary findings. Electronically Signed   By: Davina Poke D.O.   On: 10/26/2019 14:47   [place] EKG SITE RITE  Result Date: 10/20/2019 If Site Rite image not attached, placement could not be confirmed due to current cardiac rhythm.   CBC Recent Labs  Lab 10/21/19 1633 10/22/19 0949 10/23/19 0855 10/24/19 0842 10/25/19 0518  WBC 19.8* 12.5* 9.2 8.1 9.8  HGB 6.9* 10.3* 10.1* 9.7* 9.9*  HCT 22.6* 32.6* 31.6* 30.9* 32.0*  PLT 188 117* 74* 48* 38*  MCV 97.0 93.4 93.8 93.1 93.3  MCH 29.6 29.5 30.0 29.2 28.9  MCHC 30.5 31.6 32.0 31.4 30.9  RDW 15.6* 16.1* 16.6* 16.8* 17.1*    Chemistries  Recent Labs  Lab 10/21/19 1633 10/21/19 1633 10/22/19 0949 10/23/19 0417 10/23/19 0855 10/24/19 0842 10/25/19 0518  NA 137  --  138  --  139 139 138  K 4.1  --  3.8  --  3.6 3.4* 3.9  CL 112*  --  111  --  113* 113* 113*  CO2 17*  --  15*  --  16* 17* 16*  GLUCOSE 125*  --  141*  --  133* 127* 117*  BUN 57*  --  57*  --  56* 55* 52*  CREATININE 1.72*   < > 1.68* 1.64* 1.64* 1.56* 1.51*  CALCIUM 6.7*  --  6.8*  --  6.8* 7.1* 7.4*  MG 1.9  --   --   --   --   --   --   AST  --   --  19  --   --   --  14*  ALT  --   --  13  --   --   --  12  ALKPHOS  --   --  85  --   --   --  68  BILITOT  --   --  1.0  --   --   --  0.5   < > = values in this interval not displayed.   ------------------------------------------------------------------------------------------------------------------ No results for input(s): CHOL, HDL, LDLCALC, TRIG, CHOLHDL, LDLDIRECT in the last 72 hours.  Lab Results  Component Value Date   HGBA1C 5.5 10/19/2019    ------------------------------------------------------------------------------------------------------------------ No results for input(s): TSH, T4TOTAL, T3FREE, THYROIDAB in the last 72 hours.  Invalid input(s): FREET3 ------------------------------------------------------------------------------------------------------------------ No results for input(s): VITAMINB12, FOLATE, FERRITIN, TIBC, IRON, RETICCTPCT in the last 72 hours.  Coagulation profile No results for input(s): INR, PROTIME in the last 168 hours.  No results for input(s): DDIMER in the last 72 hours.  Cardiac Enzymes No results for input(s): CKMB, TROPONINI, MYOGLOBIN in the last 168 hours.  Invalid input(s): CK     Component Value Date/Time   BNP 583.7 (H) 11/01/2014 9688   Roxan Hockey M.D on 10/26/2019 at 7:13 PM  Go to [email or web address] - for contact info  [facility] Hospitalists - Office  [phone]

## 2019-10-27 LAB — CBC
HCT: 32.4 % — ABNORMAL LOW (ref 36.0–46.0)
Hemoglobin: 10 g/dL — ABNORMAL LOW (ref 12.0–15.0)
MCH: 29.3 pg (ref 26.0–34.0)
MCHC: 30.9 g/dL (ref 30.0–36.0)
MCV: 95 fL (ref 80.0–100.0)
Platelets: 32 10*3/uL — ABNORMAL LOW (ref 150–400)
RBC: 3.41 MIL/uL — ABNORMAL LOW (ref 3.87–5.11)
RDW: 17.6 % — ABNORMAL HIGH (ref 11.5–15.5)
WBC: 12.1 10*3/uL — ABNORMAL HIGH (ref 4.0–10.5)
nRBC: 0 % (ref 0.0–0.2)

## 2019-10-27 LAB — BASIC METABOLIC PANEL
Anion gap: 9 (ref 5–15)
BUN: 47 mg/dL — ABNORMAL HIGH (ref 8–23)
CO2: 15 mmol/L — ABNORMAL LOW (ref 22–32)
Calcium: 7.8 mg/dL — ABNORMAL LOW (ref 8.9–10.3)
Chloride: 114 mmol/L — ABNORMAL HIGH (ref 98–111)
Creatinine, Ser: 1.42 mg/dL — ABNORMAL HIGH (ref 0.44–1.00)
GFR calc Af Amer: 40 mL/min — ABNORMAL LOW (ref >60)
GFR calc non Af Amer: 34 mL/min — ABNORMAL LOW (ref >60)
Glucose, Bld: 115 mg/dL — ABNORMAL HIGH (ref 70–99)
Potassium: 3.5 mmol/L (ref 3.5–5.1)
Sodium: 138 mmol/L (ref 135–145)

## 2019-10-27 LAB — CULTURE, BLOOD (ROUTINE X 2)
Culture: NO GROWTH
Special Requests: ADEQUATE

## 2019-10-27 LAB — GLUCOSE, CAPILLARY
Glucose-Capillary: 113 mg/dL — ABNORMAL HIGH (ref 70–99)
Glucose-Capillary: 119 mg/dL — ABNORMAL HIGH (ref 70–99)
Glucose-Capillary: 119 mg/dL — ABNORMAL HIGH (ref 70–99)
Glucose-Capillary: 94 mg/dL (ref 70–99)
Glucose-Capillary: 97 mg/dL (ref 70–99)
Glucose-Capillary: 98 mg/dL (ref 70–99)

## 2019-10-27 NOTE — Plan of Care (Signed)

## 2019-10-27 NOTE — Progress Notes (Addendum)
Patient Demographics:    [name], is a 85 y.o. female, DOB - 1934-09-12, PRX:[id number]  Admit date - 10/13/2019   Admitting Physician Ejiroghene Arlyce Dice, MD  Outpatient Primary MD for the patient is [name], MD  LOS - 11  Chief Complaint  Patient presents with  . Skin Ulcer        Subjective:   Patient is lying in bed, eyes remain closed.  She does not engage in conversation.  Husband reports that she did open her eyes yesterday, briefly.  She is not had any significant p.o. intake.  Assessment  & Plan :    Principal Problem:   Severe sepsis with septic shock - Klebsiella/Proteus/E. coli/Bacteroides and Staph  Active Problems:   Hypertension   Type II diabetes mellitus (HCC)   Mobitz type 2 second degree atrioventricular block   S/P ICD (internal cardiac defibrillator) procedure, with upgrade to ICD/CRT-D  [employer]   Cardiomyopathy, ischemic EF 20 to 25 %/history of combined systolic and diastolic dysfunction CHF   Polymorphic ventricular tachycardia (HCC)   Sepsis ([name])   Decubitus ulcer of right hip, stage 4 /Infected with E-coli and Proteus   Failure to thrive in adult   Stage I pressure ulcer of sacral region   Decubitus ulcer of left hip, stage 3/Infected with E-coli and Proteus   Bacteremia due to Gram-negative bacteria   Gram negative sepsis (HCC)   AKI (acute kidney injury) ([name])   Goals of care, counseling/discussion   Palliative care by specialist   Encounter for hospice care discussion   Hematochezia   Sepsis secondary to Klebsiella UTI ([name])   Anemia due to acute blood loss/rectal bleeding   Thrombocytopenia Acquired   Brief Summary 85 year old female with a history of advanced Alzheimer's dementia, CAD, H/o combined systolic and diastolic dysfunction CHF/HFrEF/ischemic cardiomyopathy with EF 20 to 25%, h/o torsades VT status post AICD, H/o HTN, DM2, HLD, admitted  on 10/11/2019 from home with metabolic encephalopathy in the setting of presumed infection (leukocytosis and Lactic acid was 3.1) with multiple decubitus ulcers --Able to come off warming blanket on 10/22/19 -Weaned off IV Levophed on 10/22/19 ---Patient still unable to have oral intake due to advanced cognitive deficits--- she will spit food out, clenching her jaw and resist any attempt to feed her  -overall prognosis remains poor due to inability to take oral intake  Assessment/Plan: 1)Severe sepsis with septic shock-- POA -Etiology is multifactorial:-Patient has Klebsiella UTI (urine cx 10/17/19), E. coli and Proteus wound decubitus ulcer/wound infection (wound cx from 10/18/2019 and 10/17/19) and staph epi and Bacteroides bacteremia from blood culture dated 10/24/2019 --Able to come off warming blanket on 10/22/19 -Weaned off IV Levophed on 10/22/19 --Stopped vancomycin after 10/21/2019 -MRSA PCR negative on 10/20/2019 -De-escalate to IV Rocephin and metronidazole on 10/22/2019 Stopped cefepime -Repeat blood culture from 10/21/2019 NGTD -Patient remains encephalopathic, at baseline she does have advanced dementia  with significant cognitive and memory deficits -WBC is down to 9.8  from a peak of 25.6 -PCT was  2.91, ESR 60, CRP 14.1 - overall prognosis remains poor especially given lack of oral intake -Antimicrobials this admission: - Diflucan 3/16 and 3/17  -Ceftriaxone 10/22/2019 through 10/28/2019 inclusive  --metronidazole -10/20/2019 through 10/26/2019 inclusive  --zosyn x  1 on 10/20/2019 -- cefepime 10/20/2019 through 10/22/19 -Vanc --10/08/2019 through 10/22/19 -Eraxis -10/25/2019 through 10/28/2019 inclusive  2)Bacteremia--B.fragilis and staph epi--- ???  Significance of blood culture from 11/07/2019 -??  Contaminant, however patient was septic --Sepsis pathophysiology and Antibiotics as above #1 -Repeat blood cultures on 10/21/2019 NGTD - 3)Infected Bilateral Hip and Sacral decubitus ulcers--please see  photos in epic -10/14/2019 CT abdomen/pelvis--right lateral decubitus ulcer with subcutaneous inflammatory changes extending toward the greater trochanter. There is no bony destructive changes. There is considerable inflammatory change with air tracking toward the lateral aspect of the femur. -General surgery consultappreciated -10/17/19--bedside debridement -Wound cultures from 10/09/2019 and 10/17/2019 with E. coli and Proteus --Wound culture with fungus reluctant to use Diflucan due to prolonged QT concerns---okay to treat with anidulafungin -Sepsis pathophysiology and Antibiotics as above #1  4) Klebsiella UTI--urine culture from 10/17/2019 noted --Sepsis pathophysiology and Antibiotics as above #1  5)Failure to thrive -Serum B12--199-->supplement -TSH--37.364, Free T4--0.28 -Folate--7.2 -PT evaluation-->SNF -continues to have poor po intake -Cognitive status and encephalopathy makes it difficult to feed orally ---Patient still unable to have oral intake due to advanced cognitive deficits--- she will spit food out, clenching her jaw and resist any attempt to feed her -Continue judicious IV dextrose fluids given that patient's EF is down to 20 to 25% -Ensure supplements refused by patient --Remains very challenging to feed patient as she closes her mouth , clenching her jaw and spits out anything -husband has decided against NG tube/Keofeed Tube and against  PEG/G-tube placement  -Today, has been reports that family inquired about NG tube placement.  I have advised against NG tube since it is a temporary measure and would not be appropriate in the patient's current condition.  It would also be significantly uncomfortable for patient to place NG tube.  Her husband agrees.  6)Symptomatic acute on chronic blood loss anemia due to Rectal bleeding-hemoglobin is up  to 9.9 from 6.9 after transfusion of 2 units of PRBC on 10/21/2019 --- Hypothermia has resolved, hypotension resolved -No further  rectal bleeding -Protonix for GI prophylaxis GI consult appreciated suspect rectal injury from fecal impaction--suppositories as ordered -No further GI bleed  7)CKD stage 3b -initially felt to be AKI but now more likely progression of CKD -pt likely has progression of underlying CKD -Creatinine hovering below 2 -No recent renal labs available to compare current values to -renal US-->medical renal disease -Creatinine is down to 1.42 from a peak of 1.83 - renally adjust medications, avoid nephrotoxic agents / dehydration  / hypotension  8)H/o combined systolic and diastolic dysfunction CHF/HFrEF/ischemic cardiomyopathy with EF 20 to 25%- -02/2015 echo EF 20-25%, G2DD -BP improved with iVF   - c/n Judicious IV fluids  9)Hypernatremia/dehydration -Resolved with IV fluids, -due to continued poor po intake and hypoglycemia--will c/n D5 half-normal as ordered  10)Alzheimer's dementia/ambulatory dysfunction -Continue Namenda -spouse states pt screams frequently at home -At baseline patient has very advanced dementia with severe cognitive and memory deficits, she has been bed-bound for the last 3 months at least, requiring total care  11)Coronary artery disease -hx of CABG in 2009 (LIMA-LAD, SVG-ramus, SVG-OM). Hx of RCA stent 04/2012 -no chest pain presently Discontinued Aspirin and Plavix due to rectal bleeding with symptomatic Anemia -Continue statin -Coreg on hold due to hypotension  12)Social/Ethics--- overall prognosis is poor, patient is a DNR/DNI -Plan of care, goals of care discussed with patient's husband at bedside -Official palliative care consult appreciated -Family declined comfort care and hospice at this time --Patient's husband  and patient's kids request that patient not be discharged from the hospital , that the patient be allowed to stay in the hospital as long as possible, patient's husband and patient's daughters state that if patient dies they would prefer that  she dies in the hospital  13)Thrombocytopenia---  Platelets 206 >> 188>>> 117>> 74>> 48>>> 38>>32   14)Ventricular Tachycardia/H/o Torsades -status post AICD placement - she has CRT-D device followed by EP - she underwent ICD upgrade of single chamber ICD to Hillery Aldo CRT-D  Disposition Plan: Patient From: Home D/C Place:Will likely need skilled care upon discharge Barriers: Not Clinically Stable--poor po intake, severe bacterial infection requiring IV antibiotics and IV fluids  -Not medically stable for discharge at this time -Will also need continued conversations with palliative care around prognosis  Code Status : DNR  Procedures-- bedside decubitus ulcer debridement on 10/17/2019 -Right arm PICC line placement on 10/21/2019 -transfusion of 2 units of PRBC on 10/21/2019 -Bedside decubitus debridement by Dr. Constance Haw on 10/22/2019  Family Communication:   Spouse updatedat bedside 3/20  Consults  : Palliative care and general surgery  DVT Prophylaxis  :  Teds and SCDs/rectal bleeding  Lab Results  Component Value Date   PLT 32 (L) 10/27/2019   Inpatient Medications  Scheduled Meds: . Chlorhexidine Gluconate Cloth  6 each Topical Daily  . collagenase   Topical BID  . feeding supplement (ENSURE ENLIVE)  237 mL Oral TID  . hydrocortisone  25 mg Rectal BID  . levothyroxine  37.5 mcg Intravenous Q0600  . mouth rinse  15 mL Mouth Rinse BID  . pantoprazole (PROTONIX) IV  40 mg Intravenous Q24H  . polyethylene glycol  17 g Oral Daily  . sodium chloride flush  10-40 mL Intracatheter Q12H  . sodium chloride flush  3 mL Intravenous Q12H   Continuous Infusions: . sodium chloride 10 mL/hr at 10/21/19 2018  . anidulafungin 100 mg (10/27/19 1550)  . cefTRIAXone (ROCEPHIN)  IV 2 g (10/26/19 2110)  . dextrose 5 % and 0.45% NaCl 50 mL/hr at 10/27/19 0633   PRN Meds:.sodium chloride, acetaminophen **OR** acetaminophen, fentaNYL (SUBLIMAZE) injection, LORazepam, ondansetron  (ZOFRAN) IV, sodium chloride flush, sodium chloride flush   Anti-infectives (From admission, onward)   Start     Dose/Rate Route Frequency Ordered Stop   10/25/19 1500  anidulafungin (ERAXIS) 100 mg in sodium chloride 0.9 % 100 mL IVPB     100 mg 78 mL/hr over 100 Minutes Intravenous Every 24 hours 10/24/19 1728 10/28/19 2359   10/24/19 1400  fluconazole (DIFLUCAN) IVPB 200 mg  Status:  Discontinued     200 mg 100 mL/hr over 60 Minutes Intravenous Every 24 hours 10/23/19 1144 10/24/19 1717   10/23/19 1400  fluconazole (DIFLUCAN) IVPB 400 mg     400 mg 100 mL/hr over 120 Minutes Intravenous  Once 10/23/19 1144 10/23/19 2302   10/22/19 2200  cefTRIAXone (ROCEPHIN) 2 g in sodium chloride 0.9 % 100 mL IVPB     2 g 200 mL/hr over 30 Minutes Intravenous Every 24 hours 10/22/19 1022 10/28/19 2359   10/17/19 2359  vancomycin (VANCOREADY) IVPB 1250 mg/250 mL     1,250 mg 166.7 mL/hr over 90 Minutes Intravenous Every 48 hours 10/15/2019 2022 10/22/19 2222   11/03/2019 2359  metroNIDAZOLE (FLAGYL) IVPB 500 mg  Status:  Discontinued     500 mg 100 mL/hr over 60 Minutes Intravenous Every 8 hours 10/15/2019 2047 10/26/19 1604   10/24/2019 2359  ceFEPIme (MAXIPIME) 2 g in  sodium chloride 0.9 % 100 mL IVPB  Status:  Discontinued     2 g 200 mL/hr over 30 Minutes Intravenous Every 24 hours 10/26/2019 2012 10/22/19 1022   11/06/2019 1430  vancomycin (VANCOCIN) IVPB 1000 mg/200 mL premix     1,000 mg 200 mL/hr over 60 Minutes Intravenous  Once 10/09/2019 1428 10/22/2019 1713   10/19/2019 1430  piperacillin-tazobactam (ZOSYN) IVPB 3.375 g     3.375 g 100 mL/hr over 30 Minutes Intravenous  Once 10/23/2019 1428 11/01/2019 1607       Objective:   Vitals:   10/26/19 2000 10/26/19 2310 10/27/19 0521 10/27/19 0830  BP: 132/62  (!) 115/58 (!) 126/49  Pulse: 72  67 67  Resp: '19  19 18  ' Temp:  97.7 F (36.5 C)    TempSrc:      SpO2: 100% 100% 97% 99%  Weight:      Height:        Wt Readings from Last 3 Encounters:    10/22/19 80.2 kg  06/13/19 78.5 kg  10/19/18 73.6 kg    Intake/Output Summary (Last 24 hours) at 10/27/2019 1748 Last data filed at 10/27/2019 1700 Gross per 24 hour  Intake 3470.6 ml  Output 960 ml  Net 2510.6 ml   Physical Exam  General exam: Somnolent, does not open eyes to voice Respiratory system: Clear to auscultation. Respiratory effort normal. Cardiovascular system:RRR. No murmurs, rubs, gallops. Gastrointestinal system: Abdomen is nondistended, soft and nontender. No organomegaly or masses felt. Normal bowel sounds heard. Central nervous system: Limited exam due to mental status.  No facial asymmetry.  Patient does not participate in exam otherwise Extremities: No C/C/E, +pedal pulses      Data Review:   Micro Results Recent Results (from the past 240 hour(s))  Aerobic/Anaerobic Culture (surgical/deep wound)     Status: None   Collection Time: 10/17/19  6:14 PM   Specimen: Abscess  Result Value Ref Range Status   Specimen Description   Final    ABSCESS Performed at [facility], [address] [place], [place] [address]    Special Requests   Final    RIGHT Performed at [facility], [address], [place], [place] [address]    Gram Stain NO WBC SEEN NO ORGANISMS SEEN   Final   Culture   Final    FEW ESCHERICHIA COLI FEW PROTEUS MIRABILIS MODERATE BACTEROIDES FRAGILIS BETA LACTAMASE POSITIVE Performed at [facility], [place] [address] [place], [place] [address]    Report Status 10/22/2019 FINAL  Final   Organism ID, Bacteria ESCHERICHIA COLI  Final   Organism ID, Bacteria PROTEUS MIRABILIS  Final      Susceptibility   Escherichia coli - MIC*    AMPICILLIN >=32 RESISTANT Resistant     CEFAZOLIN 16 SENSITIVE Sensitive     CEFEPIME <=0.12 SENSITIVE Sensitive     CEFTAZIDIME <=1 SENSITIVE Sensitive     CEFTRIAXONE <=0.25 SENSITIVE Sensitive     CIPROFLOXACIN <=0.25 SENSITIVE Sensitive     GENTAMICIN <=1 SENSITIVE Sensitive     IMIPENEM <=0.25  SENSITIVE Sensitive     TRIMETH/SULFA <=20 SENSITIVE Sensitive     AMPICILLIN/SULBACTAM >=32 RESISTANT Resistant     PIP/TAZO <=4 SENSITIVE Sensitive     * FEW ESCHERICHIA COLI   Proteus mirabilis - MIC*    AMPICILLIN <=2 SENSITIVE Sensitive     CEFAZOLIN <=4 SENSITIVE Sensitive     CEFEPIME <=0.12 SENSITIVE Sensitive     CEFTAZIDIME <=1 SENSITIVE Sensitive  CEFTRIAXONE <=0.25 SENSITIVE Sensitive     CIPROFLOXACIN <=0.25 SENSITIVE Sensitive     GENTAMICIN <=1 SENSITIVE Sensitive     IMIPENEM 4 SENSITIVE Sensitive     TRIMETH/SULFA <=20 SENSITIVE Sensitive     AMPICILLIN/SULBACTAM <=2 SENSITIVE Sensitive     PIP/TAZO <=4 SENSITIVE Sensitive     * FEW PROTEUS MIRABILIS  Aerobic/Anaerobic Culture (surgical/deep wound)     Status: Abnormal   Collection Time: 10/17/19  6:14 PM   Specimen: Abscess  Result Value Ref Range Status   Specimen Description   Final    ABSCESS Performed at [facility], [address] [place], [place] [address]    Special Requests   Final    LEFT Performed at [facility], [address], [place], [place] [address]    Gram Stain   Final    FEW WBC PRESENT,BOTH PMN AND MONONUCLEAR RARE GRAM VARIABLE ROD RARE GRAM POSITIVE COCCI RARE YEAST    Culture (A)  Final    MULTIPLE ORGANISMS PRESENT, NONE PREDOMINANT FEW CANDIDA LUSITANIAE NO STAPHYLOCOCCUS AUREUS ISOLATED NO GROUP A STREP (S.PYOGENES) ISOLATED MODERATE BACTEROIDES FRAGILIS POSITIVE Performed at [facility], [place] [address], [place], [place] [address]    Report Status 10/22/2019 FINAL  Final  MRSA PCR Screening     Status: None   Collection Time: 10/20/19  4:57 PM   Specimen: Nasal Mucosa; Nasopharyngeal  Result Value Ref Range Status   MRSA by PCR NEGATIVE NEGATIVE Final    Comment:        The GeneXpert MRSA Assay (FDA approved for NASAL specimens only), is one component of a comprehensive MRSA colonization surveillance program. It is not intended to diagnose MRSA infection  nor to guide or monitor treatment for MRSA infections. Performed at [facility], [address], [place], [place] [address]   Culture, blood (Routine X 2) w Reflex to ID Panel     Status: None   Collection Time: 10/21/19  6:10 PM   Specimen: A-Line; Blood  Result Value Ref Range Status   Specimen Description A-LINE  Final   Special Requests   Final    BOTTLES DRAWN AEROBIC AND ANAEROBIC Blood Culture adequate volume   Culture   Final    NO GROWTH 5 DAYS Performed at [facility], [address], [place], [place] [address]    Report Status 10/26/2019 FINAL  Final  Culture, blood (Routine X 2) w Reflex to ID Panel     Status: None   Collection Time: 10/22/19  4:34 AM   Specimen: BLOOD  Result Value Ref Range Status   Specimen Description BLOOD SITE NOT SPECIFIED  Final   Special Requests   Final    BOTTLES DRAWN AEROBIC AND ANAEROBIC Blood Culture adequate volume   Culture   Final    NO GROWTH 5 DAYS Performed at [facility], [address], [place], [place] [address]    Report Status 10/27/2019 FINAL  Final    Radiology Reports CT ABDOMEN PELVIS WO CONTRAST  Result Date: 10/21/2019 CLINICAL DATA:  Decubitus ulcers EXAM: CT ABDOMEN AND PELVIS WITHOUT CONTRAST TECHNIQUE: Multidetector CT imaging of the abdomen and pelvis was performed following the standard protocol without IV contrast. COMPARISON:  None. FINDINGS: Lower chest: No acute abnormality. Hepatobiliary: Scattered calcifications are noted within the liver. Gallbladder is well distended with multiple dependent gallstones. Pancreas: Unremarkable. No pancreatic ductal dilatation or surrounding inflammatory changes. Spleen: Normal in size without focal abnormality. Adrenals/Urinary Tract: Adrenal glands are within normal limits.  Kidneys are well visualized bilaterally with renal vascular calcifications. No renal stones are seen. No obstructive changes are noted. The bladder is partially distended. Stomach/Bowel: The appendix is not  well visualized although no inflammatory changes to suggest appendicitis are noted. Retained fecal material is noted throughout the colon consistent with a mild degree of constipation without obstructive change. No small bowel abnormality is seen. Stomach is unremarkable. Vascular/Lymphatic: Aortic atherosclerosis. No enlarged abdominal or pelvic lymph nodes. Reproductive: Uterus and bilateral adnexa are unremarkable. Other: No abdominal wall hernia or abnormality. No abdominopelvic ascites. Musculoskeletal: Mild changes of anasarca are noted. Decubitus ulcer is noted laterally near the right hip with inflammatory changes extending from the skin towards the greater trochanter. This area measures approximately 7.6 x 4.3 cm. No definitive bony erosive changes to suggest osteomyelitis are noted at this time. No other definitive decubitus ulcer is seen. Degenerative changes of lumbar spine are seen. IMPRESSION: Right lateral decubitus ulcer with subcutaneous inflammatory changes extending towards the greater trochanter of the right proximal femur. No definitive bony destructive changes are seen. Considerable subcutaneous inflammatory changes noted with air tracking towards the lateral aspect of the femur. Changes of mild constipation. Cholelithiasis without complicating factors. Electronically Signed   By: Inez Catalina M.D.   On: 10/13/2019 16:51   US RENAL  Result Date: 10/18/2019 CLINICAL DATA:  Acute renal insufficiency. EXAM: RENAL / URINARY TRACT ULTRASOUND COMPLETE COMPARISON:  10/25/2019 CT FINDINGS: Right Kidney: Renal measurements: 9.3 x 4.4 x 5.8 cm = volume: 123 mL. Increased renal echogenicity. No hydronephrosis. Left Kidney: Renal measurements: 9.6 x 5.2 x 4.6 cm = volume: 119 mL. Increased renal echogenicity. No hydronephrosis. Bladder: Decompressed. Other: Trace perinephric edema/fluid likely relates to renal insufficiency. Note is made of gallbladder sludge and stones including at 1.0 cm. IMPRESSION:  1. No hydronephrosis. Increased renal echogenicity, consistent with medical renal disease. 2. Cholelithiasis without acute cholecystitis. Electronically Signed   By: Abigail Miyamoto M.D.   On: 10/18/2019 11:51   DG Chest Port 1 View  Result Date: 10/18/2019 CLINICAL DATA:  Sepsis EXAM: PORTABLE CHEST 1 VIEW COMPARISON:  11/05/2014 FINDINGS: Stable positioning of a left-sided implanted cardiac device. Post CABG changes. Stable cardiomediastinal contours. No focal airspace consolidation, pleural effusion, or pneumothorax. IMPRESSION: No acute cardiopulmonary findings. Electronically Signed   By: Davina Poke D.O.   On: 10/25/2019 14:47   [place] EKG SITE RITE  Result Date: 10/20/2019 If Site Rite image not attached, placement could not be confirmed due to current cardiac rhythm.   CBC Recent Labs  Lab 10/22/19 0949 10/23/19 0855 10/24/19 0842 10/25/19 0518 10/27/19 0358  WBC 12.5* 9.2 8.1 9.8 12.1*  HGB 10.3* 10.1* 9.7* 9.9* 10.0*  HCT 32.6* 31.6* 30.9* 32.0* 32.4*  PLT 117* 74* 48* 38* 32*  MCV 93.4 93.8 93.1 93.3 95.0  MCH 29.5 30.0 29.2 28.9 29.3  MCHC 31.6 32.0 31.4 30.9 30.9  RDW 16.1* 16.6* 16.8* 17.1* 17.6*    Chemistries  Recent Labs  Lab 10/21/19 1633 10/21/19 1633 10/22/19 0949 10/22/19 0949 10/23/19 0417 10/23/19 0855 10/24/19 0842 10/25/19 0518 10/27/19 0358  NA 137   < > 138  --   --  139 139 138 138  K 4.1   < > 3.8  --   --  3.6 3.4* 3.9 3.5  CL 112*   < > 111  --   --  113* 113* 113* 114*  CO2 17*   < > 15*  --   --  16* 17*  16* 15*  GLUCOSE 125*   < > 141*  --   --  133* 127* 117* 115*  BUN 57*   < > 57*  --   --  56* 55* 52* 47*  CREATININE 1.72*   < > 1.68*   < > 1.64* 1.64* 1.56* 1.51* 1.42*  CALCIUM 6.7*   < > 6.8*  --   --  6.8* 7.1* 7.4* 7.8*  MG 1.9  --   --   --   --   --   --   --   --   AST  --   --  19  --   --   --   --  14*  --   ALT  --   --  13  --   --   --   --  12  --   ALKPHOS  --   --  85  --   --   --   --  68  --   BILITOT  --   --   1.0  --   --   --   --  0.5  --    < > = values in this interval not displayed.   ------------------------------------------------------------------------------------------------------------------ No results for input(s): CHOL, HDL, LDLCALC, TRIG, CHOLHDL, LDLDIRECT in the last 72 hours.  Lab Results  Component Value Date   HGBA1C 5.5 10/19/2019   ------------------------------------------------------------------------------------------------------------------ No results for input(s): TSH, T4TOTAL, T3FREE, THYROIDAB in the last 72 hours.  Invalid input(s): FREET3 ------------------------------------------------------------------------------------------------------------------ No results for input(s): VITAMINB12, FOLATE, FERRITIN, TIBC, IRON, RETICCTPCT in the last 72 hours.  Coagulation profile No results for input(s): INR, PROTIME in the last 168 hours.  No results for input(s): DDIMER in the last 72 hours.  Cardiac Enzymes No results for input(s): CKMB, TROPONINI, MYOGLOBIN in the last 168 hours.  Invalid input(s): CK     Component Value Date/Time   BNP 583.7 (H) 11/01/2014 3790   Kathie Dike M.D on 10/27/2019 at 5:48 PM  Go to [email or web address] - for contact info  [facility] - Office  [phone]

## 2019-10-27 NOTE — Progress Notes (Signed)
Unable to get oral ar axillary temperature.  Rectal temp was 94.1 F.  On call MD notified via text page. Awaiting response. Will continue to monitor.

## 2019-10-28 LAB — CBC
HCT: 24.5 % — ABNORMAL LOW (ref 36.0–46.0)
Hemoglobin: 7.4 g/dL — ABNORMAL LOW (ref 12.0–15.0)
MCH: 28.8 pg (ref 26.0–34.0)
MCHC: 30.2 g/dL (ref 30.0–36.0)
MCV: 95.3 fL (ref 80.0–100.0)
Platelets: 27 10*3/uL — CL (ref 150–400)
RBC: 2.57 MIL/uL — ABNORMAL LOW (ref 3.87–5.11)
RDW: 17.4 % — ABNORMAL HIGH (ref 11.5–15.5)
WBC: 9 10*3/uL (ref 4.0–10.5)
nRBC: 0 % (ref 0.0–0.2)

## 2019-10-28 LAB — GLUCOSE, CAPILLARY
Glucose-Capillary: 80 mg/dL (ref 70–99)
Glucose-Capillary: 75 mg/dL (ref 70–99)
Glucose-Capillary: 80 mg/dL (ref 70–99)
Glucose-Capillary: 74 mg/dL (ref 70–99)
Glucose-Capillary: 75 mg/dL (ref 70–99)

## 2019-10-28 LAB — RENAL FUNCTION PANEL
Albumin: 1.7 g/dL — ABNORMAL LOW (ref 3.5–5.0)
Anion gap: 9 (ref 5–15)
BUN: 43 mg/dL — ABNORMAL HIGH (ref 8–23)
CO2: 15 mmol/L — ABNORMAL LOW (ref 22–32)
Calcium: 7.4 mg/dL — ABNORMAL LOW (ref 8.9–10.3)
Chloride: 114 mmol/L — ABNORMAL HIGH (ref 98–111)
Creatinine, Ser: 1.44 mg/dL — ABNORMAL HIGH (ref 0.44–1.00)
GFR calc Af Amer: 39 mL/min — ABNORMAL LOW (ref >60)
GFR calc non Af Amer: 34 mL/min — ABNORMAL LOW (ref >60)
Glucose, Bld: 288 mg/dL — ABNORMAL HIGH (ref 70–99)
Phosphorus: 3 mg/dL (ref 2.5–4.6)
Potassium: 3.3 mmol/L — ABNORMAL LOW (ref 3.5–5.1)
Sodium: 138 mmol/L (ref 135–145)

## 2019-10-28 NOTE — Progress Notes (Signed)
Patient Demographics:    [name], is a 85 y.o. female, DOB - June 18, 1935, JAR:[id number]  Admit date - 10/24/2019   Admitting Physician Ejiroghene Arlyce Dice, MD  Outpatient Primary MD for the patient is [name], MD  LOS - 12  Chief Complaint  Patient presents with  . Skin Ulcer        Subjective:   Patient is laying in bed, occasionally opens her eyes to voice, but does not interact.  Quickly closes her eyes again.  She is grunting.  She does not follow commands or answer questions.  Assessment  & Plan :    Principal Problem:   Severe sepsis with septic shock - Klebsiella/Proteus/E. coli/Bacteroides and Staph  Active Problems:   Hypertension   Type II diabetes mellitus (HCC)   Mobitz type 2 second degree atrioventricular block   S/P ICD (internal cardiac defibrillator) procedure, with upgrade to ICD/CRT-D  [employer]   Cardiomyopathy, ischemic EF 20 to 25 %/history of combined systolic and diastolic dysfunction CHF   Polymorphic ventricular tachycardia (HCC)   Sepsis ([place])   Decubitus ulcer of right hip, stage 4 /Infected with E-coli and Proteus   Failure to thrive in adult   Stage I pressure ulcer of sacral region   Decubitus ulcer of left hip, stage 3/Infected with E-coli and Proteus   Bacteremia due to Gram-negative bacteria   Gram negative sepsis (HCC)   AKI (acute kidney injury) ([place])   Goals of care, counseling/discussion   Palliative care by specialist   Encounter for hospice care discussion   Hematochezia   Sepsis secondary to Klebsiella UTI ([place])   Anemia due to acute blood loss/rectal bleeding   Thrombocytopenia Acquired   Brief Summary 85 year old female with a history of advanced Alzheimer's dementia, CAD, H/o combined systolic and diastolic dysfunction CHF/HFrEF/ischemic cardiomyopathy with EF 20 to 25%, h/o torsades VT status post AICD, H/o HTN, DM2, HLD, admitted on  10/15/2019 from home with metabolic encephalopathy in the setting of presumed infection (leukocytosis and Lactic acid was 3.1) with multiple decubitus ulcers --Able to come off warming blanket on 10/22/19 -Weaned off IV Levophed on 10/22/19 ---Patient still unable to have oral intake due to advanced cognitive deficits--- she will spit food out, clenching her jaw and resist any attempt to feed her  -overall prognosis remains poor due to inability to take oral intake  Assessment/Plan: 1)Severe sepsis with septic shock-- POA -Etiology is multifactorial:-Patient has Klebsiella UTI (urine cx 10/17/19), E. coli and Proteus wound decubitus ulcer/wound infection (wound cx from 10/13/2019 and 10/17/19) and staph epi and Bacteroides bacteremia from blood culture dated 10/17/2019 --Able to come off warming blanket on 10/22/19 -Weaned off IV Levophed on 10/22/19 --Stopped vancomycin after 10/21/2019 -MRSA PCR negative on 10/20/2019 -De-escalate to IV Rocephin and metronidazole on 10/22/2019 Stopped cefepime -Repeat blood culture from 10/21/2019 NGTD -Patient remains encephalopathic, at baseline she does have advanced dementia  with significant cognitive and memory deficits -WBC is down to 9.8  from a peak of 25.6 -PCT was  2.91, ESR 60, CRP 14.1 - overall prognosis remains poor especially given lack of oral intake -Antimicrobials this admission: - Diflucan 3/16 and 3/17  -Ceftriaxone 10/22/2019 through 10/28/2019 inclusive  --metronidazole -11/03/2019 through 10/26/2019 inclusive  --zosyn x 1  on 11/05/2019 -- cefepime 10/19/2019 through 10/22/19 -Vanc --10/22/2019 through 10/22/19 -Eraxis -10/25/2019 through 10/28/2019 inclusive  2)Bacteremia--B.fragilis and staph epi--- ???  Significance of blood culture from 10/11/2019 -??  Contaminant, however patient was septic --Sepsis pathophysiology and Antibiotics as above #1 -Repeat blood cultures on 10/21/2019 NGTD - 3)Infected Bilateral Hip and Sacral decubitus ulcers--please see  photos in epic -10/15/2019 CT abdomen/pelvis--right lateral decubitus ulcer with subcutaneous inflammatory changes extending toward the greater trochanter. There is no bony destructive changes. There is considerable inflammatory change with air tracking toward the lateral aspect of the femur. -Since wounds are bilateral, is difficult to position patient so the pressure is offloaded from wounds -General surgery consultappreciated -10/17/19--bedside debridement -Wound cultures from 10/14/2019 and 10/17/2019 with E. coli and Proteus --Wound culture with fungus reluctant to use Diflucan due to prolonged QT concerns---okay to treat with anidulafungin -Sepsis pathophysiology and Antibiotics as above #1  4) Klebsiella UTI--urine culture from 10/17/2019 noted --Sepsis pathophysiology and Antibiotics as above #1  5)Failure to thrive -Serum B12--199-->supplement -TSH--37.364, Free T4--0.28 -Folate--7.2 -PT evaluation-->SNF -continues to have poor po intake -Cognitive status and encephalopathy makes it difficult to feed orally ---Patient still unable to have oral intake due to advanced cognitive deficits--- she will spit food out, clenching her jaw and resist any attempt to feed her -Continue judicious IV dextrose fluids given that patient's EF is down to 20 to 25% -Ensure supplements refused by patient --Remains very challenging to feed patient as she closes her mouth , clenching her jaw and spits out anything -husband has decided against NG tube/Keofeed Tube and against  PEG/G-tube placement  -On 3/20, husband reports that family inquired again about NG tube placement.  I have advised against NG tube since it is a temporary measure and would not be appropriate in the patient's current condition.  It would also be significantly uncomfortable for patient to place NG tube does not provide any clear benefit in this situation to improve outcomes.  Her husband agrees.  6)Symptomatic acute on chronic blood  loss anemia due to Rectal bleeding-hemoglobin is up  to 9.9 from 6.9 after transfusion of 2 units of PRBC on 10/21/2019 --- Hypothermia has resolved, hypotension resolved -No further rectal bleeding -Protonix for GI prophylaxis GI consult appreciated suspect rectal injury from fecal impaction--suppositories as ordered -Hemoglobin has trended down to 7.4 today.  No evidence of gross bleeding -May be dilutional from IV fluids versus anemia of critical illness. -Recheck in a.m. and transfuse as needed.   7)CKD stage 3b -initially felt to be AKI but now more likely progression of CKD -pt likely has progression of underlying CKD -Creatinine hovering below 2 -No recent renal labs available to compare current values to -renal US-->medical renal disease -Creatinine is down to 1.42 from a peak of 1.83 - renally adjust medications, avoid nephrotoxic agents / dehydration  / hypotension  8)H/o combined systolic and diastolic dysfunction CHF/HFrEF/ischemic cardiomyopathy with EF 20 to 25%- -02/2015 echo EF 20-25%, G2DD -BP improved with iVF   - c/n Judicious IV fluids  9)Hypernatremia/dehydration -Resolved with IV fluids, -due to continued poor po intake and hypoglycemia--will c/n D5 half-normal as ordered  10)Alzheimer's dementia/ambulatory dysfunction -Continue Namenda -spouse states pt screams frequently at home -At baseline patient has very advanced dementia with severe cognitive and memory deficits, she has been bed-bound for the last 3 months at least, requiring total care  11)Coronary artery disease -hx of CABG in 2009 (LIMA-LAD, SVG-ramus, SVG-OM). Hx of RCA stent 04/2012 -no chest pain presently Discontinued  Aspirin and Plavix due to rectal bleeding with symptomatic Anemia -Continue statin -Coreg on hold due to hypotension  12)Social/Ethics--- overall prognosis is poor, patient is a DNR/DNI -Plan of care, goals of care discussed with patient's husband at bedside -Official  palliative care consult appreciated -Family declined comfort care and hospice at this time --Patient's husband and patient's kids request that patient not be discharged from the hospital , that the patient be allowed to stay in the hospital as long as possible, patient's husband and patient's daughters state that if patient dies they would prefer that she dies in the hospital  13)Thrombocytopenia---  Platelets 206 >> 188>>> 117>> 74>> 48>>> 38>>32>>27.  Possibly related to underlying infectious process.  No signs of bleeding at this time.  Continue to follow.  14)Ventricular Tachycardia/H/o Torsades -status post AICD placement - she has CRT-D device followed by EP - she underwent ICD upgrade of single chamber ICD to Hillery Aldo CRT-D  Disposition Plan: Patient From: Home D/C Place:Will likely need skilled care upon discharge Barriers: Not Clinically Stable--poor po intake, severe bacterial infection requiring IV antibiotics and IV fluids  -Not medically stable for discharge at this time -Will also need continued conversations with palliative care around prognosis  Code Status : DNR  Procedures-- bedside decubitus ulcer debridement on 10/17/2019 -Right arm PICC line placement on 10/21/2019 -transfusion of 2 units of PRBC on 10/21/2019 -Bedside decubitus debridement by Dr. Constance Haw on 10/22/2019  Family Communication:   Spouse updatedat bedside 3/21  Consults  : Palliative care and general surgery  DVT Prophylaxis  :  Teds and SCDs/rectal bleeding  Lab Results  Component Value Date   PLT 27 (LL) 10/28/2019   Inpatient Medications  Scheduled Meds: . Chlorhexidine Gluconate Cloth  6 each Topical Daily  . collagenase   Topical BID  . feeding supplement (ENSURE ENLIVE)  237 mL Oral TID  . hydrocortisone  25 mg Rectal BID  . levothyroxine  37.5 mcg Intravenous Q0600  . mouth rinse  15 mL Mouth Rinse BID  . pantoprazole (PROTONIX) IV  40 mg Intravenous Q24H  . polyethylene  glycol  17 g Oral Daily  . sodium chloride flush  10-40 mL Intracatheter Q12H  . sodium chloride flush  3 mL Intravenous Q12H   Continuous Infusions: . sodium chloride 10 mL/hr at 10/21/19 2018  . anidulafungin 100 mg (10/27/19 1550)  . cefTRIAXone (ROCEPHIN)  IV 2 g (10/27/19 2211)  . dextrose 5 % and 0.45% NaCl 50 mL/hr at 10/28/19 1217   PRN Meds:.sodium chloride, acetaminophen **OR** acetaminophen, fentaNYL (SUBLIMAZE) injection, LORazepam, ondansetron (ZOFRAN) IV, sodium chloride flush, sodium chloride flush   Anti-infectives (From admission, onward)   Start     Dose/Rate Route Frequency Ordered Stop   10/25/19 1500  anidulafungin (ERAXIS) 100 mg in sodium chloride 0.9 % 100 mL IVPB     100 mg 78 mL/hr over 100 Minutes Intravenous Every 24 hours 10/24/19 1728 10/28/19 2359   10/24/19 1400  fluconazole (DIFLUCAN) IVPB 200 mg  Status:  Discontinued     200 mg 100 mL/hr over 60 Minutes Intravenous Every 24 hours 10/23/19 1144 10/24/19 1717   10/23/19 1400  fluconazole (DIFLUCAN) IVPB 400 mg     400 mg 100 mL/hr over 120 Minutes Intravenous  Once 10/23/19 1144 10/23/19 2302   10/22/19 2200  cefTRIAXone (ROCEPHIN) 2 g in sodium chloride 0.9 % 100 mL IVPB     2 g 200 mL/hr over 30 Minutes Intravenous Every 24 hours 10/22/19 1022 10/28/19  2359   10/17/19 2359  vancomycin (VANCOREADY) IVPB 1250 mg/250 mL     1,250 mg 166.7 mL/hr over 90 Minutes Intravenous Every 48 hours 10/11/2019 2022 10/22/19 2222   10/22/2019 2359  metroNIDAZOLE (FLAGYL) IVPB 500 mg  Status:  Discontinued     500 mg 100 mL/hr over 60 Minutes Intravenous Every 8 hours 10/28/2019 2047 10/26/19 1604   10/12/2019 2359  ceFEPIme (MAXIPIME) 2 g in sodium chloride 0.9 % 100 mL IVPB  Status:  Discontinued     2 g 200 mL/hr over 30 Minutes Intravenous Every 24 hours 10/08/2019 2012 10/22/19 1022   10/27/2019 1430  vancomycin (VANCOCIN) IVPB 1000 mg/200 mL premix     1,000 mg 200 mL/hr over 60 Minutes Intravenous  Once 10/24/2019 1428  10/24/2019 1713   10/28/2019 1430  piperacillin-tazobactam (ZOSYN) IVPB 3.375 g     3.375 g 100 mL/hr over 30 Minutes Intravenous  Once 10/25/2019 1428 10/12/2019 1607       Objective:   Vitals:   10/27/19 2102 10/27/19 2300 10/28/19 0417 10/28/19 1221  BP:  (!) 105/51 (!) 104/47   Pulse: 81 81 79   Resp: '16 16 20   ' Temp:  99.1 F (37.3 C) 98.5 F (36.9 C) 99.1 F (37.3 C)  TempSrc:  Axillary Oral Axillary  SpO2: 91% 100% 100%   Weight:      Height:        Wt Readings from Last 3 Encounters:  10/22/19 80.2 kg  06/13/19 78.5 kg  10/19/18 73.6 kg    Intake/Output Summary (Last 24 hours) at 10/28/2019 1245 Last data filed at 10/28/2019 0300 Gross per 24 hour  Intake 1194.16 ml  Output 360 ml  Net 834.16 ml   Physical Exam  General exam: Alert, awake, oriented x 3 Respiratory system: Clear to auscultation. Respiratory effort normal. Cardiovascular system:RRR. No murmurs, rubs, gallops. Gastrointestinal system: Abdomen is nondistended, soft and nontender. No organomegaly or masses felt. Normal bowel sounds heard. Central nervous system: Alert and oriented. No focal neurological deficits. Extremities: No C/C/E, +pedal pulses Skin: No rashes, lesions or ulcers Psychiatry: Judgement and insight appear normal. Mood & affect appropriate.        Data Review:   Micro Results Recent Results (from the past 240 hour(s))  MRSA PCR Screening     Status: None   Collection Time: 10/20/19  4:57 PM   Specimen: Nasal Mucosa; Nasopharyngeal  Result Value Ref Range Status   MRSA by PCR NEGATIVE NEGATIVE Final    Comment:        The GeneXpert MRSA Assay (FDA approved for NASAL specimens only), is one component of a comprehensive MRSA colonization surveillance program. It is not intended to diagnose MRSA infection nor to guide or monitor treatment for MRSA infections. Performed at [facility], [address], [place], [place] [address]   Culture, blood (Routine X 2) w Reflex to ID  Panel     Status: None   Collection Time: 10/21/19  6:10 PM   Specimen: A-Line; Blood  Result Value Ref Range Status   Specimen Description A-LINE  Final   Special Requests   Final    BOTTLES DRAWN AEROBIC AND ANAEROBIC Blood Culture adequate volume   Culture   Final    NO GROWTH 5 DAYS Performed at [facility], [address], [place], [place] [address]    Report Status 10/26/2019 FINAL  Final  Culture, blood (Routine X 2) w Reflex to ID Panel     Status:  None   Collection Time: 10/22/19  4:34 AM   Specimen: BLOOD  Result Value Ref Range Status   Specimen Description BLOOD SITE NOT SPECIFIED  Final   Special Requests   Final    BOTTLES DRAWN AEROBIC AND ANAEROBIC Blood Culture adequate volume   Culture   Final    NO GROWTH 5 DAYS Performed at [facility], [address] [place], [place] [address]    Report Status 10/27/2019 FINAL  Final    Radiology Reports CT ABDOMEN PELVIS WO CONTRAST  Result Date: 10/31/2019 CLINICAL DATA:  Decubitus ulcers EXAM: CT ABDOMEN AND PELVIS WITHOUT CONTRAST TECHNIQUE: Multidetector CT imaging of the abdomen and pelvis was performed following the standard protocol without IV contrast. COMPARISON:  None. FINDINGS: Lower chest: No acute abnormality. Hepatobiliary: Scattered calcifications are noted within the liver. Gallbladder is well distended with multiple dependent gallstones. Pancreas: Unremarkable. No pancreatic ductal dilatation or surrounding inflammatory changes. Spleen: Normal in size without focal abnormality. Adrenals/Urinary Tract: Adrenal glands are within normal limits. Kidneys are well visualized bilaterally with renal vascular calcifications. No renal stones are seen. No obstructive changes are noted. The bladder is partially distended. Stomach/Bowel: The appendix is not well visualized although no inflammatory changes to suggest appendicitis are noted. Retained fecal material is noted throughout the colon consistent with a mild degree of  constipation without obstructive change. No small bowel abnormality is seen. Stomach is unremarkable. Vascular/Lymphatic: Aortic atherosclerosis. No enlarged abdominal or pelvic lymph nodes. Reproductive: Uterus and bilateral adnexa are unremarkable. Other: No abdominal wall hernia or abnormality. No abdominopelvic ascites. Musculoskeletal: Mild changes of anasarca are noted. Decubitus ulcer is noted laterally near the right hip with inflammatory changes extending from the skin towards the greater trochanter. This area measures approximately 7.6 x 4.3 cm. No definitive bony erosive changes to suggest osteomyelitis are noted at this time. No other definitive decubitus ulcer is seen. Degenerative changes of lumbar spine are seen. IMPRESSION: Right lateral decubitus ulcer with subcutaneous inflammatory changes extending towards the greater trochanter of the right proximal femur. No definitive bony destructive changes are seen. Considerable subcutaneous inflammatory changes noted with air tracking towards the lateral aspect of the femur. Changes of mild constipation. Cholelithiasis without complicating factors. Electronically Signed   By: Inez Catalina M.D.   On: 10/11/2019 16:51   US RENAL  Result Date: 10/18/2019 CLINICAL DATA:  Acute renal insufficiency. EXAM: RENAL / URINARY TRACT ULTRASOUND COMPLETE COMPARISON:  10/13/2019 CT FINDINGS: Right Kidney: Renal measurements: 9.3 x 4.4 x 5.8 cm = volume: 123 mL. Increased renal echogenicity. No hydronephrosis. Left Kidney: Renal measurements: 9.6 x 5.2 x 4.6 cm = volume: 119 mL. Increased renal echogenicity. No hydronephrosis. Bladder: Decompressed. Other: Trace perinephric edema/fluid likely relates to renal insufficiency. Note is made of gallbladder sludge and stones including at 1.0 cm. IMPRESSION: 1. No hydronephrosis. Increased renal echogenicity, consistent with medical renal disease. 2. Cholelithiasis without acute cholecystitis. Electronically Signed   By: Abigail Miyamoto M.D.   On: 10/18/2019 11:51   DG Chest Port 1 View  Result Date: 10/20/2019 CLINICAL DATA:  Sepsis EXAM: PORTABLE CHEST 1 VIEW COMPARISON:  11/05/2014 FINDINGS: Stable positioning of a left-sided implanted cardiac device. Post CABG changes. Stable cardiomediastinal contours. No focal airspace consolidation, pleural effusion, or pneumothorax. IMPRESSION: No acute cardiopulmonary findings. Electronically Signed   By: Davina Poke D.O.   On: 10/13/2019 14:47   [place] EKG SITE RITE  Result Date: 10/20/2019 If Site Rite image not attached, placement could not be confirmed  due to current cardiac rhythm.   CBC Recent Labs  Lab 10/23/19 0855 10/24/19 0842 10/25/19 0518 10/27/19 0358 10/28/19 0401  WBC 9.2 8.1 9.8 12.1* 9.0  HGB 10.1* 9.7* 9.9* 10.0* 7.4*  HCT 31.6* 30.9* 32.0* 32.4* 24.5*  PLT 74* 48* 38* 32* 27*  MCV 93.8 93.1 93.3 95.0 95.3  MCH 30.0 29.2 28.9 29.3 28.8  MCHC 32.0 31.4 30.9 30.9 30.2  RDW 16.6* 16.8* 17.1* 17.6* 17.4*    Chemistries  Recent Labs  Lab 10/21/19 1633 10/21/19 1633 10/22/19 0949 10/23/19 0417 10/23/19 0855 10/24/19 0842 10/25/19 0518 10/27/19 0358 10/28/19 0401  NA 137   < > 138  --  139 139 138 138 138  K 4.1   < > 3.8  --  3.6 3.4* 3.9 3.5 3.3*  CL 112*   < > 111  --  113* 113* 113* 114* 114*  CO2 17*   < > 15*  --  16* 17* 16* 15* 15*  GLUCOSE 125*   < > 141*  --  133* 127* 117* 115* 288*  BUN 57*   < > 57*  --  56* 55* 52* 47* 43*  CREATININE 1.72*   < > 1.68*   < > 1.64* 1.56* 1.51* 1.42* 1.44*  CALCIUM 6.7*   < > 6.8*  --  6.8* 7.1* 7.4* 7.8* 7.4*  MG 1.9  --   --   --   --   --   --   --   --   AST  --   --  19  --   --   --  14*  --   --   ALT  --   --  13  --   --   --  12  --   --   ALKPHOS  --   --  85  --   --   --  68  --   --   BILITOT  --   --  1.0  --   --   --  0.5  --   --    < > = values in this interval not displayed.    ------------------------------------------------------------------------------------------------------------------ No results for input(s): CHOL, HDL, LDLCALC, TRIG, CHOLHDL, LDLDIRECT in the last 72 hours.  Lab Results  Component Value Date   HGBA1C 5.5 10/19/2019   ------------------------------------------------------------------------------------------------------------------ No results for input(s): TSH, T4TOTAL, T3FREE, THYROIDAB in the last 72 hours.  Invalid input(s): FREET3 ------------------------------------------------------------------------------------------------------------------ No results for input(s): VITAMINB12, FOLATE, FERRITIN, TIBC, IRON, RETICCTPCT in the last 72 hours.  Coagulation profile No results for input(s): INR, PROTIME in the last 168 hours.  No results for input(s): DDIMER in the last 72 hours.  Cardiac Enzymes No results for input(s): CKMB, TROPONINI, MYOGLOBIN in the last 168 hours.  Invalid input(s): CK     Component Value Date/Time   BNP 583.7 (H) 11/01/2014 3888   Kathie Dike M.D on 10/28/2019 at 12:45 PM  Go to [email or web address] - for contact info  [facility] - Office  [phone]

## 2019-10-28 NOTE — Progress Notes (Signed)
Patient had on three rings on the left hand, middle finger. They needed to be removed due to swelling. All three rings were placed in a labeled bag and given to patient's husband around 1:40pm today.

## 2019-10-28 NOTE — Progress Notes (Signed)
Pt's PLT count is 27. [name] notified.

## 2019-10-29 LAB — GLUCOSE, CAPILLARY
Glucose-Capillary: 71 mg/dL (ref 70–99)
Glucose-Capillary: 74 mg/dL (ref 70–99)
Glucose-Capillary: 77 mg/dL (ref 70–99)
Glucose-Capillary: 81 mg/dL (ref 70–99)
Glucose-Capillary: 84 mg/dL (ref 70–99)
Glucose-Capillary: 88 mg/dL (ref 70–99)

## 2019-10-29 LAB — CBC
HCT: 30.1 % — ABNORMAL LOW (ref 36.0–46.0)
Hemoglobin: 9.2 g/dL — ABNORMAL LOW (ref 12.0–15.0)
MCH: 29 pg (ref 26.0–34.0)
MCHC: 30.6 g/dL (ref 30.0–36.0)
MCV: 95 fL (ref 80.0–100.0)
Platelets: 24 10*3/uL — CL (ref 150–400)
RBC: 3.17 MIL/uL — ABNORMAL LOW (ref 3.87–5.11)
RDW: 17.5 % — ABNORMAL HIGH (ref 11.5–15.5)
WBC: 8.7 10*3/uL (ref 4.0–10.5)
nRBC: 0 % (ref 0.0–0.2)

## 2019-10-29 LAB — RENAL FUNCTION PANEL
Albumin: 2 g/dL — ABNORMAL LOW (ref 3.5–5.0)
Anion gap: 8 (ref 5–15)
BUN: 42 mg/dL — ABNORMAL HIGH (ref 8–23)
CO2: 16 mmol/L — ABNORMAL LOW (ref 22–32)
Calcium: 8 mg/dL — ABNORMAL LOW (ref 8.9–10.3)
Chloride: 117 mmol/L — ABNORMAL HIGH (ref 98–111)
Creatinine, Ser: 1.42 mg/dL — ABNORMAL HIGH (ref 0.44–1.00)
GFR calc Af Amer: 40 mL/min — ABNORMAL LOW (ref >60)
GFR calc non Af Amer: 34 mL/min — ABNORMAL LOW (ref >60)
Glucose, Bld: 83 mg/dL (ref 70–99)
Phosphorus: 2.9 mg/dL (ref 2.5–4.6)
Potassium: 3.3 mmol/L — ABNORMAL LOW (ref 3.5–5.1)
Sodium: 141 mmol/L (ref 135–145)

## 2019-10-29 LAB — PROTIME-INR
INR: 1.1 (ref 0.8–1.2)
Prothrombin Time: 14.4 seconds (ref 11.4–15.2)

## 2019-10-29 NOTE — Progress Notes (Addendum)
Patients rectal temp 94.7, Mews Score Yellow. MD notified. Instructed to place patient back on Bare Hugger.

## 2019-10-29 NOTE — TOC Progression Note (Signed)
Transition of Care [facility]) - Progression Note    Patient Details  Name: [name] MRN: [id number] Date of Birth: 02-10-1935  [facility] CM/SW Contact  Leitha Bleak, RN Phone Number: 10/29/2019, 11:14 AM  Clinical Narrative:   Multiple conversation with MD and updated Management about discharge planning. Family continue to want patient to stay in the hospital. Patient is not eating, no medical intervention at this time.  MD discussed with Chaplain in progression the need for Palliative to visit with family again today.  A.Parker is here today they will plan a time at the bedside. TOC to follow.

## 2019-10-29 NOTE — Progress Notes (Signed)
Patient has a red MEWS score.  Temperature is decreased.  ICU notified.  Warm blanket applied, and warming blanket on patient.  Other vitals stable.  Will reassess in 15 minutes.

## 2019-10-29 NOTE — Progress Notes (Signed)
Patient placed on Bare Hugger.

## 2019-10-29 NOTE — Care Management Important Message (Signed)
Important Message  Patient Details  Name: [name] MRN: [id number] Date of Birth: [birth year]   Medicare Important Message Given:  Yes     Corey Harold 10/29/2019, 1:56 PM

## 2019-10-29 NOTE — Progress Notes (Signed)
Palliative:  HPI: 85 yo female with PMH significant for Alzheimer's dementia, bedridden, CAD, systolic CHF (EF 20-25%), torsades s/p Medtronic AICD, hypertension, diabetes admitted 10/10/2019 with decubitus ulcers and poor oral intake with sepsis UTI/wound infection/potential bacteremia, acute kidney injury, anemia and overall failure to thrive with poor intake. Per notes family have decided against feeding tube and do not desire hospice. Now with hypothermia and thrombocytopenia.   I met today at Ms. Steig's bedside and her husband is present at bedside. Ms. Hibner opens her eyes barely and briefly and makes a noise but no words. She seemed like she was attempting to respond but went right back asleep.   I spoke with husband at bedside regarding concern with hypothermia, thrombocytopenia, dwindling urine output. I explained that I am concerned all these are complications and signs that her body is slowly shutting down. He understands that she has had a difficult hospitalization and complicated health concerns. He knows that she has not been eating or drinking and that this will limit her life. We discussed liberalizing visitation for family to be with her as I worry that her time is limited. I believe prognosis is very poor and likely days or less. Discussed with husband consideration of the type of care that we provide to her during this time. Recommend consideration of stopping blood draws and sticks as I worry this is causing more discomfort than benefit to Ms. Satterfield at this time. He plans to speak with his family about the goal of her care at this time. I reassured Mr. Ferrell that we just want to ensure that we are caring for his wife the best we can and Mr. Orona asked appropriate questions.   All questions/concerns addressed. Emotional support provided.   Exam: Minimally responsive and brief. No distress. Breathing regular, unlabored. Abd soft. Extremities warm to touch.   Plan: - Unfortunately  I fear that Ms. Boehne is at the end of her life. I feel prognosis is likely days or less. I encouraged husband to have family visit with her who needs to visit and this will be allowed 3 at a time.  - Discussed with Dr. Empokpae and Chaplain Patricia Wright as well as bedside RN.   55 min   , NP Palliative Medicine Team Pager [phone] (Please see [email or web address] for schedule) Team Phone [phone]    Greater than 50%  of this time was spent counseling and coordinating care related to the above assessment and plan  

## 2019-10-29 NOTE — Progress Notes (Signed)
Patient temperature has been reassessed every 15 minutes.  Patient is alert and responsive.  Patient is resisting temperature being taken, and pulling off warming blanket.  Patient other vitals are stable.  All blankets reapplied, supervisor and ICU RN notified.  Will continue to monitor patient.

## 2019-10-29 NOTE — Progress Notes (Signed)
Patient Demographics:    [name], is a 85 y.o. female, DOB - 06-Nov-1934, YJE:[id number]  Admit date - 10/30/2019   Admitting Physician Ejiroghene Arlyce Dice, MD  Outpatient Primary MD for the patient is [name], MD  LOS - 5  Chief Complaint  Patient presents with  . Skin Ulcer       Subjective:   Patient is laying in bed, ---- eyes closed......   Low temp noted.....  -non-verbal, not really responsive Husband at bedside, questions answered  Assessment  & Plan :    Principal Problem:   Severe sepsis with septic shock - Klebsiella/Proteus/E. coli/Bacteroides and Staph  Active Problems:   S/P ICD (internal cardiac defibrillator) procedure, with upgrade to ICD/CRT-D  Medtronic   Cardiomyopathy, ischemic EF 20 to 25 %/history of combined systolic and diastolic dysfunction CHF   Decubitus ulcer of right hip, stage 4 /Infected with E-coli and Proteus   Failure to thrive in adult   Decubitus ulcer of left hip, stage 3/Infected with E-coli and Proteus   Goals of care, counseling/discussion   Hematochezia   Sepsis secondary to Klebsiella UTI ([place])   Anemia due to acute blood loss/rectal bleeding   Type II diabetes mellitus ([place])   AKI (acute kidney injury) ([place])   Thrombocytopenia Acquired   Hypertension   Mobitz type 2 second degree atrioventricular block   Polymorphic ventricular tachycardia ([facility])   Sepsis ([place])   Stage I pressure ulcer of sacral region   Bacteremia due to Gram-negative bacteria   Gram negative sepsis ([place])   Palliative care by specialist   Encounter for hospice care discussion   Brief Summary 85 year old female with a history of advanced Alzheimer's dementia, CAD, H/o combined systolic and diastolic dysfunction CHF/HFrEF/ischemic cardiomyopathy with EF 20 to 25%, h/o torsades VT status post AICD, H/o HTN, DM2, HLD, admitted on 11/05/2019 from home with metabolic  encephalopathy in the setting of presumed infection (leukocytosis and Lactic acid was 3.1) with multiple decubitus ulcers --Able to come off warming blanket on 10/22/19 -Weaned off IV Levophed on 10/22/19 ---Patient still unable to have oral intake due to advanced cognitive deficits--- she will spit food out, clenching her jaw and resist any attempt to feed her  -overall prognosis remains poor due to inability to take oral intake  Assessment/Plan: 1)Severe sepsis with septic shock-- POA -Etiology is multifactorial:-Patient has Klebsiella UTI (urine cx 10/17/19), E. coli and Proteus wound decubitus ulcer/wound infection (wound cx from 10/30/2019 and 10/17/19) and staph epi and Bacteroides bacteremia from blood culture dated 10/14/2019 --Able to come off warming blanket on 10/22/19 -Weaned off IV Levophed on 10/22/19 --Stopped vancomycin after 10/21/2019 -MRSA PCR negative on 10/20/2019 -Repeat blood culture from 10/21/2019 NGTD -Patient remains encephalopathic, at baseline she does have advanced dementia  with significant cognitive and memory deficits -WBC is down to 8.7  from a peak of 25.6 -PCT was  2.91, ESR 60, CRP 14.1 - overall prognosis remains poor especially given lack of oral intake -Antimicrobials this admission: - Diflucan 3/16 and 3/17  -Ceftriaxone 10/22/2019 through 10/28/2019 inclusive  --metronidazole -10/10/2019 through 10/26/2019 inclusive  --zosyn x 1 on 10/08/2019 -- cefepime 10/17/2019 through 10/22/19 -Vanc --10/20/2019 through 10/22/19 -Eraxis -10/25/2019 through 10/28/2019 inclusive  2)Bacteremia--B.fragilis and staph epi--- ???  Significance of blood culture from 10/19/2019 -??  Contaminant, however patient was septic --Sepsis pathophysiology and Antibiotics as above #1 -Repeat blood cultures on 10/21/2019 NGTD - 3)Infected Bilateral Hip and Sacral decubitus ulcers--please see photos in epic -11/03/2019 CT abdomen/pelvis--right lateral decubitus ulcer with subcutaneous inflammatory changes  extending toward the greater trochanter. There is no bony destructive changes. There is considerable inflammatory change with air tracking toward the lateral aspect of the femur. -Since wounds are bilateral, is difficult to position patient so the pressure is offloaded from wounds -General surgery consultappreciated -10/17/19--bedside debridement -Wound cultures from 11/01/2019 and 10/17/2019 with E. coli and Proteus --Wound culture with fungus reluctant to use Diflucan due to prolonged QT concerns---okay to treat with anidulafungin -Sepsis pathophysiology and Antibiotics as above #1  4) Klebsiella UTI--urine culture from 10/17/2019 noted --Sepsis pathophysiology and Antibiotics as above #1  5)Failure to thrive -Serum B12--199-->supplement -TSH--37.364, Free T4--0.28 -Folate--7.2 -PT evaluation-->SNF -continues to have poor po intake -Cognitive status and encephalopathy makes it difficult to feed orally ---Patient still unable to have oral intake due to advanced cognitive deficits--- she will spit food out, clenching her jaw and resist any attempt to feed her -Continue judicious IV dextrose fluids given that patient's EF is down to 20 to 25% -Ensure supplements refused by patient --Remains very challenging to feed patient as she closes her mouth , clenching her jaw and spits out anything -husband has decided against NG tube/Keofeed Tube and against  PEG/G-tube placement  -On 3/20, husband reports that family inquired again about NG tube placement.  I have advised against NG tube since it is a temporary measure and would not be appropriate in the patient's current condition.  It would also be significantly uncomfortable for patient to place NG tube does not provide any clear benefit in this situation to improve outcomes.  Her husband agrees.  6)Symptomatic acute on chronic blood loss anemia due to Rectal bleeding-hemoglobin is up  to 9.9 from 6.9 after transfusion of 2 units of PRBC on  10/21/2019 --- Hypothermia has resolved, hypotension resolved -No further rectal bleeding -Protonix for GI prophylaxis GI consult appreciated suspect rectal injury from fecal impaction--suppositories as ordered -Hemoglobin is 9.2.  No evidence of gross bleeding -May be dilutional from IV fluids versus anemia of critical illness.  7)CKD stage 3b -initially felt to be AKI but now more likely progression of CKD -pt likely has progression of underlying CKD -Creatinine hovering below 2 -No recent renal labs available to compare current values to -renal US-->medical renal disease -Creatinine is down to 1.42 from a peak of 1.83 - renally adjust medications, avoid nephrotoxic agents / dehydration  / hypotension  8)H/o combined systolic and diastolic dysfunction CHF/HFrEF/ischemic cardiomyopathy with EF 20 to 25%- -02/2015 echo EF 20-25%, G2DD -BP improved with iVF   - c/n Judicious IV fluids  9)Hypernatremia/dehydration -Resolved with IV fluids, -due to continued poor po intake and hypoglycemia--will c/n D5 half-normal as ordered  10)Alzheimer's dementia/ambulatory dysfunction -Continue Namenda -spouse states pt screams frequently at home -At baseline patient has very advanced dementia with severe cognitive and memory deficits, she has been bed-bound for the last 3 months at least, requiring total care  11)Coronary artery disease -hx of CABG in 2009 (LIMA-LAD, SVG-ramus, SVG-OM). Hx of RCA stent 04/2012 -no chest pain presently Discontinued Aspirin and Plavix due to rectal bleeding with symptomatic Anemia -Continue statin -Coreg on hold due to hypotension  12)Social/Ethics--- overall prognosis is poor, patient is a DNR/DNI -Plan of care, goals of care discussed  with patient's husband at bedside -Official palliative care consult appreciated -Family declined comfort care and hospice at this time -Very advanced dementia with significant cognitive and memory deficits--- patient is  total care   13)Thrombocytopenia---  Platelets 206 >> 188>>> 117>> 74>> 48>>> 38>>32>>27>>24.  Possibly related to underlying infectious process.  No signs of bleeding at this time.    14)Ventricular Tachycardia/H/o Torsades -status post AICD placement - she has CRT-D device followed by EP - she underwent ICD upgrade of single chamber ICD to [employer] CRT-D  Disposition Plan: Patient From: Home D/C Place: Hospice at Home Vs [facility]idential [facility] Barriers: Not Clinically Stable--poor po intake, severe bacterial infection requiring IV fluids  --Family declined comfort care and hospice at this time -Will also need continued conversations with palliative care around prognosis  Code Status : DNR  Procedures-- bedside decubitus ulcer debridement on 10/17/2019 -Right arm PICC line placement on 10/21/2019 -transfusion of 2 units of PRBC on 10/21/2019 -Bedside decubitus debridement by Dr. Constance Haw on 10/22/2019  Family Communication:   Spouse updatedat bedside 3/21  Consults  : Palliative care and general surgery  DVT Prophylaxis  :  Teds and SCDs/rectal bleeding  Lab Results  Component Value Date   PLT 24 (LL) 10/29/2019   Inpatient Medications  Scheduled Meds: . Chlorhexidine Gluconate Cloth  6 each Topical Daily  . collagenase   Topical BID  . feeding supplement (ENSURE ENLIVE)  237 mL Oral TID  . hydrocortisone  25 mg Rectal BID  . levothyroxine  37.5 mcg Intravenous Q0600  . mouth rinse  15 mL Mouth Rinse BID  . pantoprazole (PROTONIX) IV  40 mg Intravenous Q24H  . polyethylene glycol  17 g Oral Daily  . sodium chloride flush  10-40 mL Intracatheter Q12H  . sodium chloride flush  3 mL Intravenous Q12H   Continuous Infusions: . sodium chloride 10 mL/hr at 10/21/19 2018  . dextrose 5 % and 0.45% NaCl 50 mL/hr at 10/28/19 1217   PRN Meds:.sodium chloride, acetaminophen **OR** acetaminophen, fentaNYL (SUBLIMAZE) injection, LORazepam, ondansetron (ZOFRAN) IV, sodium  chloride flush, sodium chloride flush   Anti-infectives (From admission, onward)   Start     Dose/Rate Route Frequency Ordered Stop   10/25/19 1500  anidulafungin (ERAXIS) 100 mg in sodium chloride 0.9 % 100 mL IVPB     100 mg 78 mL/hr over 100 Minutes Intravenous Every 24 hours 10/24/19 1728 10/28/19 1803   10/24/19 1400  fluconazole (DIFLUCAN) IVPB 200 mg  Status:  Discontinued     200 mg 100 mL/hr over 60 Minutes Intravenous Every 24 hours 10/23/19 1144 10/24/19 1717   10/23/19 1400  fluconazole (DIFLUCAN) IVPB 400 mg     400 mg 100 mL/hr over 120 Minutes Intravenous  Once 10/23/19 1144 10/23/19 2302   10/22/19 2200  cefTRIAXone (ROCEPHIN) 2 g in sodium chloride 0.9 % 100 mL IVPB     2 g 200 mL/hr over 30 Minutes Intravenous Every 24 hours 10/22/19 1022 10/28/19 2259   10/17/19 2359  vancomycin (VANCOREADY) IVPB 1250 mg/250 mL     1,250 mg 166.7 mL/hr over 90 Minutes Intravenous Every 48 hours 10/19/2019 2022 10/22/19 2222   10/12/2019 2359  metroNIDAZOLE (FLAGYL) IVPB 500 mg  Status:  Discontinued     500 mg 100 mL/hr over 60 Minutes Intravenous Every 8 hours 10/08/2019 2047 10/26/19 1604   10/24/2019 2359  ceFEPIme (MAXIPIME) 2 g in sodium chloride 0.9 % 100 mL IVPB  Status:  Discontinued     2  g 200 mL/hr over 30 Minutes Intravenous Every 24 hours 10/31/2019 2012 10/22/19 1022   10/14/2019 1430  vancomycin (VANCOCIN) IVPB 1000 mg/200 mL premix     1,000 mg 200 mL/hr over 60 Minutes Intravenous  Once 11/04/2019 1428 10/25/2019 1713   10/13/2019 1430  piperacillin-tazobactam (ZOSYN) IVPB 3.375 g     3.375 g 100 mL/hr over 30 Minutes Intravenous  Once 10/28/2019 1428 10/30/2019 1607       Objective:   Vitals:   10/28/19 1546 10/28/19 2105 10/28/19 2106 10/29/19 0534  BP: (!) 131/57 122/67 122/67 132/75  Pulse: 77 80 80 76  Resp: '18  18 18  ' Temp: 98.1 F (36.7 C) (!) 97.5 F (36.4 C) (!) 97.5 F (36.4 C) (!) 94.7 F (34.8 C)  TempSrc: Oral Oral Oral Rectal  SpO2: 100% 100% 100% 100%   Weight:      Height:        Wt Readings from Last 3 Encounters:  10/22/19 80.2 kg  06/13/19 78.5 kg  10/19/18 73.6 kg    Intake/Output Summary (Last 24 hours) at 10/29/2019 1225 Last data filed at 10/29/2019 0768 Gross per 24 hour  Intake 550 ml  Output 300 ml  Net 250 ml   Physical Exam  General exam: Lethargic, nonverbal Respiratory system: Fair air movement, no wheezing cardiovascular system:RRR. No murmurs, rubs, gallops. Gastrointestinal system: Abdomen is nondistended, soft and nontender, Normal bowel sounds heard. Central nervous system:-Bedbound, no purposeful movements  extremities: No C/C/E, +pedal pulses Skin: --Multiple skin decubitus please see photos in epic psychiatry: Very advanced dementia with significant cognitive and memory deficits--- patient is total care     Data Review:   Micro Results Recent Results (from the past 240 hour(s))  MRSA PCR Screening     Status: None   Collection Time: 10/20/19  4:57 PM   Specimen: Nasal Mucosa; Nasopharyngeal  Result Value Ref Range Status   MRSA by PCR NEGATIVE NEGATIVE Final    Comment:        The GeneXpert MRSA Assay (FDA approved for NASAL specimens only), is one component of a comprehensive MRSA colonization surveillance program. It is not intended to diagnose MRSA infection nor to guide or monitor treatment for MRSA infections. Performed at [facility], [address], [place], [place] [address]   Culture, blood (Routine X 2) w Reflex to ID Panel     Status: None   Collection Time: 10/21/19  6:10 PM   Specimen: A-Line; Blood  Result Value Ref Range Status   Specimen Description A-LINE  Final   Special Requests   Final    BOTTLES DRAWN AEROBIC AND ANAEROBIC Blood Culture adequate volume   Culture   Final    NO GROWTH 5 DAYS Performed at [facility], [address] [place], [place] [address]    Report Status 10/26/2019 FINAL  Final  Culture, blood (Routine X 2) w Reflex to ID Panel     Status: None    Collection Time: 10/22/19  4:34 AM   Specimen: BLOOD  Result Value Ref Range Status   Specimen Description BLOOD SITE NOT SPECIFIED  Final   Special Requests   Final    BOTTLES DRAWN AEROBIC AND ANAEROBIC Blood Culture adequate volume   Culture   Final    NO GROWTH 5 DAYS Performed at [facility], [address] [place], [place] [address]    Report Status 10/27/2019 FINAL  Final    Radiology Reports CT ABDOMEN PELVIS WO CONTRAST  Result  Date: 10/28/2019 CLINICAL DATA:  Decubitus ulcers EXAM: CT ABDOMEN AND PELVIS WITHOUT CONTRAST TECHNIQUE: Multidetector CT imaging of the abdomen and pelvis was performed following the standard protocol without IV contrast. COMPARISON:  None. FINDINGS: Lower chest: No acute abnormality. Hepatobiliary: Scattered calcifications are noted within the liver. Gallbladder is well distended with multiple dependent gallstones. Pancreas: Unremarkable. No pancreatic ductal dilatation or surrounding inflammatory changes. Spleen: Normal in size without focal abnormality. Adrenals/Urinary Tract: Adrenal glands are within normal limits. Kidneys are well visualized bilaterally with renal vascular calcifications. No renal stones are seen. No obstructive changes are noted. The bladder is partially distended. Stomach/Bowel: The appendix is not well visualized although no inflammatory changes to suggest appendicitis are noted. Retained fecal material is noted throughout the colon consistent with a mild degree of constipation without obstructive change. No small bowel abnormality is seen. Stomach is unremarkable. Vascular/Lymphatic: Aortic atherosclerosis. No enlarged abdominal or pelvic lymph nodes. Reproductive: Uterus and bilateral adnexa are unremarkable. Other: No abdominal wall hernia or abnormality. No abdominopelvic ascites. Musculoskeletal: Mild changes of anasarca are noted. Decubitus ulcer is noted laterally near the right hip with inflammatory changes extending from the  skin towards the greater trochanter. This area measures approximately 7.6 x 4.3 cm. No definitive bony erosive changes to suggest osteomyelitis are noted at this time. No other definitive decubitus ulcer is seen. Degenerative changes of lumbar spine are seen. IMPRESSION: Right lateral decubitus ulcer with subcutaneous inflammatory changes extending towards the greater trochanter of the right proximal femur. No definitive bony destructive changes are seen. Considerable subcutaneous inflammatory changes noted with air tracking towards the lateral aspect of the femur. Changes of mild constipation. Cholelithiasis without complicating factors. Electronically Signed   By: Inez Catalina M.D.   On: 10/31/2019 16:51   US RENAL  Result Date: 10/18/2019 CLINICAL DATA:  Acute renal insufficiency. EXAM: RENAL / URINARY TRACT ULTRASOUND COMPLETE COMPARISON:  10/15/2019 CT FINDINGS: Right Kidney: Renal measurements: 9.3 x 4.4 x 5.8 cm = volume: 123 mL. Increased renal echogenicity. No hydronephrosis. Left Kidney: Renal measurements: 9.6 x 5.2 x 4.6 cm = volume: 119 mL. Increased renal echogenicity. No hydronephrosis. Bladder: Decompressed. Other: Trace perinephric edema/fluid likely relates to renal insufficiency. Note is made of gallbladder sludge and stones including at 1.0 cm. IMPRESSION: 1. No hydronephrosis. Increased renal echogenicity, consistent with medical renal disease. 2. Cholelithiasis without acute cholecystitis. Electronically Signed   By: Abigail Miyamoto M.D.   On: 10/18/2019 11:51   DG Chest Port 1 View  Result Date: 10/29/2019 CLINICAL DATA:  Sepsis EXAM: PORTABLE CHEST 1 VIEW COMPARISON:  11/05/2014 FINDINGS: Stable positioning of a left-sided implanted cardiac device. Post CABG changes. Stable cardiomediastinal contours. No focal airspace consolidation, pleural effusion, or pneumothorax. IMPRESSION: No acute cardiopulmonary findings. Electronically Signed   By: Davina Poke D.O.   On: 10/13/2019 14:47    [place] EKG SITE RITE  Result Date: 10/20/2019 If Site Rite image not attached, placement could not be confirmed due to current cardiac rhythm.   CBC Recent Labs  Lab 10/24/19 0842 10/25/19 0518 10/27/19 0358 10/28/19 0401 10/29/19 0410  WBC 8.1 9.8 12.1* 9.0 8.7  HGB 9.7* 9.9* 10.0* 7.4* 9.2*  HCT 30.9* 32.0* 32.4* 24.5* 30.1*  PLT 48* 38* 32* 27* 24*  MCV 93.1 93.3 95.0 95.3 95.0  MCH 29.2 28.9 29.3 28.8 29.0  MCHC 31.4 30.9 30.9 30.2 30.6  RDW 16.8* 17.1* 17.6* 17.4* 17.5*    Chemistries  Recent Labs  Lab 10/24/19 0842 10/25/19 0518 10/27/19 0358  10/28/19 0401 10/29/19 0410  NA 139 138 138 138 141  K 3.4* 3.9 3.5 3.3* 3.3*  CL 113* 113* 114* 114* 117*  CO2 17* 16* 15* 15* 16*  GLUCOSE 127* 117* 115* 288* 83  BUN 55* 52* 47* 43* 42*  CREATININE 1.56* 1.51* 1.42* 1.44* 1.42*  CALCIUM 7.1* 7.4* 7.8* 7.4* 8.0*  AST  --  14*  --   --   --   ALT  --  12  --   --   --   ALKPHOS  --  68  --   --   --   BILITOT  --  0.5  --   --   --    ------------------------------------------------------------------------------------------------------------------ No results for input(s): CHOL, HDL, LDLCALC, TRIG, CHOLHDL, LDLDIRECT in the last 72 hours.  Lab Results  Component Value Date   HGBA1C 5.5 10/19/2019   ------------------------------------------------------------------------------------------------------------------ No results for input(s): TSH, T4TOTAL, T3FREE, THYROIDAB in the last 72 hours.  Invalid input(s): FREET3 ------------------------------------------------------------------------------------------------------------------ No results for input(s): VITAMINB12, FOLATE, FERRITIN, TIBC, IRON, RETICCTPCT in the last 72 hours.  Coagulation profile Recent Labs  Lab 10/29/19 0410  INR 1.1    No results for input(s): DDIMER in the last 72 hours.  Cardiac Enzymes No results for input(s): CKMB, TROPONINI, MYOGLOBIN in the last 168 hours.  Invalid input(s): CK      Component Value Date/Time   BNP 583.7 (H) 11/01/2014 4103   Roxan Hockey M.D on 10/29/2019 at 12:25 PM  Go to [email or web address] - for contact info  [facility] - Office  [phone]

## 2019-10-29 NOTE — Progress Notes (Signed)
CRITICAL VALUE ALERT  Critical Value:  Platelets 24  Date & Time Notied: 10/29/19 0621  Provider Notified: Dr.Ortiz  Orders Received/Actions taken: No new orders at this time

## 2019-10-30 LAB — GLUCOSE, CAPILLARY
Glucose-Capillary: 69 mg/dL — ABNORMAL LOW (ref 70–99)
Glucose-Capillary: 71 mg/dL (ref 70–99)
Glucose-Capillary: 72 mg/dL (ref 70–99)
Glucose-Capillary: 76 mg/dL (ref 70–99)
Glucose-Capillary: 76 mg/dL (ref 70–99)
Glucose-Capillary: 77 mg/dL (ref 70–99)
Glucose-Capillary: 81 mg/dL (ref 70–99)

## 2019-10-30 MED ORDER — BISACODYL 10 MG RE SUPP
10.0000 mg | Freq: Once | RECTAL | Status: AC
  Administered 2019-10-30: 10 mg via RECTAL
  Filled 2019-10-30: qty 1

## 2019-10-30 MED ORDER — BISACODYL 10 MG RE SUPP
10.0000 mg | Freq: Every day | RECTAL | Status: DC | PRN
  Filled 2019-10-30: qty 1

## 2019-10-30 NOTE — Progress Notes (Addendum)
Palliative:  HPI: 85 yo female with PMH significant for Alzheimer's dementia, bedridden, CAD, systolic CHF (EF 93-26%), torsades s/p Medtronic AICD, hypertension, diabetes admitted 10/22/2019 with decubitus ulcers and poor oral intake with sepsis UTI/wound infection/potential bacteremia, acute kidney injury, anemia and overall failure to thrive with poor intake. Per notes family have decided against feeding tube and do not desire hospice. Now with hypothermia and thrombocytopenia. Overall adult failure to thrive with underlying illness and poor recovery from acute illness.   I met again today at Ms. Doenges's bedside. Her husband and daughter, Shauna Hugh, are at bedside. Ms. Blunck appears to be resting comfortably. I was unable to get much response other than a slight grimace when completing my assessment. Ms. Danis did not open her eyes or follow any commands - I asked family at bedside if they have been able to get any more response from her and they tell me they have not. [name] is returning home to [place] today.   Mr. Keltz had a few questions and concerns we discussed including constipation and I will order suppository although with limited intake she may not have much and abd is soft. We discussed decreasing blood sugar even with dextrose in IV fluids - discussed the limitations of fluids and dextrose and anticipation that she may not tolerate IV fluids at some point and may have swelling and fluid in lungs. Also concern that the dextrose will have limits to how long this will be able to maintain her blood sugars as well. Foley has been placed due to urinary retention and will prevent bladder distention and discomfort. Also explained to [name] our concern with hyperthermia and thrombocytopenia.   I asked family if they have any questions or concerns for me. Mr. Upperman concerns were addressed and [name] had none she could think of. They both deny any further questions/concerns from rest of family. Mr.  Mcbane reports that Ms. Luz's sister is planning for a visit with her this afternoon.   All questions/concerns addressed. Emotional support provided. Discussed with RN and Dr. Denton Brick.   Exam: Minimally responsive other than grimace but she did slightly turn her body in bed. No distress. Breathing regular, unlabored with no apnea noted. Skin warm to touch - heating blanket in place. No mottling noted.   Plan: - Continue with current interventions.  - Prognosis is poor and likely days.   25 min  Vinie Sill, NP Palliative Medicine Team Pager [phone] (Please see [email or web address] for schedule) Team Phone [phone]    Greater than 50%  of this time was spent counseling and coordinating care related to the above assessment and plan

## 2019-10-30 NOTE — Progress Notes (Signed)
Patient Demographics:    [name], is a 85 y.o. female, DOB - July 29, 1935, IYM:[id number]  Admit date - 10/21/2019   Admitting Physician Ejiroghene Arlyce Dice, MD  Outpatient Primary MD for the patient is [name], MD  LOS - 35  Chief Complaint  Patient presents with  . Skin Ulcer       Subjective:   Patient is laying in bed, ----    -Hypothermia concerns persist -Scant urine output -Borderline blood sugars despite IV dextrose infusion -Attempted to reach husband, his voicemail is full -Palliative care provider spoke with patient's daughter [name] at bedside  Assessment  & Plan :    Principal Problem:   Severe sepsis with septic shock - Klebsiella/Proteus/E. coli/Bacteroides and Staph  Active Problems:   S/P ICD (internal cardiac defibrillator) procedure, with upgrade to ICD/CRT-D  Medtronic   Cardiomyopathy, ischemic EF 20 to 25 %/history of combined systolic and diastolic dysfunction CHF   Decubitus ulcer of right hip, stage 4 /Infected with E-coli and Proteus   Failure to thrive in adult   Decubitus ulcer of left hip, stage 3/Infected with E-coli and Proteus   Goals of care, counseling/discussion   Hematochezia   Sepsis secondary to Klebsiella UTI ([place])   Anemia due to acute blood loss/rectal bleeding   Type II diabetes mellitus ([place])   AKI (acute kidney injury) ([place])   Thrombocytopenia Acquired   Hypertension   Mobitz type 2 second degree atrioventricular block   Polymorphic ventricular tachycardia (HCC)   Sepsis (HCC)   Stage I pressure ulcer of sacral region   Bacteremia due to Gram-negative bacteria   Gram negative sepsis (HCC)   Palliative care by specialist   Encounter for hospice care discussion   Brief Summary 85 year old female with a history of advanced Alzheimer's dementia, CAD, H/o combined systolic and diastolic dysfunction CHF/HFrEF/ischemic cardiomyopathy with EF  20 to 25%, h/o torsades VT status post AICD, H/o HTN, DM2, HLD, admitted on 10/11/2019 from home with metabolic encephalopathy in the setting of presumed infection (leukocytosis and Lactic acid was 3.1) with multiple decubitus ulcers --Able to come off warming blanket on 10/22/19 -Weaned off IV Levophed on 10/22/19 ---Patient still unable to have oral intake due to advanced cognitive deficits--- she will spit food out, clenching her jaw and resist any attempt to feed her  -overall prognosis remains poor due to inability to take oral intake  Assessment/Plan: 1)Severe sepsis with septic shock-- POA -Etiology is multifactorial:-Patient has Klebsiella UTI (urine cx 10/17/19), E. coli and Proteus wound decubitus ulcer/wound infection (wound cx from 10/23/2019 and 10/17/19) and staph epi and Bacteroides bacteremia from blood culture dated 11/01/2019 -Weaned off IV Levophed on 10/22/19 --Stopped vancomycin after 10/21/2019 -MRSA PCR negative on 10/20/2019 -Repeat blood culture from 10/21/2019 NGTD -Patient remains encephalopathic, at baseline she does have advanced dementia  with significant cognitive and memory deficits -WBC is down to 8.7  from a peak of 25.6 -PCT was  2.91, ESR 60, CRP 14.1 - overall prognosis remains poor especially given lack of oral intake -Antimicrobials this admission: - Diflucan 3/16 and 3/17  -Ceftriaxone 10/22/2019 through 10/28/2019 inclusive  --metronidazole -10/14/2019 through 10/26/2019 inclusive  --zosyn x 1 on 10/13/2019 -- cefepime 10/21/2019 through 10/22/19 -Vanc --10/26/2019 through 10/22/19 -Eraxis -10/25/2019  through 10/28/2019 inclusive --Hypothermia persist, continue warming blanket  2)Bacteremia--B.fragilis and staph epi--- ???  Significance of blood culture from 10/26/2019 -??  Contaminant, however patient was septic --Sepsis pathophysiology and Antibiotics as above #1 -Repeat blood cultures on 10/21/2019 NGTD - 3)Infected Bilateral Hip and Sacral decubitus ulcers--please see  photos in epic -11/05/2019 CT abdomen/pelvis--right lateral decubitus ulcer with subcutaneous inflammatory changes extending toward the greater trochanter. There is no bony destructive changes. There is considerable inflammatory change with air tracking toward the lateral aspect of the femur. -Since wounds are bilateral, is difficult to position patient so the pressure is offloaded from wounds -General surgery consultappreciated -10/17/19--bedside debridement -Wound cultures from 10/19/2019 and 10/17/2019 with E. coli and Proteus --Wound culture with fungus reluctant to use Diflucan due to prolonged QT concerns---okay to treat with anidulafungin -Sepsis pathophysiology and Antibiotics as above #1  4) Klebsiella UTI--urine culture from 10/17/2019 noted --Sepsis pathophysiology and Antibiotics as above #1  5)Failure to thrive -Serum B12--199-->supplement -TSH--37.364, Free T4--0.28 -Folate--7.2 -PT evaluation-->SNF -continues to have poor po intake -Cognitive status and encephalopathy makes it difficult to feed orally ---Patient still unable to have oral intake due to advanced cognitive deficits--- she will spit food out, clenching her jaw and resist any attempt to feed her -Continue judicious IV dextrose fluids given that patient's EF is down to 20 to 25% -Borderline blood glucose despite IV dextrose infusion -Ensure supplements refused by patient --Remains very challenging to feed patient as she closes her mouth , clenching her jaw and spits out anything -husband has decided against NG tube/Keofeed Tube and against  PEG/G-tube placement  -On 3/20, husband reports that family inquired again about NG tube placement.  I have advised against NG tube since it is a temporary measure and would not be appropriate in the patient's current condition.  It would also be significantly uncomfortable for patient to place NG tube does not provide any clear benefit in this situation to improve outcomes.  Her  husband agrees.  6)Symptomatic acute on chronic blood loss anemia due to Rectal bleeding-hemoglobin is up  to 9.2 from 6.9 after transfusion of 2 units of PRBC on 10/21/2019 --- Hypothermia has resolved, hypotension resolved -No further rectal bleeding -Protonix for GI prophylaxis GI consult appreciated suspect rectal injury from fecal impaction--suppositories as ordered -  No evidence of gross bleeding -May be dilutional from IV fluids versus anemia of critical illness.  7)CKD stage 3b -initially felt to be AKI but now more likely progression of CKD -pt likely has progression of underlying CKD -Creatinine hovering below 2 -No recent renal labs available to compare current values to -renal US-->medical renal disease -Creatinine is down to 1.42 from a peak of 1.83 - renally adjust medications, avoid nephrotoxic agents / dehydration  / hypotension -Very scant urine output in Foley bag -Foley was placed due to urinary retention  8)H/o combined systolic and diastolic dysfunction CHF/HFrEF/ischemic cardiomyopathy with EF 20 to 25%- -02/2015 echo EF 20-25%, G2DD -BP improved with iVF   - c/n Judicious IV fluids  9)Hypernatremia/dehydration -Resolved with IV fluids, -due to continued poor po intake and hypoglycemia--will c/n D5 half-normal as ordered  10)Alzheimer's dementia/ambulatory dysfunction -Continue Namenda -spouse states pt screams frequently at home -At baseline patient has very advanced dementia with severe cognitive and memory deficits, she has been bed-bound for the last 3 months at least, requiring total care  11)Coronary artery disease -hx of CABG in 2009 (LIMA-LAD, SVG-ramus, SVG-OM). Hx of RCA stent 04/2012 -no chest pain presently Discontinued Aspirin  and Plavix due to rectal bleeding with symptomatic Anemia -Continue statin -Coreg on hold due to hypotension  12)Social/Ethics--- overall prognosis is poor, patient is a DNR/DNI -Plan of care, goals of care  discussed with patient's husband at bedside -Official palliative care consult appreciated -Family declined comfort care and hospice at this time -Very advanced dementia with significant cognitive and memory deficits--- patient is total care   13)Thrombocytopenia---  Platelets 206 >> 188>>> 117>> 74>> 48>>> 38>>32>>27>>24.  Possibly related to underlying infectious process.  No signs of bleeding at this time.    14)Ventricular Tachycardia/H/o Torsades -status post AICD placement - she has CRT-D device followed by EP - she underwent ICD upgrade of single chamber ICD to [employer] CRT-D  Disposition Plan: Patient From: Home D/C Place: Hospice at Home Vs Residential Hospice Barriers: Not Clinically Stable--poor po intake, severe bacterial infection requiring IV fluids  --Family declined comfort care and hospice at this time -Will also need continued conversations with palliative care around prognosis  Code Status : DNR  Procedures-- bedside decubitus ulcer debridement on 10/17/2019 -Right arm PICC line placement on 10/21/2019 -transfusion of 2 units of PRBC on 10/21/2019 -Bedside decubitus debridement by Dr. Constance Haw on 10/22/2019  Family Communication:   Spouse updatedat bedside 3/21  Consults  : Palliative care and general surgery  DVT Prophylaxis  :  Teds and SCDs/rectal bleeding  Lab Results  Component Value Date   PLT 24 (LL) 10/29/2019   Inpatient Medications  Scheduled Meds: . bisacodyl  10 mg Rectal Once  . Chlorhexidine Gluconate Cloth  6 each Topical Daily  . collagenase   Topical BID  . feeding supplement (ENSURE ENLIVE)  237 mL Oral TID  . hydrocortisone  25 mg Rectal BID  . levothyroxine  37.5 mcg Intravenous Q0600  . mouth rinse  15 mL Mouth Rinse BID  . pantoprazole (PROTONIX) IV  40 mg Intravenous Q24H  . polyethylene glycol  17 g Oral Daily  . sodium chloride flush  10-40 mL Intracatheter Q12H   Continuous Infusions: . sodium chloride 10 mL/hr at  10/21/19 2018  . dextrose 5 % and 0.45% NaCl 50 mL/hr at 10/30/19 1146   PRN Meds:.sodium chloride, acetaminophen **OR** acetaminophen, [START ON 10/31/2019] bisacodyl, fentaNYL (SUBLIMAZE) injection, LORazepam, ondansetron (ZOFRAN) IV, sodium chloride flush   Anti-infectives (From admission, onward)   Start     Dose/Rate Route Frequency Ordered Stop   10/25/19 1500  anidulafungin (ERAXIS) 100 mg in sodium chloride 0.9 % 100 mL IVPB     100 mg 78 mL/hr over 100 Minutes Intravenous Every 24 hours 10/24/19 1728 10/28/19 1803   10/24/19 1400  fluconazole (DIFLUCAN) IVPB 200 mg  Status:  Discontinued     200 mg 100 mL/hr over 60 Minutes Intravenous Every 24 hours 10/23/19 1144 10/24/19 1717   10/23/19 1400  fluconazole (DIFLUCAN) IVPB 400 mg     400 mg 100 mL/hr over 120 Minutes Intravenous  Once 10/23/19 1144 10/23/19 2302   10/22/19 2200  cefTRIAXone (ROCEPHIN) 2 g in sodium chloride 0.9 % 100 mL IVPB     2 g 200 mL/hr over 30 Minutes Intravenous Every 24 hours 10/22/19 1022 10/28/19 2259   10/17/19 2359  vancomycin (VANCOREADY) IVPB 1250 mg/250 mL     1,250 mg 166.7 mL/hr over 90 Minutes Intravenous Every 48 hours 10/14/2019 2022 10/22/19 2222   11/01/2019 2359  metroNIDAZOLE (FLAGYL) IVPB 500 mg  Status:  Discontinued     500 mg 100 mL/hr over 60 Minutes Intravenous  Every 8 hours 11/06/2019 2047 10/26/19 1604   11/03/2019 2359  ceFEPIme (MAXIPIME) 2 g in sodium chloride 0.9 % 100 mL IVPB  Status:  Discontinued     2 g 200 mL/hr over 30 Minutes Intravenous Every 24 hours 10/10/2019 2012 10/22/19 1022   10/25/2019 1430  vancomycin (VANCOCIN) IVPB 1000 mg/200 mL premix     1,000 mg 200 mL/hr over 60 Minutes Intravenous  Once 10/12/2019 1428 10/24/2019 1713   10/11/2019 1430  piperacillin-tazobactam (ZOSYN) IVPB 3.375 g     3.375 g 100 mL/hr over 30 Minutes Intravenous  Once 10/15/2019 1428 10/15/2019 1607       Objective:   Vitals:   10/29/19 2200 10/30/19 0100 10/30/19 0447 10/30/19 0859  BP:   (!)  126/47   Pulse:   69   Resp:   14   Temp: (!) 95 F (35 C) (!) 96.9 F (36.1 C) (!) 97.1 F (36.2 C)   TempSrc: Oral Axillary Axillary   SpO2:   100% 98%  Weight:      Height:        Wt Readings from Last 3 Encounters:  10/22/19 80.2 kg  06/13/19 78.5 kg  10/19/18 73.6 kg    Intake/Output Summary (Last 24 hours) at 10/30/2019 1254 Last data filed at 10/30/2019 1146 Gross per 24 hour  Intake 1104.02 ml  Output 700 ml  Net 404.02 ml   Physical Exam  General exam: Lethargic, nonverbal Respiratory system: Fair air movement, no wheezing cardiovascular system:RRR.  Prior sternotomy scar Gastrointestinal system: Abdomen is nondistended, soft and nontender, Normal bowel sounds heard. Central nervous system:-Bedbound, no purposeful movements  extremities: No C/C/E, +pedal pulses Skin: --Multiple skin decubitus please see photos in epic psychiatry: Very advanced dementia with significant cognitive and memory deficits--- patient is total care GU-Foley with scant urine MSK-right arm PICC line     Data Review:   Micro Results Recent Results (from the past 240 hour(s))  MRSA PCR Screening     Status: None   Collection Time: 10/20/19  4:57 PM   Specimen: Nasal Mucosa; Nasopharyngeal  Result Value Ref Range Status   MRSA by PCR NEGATIVE NEGATIVE Final    Comment:        The GeneXpert MRSA Assay (FDA approved for NASAL specimens only), is one component of a comprehensive MRSA colonization surveillance program. It is not intended to diagnose MRSA infection nor to guide or monitor treatment for MRSA infections. Performed at [facility], [address] [place], [place] [address]   Culture, blood (Routine X 2) w Reflex to ID Panel     Status: None   Collection Time: 10/21/19  6:10 PM   Specimen: A-Line; Blood  Result Value Ref Range Status   Specimen Description A-LINE  Final   Special Requests   Final    BOTTLES DRAWN AEROBIC AND ANAEROBIC Blood Culture adequate volume    Culture   Final    NO GROWTH 5 DAYS Performed at [facility], [address], [place], [place] [address]    Report Status 10/26/2019 FINAL  Final  Culture, blood (Routine X 2) w Reflex to ID Panel     Status: None   Collection Time: 10/22/19  4:34 AM   Specimen: BLOOD  Result Value Ref Range Status   Specimen Description BLOOD SITE NOT SPECIFIED  Final   Special Requests   Final    BOTTLES DRAWN AEROBIC AND ANAEROBIC Blood Culture adequate volume   Culture   Final  NO GROWTH 5 DAYS Performed at [facility], [address], [place], [place] [address]    Report Status 10/27/2019 FINAL  Final    Radiology Reports CT ABDOMEN PELVIS WO CONTRAST  Result Date: 10/23/2019 CLINICAL DATA:  Decubitus ulcers EXAM: CT ABDOMEN AND PELVIS WITHOUT CONTRAST TECHNIQUE: Multidetector CT imaging of the abdomen and pelvis was performed following the standard protocol without IV contrast. COMPARISON:  None. FINDINGS: Lower chest: No acute abnormality. Hepatobiliary: Scattered calcifications are noted within the liver. Gallbladder is well distended with multiple dependent gallstones. Pancreas: Unremarkable. No pancreatic ductal dilatation or surrounding inflammatory changes. Spleen: Normal in size without focal abnormality. Adrenals/Urinary Tract: Adrenal glands are within normal limits. Kidneys are well visualized bilaterally with renal vascular calcifications. No renal stones are seen. No obstructive changes are noted. The bladder is partially distended. Stomach/Bowel: The appendix is not well visualized although no inflammatory changes to suggest appendicitis are noted. Retained fecal material is noted throughout the colon consistent with a mild degree of constipation without obstructive change. No small bowel abnormality is seen. Stomach is unremarkable. Vascular/Lymphatic: Aortic atherosclerosis. No enlarged abdominal or pelvic lymph nodes. Reproductive: Uterus and bilateral adnexa are unremarkable. Other: No  abdominal wall hernia or abnormality. No abdominopelvic ascites. Musculoskeletal: Mild changes of anasarca are noted. Decubitus ulcer is noted laterally near the right hip with inflammatory changes extending from the skin towards the greater trochanter. This area measures approximately 7.6 x 4.3 cm. No definitive bony erosive changes to suggest osteomyelitis are noted at this time. No other definitive decubitus ulcer is seen. Degenerative changes of lumbar spine are seen. IMPRESSION: Right lateral decubitus ulcer with subcutaneous inflammatory changes extending towards the greater trochanter of the right proximal femur. No definitive bony destructive changes are seen. Considerable subcutaneous inflammatory changes noted with air tracking towards the lateral aspect of the femur. Changes of mild constipation. Cholelithiasis without complicating factors. Electronically Signed   By: Inez Catalina M.D.   On: 11/01/2019 16:51   US RENAL  Result Date: 10/18/2019 CLINICAL DATA:  Acute renal insufficiency. EXAM: RENAL / URINARY TRACT ULTRASOUND COMPLETE COMPARISON:  11/07/2019 CT FINDINGS: Right Kidney: Renal measurements: 9.3 x 4.4 x 5.8 cm = volume: 123 mL. Increased renal echogenicity. No hydronephrosis. Left Kidney: Renal measurements: 9.6 x 5.2 x 4.6 cm = volume: 119 mL. Increased renal echogenicity. No hydronephrosis. Bladder: Decompressed. Other: Trace perinephric edema/fluid likely relates to renal insufficiency. Note is made of gallbladder sludge and stones including at 1.0 cm. IMPRESSION: 1. No hydronephrosis. Increased renal echogenicity, consistent with medical renal disease. 2. Cholelithiasis without acute cholecystitis. Electronically Signed   By: Abigail Miyamoto M.D.   On: 10/18/2019 11:51   DG Chest Port 1 View  Result Date: 11/03/2019 CLINICAL DATA:  Sepsis EXAM: PORTABLE CHEST 1 VIEW COMPARISON:  11/05/2014 FINDINGS: Stable positioning of a left-sided implanted cardiac device. Post CABG changes. Stable  cardiomediastinal contours. No focal airspace consolidation, pleural effusion, or pneumothorax. IMPRESSION: No acute cardiopulmonary findings. Electronically Signed   By: Davina Poke D.O.   On: 10/29/2019 14:47   [place] EKG SITE RITE  Result Date: 10/20/2019 If Site Rite image not attached, placement could not be confirmed due to current cardiac rhythm.   CBC Recent Labs  Lab 10/24/19 0842 10/25/19 0518 10/27/19 0358 10/28/19 0401 10/29/19 0410  WBC 8.1 9.8 12.1* 9.0 8.7  HGB 9.7* 9.9* 10.0* 7.4* 9.2*  HCT 30.9* 32.0* 32.4* 24.5* 30.1*  PLT 48* 38* 32* 27* 24*  MCV 93.1 93.3 95.0 95.3 95.0  MCH 29.2 28.9 29.3 28.8 29.0  MCHC 31.4 30.9 30.9 30.2 30.6  RDW 16.8* 17.1* 17.6* 17.4* 17.5*    Chemistries  Recent Labs  Lab 10/24/19 0842 10/25/19 0518 10/27/19 0358 10/28/19 0401 10/29/19 0410  NA 139 138 138 138 141  K 3.4* 3.9 3.5 3.3* 3.3*  CL 113* 113* 114* 114* 117*  CO2 17* 16* 15* 15* 16*  GLUCOSE 127* 117* 115* 288* 83  BUN 55* 52* 47* 43* 42*  CREATININE 1.56* 1.51* 1.42* 1.44* 1.42*  CALCIUM 7.1* 7.4* 7.8* 7.4* 8.0*  AST  --  14*  --   --   --   ALT  --  12  --   --   --   ALKPHOS  --  68  --   --   --   BILITOT  --  0.5  --   --   --    ------------------------------------------------------------------------------------------------------------------ No results for input(s): CHOL, HDL, LDLCALC, TRIG, CHOLHDL, LDLDIRECT in the last 72 hours.  Lab Results  Component Value Date   HGBA1C 5.5 10/19/2019   ------------------------------------------------------------------------------------------------------------------ No results for input(s): TSH, T4TOTAL, T3FREE, THYROIDAB in the last 72 hours.  Invalid input(s): FREET3 ------------------------------------------------------------------------------------------------------------------ No results for input(s): VITAMINB12, FOLATE, FERRITIN, TIBC, IRON, RETICCTPCT in the last 72 hours.  Coagulation profile Recent  Labs  Lab 10/29/19 0410  INR 1.1    No results for input(s): DDIMER in the last 72 hours.  Cardiac Enzymes No results for input(s): CKMB, TROPONINI, MYOGLOBIN in the last 168 hours.  Invalid input(s): CK     Component Value Date/Time   BNP 583.7 (H) 11/01/2014 7564   Roxan Hockey M.D on 10/30/2019 at 12:54 PM  Go to [email or web address] - for contact info  [facility] - Office  [phone]

## 2019-10-31 LAB — CBC
HCT: 25.2 % — ABNORMAL LOW (ref 36.0–46.0)
Hemoglobin: 7.7 g/dL — ABNORMAL LOW (ref 12.0–15.0)
MCH: 28.7 pg (ref 26.0–34.0)
MCHC: 30.6 g/dL (ref 30.0–36.0)
MCV: 94 fL (ref 80.0–100.0)
Platelets: 18 10*3/uL — CL (ref 150–400)
RBC: 2.68 MIL/uL — ABNORMAL LOW (ref 3.87–5.11)
RDW: 17.6 % — ABNORMAL HIGH (ref 11.5–15.5)
WBC: 5.6 10*3/uL (ref 4.0–10.5)
nRBC: 0 % (ref 0.0–0.2)

## 2019-10-31 LAB — GLUCOSE, CAPILLARY
Glucose-Capillary: 74 mg/dL (ref 70–99)
Glucose-Capillary: 76 mg/dL (ref 70–99)
Glucose-Capillary: 76 mg/dL (ref 70–99)
Glucose-Capillary: 78 mg/dL (ref 70–99)
Glucose-Capillary: 79 mg/dL (ref 70–99)
Glucose-Capillary: 84 mg/dL (ref 70–99)

## 2019-10-31 LAB — BASIC METABOLIC PANEL
Anion gap: 7 (ref 5–15)
BUN: 36 mg/dL — ABNORMAL HIGH (ref 8–23)
CO2: 17 mmol/L — ABNORMAL LOW (ref 22–32)
Calcium: 7.9 mg/dL — ABNORMAL LOW (ref 8.9–10.3)
Chloride: 118 mmol/L — ABNORMAL HIGH (ref 98–111)
Creatinine, Ser: 1.38 mg/dL — ABNORMAL HIGH (ref 0.44–1.00)
GFR calc Af Amer: 41 mL/min — ABNORMAL LOW (ref >60)
GFR calc non Af Amer: 36 mL/min — ABNORMAL LOW (ref >60)
Glucose, Bld: 81 mg/dL (ref 70–99)
Potassium: 3 mmol/L — ABNORMAL LOW (ref 3.5–5.1)
Sodium: 142 mmol/L (ref 135–145)

## 2019-10-31 MED ORDER — POTASSIUM CHLORIDE 10 MEQ/100ML IV SOLN
10.0000 meq | INTRAVENOUS | Status: AC
  Administered 2019-10-31 (×4): 10 meq via INTRAVENOUS
  Filled 2019-10-31 (×4): qty 100

## 2019-10-31 NOTE — Progress Notes (Signed)
Patient Demographics:    [name], is a 85 y.o. female, DOB - June 20, 1935, KWI:[id number]  Admit date - 10/19/2019   Admitting Physician Ejiroghene Arlyce Dice, MD  Outpatient Primary MD for the patient is [name], MD  LOS - 15  Chief Complaint  Patient presents with  . Skin Ulcer       Subjective:   Patient is laying in bed, ----    -Husband at bedside, patient is opening her eyes so husband would like to feed her--- I kept the head of the bed above 40%, gave the husband Jell-O, ensure and  ice cream to try -No significant urine output -Low platelets noted (18K) ,  no bleeding noted   Assessment  & Plan :    Principal Problem:   Severe sepsis with septic shock - Klebsiella/Proteus/E. coli/Bacteroides and Staph  Active Problems:   S/P ICD (internal cardiac defibrillator) procedure, with upgrade to ICD/CRT-D  [employer]   Cardiomyopathy, ischemic EF 20 to 25 %/history of combined systolic and diastolic dysfunction CHF   Decubitus ulcer of right hip, stage 4 /Infected with E-coli and Proteus   Failure to thrive in adult   Decubitus ulcer of left hip, stage 3/Infected with E-coli and Proteus   Goals of care, counseling/discussion   Hematochezia   Sepsis secondary to Klebsiella UTI ([place])   Anemia due to acute blood loss/rectal bleeding   Type II diabetes mellitus (HCC)   AKI (acute kidney injury) ([place])   Thrombocytopenia Acquired   Hypertension   Mobitz type 2 second degree atrioventricular block   Polymorphic ventricular tachycardia (HCC)   Sepsis ([place])   Stage I pressure ulcer of sacral region   Bacteremia due to Gram-negative bacteria   Gram negative sepsis (HCC)   Palliative care by specialist   Encounter for hospice care discussion   Brief Summary 85 year old female with a history of advanced Alzheimer's dementia, CAD, H/o combined systolic and diastolic dysfunction  CHF/HFrEF/ischemic cardiomyopathy with EF 20 to 25%, h/o torsades VT status post AICD, H/o HTN, DM2, HLD, admitted on 10/12/2019 from home with metabolic encephalopathy in the setting of presumed infection (leukocytosis and Lactic acid was 3.1) with multiple decubitus ulcers -Weaned off IV Levophed on 10/22/19 -Requiring warming blanket from time to time ---Patient still unable to have oral intake due to advanced cognitive deficits--- she will spit food out, clenching her jaw and resist any attempt to feed her  -overall prognosis remains poor due to inability to take oral intake  Assessment/Plan: 1)Severe sepsis with septic shock-- POA -Etiology is multifactorial:-Patient has Klebsiella UTI (urine cx 10/17/19), E. coli and Proteus wound decubitus ulcer/wound infection (wound cx from 11/02/2019 and 10/17/19) and staph epi and Bacteroides bacteremia from blood culture dated 10/18/2019 -Weaned off IV Levophed on 10/22/19 --Stopped vancomycin after 10/21/2019 -MRSA PCR negative on 10/20/2019 -Repeat blood culture from 10/21/2019 NGTD -Patient remains encephalopathic, at baseline she does have advanced dementia  with significant cognitive and memory deficits -WBC is down to 8.7  from a peak of 25.6 -PCT was  2.91, ESR 60, CRP 14.1 - overall prognosis remains poor especially given lack of oral intake -Antimicrobials/Anti-fungal this admission: - Diflucan 3/16 and 3/17  -Ceftriaxone 10/22/2019 through 10/28/2019 inclusive  --metronidazole -11/01/2019 through 10/26/2019 inclusive  --  zosyn x 1 on 10/18/2019 -- cefepime 11/07/2019 through 10/22/19 -Vanc --10/19/2019 through 10/22/19 -Eraxis -10/25/2019 through 10/28/2019 inclusive --Intermittent episodes of hypothermia requiring warming blanket from time to time  2)Bacteremia--B.fragilis and staph epi--- ???  Significance of blood culture from 11/07/2019 -??  Contaminant, however patient was septic --Sepsis pathophysiology and Antibiotics as above #1 -Repeat blood cultures on  10/21/2019 NGTD - 3)Infected Bilateral Hip and Sacral decubitus ulcers--please see photos in epic -10/14/2019 CT abdomen/pelvis--right lateral decubitus ulcer with subcutaneous inflammatory changes extending toward the greater trochanter. There is no bony destructive changes. There is considerable inflammatory change with air tracking toward the lateral aspect of the femur. -Since wounds are bilateral, is difficult to position patient so the pressure is offloaded from wounds -General surgery consultappreciated -10/17/19--bedside debridement -Wound cultures from 11/06/2019 and 10/17/2019 with E. coli and Proteus --Wound culture with fungus reluctant to use Diflucan due to prolonged QT concerns---okay to treat with anidulafungin -Sepsis pathophysiology and Antibiotics as above #1  4) Klebsiella UTI--urine culture from 10/17/2019 noted --Sepsis pathophysiology and Antibiotics as above #1  5)Failure to thrive -Serum B12--199-->supplement -TSH--37.364, Free T4--0.28 -Folate--7.2 -PT evaluation-->SNF -continues to have poor po intake -Cognitive status and encephalopathy makes it difficult to feed orally ---Patient still unable to have oral intake due to advanced cognitive deficits--- she will spit food out, clenching her jaw and resist any attempt to feed her -Continue judicious IV dextrose fluids given that patient's EF is down to 20 to 25% -Borderline blood glucose despite IV dextrose infusion -Ensure supplements refused by patient --Remains very challenging to feed patient as she closes her mouth , clenching her jaw and spits out anything -husband has decided against NG tube/Keofeed Tube and against  PEG/G-tube placement  -  6)Symptomatic acute on chronic blood loss anemia due to Rectal bleeding-hemoglobin is up  to 9.2 from 6.9 after transfusion of 2 units of PRBC on 10/21/2019 hypotension resolved -No further rectal bleeding -Protonix for GI prophylaxis GI consult appreciated suspect  rectal injury from fecal impaction--suppositories as ordered -Low platelets noted (18K) ,  no bleeding noted Suspect thrombocytopenia and anemia of critical illness / - infection and malnutrition related.  7)CKD stage 3b -initially felt to be AKI but now more likely progression of CKD -pt likely has progression of underlying CKD -Creatinine hovering below 2 -No recent renal labs available to compare current values to -renal US-->medical renal disease -Creatinine is down to 1.42 from a peak of 1.83 - renally adjust medications, avoid nephrotoxic agents / dehydration  / hypotension -Very scant urine output in Foley bag -Foley was placed due to urinary retention  8)H/o combined systolic and diastolic dysfunction CHF/HFrEF/ischemic cardiomyopathy with EF 20 to 25%- -02/2015 echo EF 20-25%, G2DD -BP improved with iVF   - c/n Judicious IV fluids  9)Hypernatremia/dehydration -Resolved with IV fluids, -due to continued poor po intake and hypoglycemia--will c/n D5 half-normal as ordered  10)Alzheimer's dementia/ambulatory dysfunction -Continue Namenda -spouse states pt screams frequently at home -At baseline patient has very advanced dementia with severe cognitive and memory deficits, she has been bed-bound for the last 3 months at least, requiring total care  11)Coronary artery disease -hx of CABG in 2009 (LIMA-LAD, SVG-ramus, SVG-OM). Hx of RCA stent 04/2012 -no chest pain presently Discontinued Aspirin and Plavix due to rectal bleeding with symptomatic Anemia -Continue statin -Coreg on hold due to hypotension  12)Social/Ethics--- overall prognosis is poor, patient is a DNR/DNI -Plan of care, goals of care discussed with patient's husband at bedside -Official palliative  care consult appreciated -Family declined comfort care and hospice at this time -Very advanced dementia with significant cognitive and memory deficits--- patient is total care   13)Thrombocytopenia---   Platelets 206 >> 188>>> 117>> 74>> 48>>> 38>>32>>27>>24>>18.  Possibly related to underlying infectious process.  No signs of bleeding at this time.    14)Ventricular Tachycardia/H/o Torsades -status post AICD placement - she has CRT-D device followed by EP - she underwent ICD upgrade of single chamber ICD to [employer] CRT-D  Disposition Plan: Patient From: Home D/C Place: Hosp[facility] at Home [facility]s [facility] Barriers: Not Clinically Stable--poor po intake, severe bacterial infection requiring IV fluids  --Family declined comfort care and hospice at this time -Will also need continued conversations with palliative care around prognosis  Code Status : DNR  Procedures-- bedside decubitus ulcer debridement on 10/17/2019 -Right arm PICC line placement on 10/21/2019 -transfusion of 2 units of PRBC on 10/21/2019 -Bedside decubitus debridement by Dr. Constance Haw on 10/22/2019  Family Communication:    Discussed with husband  Consults  : Palliative care and general surgery  DVT Prophylaxis  :  Teds and SCDs/rectal bleeding/low platelets  Lab Results  Component Value Date   PLT 18 (LL) 10/31/2019   Inpatient Medications  Scheduled Meds: . Chlorhexidine Gluconate Cloth  6 each Topical Daily  . collagenase   Topical BID  . feeding supplement (ENSURE ENLIVE)  237 mL Oral TID  . hydrocortisone  25 mg Rectal BID  . levothyroxine  37.5 mcg Intravenous Q0600  . mouth rinse  15 mL Mouth Rinse BID  . pantoprazole (PROTONIX) IV  40 mg Intravenous Q24H  . polyethylene glycol  17 g Oral Daily  . sodium chloride flush  10-40 mL Intracatheter Q12H   Continuous Infusions: . sodium chloride 10 mL/hr at 10/21/19 2018  . dextrose 5 % and 0.45% NaCl 50 mL/hr at 10/30/19 1146  . potassium chloride 10 mEq (10/31/19 1007)   PRN Meds:.sodium chloride, acetaminophen **OR** acetaminophen, bisacodyl, fentaNYL (SUBLIMAZE) injection, LORazepam, ondansetron (ZOFRAN) IV, sodium chloride  flush   Anti-infectives (From admission, onward)   Start     Dose/Rate Route Frequency Ordered Stop   10/25/19 1500  anidulafungin (ERAXIS) 100 mg in sodium chloride 0.9 % 100 mL IVPB     100 mg 78 mL/hr over 100 Minutes Intravenous Every 24 hours 10/24/19 1728 10/28/19 1803   10/24/19 1400  fluconazole (DIFLUCAN) IVPB 200 mg  Status:  Discontinued     200 mg 100 mL/hr over 60 Minutes Intravenous Every 24 hours 10/23/19 1144 10/24/19 1717   10/23/19 1400  fluconazole (DIFLUCAN) IVPB 400 mg     400 mg 100 mL/hr over 120 Minutes Intravenous  Once 10/23/19 1144 10/23/19 2302   10/22/19 2200  cefTRIAXone (ROCEPHIN) 2 g in sodium chloride 0.9 % 100 mL IVPB     2 g 200 mL/hr over 30 Minutes Intravenous Every 24 hours 10/22/19 1022 10/28/19 2259   10/17/19 2359  vancomycin (VANCOREADY) IVPB 1250 mg/250 mL     1,250 mg 166.7 mL/hr over 90 Minutes Intravenous Every 48 hours 10/08/2019 2022 10/22/19 2222   10/10/2019 2359  metroNIDAZOLE (FLAGYL) IVPB 500 mg  Status:  Discontinued     500 mg 100 mL/hr over 60 Minutes Intravenous Every 8 hours 10/14/2019 2047 10/26/19 1604   11/04/2019 2359  ceFEPIme (MAXIPIME) 2 g in sodium chloride 0.9 % 100 mL IVPB  Status:  Discontinued     2 g 200 mL/hr over 30 Minutes Intravenous Every 24 hours  10/25/2019 2012 10/22/19 1022   10/26/2019 1430  vancomycin (VANCOCIN) IVPB 1000 mg/200 mL premix     1,000 mg 200 mL/hr over 60 Minutes Intravenous  Once 10/08/2019 1428 10/20/2019 1713   11/05/2019 1430  piperacillin-tazobactam (ZOSYN) IVPB 3.375 g     3.375 g 100 mL/hr over 30 Minutes Intravenous  Once 11/02/2019 1428 10/23/2019 1607       Objective:   Vitals:   10/30/19 1343 10/30/19 2059 10/30/19 2059 10/31/19 0557  BP: (!) 118/53 129/66 129/66 (!) 120/51  Pulse: 86 89 92 86  Resp: _0 Temp: 98.4 F (36.9 C) 98.4 F (36.9 C) 98.4 F (36.9 C) 98 F (36.7 C)  TempSrc: Oral Oral Oral Oral  SpO2: 100% 100% 100% 100%  Weight:      Height:        Wt Readings  from Last 3 Encounters:  10/22/19 80.2 kg  06/13/19 78.5 kg  10/19/18 73.6 kg    Intake/Output Summary (Last 24 hours) at 10/31/2019 1102 Last data filed at 10/31/2019 0800 Gross per 24 hour  Intake 1084.02 ml  Output 425 ml  Net 659.02 ml   Physical Exam  General exam: Lethargic, nonverbal Respiratory system: Fair air movement, no wheezing cardiovascular system:RRR.  Prior sternotomy scar Gastrointestinal system: Abdomen is nondistended, soft and nontender, Normal bowel sounds heard. Central nervous system:-Bedbound, no purposeful movements  extremities: No C/C/E, +pedal pulses Skin: --Multiple skin decubitus please see photos in epic psychiatry: Very advanced dementia with significant cognitive and memory deficits--- patient is total care GU-Foley with scant urine MSK-right arm PICC line     Data Review:   Micro Results Recent Results (from the past 240 hour(s))  Culture, blood (Routine X 2) w Reflex to ID Panel     Status: None   Collection Time: 10/21/19  6:10 PM   Specimen: A-Line; Blood  Result Value Ref Range Status   Specimen Description A-LINE  Final   Special Requests   Final    BOTTLES DRAWN AEROBIC AND ANAEROBIC Blood Culture adequate volume   Culture   Final    NO GROWTH 5 DAYS Performed at [facility], [address], [place], [place] [address]    Report Status 10/26/2019 FINAL  Final  Culture, blood (Routine X 2) w Reflex to ID Panel     Status: None   Collection Time: 10/22/19  4:34 AM   Specimen: BLOOD  Result Value Ref Range Status   Specimen Description BLOOD SITE NOT SPECIFIED  Final   Special Requests   Final    BOTTLES DRAWN AEROBIC AND ANAEROBIC Blood Culture adequate volume   Culture   Final    NO GROWTH 5 DAYS Performed at [facility], [address], [place], [place] [address]    Report Status 10/27/2019 FINAL  Final    Radiology Reports CT ABDOMEN PELVIS WO CONTRAST  Result Date: 10/25/2019 CLINICAL DATA:  Decubitus ulcers EXAM: CT  ABDOMEN AND PELVIS WITHOUT CONTRAST TECHNIQUE: Multidetector CT imaging of the abdomen and pelvis was performed following the standard protocol without IV contrast. COMPARISON:  None. FINDINGS: Lower chest: No acute abnormality. Hepatobiliary: Scattered calcifications are noted within the liver. Gallbladder is well distended with multiple dependent gallstones. Pancreas: Unremarkable. No pancreatic ductal dilatation or surrounding inflammatory changes. Spleen: Normal in size without focal abnormality. Adrenals/Urinary Tract: Adrenal glands are within normal limits. Kidneys are well visualized bilaterally with renal vascular calcifications. No renal stones are seen. No obstructive changes are noted. The  bladder is partially distended. Stomach/Bowel: The appendix is not well visualized although no inflammatory changes to suggest appendicitis are noted. Retained fecal material is noted throughout the colon consistent with a mild degree of constipation without obstructive change. No small bowel abnormality is seen. Stomach is unremarkable. Vascular/Lymphatic: Aortic atherosclerosis. No enlarged abdominal or pelvic lymph nodes. Reproductive: Uterus and bilateral adnexa are unremarkable. Other: No abdominal wall hernia or abnormality. No abdominopelvic ascites. Musculoskeletal: Mild changes of anasarca are noted. Decubitus ulcer is noted laterally near the right hip with inflammatory changes extending from the skin towards the greater trochanter. This area measures approximately 7.6 x 4.3 cm. No definitive bony erosive changes to suggest osteomyelitis are noted at this time. No other definitive decubitus ulcer is seen. Degenerative changes of lumbar spine are seen. IMPRESSION: Right lateral decubitus ulcer with subcutaneous inflammatory changes extending towards the greater trochanter of the right proximal femur. No definitive bony destructive changes are seen. Considerable subcutaneous inflammatory changes noted with air  tracking towards the lateral aspect of the femur. Changes of mild constipation. Cholelithiasis without complicating factors. Electronically Signed   By: Inez Catalina M.D.   On: 10/12/2019 16:51   US RENAL  Result Date: 10/18/2019 CLINICAL DATA:  Acute renal insufficiency. EXAM: RENAL / URINARY TRACT ULTRASOUND COMPLETE COMPARISON:  10/27/2019 CT FINDINGS: Right Kidney: Renal measurements: 9.3 x 4.4 x 5.8 cm = volume: 123 mL. Increased renal echogenicity. No hydronephrosis. Left Kidney: Renal measurements: 9.6 x 5.2 x 4.6 cm = volume: 119 mL. Increased renal echogenicity. No hydronephrosis. Bladder: Decompressed. Other: Trace perinephric edema/fluid likely relates to renal insufficiency. Note is made of gallbladder sludge and stones including at 1.0 cm. IMPRESSION: 1. No hydronephrosis. Increased renal echogenicity, consistent with medical renal disease. 2. Cholelithiasis without acute cholecystitis. Electronically Signed   By: Abigail Miyamoto M.D.   On: 10/18/2019 11:51   DG Chest Port 1 View  Result Date: 11/01/2019 CLINICAL DATA:  Sepsis EXAM: PORTABLE CHEST 1 VIEW COMPARISON:  11/05/2014 FINDINGS: Stable positioning of a left-sided implanted cardiac device. Post CABG changes. Stable cardiomediastinal contours. No focal airspace consolidation, pleural effusion, or pneumothorax. IMPRESSION: No acute cardiopulmonary findings. Electronically Signed   By: Davina Poke D.O.   On: 10/28/2019 14:47   [place] EKG SITE RITE  Result Date: 10/20/2019 If Site Rite image not attached, placement could not be confirmed due to current cardiac rhythm.   CBC Recent Labs  Lab 10/25/19 0518 10/27/19 0358 10/28/19 0401 10/29/19 0410 10/31/19 0449  WBC 9.8 12.1* 9.0 8.7 5.6  HGB 9.9* 10.0* 7.4* 9.2* 7.7*  HCT 32.0* 32.4* 24.5* 30.1* 25.2*  PLT 38* 32* 27* 24* 18*  MCV 93.3 95.0 95.3 95.0 94.0  MCH 28.9 29.3 28.8 29.0 28.7  MCHC 30.9 30.9 30.2 30.6 30.6  RDW 17.1* 17.6* 17.4* 17.5* 17.6*    Chemistries   Recent Labs  Lab 10/25/19 0518 10/27/19 0358 10/28/19 0401 10/29/19 0410 10/31/19 0449  NA 138 138 138 141 142  K 3.9 3.5 3.3* 3.3* 3.0*  CL 113* 114* 114* 117* 118*  CO2 16* 15* 15* 16* 17*  GLUCOSE 117* 115* 288* 83 81  BUN 52* 47* 43* 42* 36*  CREATININE 1.51* 1.42* 1.44* 1.42* 1.38*  CALCIUM 7.4* 7.8* 7.4* 8.0* 7.9*  AST 14*  --   --   --   --   ALT 12  --   --   --   --   ALKPHOS 68  --   --   --   --  BILITOT 0.5  --   --   --   --    ------------------------------------------------------------------------------------------------------------------ No results for input(s): CHOL, HDL, LDLCALC, TRIG, CHOLHDL, LDLDIRECT in the last 72 hours.  Lab Results  Component Value Date   HGBA1C 5.5 10/19/2019   ------------------------------------------------------------------------------------------------------------------ No results for input(s): TSH, T4TOTAL, T3FREE, THYROIDAB in the last 72 hours.  Invalid input(s): FREET3 ------------------------------------------------------------------------------------------------------------------ No results for input(s): VITAMINB12, FOLATE, FERRITIN, TIBC, IRON, RETICCTPCT in the last 72 hours.  Coagulation profile Recent Labs  Lab 10/29/19 0410  INR 1.1    No results for input(s): DDIMER in the last 72 hours.  Cardiac Enzymes No results for input(s): CKMB, TROPONINI, MYOGLOBIN in the last 168 hours.  Invalid input(s): CK     Component Value Date/Time   BNP 583.7 (H) 11/01/2014 9409   Roxan Hockey M.D on 10/31/2019 at 11:02 AM  Go to [email or web address] - for contact info  [facility] - Office  [phone]

## 2019-10-31 NOTE — Care Management Important Message (Signed)
Important Message  Patient Details  Name: [name] MRN: [id number] Date of Birth: 1935-05-11   Medicare Important Message Given:  Yes     Corey Harold 10/31/2019, 1:09 PM

## 2019-10-31 NOTE — Progress Notes (Signed)
CRITICAL VALUE ALERT  Critical Value: platetlets 18  Date & Time Notied:  10-31-2019  Provider Notified:emokpae, courage  Orders Received/Actions taken: no new orders at this time

## 2019-11-01 LAB — GLUCOSE, CAPILLARY
Glucose-Capillary: 77 mg/dL (ref 70–99)
Glucose-Capillary: 79 mg/dL (ref 70–99)
Glucose-Capillary: 81 mg/dL (ref 70–99)
Glucose-Capillary: 81 mg/dL (ref 70–99)
Glucose-Capillary: 87 mg/dL (ref 70–99)
Glucose-Capillary: 87 mg/dL (ref 70–99)

## 2019-11-01 NOTE — Progress Notes (Signed)
Patient Demographics:    [name], is a 85 y.o. female, DOB - 05-08-1935, ZOX:[id number]  Admit date - 11/07/2019   Admitting Physician Ejiroghene Arlyce Dice, MD  Outpatient Primary MD for the patient is [name], MD  LOS - 81  Chief Complaint  Patient presents with  . Skin Ulcer       Subjective:   --Appears comfortable, no family at bedside --Opens eyes to verbal and tactile stimuli,  --took ice cream from her husband 10/31/19   Assessment  & Plan :    Principal Problem:   Severe sepsis with septic shock - Klebsiella/Proteus/E. coli/Bacteroides and Staph  Active Problems:   S/P ICD (internal cardiac defibrillator) procedure, with upgrade to ICD/CRT-D  [employer]   Cardiomyopathy, ischemic EF 20 to 25 %/history of combined systolic and diastolic dysfunction CHF   Decubitus ulcer of right hip, stage 4 /Infected with E-coli and Proteus   Failure to thrive in adult   Decubitus ulcer of left hip, stage 3/Infected with E-coli and Proteus   Goals of care, counseling/discussion   Hematochezia   Sepsis secondary to Klebsiella UTI ([place])   Anemia due to acute blood loss/rectal bleeding   Type II diabetes mellitus ([place])   AKI (acute kidney injury) ([place])   Thrombocytopenia Acquired   Hypertension   Mobitz type 2 second degree atrioventricular block   Polymorphic ventricular tachycardia (H[facility])   Sepsis ([place])   Stage I pressure ulcer of sacral region   Bacteremia due to Gram-negative bacteria   Gram negative sepsis ([place])   Palliative care by specialist   Encounter for hospice care discussion   Brief Summary 85 year old female with a history of advanced Alzheimer's dementia, CAD, H/o combined systolic and diastolic dysfunction CHF/HFrEF/ischemic cardiomyopathy with EF 20 to 25%, h/o torsades VT status post AICD, H/o HTN, DM2, HLD, admitted on 11/07/2019 from home with metabolic encephalopathy in the  setting of presumed infection (leukocytosis and Lactic acid was 3.1) with multiple decubitus ulcers -Weaned off IV Levophed on 10/22/19 -Requiring warming blanket from time to time ---Patient still unable to have oral intake due to advanced cognitive deficits--- she will spit food out, clenching her jaw and resist any attempt to feed her  -overall prognosis remains poor due to inability to take oral intake  Assessment/Plan: 1)Severe sepsis with septic shock-- POA -Etiology is multifactorial:-Patient has Klebsiella UTI (urine cx 10/17/19), E. coli and Proteus wound decubitus ulcer/wound infection (wound cx from 11/06/2019 and 10/17/19) and staph epi and Bacteroides bacteremia from blood culture dated 10/15/2019 -Weaned off IV Levophed on 10/22/19 --Stopped vancomycin after 10/21/2019 -MRSA PCR negative on 10/20/2019 -Repeat blood culture from 10/21/2019 NGTD -Patient remains encephalopathic, at baseline she does have advanced dementia  with significant cognitive and memory deficits -WBC is down to 8.7  from a peak of 25.6 -PCT was  2.91, ESR 60, CRP 14.1 - overall prognosis remains poor especially given lack of oral intake -Antimicrobials/Anti-fungal this admission: - Diflucan 3/16 and 3/17  -Ceftriaxone 10/22/2019 through 10/28/2019 inclusive  --metronidazole -10/28/2019 through 10/26/2019 inclusive  --zosyn x 1 on 10/23/2019 -- cefepime 10/27/2019 through 10/22/19 -Vanc --11/05/2019 through 10/22/19 -Eraxis -10/25/2019 through 10/28/2019 inclusive --Intermittent episodes of hypothermia requiring warming blanket from time to time  2)Bacteremia--B.fragilis and staph epi--- ???  Significance of blood culture from 10/08/2019 -??  Contaminant, however patient was septic --Sepsis pathophysiology and Antibiotics as above #1 -Repeat blood cultures on 10/21/2019 NGTD - 3)Infected Bilateral Hip and Sacral decubitus ulcers--please see photos in epic -11/06/2019 CT abdomen/pelvis--right lateral decubitus ulcer with  subcutaneous inflammatory changes extending toward the greater trochanter. There is no bony destructive changes. There is considerable inflammatory change with air tracking toward the lateral aspect of the femur. -Since wounds are bilateral, is difficult to position patient so the pressure is offloaded from wounds -General surgery consultappreciated -10/17/19--bedside debridement -Wound cultures from 10/15/2019 and 10/17/2019 with E. coli and Proteus --Wound culture with fungus reluctant to use Diflucan due to prolonged QT concerns---okay to treat with anidulafungin -Sepsis pathophysiology and Antibiotics as above #1  4) Klebsiella UTI--urine culture from 10/17/2019 noted --Sepsis pathophysiology and Antibiotics as above #1  5)Failure to thrive -Serum B12--199-->supplement -TSH--37.364, Free T4--0.28 -Folate--7.2 -PT evaluation-->SNF -continues to have poor po intake -Cognitive status and encephalopathy makes it difficult to feed orally ---Patient still unable to have oral intake due to advanced cognitive deficits--- she will spit food out, clenching her jaw and resist any attempt to feed her -Continue judicious IV dextrose fluids given that patient's EF is down to 20 to 25% -Borderline blood glucose despite IV dextrose infusion -Ensure supplements refused by patient --Remains very challenging to feed patient as she closes her mouth , clenching her jaw and spits out anything -husband has decided against NG tube/Keofeed Tube and against  PEG/G-tube placement  -  6)Symptomatic acute on chronic blood loss anemia due to Rectal bleeding-hemoglobin is up  to 9.2 from 6.9 after transfusion of 2 units of PRBC on 10/21/2019 hypotension resolved -No further rectal bleeding -Protonix for GI prophylaxis GI consult appreciated suspect rectal injury from fecal impaction--suppositories as ordered -Low platelets noted (18K) ,  no bleeding noted Suspect thrombocytopenia and anemia of critical illness /  - infection and malnutrition related.  7)CKD stage 3b -initially felt to be AKI but now more likely progression of CKD -pt likely has progression of underlying CKD -Creatinine hovering below 2 -No recent renal labs available to compare current values to -renal US-->medical renal disease -Creatinine is down to 1.42 from a peak of 1.83 - renally adjust medications, avoid nephrotoxic agents / dehydration  / hypotension -Very scant urine output in Foley bag -Foley was placed due to urinary retention  8)H/o combined systolic and diastolic dysfunction CHF/HFrEF/ischemic cardiomyopathy with EF 20 to 25%- -02/2015 echo EF 20-25%, G2DD -BP improved with iVF   - c/n Judicious IV fluids  9)Hypernatremia/dehydration -Resolved with IV fluids, -due to continued poor po intake and hypoglycemia--will c/n D5 half-normal as ordered  10)Alzheimer's dementia/ambulatory dysfunction -Continue Namenda -spouse states pt screams frequently at home -At baseline patient has very advanced dementia with severe cognitive and memory deficits, she has been bed-bound for the last 3 months at least, requiring total care  11)Coronary artery disease -hx of CABG in 2009 (LIMA-LAD, SVG-ramus, SVG-OM). Hx of RCA stent 04/2012 -no chest pain presently Discontinued Aspirin and Plavix due to rectal bleeding with symptomatic Anemia -Continue statin -Coreg on hold due to hypotension  12)Social/Ethics--- overall prognosis is poor, patient is a DNR/DNI -Plan of care, goals of care discussed with patient's husband at bedside -Official palliative care consult appreciated -Family declined comfort care and hospice at this time -Very advanced dementia with significant cognitive and memory deficits--- patient is total care   13)Thrombocytopenia---  Platelets 206 >> 188>>> 117>> 74>> 48>>> 38>>32>>27>>24>>18.  Possibly related to underlying infectious process.  No signs of bleeding at this time.   --Consider transfusion if  family desires if platelet count below 10K or if bleeding occurs  14)Ventricular Tachycardia/H/o Torsades -status post AICD placement - she has CRT-D device followed by EP - she underwent ICD upgrade of single chamber ICD to [employer] CRT-D  Disposition Plan: Patient From: Home D/C Place: [facility] at Home [facility] Barriers: Not Clinically Stable--poor po intake, severe bacterial infection requiring IV fluids  --Family declined comfort care and hospice at this time -Will also need continued conversations with palliative care around prognosis  Code Status : DNR  Procedures-- bedside decubitus ulcer debridement on 10/17/2019 -Right arm PICC line placement on 10/21/2019 -transfusion of 2 units of PRBC on 10/21/2019 -Bedside decubitus debridement by Dr. Constance Haw on 10/22/2019  Family Communication:    Discussed with husband  Consults  : Palliative care and general surgery  DVT Prophylaxis  :  Teds and SCDs/rectal bleeding/low platelets  Lab Results  Component Value Date   PLT 18 (LL) 10/31/2019   Inpatient Medications  Scheduled Meds: . Chlorhexidine Gluconate Cloth  6 each Topical Daily  . collagenase   Topical BID  . feeding supplement (ENSURE ENLIVE)  237 mL Oral TID  . hydrocortisone  25 mg Rectal BID  . levothyroxine  37.5 mcg Intravenous Q0600  . mouth rinse  15 mL Mouth Rinse BID  . pantoprazole (PROTONIX) IV  40 mg Intravenous Q24H  . polyethylene glycol  17 g Oral Daily  . sodium chloride flush  10-40 mL Intracatheter Q12H   Continuous Infusions: . sodium chloride 10 mL/hr at 10/21/19 2018  . dextrose 5 % and 0.45% NaCl 60 mL/hr at 11/01/19 1051   PRN Meds:.sodium chloride, acetaminophen **OR** acetaminophen, bisacodyl, fentaNYL (SUBLIMAZE) injection, LORazepam, ondansetron (ZOFRAN) IV, sodium chloride flush   Anti-infectives (From admission, onward)   Start     Dose/Rate Route Frequency Ordered Stop   10/25/19 1500  anidulafungin (ERAXIS) 100 mg  in sodium chloride 0.9 % 100 mL IVPB     100 mg 78 mL/hr over 100 Minutes Intravenous Every 24 hours 10/24/19 1728 10/28/19 1803   10/24/19 1400  fluconazole (DIFLUCAN) IVPB 200 mg  Status:  Discontinued     200 mg 100 mL/hr over 60 Minutes Intravenous Every 24 hours 10/23/19 1144 10/24/19 1717   10/23/19 1400  fluconazole (DIFLUCAN) IVPB 400 mg     400 mg 100 mL/hr over 120 Minutes Intravenous  Once 10/23/19 1144 10/23/19 2302   10/22/19 2200  cefTRIAXone (ROCEPHIN) 2 g in sodium chloride 0.9 % 100 mL IVPB     2 g 200 mL/hr over 30 Minutes Intravenous Every 24 hours 10/22/19 1022 10/28/19 2259   10/17/19 2359  vancomycin (VANCOREADY) IVPB 1250 mg/250 mL     1,250 mg 166.7 mL/hr over 90 Minutes Intravenous Every 48 hours 10/25/2019 2022 10/22/19 2222   11/02/2019 2359  metroNIDAZOLE (FLAGYL) IVPB 500 mg  Status:  Discontinued     500 mg 100 mL/hr over 60 Minutes Intravenous Every 8 hours 10/15/2019 2047 10/26/19 1604   11/05/2019 2359  ceFEPIme (MAXIPIME) 2 g in sodium chloride 0.9 % 100 mL IVPB  Status:  Discontinued     2 g 200 mL/hr over 30 Minutes Intravenous Every 24 hours 10/12/2019 2012 10/22/19 1022   10/21/2019 1430  vancomycin (VANCOCIN) IVPB 1000 mg/200 mL premix     1,000 mg 200 mL/hr over 60 Minutes Intravenous  Once 10/08/2019 1428  10/22/2019 1713   10/24/2019 1430  piperacillin-tazobactam (ZOSYN) IVPB 3.375 g     3.375 g 100 mL/hr over 30 Minutes Intravenous  Once 10/20/2019 1428 10/25/2019 1607       Objective:   Vitals:   11/01/19 0500 11/01/19 0539 11/01/19 0838 11/01/19 1351  BP: (!) 135/59   (!) 109/55  Pulse: 75   62  Resp: 16   20  Temp: (!) 96 F (35.6 C) (!) 97 F (36.1 C)  (!) 97.3 F (36.3 C)  TempSrc: Axillary Oral    SpO2: 100%  97%   Weight:      Height:        Wt Readings from Last 3 Encounters:  10/22/19 80.2 kg  06/13/19 78.5 kg  10/19/18 73.6 kg    Intake/Output Summary (Last 24 hours) at 11/01/2019 1443 Last data filed at 11/01/2019 0500 Gross per 24  hour  Intake 1844.88 ml  Output 375 ml  Net 1469.88 ml   Physical Exam  General exam: Lethargic, nonverbal Respiratory system: Fair air movement, no wheezing cardiovascular system:RRR.  Prior sternotomy scar Gastrointestinal system: Abdomen is nondistended, soft and nontender, Normal bowel sounds heard. Central nervous system:-Bedbound, no purposeful movements  extremities: No C/C/E, +pedal pulses Skin: --Multiple skin decubitus please see photos in epic psychiatry: Very advanced dementia with significant cognitive and memory deficits--- patient is total care GU-Foley with scant urine MSK-right arm PICC line     Data Review:   Micro Results No results found for this or any previous visit (from the past 240 hour(s)).  Radiology Reports CT ABDOMEN PELVIS WO CONTRAST  Result Date: 10/25/2019 CLINICAL DATA:  Decubitus ulcers EXAM: CT ABDOMEN AND PELVIS WITHOUT CONTRAST TECHNIQUE: Multidetector CT imaging of the abdomen and pelvis was performed following the standard protocol without IV contrast. COMPARISON:  None. FINDINGS: Lower chest: No acute abnormality. Hepatobiliary: Scattered calcifications are noted within the liver. Gallbladder is well distended with multiple dependent gallstones. Pancreas: Unremarkable. No pancreatic ductal dilatation or surrounding inflammatory changes. Spleen: Normal in size without focal abnormality. Adrenals/Urinary Tract: Adrenal glands are within normal limits. Kidneys are well visualized bilaterally with renal vascular calcifications. No renal stones are seen. No obstructive changes are noted. The bladder is partially distended. Stomach/Bowel: The appendix is not well visualized although no inflammatory changes to suggest appendicitis are noted. Retained fecal material is noted throughout the colon consistent with a mild degree of constipation without obstructive change. No small bowel abnormality is seen. Stomach is unremarkable. Vascular/Lymphatic: Aortic  atherosclerosis. No enlarged abdominal or pelvic lymph nodes. Reproductive: Uterus and bilateral adnexa are unremarkable. Other: No abdominal wall hernia or abnormality. No abdominopelvic ascites. Musculoskeletal: Mild changes of anasarca are noted. Decubitus ulcer is noted laterally near the right hip with inflammatory changes extending from the skin towards the greater trochanter. This area measures approximately 7.6 x 4.3 cm. No definitive bony erosive changes to suggest osteomyelitis are noted at this time. No other definitive decubitus ulcer is seen. Degenerative changes of lumbar spine are seen. IMPRESSION: Right lateral decubitus ulcer with subcutaneous inflammatory changes extending towards the greater trochanter of the right proximal femur. No definitive bony destructive changes are seen. Considerable subcutaneous inflammatory changes noted with air tracking towards the lateral aspect of the femur. Changes of mild constipation. Cholelithiasis without complicating factors. Electronically Signed   By: Inez Catalina M.D.   On: 11/06/2019 16:51   US RENAL  Result Date: 10/18/2019 CLINICAL DATA:  Acute renal insufficiency. EXAM: RENAL / URINARY TRACT ULTRASOUND  COMPLETE COMPARISON:  10/27/2019 CT FINDINGS: Right Kidney: Renal measurements: 9.3 x 4.4 x 5.8 cm = volume: 123 mL. Increased renal echogenicity. No hydronephrosis. Left Kidney: Renal measurements: 9.6 x 5.2 x 4.6 cm = volume: 119 mL. Increased renal echogenicity. No hydronephrosis. Bladder: Decompressed. Other: Trace perinephric edema/fluid likely relates to renal insufficiency. Note is made of gallbladder sludge and stones including at 1.0 cm. IMPRESSION: 1. No hydronephrosis. Increased renal echogenicity, consistent with medical renal disease. 2. Cholelithiasis without acute cholecystitis. Electronically Signed   By: Abigail Miyamoto M.D.   On: 10/18/2019 11:51   DG Chest Port 1 View  Result Date: 10/20/2019 CLINICAL DATA:  Sepsis EXAM: PORTABLE  CHEST 1 VIEW COMPARISON:  11/05/2014 FINDINGS: Stable positioning of a left-sided implanted cardiac device. Post CABG changes. Stable cardiomediastinal contours. No focal airspace consolidation, pleural effusion, or pneumothorax. IMPRESSION: No acute cardiopulmonary findings. Electronically Signed   By: Davina Poke D.O.   On: 10/13/2019 14:47   [place] EKG SITE RITE  Result Date: 10/20/2019 If Site Rite image not attached, placement could not be confirmed due to current cardiac rhythm.   CBC Recent Labs  Lab 10/27/19 0358 10/28/19 0401 10/29/19 0410 10/31/19 0449  WBC 12.1* 9.0 8.7 5.6  HGB 10.0* 7.4* 9.2* 7.7*  HCT 32.4* 24.5* 30.1* 25.2*  PLT 32* 27* 24* 18*  MCV 95.0 95.3 95.0 94.0  MCH 29.3 28.8 29.0 28.7  MCHC 30.9 30.2 30.6 30.6  RDW 17.6* 17.4* 17.5* 17.6*    Chemistries  Recent Labs  Lab 10/27/19 0358 10/28/19 0401 10/29/19 0410 10/31/19 0449  NA 138 138 141 142  K 3.5 3.3* 3.3* 3.0*  CL 114* 114* 117* 118*  CO2 15* 15* 16* 17*  GLUCOSE 115* 288* 83 81  BUN 47* 43* 42* 36*  CREATININE 1.42* 1.44* 1.42* 1.38*  CALCIUM 7.8* 7.4* 8.0* 7.9*   ------------------------------------------------------------------------------------------------------------------ No results for input(s): CHOL, HDL, LDLCALC, TRIG, CHOLHDL, LDLDIRECT in the last 72 hours.  Lab Results  Component Value Date   HGBA1C 5.5 10/19/2019   ------------------------------------------------------------------------------------------------------------------ No results for input(s): TSH, T4TOTAL, T3FREE, THYROIDAB in the last 72 hours.  Invalid input(s): FREET3 ------------------------------------------------------------------------------------------------------------------ No results for input(s): VITAMINB12, FOLATE, FERRITIN, TIBC, IRON, RETICCTPCT in the last 72 hours.  Coagulation profile Recent Labs  Lab 10/29/19 0410  INR 1.1    No results for input(s): DDIMER in the last 72 hours.   Cardiac Enzymes No results for input(s): CKMB, TROPONINI, MYOGLOBIN in the last 168 hours.  Invalid input(s): CK     Component Value Date/Time   BNP 583.7 (H) 11/01/2014 9163   Roxan Hockey M.D on 11/01/2019 at 2:43 PM  Go to [email or web address] - for contact info  [facility] - Office  [phone]

## 2019-11-01 NOTE — Progress Notes (Signed)
Palliative:  I came to visit with [name]. No family at bedside. [name] does slightly open her eyes for just a few moments when I call her name and touch her arm. This was the only response that I was able to elicit. Breathing is regular and unlabored. She does appear comfortable. Continues with overall failure to thrive and not eating/drinking enough to sustain. Family have continued to decline full comfort care and request continued care with hopes of improvement. Currently she is being sustained on dextrose infusion although this will eventually have its limits as I have previously shared with family.   No charge  Yong Channel, NP Palliative Medicine Team Pager [phone] (Please see [email or web address] for schedule) Team Phone [phone]

## 2019-11-02 LAB — BASIC METABOLIC PANEL
Anion gap: 8 (ref 5–15)
BUN: 31 mg/dL — ABNORMAL HIGH (ref 8–23)
CO2: 17 mmol/L — ABNORMAL LOW (ref 22–32)
Calcium: 7.8 mg/dL — ABNORMAL LOW (ref 8.9–10.3)
Chloride: 117 mmol/L — ABNORMAL HIGH (ref 98–111)
Creatinine, Ser: 1.3 mg/dL — ABNORMAL HIGH (ref 0.44–1.00)
GFR calc Af Amer: 44 mL/min — ABNORMAL LOW (ref >60)
GFR calc non Af Amer: 38 mL/min — ABNORMAL LOW (ref >60)
Glucose, Bld: 98 mg/dL (ref 70–99)
Potassium: 3.4 mmol/L — ABNORMAL LOW (ref 3.5–5.1)
Sodium: 142 mmol/L (ref 135–145)

## 2019-11-02 LAB — GLUCOSE, CAPILLARY
Glucose-Capillary: 80 mg/dL (ref 70–99)
Glucose-Capillary: 92 mg/dL (ref 70–99)
Glucose-Capillary: 75 mg/dL (ref 70–99)
Glucose-Capillary: 65 mg/dL — ABNORMAL LOW (ref 70–99)
Glucose-Capillary: 120 mg/dL — ABNORMAL HIGH (ref 70–99)
Glucose-Capillary: 108 mg/dL — ABNORMAL HIGH (ref 70–99)

## 2019-11-02 LAB — CBC
HCT: 25.6 % — ABNORMAL LOW (ref 36.0–46.0)
Hemoglobin: 8.1 g/dL — ABNORMAL LOW (ref 12.0–15.0)
MCH: 29.9 pg (ref 26.0–34.0)
MCHC: 31.6 g/dL (ref 30.0–36.0)
MCV: 94.5 fL (ref 80.0–100.0)
Platelets: 19 10*3/uL — CL (ref 150–400)
RBC: 2.71 MIL/uL — ABNORMAL LOW (ref 3.87–5.11)
RDW: 17.9 % — ABNORMAL HIGH (ref 11.5–15.5)
WBC: 5.5 10*3/uL (ref 4.0–10.5)
nRBC: 0 % (ref 0.0–0.2)

## 2019-11-02 MED ORDER — DEXTROSE 50 % IV SOLN
INTRAVENOUS | Status: AC
  Administered 2019-11-02: 50 mL via INTRAVENOUS
  Filled 2019-11-02: qty 50

## 2019-11-02 MED ORDER — POTASSIUM CHLORIDE 10 MEQ/100ML IV SOLN
10.0000 meq | INTRAVENOUS | Status: AC
  Administered 2019-11-02 (×4): 10 meq via INTRAVENOUS
  Filled 2019-11-02 (×4): qty 100

## 2019-11-02 MED ORDER — DEXTROSE 50 % IV SOLN: 50.0000 mL | Freq: Once | INTRAVENOUS | Status: AC

## 2019-11-02 NOTE — Progress Notes (Signed)
Patient Demographics:    [name], is a 85 y.o. female, DOB - 05-08-1935, MGN:[id number]  Admit date - 10/15/2019   Admitting Physician Ejiroghene Arlyce Dice, MD  Outpatient Primary MD for the patient is [name], MD  LOS - 81  Chief Complaint  Patient presents with  . Skin Ulcer       Subjective:   --- Resting comfortably -Actually opened her eyes this morning --Able to say a couple of words----sounds like she was telling me her name --No family at bedside --Attempted to feed but patient refused oral intake clenching her jaw and clamping the lips together -Low potassium noted, will replace IV   Assessment  & Plan :    Principal Problem:   Severe sepsis with septic shock - Klebsiella/Proteus/E. coli/Bacteroides and Staph  Active Problems:   S/P ICD (internal cardiac defibrillator) procedure, with upgrade to ICD/CRT-D  [employer]   Cardiomyopathy, ischemic EF 20 to 25 %/history of combined systolic and diastolic dysfunction CHF   Decubitus ulcer of right hip, stage 4 /Infected with E-coli and Proteus   Failure to thrive in adult   Decubitus ulcer of left hip, stage 3/Infected with E-coli and Proteus   Goals of care, counseling/discussion   Hematochezia   Sepsis secondary to Klebsiella UTI ([place])   Anemia due to acute blood loss/rectal bleeding   Type II diabetes mellitus (HCC)   AKI (acute kidney injury) ([place])   Thrombocytopenia Acquired   Hypertension   Mobitz type 2 second degree atrioventricular block   Polymorphic ventricular tachycardia (HCC)   Sepsis ([place])   Stage I pressure ulcer of sacral region   Bacteremia due to Gram-negative bacteria   Gram negative sepsis ([place])   Palliative care by specialist   Encounter for hospice care discussion   Brief Summary 85 year old female with a history of advanced Alzheimer's dementia, CAD, H/o combined systolic and diastolic dysfunction  CHF/HFrEF/ischemic cardiomyopathy with EF 20 to 25%, h/o torsades VT status post AICD, H/o HTN, DM2, HLD, admitted on 10/30/2019 from home with metabolic encephalopathy in the setting of presumed infection (leukocytosis and Lactic acid was 3.1) with multiple decubitus ulcers -Weaned off IV Levophed on 10/22/19 -Requiring warming blanket from time to time ---Patient still unable to have oral intake due to advanced cognitive deficits--- she will spit food out, clenching her jaw and resist any attempt to feed her  -overall prognosis remains poor due to inability to take oral intake  Assessment/Plan: 1)Severe sepsis with septic shock-- POA -Etiology is multifactorial:-Patient has Klebsiella UTI (urine cx 10/17/19), E. coli and Proteus wound decubitus ulcer/wound infection (wound cx from 10/18/2019 and 10/17/19) and staph epi and Bacteroides bacteremia from blood culture dated 10/23/2019 -Weaned off IV Levophed on 10/22/19 --Stopped vancomycin after 10/21/2019 -MRSA PCR negative on 10/20/2019 -Repeat blood culture from 10/21/2019 NGTD -Patient remains encephalopathic, at baseline she does have advanced dementia  with significant cognitive and memory deficits -WBC is down to 8.7  from a peak of 25.6 -PCT was  2.91, ESR 60, CRP 14.1 - overall prognosis remains poor especially given lack of oral intake -Antimicrobials/Anti-fungal this admission: - Diflucan 3/16 and 3/17  -Ceftriaxone 10/22/2019 through 10/28/2019 inclusive  --metronidazole -10/29/2019 through 10/26/2019 inclusive  --zosyn x 1 on 10/14/2019 -- cefepime 11/03/2019 through  10/22/19 -Vanc --10/08/2019 through 10/22/19 -Eraxis -10/25/2019 through 10/28/2019 inclusive --Intermittent episodes of hypothermia requiring warming blanket from time to time--hypothermia resolved at this time  2)Bacteremia--B.fragilis and staph epi--- ???  Significance of blood culture from 10/30/2019 -??  Contaminant, however patient was septic --Sepsis pathophysiology and Antibiotics as  above #1 -Repeat blood cultures on 10/21/2019 NGTD - 3)Infected Bilateral Hip and Sacral decubitus ulcers--please see photos in epic -10/27/2019 CT abdomen/pelvis--right lateral decubitus ulcer with subcutaneous inflammatory changes extending toward the greater trochanter. There is no bony destructive changes. There is considerable inflammatory change with air tracking toward the lateral aspect of the femur. -Since wounds are bilateral, is difficult to position patient so the pressure is offloaded from wounds -General surgery consultappreciated -10/17/19--bedside debridement -Wound cultures from 10/11/2019 and 10/17/2019 with E. coli and Proteus --Wound culture with fungus reluctant to use Diflucan due to prolonged QT concerns---okay to treat with anidulafungin -Sepsis pathophysiology and Antibiotics as above #1  4) Klebsiella UTI--urine culture from 10/17/2019 noted --Sepsis pathophysiology and Antibiotics as above #1  5)Failure to thrive -Serum B12--199-->supplement -TSH--37.364, Free T4--0.28 -Folate--7.2 -PT evaluation-->SNF -continues to have poor po intake -Cognitive status and encephalopathy makes it difficult to feed orally ---Patient still unable to have oral intake due to advanced cognitive deficits--- she will spit food out, clenching her jaw and resist any attempt to feed her -Continue judicious IV dextrose fluids given that patient's EF is down to 20 to 25% -Borderline blood glucose despite IV dextrose infusion --Remains very challenging to feed patient as she closes her mouth , clenching her jaw and spits out anything -husband has decided against NG tube/Keofeed Tube and against  PEG/G-tube placement  -  6)Symptomatic acute on chronic blood loss anemia due to Rectal bleeding-hemoglobin is up  to 8.1 from 6.9 after transfusion of 2 units of PRBC on 10/21/2019 hypotension resolved -No further rectal bleeding -Protonix for GI prophylaxis GI consult appreciated suspect rectal  injury from fecal impaction--suppositories as ordered -Low platelets noted (19K) ,  no bleeding noted Suspect thrombocytopenia and anemia of critical illness / - infection and malnutrition related. -Consider transfusion if family desires if platelet count below 10K or if bleeding occurs  7)CKD stage 3b -initially felt to be AKI but now more likely progression of CKD -pt likely has progression of underlying CKD -Creatinine hovering below 2 -No recent renal labs available to compare current values to -renal US-->medical renal disease -Creatinine is down to 1.3 from a peak of 1.83 - renally adjust medications, avoid nephrotoxic agents / dehydration  / hypotension -Very scant urine output in Foley bag -Foley was placed due to urinary retention  8)H/o combined systolic and diastolic dysfunction CHF/HFrEF/ischemic cardiomyopathy with EF 20 to 25%- -02/2015 echo EF 20-25%, G2DD -BP improved with iVF   - c/n Judicious IV fluids  9)Hypernatremia/dehydration -Resolved with IV fluids, -due to continued poor po intake and hypoglycemia--will c/n D5 half-normal as ordered  10)Alzheimer's dementia/ambulatory dysfunction -Continue Namenda -spouse states pt screams frequently at home -At baseline patient has very advanced dementia with severe cognitive and memory deficits, she has been bed-bound for the last 3 months PTA, requiring total care  11)Coronary artery disease -hx of CABG in 2009 (LIMA-LAD, SVG-ramus, SVG-OM). Hx of RCA stent 04/2012 -no chest pain presently Discontinued Aspirin and Plavix due to rectal bleeding with symptomatic Anemia -Continue statin -Coreg on hold due to hypotension  12)Social/Ethics--- overall prognosis is poor, patient is a DNR/DNI -Plan of care, goals of care discussed with patient's husband at  bedside -Official palliative care consult appreciated -Family declined comfort care and hospice at this time -Very advanced dementia with significant cognitive and  memory deficits--- patient is total care   13)Thrombocytopenia---  Platelets 206 >> 188>>> 117>> 74>> 48>>> 38>>32>>27>>24>>19.  Possibly related to underlying infectious process.  No signs of bleeding at this time.   --Consider transfusion if family desires if platelet count below 10K or if bleeding occurs  14)Ventricular Tachycardia/H/o Torsades -status post AICD placement - she has CRT-D device followed by EP - she underwent ICD upgrade of single chamber ICD to [employer] CRT-D  Disposition Plan: Patient From: Home D/C Place: [facility] at Home [facility] Barriers: Not Clinically Stable--poor po intake, severe bacterial infection requiring IV fluids  --Family declined comfort care and hospice at this time -Will also need continued conversations with palliative care around prognosis  Code Status : DNR  Procedures-- bedside decubitus ulcer debridement on 10/17/2019 -Right arm PICC line placement on 10/21/2019 -transfusion of 2 units of PRBC on 10/21/2019 -Bedside decubitus debridement by Dr. Constance Haw on 10/22/2019  Family Communication:    Discussed with husband  Consults  : Palliative care and general surgery  DVT Prophylaxis  :  Teds and SCDs/rectal bleeding/low platelets  Lab Results  Component Value Date   PLT 19 (LL) 11/02/2019   Inpatient Medications  Scheduled Meds: . Chlorhexidine Gluconate Cloth  6 each Topical Daily  . collagenase   Topical BID  . feeding supplement (ENSURE ENLIVE)  237 mL Oral TID  . hydrocortisone  25 mg Rectal BID  . levothyroxine  37.5 mcg Intravenous Q0600  . mouth rinse  15 mL Mouth Rinse BID  . pantoprazole (PROTONIX) IV  40 mg Intravenous Q24H  . polyethylene glycol  17 g Oral Daily  . sodium chloride flush  10-40 mL Intracatheter Q12H   Continuous Infusions: . sodium chloride 10 mL/hr at 10/21/19 2018  . dextrose 5 % and 0.45% NaCl 60 mL/hr at 11/02/19 0949  . potassium chloride 10 mEq (11/02/19 0951)   PRN  Meds:.sodium chloride, acetaminophen **OR** acetaminophen, bisacodyl, fentaNYL (SUBLIMAZE) injection, LORazepam, ondansetron (ZOFRAN) IV, sodium chloride flush   Anti-infectives (From admission, onward)   Start     Dose/Rate Route Frequency Ordered Stop   10/25/19 1500  anidulafungin (ERAXIS) 100 mg in sodium chloride 0.9 % 100 mL IVPB     100 mg 78 mL/hr over 100 Minutes Intravenous Every 24 hours 10/24/19 1728 10/28/19 1803   10/24/19 1400  fluconazole (DIFLUCAN) IVPB 200 mg  Status:  Discontinued     200 mg 100 mL/hr over 60 Minutes Intravenous Every 24 hours 10/23/19 1144 10/24/19 1717   10/23/19 1400  fluconazole (DIFLUCAN) IVPB 400 mg     400 mg 100 mL/hr over 120 Minutes Intravenous  Once 10/23/19 1144 10/23/19 2302   10/22/19 2200  cefTRIAXone (ROCEPHIN) 2 g in sodium chloride 0.9 % 100 mL IVPB     2 g 200 mL/hr over 30 Minutes Intravenous Every 24 hours 10/22/19 1022 10/28/19 2259   10/17/19 2359  vancomycin (VANCOREADY) IVPB 1250 mg/250 mL     1,250 mg 166.7 mL/hr over 90 Minutes Intravenous Every 48 hours 10/12/2019 2022 10/22/19 2222   10/18/2019 2359  metroNIDAZOLE (FLAGYL) IVPB 500 mg  Status:  Discontinued     500 mg 100 mL/hr over 60 Minutes Intravenous Every 8 hours 10/18/2019 2047 10/26/19 1604   10/22/2019 2359  ceFEPIme (MAXIPIME) 2 g in sodium chloride 0.9 % 100 mL IVPB  Status:  Discontinued     2 g 200 mL/hr over 30 Minutes Intravenous Every 24 hours 11/05/2019 2012 10/22/19 1022   10/26/2019 1430  vancomycin (VANCOCIN) IVPB 1000 mg/200 mL premix     1,000 mg 200 mL/hr over 60 Minutes Intravenous  Once 10/18/2019 1428 10/25/2019 1713   10/19/2019 1430  piperacillin-tazobactam (ZOSYN) IVPB 3.375 g     3.375 g 100 mL/hr over 30 Minutes Intravenous  Once 10/26/2019 1428 10/08/2019 1607       Objective:   Vitals:   11/01/19 1351 11/01/19 2003 11/02/19 0537 11/02/19 0831  BP: (!) 109/55 134/66 111/71   Pulse: 62 86 86   Resp: 20 20    Temp: (!) 97.3 F (36.3 C) (!) 96.2 F  (35.7 C) (!) 97.5 F (36.4 C)   TempSrc:  Axillary Axillary   SpO2:  100% 100% 98%  Weight:      Height:        Wt Readings from Last 3 Encounters:  10/22/19 80.2 kg  06/13/19 78.5 kg  10/19/18 73.6 kg    Intake/Output Summary (Last 24 hours) at 11/02/2019 1038 Last data filed at 11/02/2019 0900 Gross per 24 hour  Intake 0 ml  Output 250 ml  Net -250 ml   Physical Exam  General exam: Lethargic, nonverbal (seldom says her name) Respiratory system: Fair air movement, no wheezing Cardiovascular system:RRR.  Prior sternotomy scar Gastrointestinal system: Abdomen is nondistended, soft and nontender, Normal bowel sounds heard. Central nervous system:-Bedbound, no purposeful movements   Extremities: No C/C/E, +pedal pulses Skin: --Multiple skin decubitus please see photos in epic Psychiatry: Very advanced dementia with significant cognitive and memory deficits--- patient is total care GU-Foley with scant urine MSK-right arm PICC line     Data Review:   Micro Results No results found for this or any previous visit (from the past 240 hour(s)).  Radiology Reports CT ABDOMEN PELVIS WO CONTRAST  Result Date: 10/27/2019 CLINICAL DATA:  Decubitus ulcers EXAM: CT ABDOMEN AND PELVIS WITHOUT CONTRAST TECHNIQUE: Multidetector CT imaging of the abdomen and pelvis was performed following the standard protocol without IV contrast. COMPARISON:  None. FINDINGS: Lower chest: No acute abnormality. Hepatobiliary: Scattered calcifications are noted within the liver. Gallbladder is well distended with multiple dependent gallstones. Pancreas: Unremarkable. No pancreatic ductal dilatation or surrounding inflammatory changes. Spleen: Normal in size without focal abnormality. Adrenals/Urinary Tract: Adrenal glands are within normal limits. Kidneys are well visualized bilaterally with renal vascular calcifications. No renal stones are seen. No obstructive changes are noted. The bladder is partially distended.  Stomach/Bowel: The appendix is not well visualized although no inflammatory changes to suggest appendicitis are noted. Retained fecal material is noted throughout the colon consistent with a mild degree of constipation without obstructive change. No small bowel abnormality is seen. Stomach is unremarkable. Vascular/Lymphatic: Aortic atherosclerosis. No enlarged abdominal or pelvic lymph nodes. Reproductive: Uterus and bilateral adnexa are unremarkable. Other: No abdominal wall hernia or abnormality. No abdominopelvic ascites. Musculoskeletal: Mild changes of anasarca are noted. Decubitus ulcer is noted laterally near the right hip with inflammatory changes extending from the skin towards the greater trochanter. This area measures approximately 7.6 x 4.3 cm. No definitive bony erosive changes to suggest osteomyelitis are noted at this time. No other definitive decubitus ulcer is seen. Degenerative changes of lumbar spine are seen. IMPRESSION: Right lateral decubitus ulcer with subcutaneous inflammatory changes extending towards the greater trochanter of the right proximal femur. No definitive bony destructive changes are seen. Considerable subcutaneous inflammatory changes noted  with air tracking towards the lateral aspect of the femur. Changes of mild constipation. Cholelithiasis without complicating factors. Electronically Signed   By: Inez Catalina M.D.   On: 10/15/2019 16:51   US RENAL  Result Date: 10/18/2019 CLINICAL DATA:  Acute renal insufficiency. EXAM: RENAL / URINARY TRACT ULTRASOUND COMPLETE COMPARISON:  10/23/2019 CT FINDINGS: Right Kidney: Renal measurements: 9.3 x 4.4 x 5.8 cm = volume: 123 mL. Increased renal echogenicity. No hydronephrosis. Left Kidney: Renal measurements: 9.6 x 5.2 x 4.6 cm = volume: 119 mL. Increased renal echogenicity. No hydronephrosis. Bladder: Decompressed. Other: Trace perinephric edema/fluid likely relates to renal insufficiency. Note is made of gallbladder sludge and  stones including at 1.0 cm. IMPRESSION: 1. No hydronephrosis. Increased renal echogenicity, consistent with medical renal disease. 2. Cholelithiasis without acute cholecystitis. Electronically Signed   By: Abigail Miyamoto M.D.   On: 10/18/2019 11:51   DG Chest Port 1 View  Result Date: 11/02/2019 CLINICAL DATA:  Sepsis EXAM: PORTABLE CHEST 1 VIEW COMPARISON:  11/05/2014 FINDINGS: Stable positioning of a left-sided implanted cardiac device. Post CABG changes. Stable cardiomediastinal contours. No focal airspace consolidation, pleural effusion, or pneumothorax. IMPRESSION: No acute cardiopulmonary findings. Electronically Signed   By: Davina Poke D.O.   On: 11/06/2019 14:47   [place] EKG SITE RITE  Result Date: 10/20/2019 If Site Rite image not attached, placement could not be confirmed due to current cardiac rhythm.   CBC Recent Labs  Lab 10/27/19 0358 10/28/19 0401 10/29/19 0410 10/31/19 0449 11/02/19 0505  WBC 12.1* 9.0 8.7 5.6 5.5  HGB 10.0* 7.4* 9.2* 7.7* 8.1*  HCT 32.4* 24.5* 30.1* 25.2* 25.6*  PLT 32* 27* 24* 18* 19*  MCV 95.0 95.3 95.0 94.0 94.5  MCH 29.3 28.8 29.0 28.7 29.9  MCHC 30.9 30.2 30.6 30.6 31.6  RDW 17.6* 17.4* 17.5* 17.6* 17.9*    Chemistries  Recent Labs  Lab 10/27/19 0358 10/28/19 0401 10/29/19 0410 10/31/19 0449 11/02/19 0505  NA 138 138 141 142 142  K 3.5 3.3* 3.3* 3.0* 3.4*  CL 114* 114* 117* 118* 117*  CO2 15* 15* 16* 17* 17*  GLUCOSE 115* 288* 83 81 98  BUN 47* 43* 42* 36* 31*  CREATININE 1.42* 1.44* 1.42* 1.38* 1.30*  CALCIUM 7.8* 7.4* 8.0* 7.9* 7.8*   ------------------------------------------------------------------------------------------------------------------ No results for input(s): CHOL, HDL, LDLCALC, TRIG, CHOLHDL, LDLDIRECT in the last 72 hours.  Lab Results  Component Value Date   HGBA1C 5.5 10/19/2019   ------------------------------------------------------------------------------------------------------------------ No results for  input(s): TSH, T4TOTAL, T3FREE, THYROIDAB in the last 72 hours.  Invalid input(s): FREET3 ------------------------------------------------------------------------------------------------------------------ No results for input(s): VITAMINB12, FOLATE, FERRITIN, TIBC, IRON, RETICCTPCT in the last 72 hours.  Coagulation profile Recent Labs  Lab 10/29/19 0410  INR 1.1    No results for input(s): DDIMER in the last 72 hours.  Cardiac Enzymes No results for input(s): CKMB, TROPONINI, MYOGLOBIN in the last 168 hours.  Invalid input(s): CK     Component Value Date/Time   BNP 583.7 (H) 11/01/2014 8875   Roxan Hockey M.D on 11/02/2019 at 10:38 AM  Go to [email or web address] - for contact info  [facility] - Office  [phone]

## 2019-11-02 NOTE — Care Management Important Message (Signed)
Important Message  Patient Details  Name: [name] MRN: [id number] Date of Birth: May 05, 1935   Medicare Important Message Given:  Yes     Corey Harold 11/02/2019, 11:35 AM

## 2019-11-02 NOTE — Progress Notes (Signed)
Stated name and that she was in the hospital. No other conversing.  Followed commands by weakly squeezing fingers.  Refused ensure by clamping lips together though multiple attempts.  Dressings changed to wounds.

## 2019-11-02 NOTE — Progress Notes (Signed)
Blood glucose was 51.  Gave amp of D50 and increased D5 1/2 NS from 60 mls/hr to 75/hr.  Rechecked and glucose was 120. Dr. Jayme Cloud aware.

## 2019-11-03 LAB — GLUCOSE, CAPILLARY
Glucose-Capillary: 101 mg/dL — ABNORMAL HIGH (ref 70–99)
Glucose-Capillary: 108 mg/dL — ABNORMAL HIGH (ref 70–99)
Glucose-Capillary: 88 mg/dL (ref 70–99)
Glucose-Capillary: 91 mg/dL (ref 70–99)
Glucose-Capillary: 94 mg/dL (ref 70–99)
Glucose-Capillary: 99 mg/dL (ref 70–99)

## 2019-11-03 NOTE — Progress Notes (Signed)
                                         Patient Demographics:    [name], is a 85 y.o. female, DOB - 04/09/1935, MRN:[id number]  Admit date - 10/23/2019   Admitting Physician Ejiroghene E , MD  Outpatient Primary MD for the patient is Shah, Ashish, MD  LOS - 18  Chief Complaint  Patient presents with  . Skin Ulcer       Subjective:   -- Husband at bedside, questions answered -Patient able to respond in 1-2 word responses -Still not eating --Not to new really  Assessment  & Plan :    Principal Problem:   Severe sepsis with septic shock - Klebsiella/Proteus/E. coli/Bacteroides and Staph  Active Problems:   S/P ICD (internal cardiac defibrillator) procedure, with upgrade to ICD/CRT-D  [employer]   Cardiomyopathy, ischemic EF 20 to 25 %/history of combined systolic and diastolic dysfunction CHF   Decubitus ulcer of right hip, stage 4 /Infected with E-coli and Proteus   Failure to thrive in adult   Decubitus ulcer of left hip, stage 3/Infected with E-coli and Proteus   Goals of care, counseling/discussion   Hematochezia   Sepsis secondary to Klebsiella UTI (HCC)   Anemia due to acute blood loss/rectal bleeding   Type II diabetes mellitus (HCC)   AKI (acute kidney injury) (HCC)   Thrombocytopenia Acquired   Hypertension   Mobitz type 2 second degree atrioventricular block   Polymorphic ventricular tachycardia (HCC)   Sepsis (HCC)   Stage I pressure ulcer of sacral region   Bacteremia due to Gram-negative bacteria   Gram negative sepsis (HCC)   Palliative care by specialist   Encounter for hospice care discussion   Brief Summary 85-year-old female with a history of advanced Alzheimer's dementia, CAD, H/o combined systolic and diastolic dysfunction CHF/HFrEF/ischemic cardiomyopathy with EF 20 to 25%, h/o torsades VT status post AICD, H/o HTN, DM2, HLD, admitted on 11/03/2019 from home with metabolic encephalopathy in the  setting of presumed infection (leukocytosis and Lactic acid was 3.1) with multiple decubitus ulcers -Weaned off IV Levophed on 10/22/19 -Requiring warming blanket from time to time ---Patient still unable to have oral intake due to advanced cognitive deficits--- she will spit food out, clenching her jaw and resist any attempt to feed her  -overall prognosis remains poor due to inability to take oral intake  Assessment/Plan: 1)Severe sepsis with septic shock-- POA -Etiology is multifactorial:-Patient has Klebsiella UTI (urine cx 10/17/19), E. coli and Proteus wound decubitus ulcer/wound infection (wound cx from 10/23/2019 and 10/17/19) and staph epi and Bacteroides bacteremia from blood culture dated 10/23/2019 -Weaned off IV Levophed on 10/22/19 --Stopped vancomycin after 10/21/2019 -MRSA PCR negative on 10/20/2019 -Repeat blood culture from 10/21/2019 NGTD -Patient remains encephalopathic, at baseline she does have advanced dementia  with significant cognitive and memory deficits -WBC is down to 8.7  from a peak of 25.6 -PCT was  2.91, ESR 60, CRP 14.1 - overall prognosis remains poor especially given lack of oral intake -Antimicrobials/Anti-fungal this admission: - Diflucan 3/16 and 3/17  -Ceftriaxone 10/22/2019 through 10/28/2019 inclusive  --metronidazole -10/18/2019 through 10/26/2019 inclusive  --zosyn x 1 on 10/26/2019 -- cefepime 10/24/2019 through 10/22/19 -Vanc --10/13/2019 through 10/22/19 -Eraxis -10/25/2019 through 10/28/2019 inclusive --Intermittent episodes of hypothermia requiring warming blanket from time to time--hypothermia resolved at this time  2)Bacteremia--B.fragilis and staph   epi--- ???  Significance of blood culture from 10/08/2019 -??  Contaminant, however patient was septic --Sepsis pathophysiology and Antibiotics as above #1 -Repeat blood cultures on 10/21/2019 NGTD - 3)Infected Bilateral Hip and Sacral decubitus ulcers--please see photos in epic -10/12/2019 CT abdomen/pelvis--right  lateral decubitus ulcer with subcutaneous inflammatory changes extending toward the greater trochanter. There is no bony destructive changes. There is considerable inflammatory change with air tracking toward the lateral aspect of the femur. -Since wounds are bilateral, is difficult to position patient so the pressure is offloaded from wounds -General surgery consultappreciated -10/17/19--bedside debridement -Wound cultures from 10/30/2019 and 10/17/2019 with E. coli and Proteus --Wound culture with fungus reluctant to use Diflucan due to prolonged QT concerns---okay to treat with anidulafungin -Sepsis pathophysiology and Antibiotics as above #1  4) Klebsiella UTI--urine culture from 10/17/2019 noted --Sepsis pathophysiology and Antibiotics as above #1  5)Failure to thrive -Serum B12--199-->supplement -TSH--37.364, Free T4--0.28 -Folate--7.2 -PT evaluation-->SNF -continues to have poor po intake -Cognitive status and encephalopathy makes it difficult to feed orally ---Patient still unable to have oral intake due to advanced cognitive deficits--- she will spit food out, clenching her jaw and resist any attempt to feed her -Continue judicious IV dextrose fluids given that patient's EF is down to 20 to 25% -Borderline blood glucose despite IV dextrose infusion --Remains very challenging to feed patient as she closes her mouth , clenching her jaw and spits out anything -husband has decided against NG tube/Keofeed Tube and against  PEG/G-tube placement  -  6)Symptomatic acute on chronic blood loss anemia due to Rectal bleeding-hemoglobin is up  to 8.1 from 6.9 after transfusion of 2 units of PRBC on 10/21/2019 hypotension resolved -No further rectal bleeding -Protonix for GI prophylaxis GI consult appreciated suspect rectal injury from fecal impaction--suppositories as ordered -Low platelets noted (19K) ,  no bleeding noted Suspect thrombocytopenia and anemia of critical illness / -  infection and malnutrition related. -Consider transfusion if family desires if platelet count below 10K or if bleeding occurs  7)CKD stage 3b -initially felt to be AKI but now more likely progression of CKD -pt likely has progression of underlying CKD -Creatinine hovering below 2 -No recent renal labs available to compare current values to -renal US-->medical renal disease -Creatinine is down to 1.3 from a peak of 1.83 - renally adjust medications, avoid nephrotoxic agents / dehydration  / hypotension -Very scant urine output in Foley bag -Foley was placed due to urinary retention  8)H/o combined systolic and diastolic dysfunction CHF/HFrEF/ischemic cardiomyopathy with EF 20 to 25%- -02/2015 echo EF 20-25%, G2DD -BP improved with iVF   - c/n Judicious IV fluids  9)Hypernatremia/dehydration -Resolved with IV fluids, -due to continued poor po intake and hypoglycemia--will c/n D5 half-normal as ordered  10)Alzheimer's dementia/ambulatory dysfunction -Continue Namenda -spouse states pt screams frequently at home -At baseline patient has very advanced dementia with severe cognitive and memory deficits, she has been bed-bound for the last 3 months PTA, requiring total care  11)Coronary artery disease -hx of CABG in 2009 (LIMA-LAD, SVG-ramus, SVG-OM). Hx of RCA stent 04/2012 -no chest pain presently Discontinued Aspirin and Plavix due to rectal bleeding with symptomatic Anemia -Continue statin -Coreg on hold due to hypotension  12)Social/Ethics--- overall prognosis is poor, patient is a DNR/DNI -Plan of care, goals of care discussed with patient's husband at bedside -Official palliative care consult appreciated -Family declined comfort care and hospice at this time -Very advanced dementia with significant cognitive and memory deficits--- patient is total care     13)Thrombocytopenia---  Platelets 206 >> 188>>> 117>> 74>> 48>>> 38>>32>>27>>24>>19.  Possibly related to underlying  infectious process.  No signs of bleeding at this time.   --Consider transfusion if family desires if platelet count below 10K or if bleeding occurs  14)Ventricular Tachycardia/H/o Torsades -status post AICD placement - she has CRT-D device followed by EP - she underwent ICD upgrade of single chamber ICD to [employer] CRT-D  Disposition Plan: Patient From: Home D/C Place: [facility] Barriers: Not Clinically Stable--poor po intake, severe bacterial infection requiring IV fluids  --Family declined comfort care and hospice at this time -Will also need continued conversations with palliative care around prognosis  Code Status : DNR  Procedures-- bedside decubitus ulcer debridement on 10/17/2019 -Right arm PICC line placement on 10/21/2019 -transfusion of 2 units of PRBC on 10/21/2019 -Bedside decubitus debridement by Dr. Constance Haw on 10/22/2019  Family Communication:    Discussed with husband  Consults  : Palliative care and general surgery  DVT Prophylaxis  :  Teds and SCDs/rectal bleeding/low platelets  Lab Results  Component Value Date   PLT 19 (LL) 11/02/2019   Inpatient Medications  Scheduled Meds: . Chlorhexidine Gluconate Cloth  6 each Topical Daily  . collagenase   Topical BID  . feeding supplement (ENSURE ENLIVE)  237 mL Oral TID  . hydrocortisone  25 mg Rectal BID  . levothyroxine  37.5 mcg Intravenous Q0600  . mouth rinse  15 mL Mouth Rinse BID  . pantoprazole (PROTONIX) IV  40 mg Intravenous Q24H  . polyethylene glycol  17 g Oral Daily  . sodium chloride flush  10-40 mL Intracatheter Q12H   Continuous Infusions: . sodium chloride 10 mL/hr at 10/21/19 2018  . dextrose 5 % and 0.45% NaCl 75 mL/hr at 11/03/19 0509   PRN Meds:.sodium chloride, acetaminophen **OR** acetaminophen, bisacodyl, fentaNYL (SUBLIMAZE) injection, LORazepam, ondansetron (ZOFRAN) IV, sodium chloride flush   Anti-infectives (From admission, onward)   Start      Dose/Rate Route Frequency Ordered Stop   10/25/19 1500  anidulafungin (ERAXIS) 100 mg in sodium chloride 0.9 % 100 mL IVPB     100 mg 78 mL/hr over 100 Minutes Intravenous Every 24 hours 10/24/19 1728 10/28/19 1803   10/24/19 1400  fluconazole (DIFLUCAN) IVPB 200 mg  Status:  Discontinued     200 mg 100 mL/hr over 60 Minutes Intravenous Every 24 hours 10/23/19 1144 10/24/19 1717   10/23/19 1400  fluconazole (DIFLUCAN) IVPB 400 mg     400 mg 100 mL/hr over 120 Minutes Intravenous  Once 10/23/19 1144 10/23/19 2302   10/22/19 2200  cefTRIAXone (ROCEPHIN) 2 g in sodium chloride 0.9 % 100 mL IVPB     2 g 200 mL/hr over 30 Minutes Intravenous Every 24 hours 10/22/19 1022 10/28/19 2259   10/17/19 2359  vancomycin (VANCOREADY) IVPB 1250 mg/250 mL     1,250 mg 166.7 mL/hr over 90 Minutes Intravenous Every 48 hours 10/17/2019 2022 10/22/19 2222   10/08/2019 2359  metroNIDAZOLE (FLAGYL) IVPB 500 mg  Status:  Discontinued     500 mg 100 mL/hr over 60 Minutes Intravenous Every 8 hours 11/04/2019 2047 10/26/19 1604   10/29/2019 2359  ceFEPIme (MAXIPIME) 2 g in sodium chloride 0.9 % 100 mL IVPB  Status:  Discontinued     2 g 200 mL/hr over 30 Minutes Intravenous Every 24 hours 10/18/2019 2012 10/22/19 1022   10/28/2019 1430  vancomycin (VANCOCIN) IVPB 1000 mg/200 mL premix     1,000  mg 200 mL/hr over 60 Minutes Intravenous  Once 10/20/2019 1428 10/26/2019 1713   11/01/2019 1430  piperacillin-tazobactam (ZOSYN) IVPB 3.375 g     3.375 g 100 mL/hr over 30 Minutes Intravenous  Once 10/28/2019 1428 10/18/2019 1607       Objective:   Vitals:   11/02/19 1311 11/02/19 2208 11/03/19 0602 11/03/19 1500  BP: (!) 108/56 127/64 103/72 100/70  Pulse: 88 89 91 88  Resp: _0 Temp: 97.6 F (36.4 C) (!) 97.5 F (36.4 C) (!) 97.5 F (36.4 C) 98.6 F (37 C)  TempSrc: Oral Oral Oral Oral  SpO2: 100% 100% 100% 100%  Weight:      Height:        Wt Readings from Last 3 Encounters:  10/22/19 80.2 kg  06/13/19 78.5 kg   10/19/18 73.6 kg    Intake/Output Summary (Last 24 hours) at 11/03/2019 1757 Last data filed at 11/03/2019 1500 Gross per 24 hour  Intake 2627.05 ml  Output 1400 ml  Net 1227.05 ml   Physical Exam  General exam: Lethargic, nonverbal (seldom says her name) Respiratory system: Fair air movement, no wheezing Cardiovascular system:RRR.  Prior sternotomy scar Gastrointestinal system: Abdomen is nondistended, soft and nontender, Normal bowel sounds heard. Central nervous system:-Bedbound, no purposeful movements   Extremities: No C/C/E, +pedal pulses Skin: --Multiple skin decubitus please see photos in epic Psychiatry: Very advanced dementia with significant cognitive and memory deficits--- patient is total care GU-Foley with scant urine MSK-right arm PICC line     Data Review:   Micro Results No results found for this or any previous visit (from the past 240 hour(s)).  Radiology Reports CT ABDOMEN PELVIS WO CONTRAST  Result Date: 10/27/2019 CLINICAL DATA:  Decubitus ulcers EXAM: CT ABDOMEN AND PELVIS WITHOUT CONTRAST TECHNIQUE: Multidetector CT imaging of the abdomen and pelvis was performed following the standard protocol without IV contrast. COMPARISON:  None. FINDINGS: Lower chest: No acute abnormality. Hepatobiliary: Scattered calcifications are noted within the liver. Gallbladder is well distended with multiple dependent gallstones. Pancreas: Unremarkable. No pancreatic ductal dilatation or surrounding inflammatory changes. Spleen: Normal in size without focal abnormality. Adrenals/Urinary Tract: Adrenal glands are within normal limits. Kidneys are well visualized bilaterally with renal vascular calcifications. No renal stones are seen. No obstructive changes are noted. The bladder is partially distended. Stomach/Bowel: The appendix is not well visualized although no inflammatory changes to suggest appendicitis are noted. Retained fecal material is noted throughout the colon consistent  with a mild degree of constipation without obstructive change. No small bowel abnormality is seen. Stomach is unremarkable. Vascular/Lymphatic: Aortic atherosclerosis. No enlarged abdominal or pelvic lymph nodes. Reproductive: Uterus and bilateral adnexa are unremarkable. Other: No abdominal wall hernia or abnormality. No abdominopelvic ascites. Musculoskeletal: Mild changes of anasarca are noted. Decubitus ulcer is noted laterally near the right hip with inflammatory changes extending from the skin towards the greater trochanter. This area measures approximately 7.6 x 4.3 cm. No definitive bony erosive changes to suggest osteomyelitis are noted at this time. No other definitive decubitus ulcer is seen. Degenerative changes of lumbar spine are seen. IMPRESSION: Right lateral decubitus ulcer with subcutaneous inflammatory changes extending towards the greater trochanter of the right proximal femur. No definitive bony destructive changes are seen. Considerable subcutaneous inflammatory changes noted with air tracking towards the lateral aspect of the femur. Changes of mild constipation. Cholelithiasis without complicating factors. Electronically Signed   By: Inez Catalina M.D.   On: 11/07/2019 16:51   US  RENAL  Result Date: 10/18/2019 CLINICAL DATA:  Acute renal insufficiency. EXAM: RENAL / URINARY TRACT ULTRASOUND COMPLETE COMPARISON:  10/15/2019 CT FINDINGS: Right Kidney: Renal measurements: 9.3 x 4.4 x 5.8 cm = volume: 123 mL. Increased renal echogenicity. No hydronephrosis. Left Kidney: Renal measurements: 9.6 x 5.2 x 4.6 cm = volume: 119 mL. Increased renal echogenicity. No hydronephrosis. Bladder: Decompressed. Other: Trace perinephric edema/fluid likely relates to renal insufficiency. Note is made of gallbladder sludge and stones including at 1.0 cm. IMPRESSION: 1. No hydronephrosis. Increased renal echogenicity, consistent with medical renal disease. 2. Cholelithiasis without acute cholecystitis.  Electronically Signed   By: Abigail Miyamoto M.D.   On: 10/18/2019 11:51   DG Chest Port 1 View  Result Date: 11/07/2019 CLINICAL DATA:  Sepsis EXAM: PORTABLE CHEST 1 VIEW COMPARISON:  11/05/2014 FINDINGS: Stable positioning of a left-sided implanted cardiac device. Post CABG changes. Stable cardiomediastinal contours. No focal airspace consolidation, pleural effusion, or pneumothorax. IMPRESSION: No acute cardiopulmonary findings. Electronically Signed   By: Davina Poke D.O.   On: 10/08/2019 14:47   [place] EKG SITE RITE  Result Date: 10/20/2019 If Site Rite image not attached, placement could not be confirmed due to current cardiac rhythm.   CBC Recent Labs  Lab 10/28/19 0401 10/29/19 0410 10/31/19 0449 11/02/19 0505  WBC 9.0 8.7 5.6 5.5  HGB 7.4* 9.2* 7.7* 8.1*  HCT 24.5* 30.1* 25.2* 25.6*  PLT 27* 24* 18* 19*  MCV 95.3 95.0 94.0 94.5  MCH 28.8 29.0 28.7 29.9  MCHC 30.2 30.6 30.6 31.6  RDW 17.4* 17.5* 17.6* 17.9*    Chemistries  Recent Labs  Lab 10/28/19 0401 10/29/19 0410 10/31/19 0449 11/02/19 0505  NA 138 141 142 142  K 3.3* 3.3* 3.0* 3.4*  CL 114* 117* 118* 117*  CO2 15* 16* 17* 17*  GLUCOSE 288* 83 81 98  BUN 43* 42* 36* 31*  CREATININE 1.44* 1.42* 1.38* 1.30*  CALCIUM 7.4* 8.0* 7.9* 7.8*   ------------------------------------------------------------------------------------------------------------------ No results for input(s): CHOL, HDL, LDLCALC, TRIG, CHOLHDL, LDLDIRECT in the last 72 hours.  Lab Results  Component Value Date   HGBA1C 5.5 10/19/2019   ------------------------------------------------------------------------------------------------------------------ No results for input(s): TSH, T4TOTAL, T3FREE, THYROIDAB in the last 72 hours.  Invalid input(s): FREET3 ------------------------------------------------------------------------------------------------------------------ No results for input(s): VITAMINB12, FOLATE, FERRITIN, TIBC, IRON, RETICCTPCT  in the last 72 hours.  Coagulation profile Recent Labs  Lab 10/29/19 0410  INR 1.1    No results for input(s): DDIMER in the last 72 hours.  Cardiac Enzymes No results for input(s): CKMB, TROPONINI, MYOGLOBIN in the last 168 hours.  Invalid input(s): CK     Component Value Date/Time   BNP 583.7 (H) 11/01/2014 0539   Roxan Hockey M.D on 11/03/2019 at 5:57 PM  Go to [email or web address] - for contact info  [facility] - Office  [phone]

## 2019-11-04 LAB — CBC
HCT: 25.7 % — ABNORMAL LOW (ref 36.0–46.0)
Hemoglobin: 7.8 g/dL — ABNORMAL LOW (ref 12.0–15.0)
MCH: 28.7 pg (ref 26.0–34.0)
MCHC: 30.4 g/dL (ref 30.0–36.0)
MCV: 94.5 fL (ref 80.0–100.0)
Platelets: 28 10*3/uL — CL (ref 150–400)
RBC: 2.72 MIL/uL — ABNORMAL LOW (ref 3.87–5.11)
RDW: 17.8 % — ABNORMAL HIGH (ref 11.5–15.5)
WBC: 5.4 10*3/uL (ref 4.0–10.5)
nRBC: 0 % (ref 0.0–0.2)

## 2019-11-04 LAB — GLUCOSE, CAPILLARY
Glucose-Capillary: 80 mg/dL (ref 70–99)
Glucose-Capillary: 84 mg/dL (ref 70–99)
Glucose-Capillary: 85 mg/dL (ref 70–99)
Glucose-Capillary: 87 mg/dL (ref 70–99)
Glucose-Capillary: 96 mg/dL (ref 70–99)
Glucose-Capillary: 96 mg/dL (ref 70–99)

## 2019-11-04 LAB — BASIC METABOLIC PANEL
Anion gap: 6 (ref 5–15)
BUN: 27 mg/dL — ABNORMAL HIGH (ref 8–23)
CO2: 18 mmol/L — ABNORMAL LOW (ref 22–32)
Calcium: 7.7 mg/dL — ABNORMAL LOW (ref 8.9–10.3)
Chloride: 117 mmol/L — ABNORMAL HIGH (ref 98–111)
Creatinine, Ser: 1.23 mg/dL — ABNORMAL HIGH (ref 0.44–1.00)
GFR calc Af Amer: 47 mL/min — ABNORMAL LOW (ref >60)
GFR calc non Af Amer: 41 mL/min — ABNORMAL LOW (ref >60)
Glucose, Bld: 96 mg/dL (ref 70–99)
Potassium: 3.9 mmol/L (ref 3.5–5.1)
Sodium: 141 mmol/L (ref 135–145)

## 2019-11-04 NOTE — Progress Notes (Signed)
Patient Demographics:    [name], is a 85 y.o. female, DOB - [birth year], QKM:[id number]  Admit date - 10/15/2019   Admitting Physician Ejiroghene Arlyce Dice, MD  Outpatient Primary MD for the patient is Kelly Blitz, MD  LOS - 20  Chief Complaint  Patient presents with  . Skin Ulcer       Subjective:   -- --Opens eyes to verbal commands today -Not really verbal today -No family at bedside  Assessment  & Plan :    Principal Problem:   Severe sepsis with septic shock - Klebsiella/Proteus/E. coli/Bacteroides and Staph  Active Problems:   S/P ICD (internal cardiac defibrillator) procedure, with upgrade to ICD/CRT-D  [employer]   Cardiomyopathy, ischemic EF 20 to 25 %/history of combined systolic and diastolic dysfunction CHF   Decubitus ulcer of right hip, stage 4 /Infected with E-coli and Proteus   Failure to thrive in adult   Decubitus ulcer of left hip, stage 3/Infected with E-coli and Proteus   Goals of care, counseling/discussion   Hematochezia   Sepsis secondary to Klebsiella UTI ([place])   Anemia due to acute blood loss/rectal bleeding   Type II diabetes mellitus (HCC)   AKI (acute kidney injury) (HCC)   Thrombocytopenia Acquired   Hypertension   Mobitz type 2 second degree atrioventricular block   Polymorphic ventricular tachycardia (HCC)   Sepsis (HCC)   Stage I pressure ulcer of sacral region   Bacteremia due to Gram-negative bacteria   Gram negative sepsis (HCC)   Palliative care by specialist   Encounter for hospice care discussion   Brief Summary 85 year old female with a history of advanced Alzheimer's dementia, CAD, H/o combined systolic and diastolic dysfunction CHF/HFrEF/ischemic cardiomyopathy with EF 20 to 25%, h/o torsades VT status post AICD, H/o HTN, DM2, HLD, admitted on 10/30/2019 from home with metabolic encephalopathy in the setting of presumed infection (leukocytosis  and Lactic acid was 3.1) with multiple decubitus ulcers -Weaned off IV Levophed on 10/22/19 -Requiring warming blanket from time to time ---Patient still unable to have oral intake due to advanced cognitive deficits--- she will spit food out, clenching her jaw and resist any attempt to feed her  -overall prognosis remains poor due to inability to take oral intake  Assessment/Plan: 1)Severe sepsis with septic shock-- POA -Etiology is multifactorial:-Patient has Klebsiella UTI (urine cx 10/17/19), E. coli and Proteus wound decubitus ulcer/wound infection (wound cx from 10/31/2019 and 10/17/19) and staph epi and Bacteroides bacteremia from blood culture dated 10/25/2019 -Weaned off IV Levophed on 10/22/19 --Stopped vancomycin after 10/21/2019 -MRSA PCR negative on 10/20/2019 -Repeat blood culture from 10/21/2019 NGTD -Patient remains encephalopathic, at baseline she does have advanced dementia  with significant cognitive and memory deficits -WBC is down to 5.4  from a peak of 25.6 -PCT was  2.91, ESR 60, CRP 14.1 - overall prognosis remains poor especially given lack of oral intake -Antimicrobials/Anti-fungal this admission: - Diflucan 3/16 and 3/17  -Ceftriaxone 10/22/2019 through 10/28/2019 inclusive  --metronidazole -10/24/2019 through 10/26/2019 inclusive  --zosyn x 1 on 10/08/2019 -- cefepime 10/09/2019 through 10/22/19 -Vanc --11/05/2019 through 10/22/19 -Eraxis -10/25/2019 through 10/28/2019 inclusive --Intermittent episodes of hypothermia requiring warming blanket from time to time--hypothermia resolved at this time  2)Bacteremia--B.fragilis and staph epi--- ???  Significance of blood  culture from 10/29/2019 -??  Contaminant, however patient was septic --Sepsis pathophysiology and Antibiotics as above #1 -Repeat blood cultures on 10/21/2019 NGTD - 3)Infected Bilateral Hip and Sacral decubitus ulcers--please see photos in epic -10/08/2019 CT abdomen/pelvis--right lateral decubitus ulcer with subcutaneous  inflammatory changes extending toward the greater trochanter. There is no bony destructive changes. There is considerable inflammatory change with air tracking toward the lateral aspect of the femur. -Since wounds are bilateral, is difficult to position patient so the pressure is offloaded from wounds -General surgery consultappreciated -10/17/19--bedside debridement -Wound cultures from 10/14/2019 and 10/17/2019 with E. coli and Proteus --Wound culture with fungus reluctant to use Diflucan due to prolonged QT concerns---okay to treat with anidulafungin -Sepsis pathophysiology and Antibiotics as above #1  4) Klebsiella UTI--urine culture from 10/17/2019 noted --Sepsis pathophysiology and Antibiotics as above #1  5)Failure to thrive -Serum B12--199-->supplement -TSH--37.364, Free T4--0.28 -Folate--7.2 -PT evaluation-->SNF -continues to have poor po intake -Cognitive status and encephalopathy makes it difficult to feed orally ---Patient still unable to have oral intake due to advanced cognitive deficits--- she will spit food out, clenching her jaw and resist any attempt to feed her -Continue judicious IV dextrose fluids given that patient's EF is down to 20 to 25% -Borderline blood glucose despite IV dextrose infusion --Remains very challenging to feed patient as she closes her mouth , clenching her jaw and spits out anything -husband has decided against NG tube/Keofeed Tube and against  PEG/G-tube placement  -  6)Symptomatic acute on chronic blood loss anemia due to Rectal bleeding-hemoglobin is up  to 7.8 from 6.9 after transfusion of 2 units of PRBC on 10/21/2019 hypotension resolved -No further rectal bleeding -Protonix for GI prophylaxis GI consult appreciated suspect rectal injury from fecal impaction--suppositories as ordered -Low platelets noted (19K) ,  no bleeding noted Suspect thrombocytopenia and anemia of critical illness / - infection and malnutrition related. -Consider  transfusion if family desires if platelet count below 10K or if bleeding occurs  7)CKD stage 3b -initially felt to be AKI but now more likely progression of CKD -pt likely has progression of underlying CKD -Creatinine hovering below 2 -No recent renal labs available to compare current values to -renal US-->medical renal disease -Creatinine is down to 1.3 from a peak of 1.83 - renally adjust medications, avoid nephrotoxic agents / dehydration  / hypotension -Very scant urine output in Foley bag -Foley was placed due to urinary retention  8)H/o combined systolic and diastolic dysfunction CHF/HFrEF/ischemic cardiomyopathy with EF 20 to 25%- -02/2015 echo EF 20-25%, G2DD -BP improved with iVF   - c/n Judicious IV fluids  9)Hypernatremia/dehydration -Resolved with IV fluids, -due to continued poor po intake and hypoglycemia--will c/n D5 half-normal as ordered  10)Alzheimer's dementia/ambulatory dysfunction -Continue Namenda -spouse states pt screams frequently at home -At baseline patient has very advanced dementia with severe cognitive and memory deficits, she has been bed-bound for the last 3 months PTA, requiring total care  11)Coronary artery disease -hx of CABG in 2009 (LIMA-LAD, SVG-ramus, SVG-OM). Hx of RCA stent 04/2012 -no chest pain presently Discontinued Aspirin and Plavix due to rectal bleeding with symptomatic Anemia -Continue statin -Coreg on hold due to hypotension  12)Social/Ethics--- overall prognosis is poor, patient is a DNR/DNI -Plan of care, goals of care discussed with patient's husband at bedside -Official palliative care consult appreciated -Family declined comfort care and hospice at this time -Very advanced dementia with significant cognitive and memory deficits--- patient is total care   13)Thrombocytopenia---  Platelets 206 >>  188>>> 117>> 74>> 48>>> 38>>32>>27>>24>>18>>19>>28.  -- Possibly related to underlying infectious process.  No signs of  bleeding at this time.   --Consider transfusion (if family desires) if platelet count below 10K or if bleeding occurs  14)Ventricular Tachycardia/H/o Torsades -status post AICD placement - she has CRT-D device followed by EP - she underwent ICD upgrade of single chamber ICD to [employer] CRT-D  Disposition Plan: Patient From: Home D/C Place: Hospice at Home [facility] Barriers: Not Clinically Stable--poor po intake, -Borderline blood glucose despite IV dextrose infusion --Remains very challenging to feed patient as she closes her mouth , clenching her jaw and spits out anything -She is not eating-----Platelets are too low to do PEG Procedure even of family wanted PEG (family previously declined PEG)--- --Family declined comfort care and hospice at this time -Will also need continued conversations with palliative care around prognosis  Code Status : DNR  Procedures-- bedside decubitus ulcer debridement on 10/17/2019 -Right arm PICC line placement on 10/21/2019 -transfusion of 2 units of PRBC on 10/21/2019 -Bedside decubitus debridement by Dr. Constance Haw on 10/22/2019  Family Communication:    Discussed with husband  Consults  : Palliative care and general surgery  DVT Prophylaxis  :  Teds and SCDs/rectal bleeding/low platelets  Lab Results  Component Value Date   PLT 28 (LL) 11/04/2019   Inpatient Medications  Scheduled Meds: . Chlorhexidine Gluconate Cloth  6 each Topical Daily  . collagenase   Topical BID  . feeding supplement (ENSURE ENLIVE)  237 mL Oral TID  . hydrocortisone  25 mg Rectal BID  . levothyroxine  37.5 mcg Intravenous Q0600  . mouth rinse  15 mL Mouth Rinse BID  . pantoprazole (PROTONIX) IV  40 mg Intravenous Q24H  . polyethylene glycol  17 g Oral Daily  . sodium chloride flush  10-40 mL Intracatheter Q12H   Continuous Infusions: . sodium chloride 10 mL/hr at 10/21/19 2018  . dextrose 5 % and 0.45% NaCl 75 mL/hr at 11/04/19 1011   PRN  Meds:.sodium chloride, acetaminophen **OR** acetaminophen, bisacodyl, fentaNYL (SUBLIMAZE) injection, LORazepam, ondansetron (ZOFRAN) IV, sodium chloride flush   Anti-infectives (From admission, onward)   Start     Dose/Rate Route Frequency Ordered Stop   10/25/19 1500  anidulafungin (ERAXIS) 100 mg in sodium chloride 0.9 % 100 mL IVPB     100 mg 78 mL/hr over 100 Minutes Intravenous Every 24 hours 10/24/19 1728 10/28/19 1803   10/24/19 1400  fluconazole (DIFLUCAN) IVPB 200 mg  Status:  Discontinued     200 mg 100 mL/hr over 60 Minutes Intravenous Every 24 hours 10/23/19 1144 10/24/19 1717   10/23/19 1400  fluconazole (DIFLUCAN) IVPB 400 mg     400 mg 100 mL/hr over 120 Minutes Intravenous  Once 10/23/19 1144 10/23/19 2302   10/22/19 2200  cefTRIAXone (ROCEPHIN) 2 g in sodium chloride 0.9 % 100 mL IVPB     2 g 200 mL/hr over 30 Minutes Intravenous Every 24 hours 10/22/19 1022 10/28/19 2259   10/17/19 2359  vancomycin (VANCOREADY) IVPB 1250 mg/250 mL     1,250 mg 166.7 mL/hr over 90 Minutes Intravenous Every 48 hours 10/20/2019 2022 10/22/19 2222   11/06/2019 2359  metroNIDAZOLE (FLAGYL) IVPB 500 mg  Status:  Discontinued     500 mg 100 mL/hr over 60 Minutes Intravenous Every 8 hours 10/10/2019 2047 10/26/19 1604   10/19/2019 2359  ceFEPIme (MAXIPIME) 2 g in sodium chloride 0.9 % 100 mL IVPB  Status:  Discontinued  2 g 200 mL/hr over 30 Minutes Intravenous Every 24 hours 11/07/2019 2012 10/22/19 1022   10/27/2019 1430  vancomycin (VANCOCIN) IVPB 1000 mg/200 mL premix     1,000 mg 200 mL/hr over 60 Minutes Intravenous  Once 11/02/2019 1428 10/21/2019 1713   10/31/2019 1430  piperacillin-tazobactam (ZOSYN) IVPB 3.375 g     3.375 g 100 mL/hr over 30 Minutes Intravenous  Once 10/10/2019 1428 11/04/2019 1607       Objective:   Vitals:   11/03/19 0602 11/03/19 1500 11/03/19 2057 11/04/19 0501  BP: 103/72 100/70 132/75 (!) 119/57  Pulse: 91 88 95 88  Resp: '18 18 18 16  ' Temp: (!) 97.5 F (36.4 C) 98.6  F (37 C) 97.7 F (36.5 C) (!) 97.5 F (36.4 C)  TempSrc: Oral Oral Oral Axillary  SpO2: 100% 100% 100% 100%  Weight:      Height:        Wt Readings from Last 3 Encounters:  10/22/19 80.2 kg  06/13/19 78.5 kg  10/19/18 73.6 kg    Intake/Output Summary (Last 24 hours) at 11/04/2019 1059 Last data filed at 11/03/2019 1500 Gross per 24 hour  Intake 731.05 ml  Output -  Net 731.05 ml   Physical Exam  General exam: -Wakes up to verbal and tactile stimuli respiratory system: Fair air movement, no wheezing Cardiovascular system:RRR.  Prior sternotomy scar Gastrointestinal system: Abdomen is nondistended, soft and nontender, Normal bowel sounds heard. Central nervous system:-Bedbound, no purposeful movements   Extremities: No C/C/E, +pedal pulses Skin: --Multiple skin decubitus please see photos in epic Psychiatry: Very advanced dementia with significant cognitive and memory deficits--- patient is total care GU-Foley with scant urine MSK-right arm PICC line     Data Review:   Micro Results No results found for this or any previous visit (from the past 240 hour(s)).  Radiology Reports CT ABDOMEN PELVIS WO CONTRAST  Result Date: 10/14/2019 CLINICAL DATA:  Decubitus ulcers EXAM: CT ABDOMEN AND PELVIS WITHOUT CONTRAST TECHNIQUE: Multidetector CT imaging of the abdomen and pelvis was performed following the standard protocol without IV contrast. COMPARISON:  None. FINDINGS: Lower chest: No acute abnormality. Hepatobiliary: Scattered calcifications are noted within the liver. Gallbladder is well distended with multiple dependent gallstones. Pancreas: Unremarkable. No pancreatic ductal dilatation or surrounding inflammatory changes. Spleen: Normal in size without focal abnormality. Adrenals/Urinary Tract: Adrenal glands are within normal limits. Kidneys are well visualized bilaterally with renal vascular calcifications. No renal stones are seen. No obstructive changes are noted. The bladder  is partially distended. Stomach/Bowel: The appendix is not well visualized although no inflammatory changes to suggest appendicitis are noted. Retained fecal material is noted throughout the colon consistent with a mild degree of constipation without obstructive change. No small bowel abnormality is seen. Stomach is unremarkable. Vascular/Lymphatic: Aortic atherosclerosis. No enlarged abdominal or pelvic lymph nodes. Reproductive: Uterus and bilateral adnexa are unremarkable. Other: No abdominal wall hernia or abnormality. No abdominopelvic ascites. Musculoskeletal: Mild changes of anasarca are noted. Decubitus ulcer is noted laterally near the right hip with inflammatory changes extending from the skin towards the greater trochanter. This area measures approximately 7.6 x 4.3 cm. No definitive bony erosive changes to suggest osteomyelitis are noted at this time. No other definitive decubitus ulcer is seen. Degenerative changes of lumbar spine are seen. IMPRESSION: Right lateral decubitus ulcer with subcutaneous inflammatory changes extending towards the greater trochanter of the right proximal femur. No definitive bony destructive changes are seen. Considerable subcutaneous inflammatory changes noted with air tracking  towards the lateral aspect of the femur. Changes of mild constipation. Cholelithiasis without complicating factors. Electronically Signed   By: Inez Catalina M.D.   On: 10/22/2019 16:51   US RENAL  Result Date: 10/18/2019 CLINICAL DATA:  Acute renal insufficiency. EXAM: RENAL / URINARY TRACT ULTRASOUND COMPLETE COMPARISON:  10/20/2019 CT FINDINGS: Right Kidney: Renal measurements: 9.3 x 4.4 x 5.8 cm = volume: 123 mL. Increased renal echogenicity. No hydronephrosis. Left Kidney: Renal measurements: 9.6 x 5.2 x 4.6 cm = volume: 119 mL. Increased renal echogenicity. No hydronephrosis. Bladder: Decompressed. Other: Trace perinephric edema/fluid likely relates to renal insufficiency. Note is made of  gallbladder sludge and stones including at 1.0 cm. IMPRESSION: 1. No hydronephrosis. Increased renal echogenicity, consistent with medical renal disease. 2. Cholelithiasis without acute cholecystitis. Electronically Signed   By: Abigail Miyamoto M.D.   On: 10/18/2019 11:51   DG Chest Port 1 View  Result Date: 10/19/2019 CLINICAL DATA:  Sepsis EXAM: PORTABLE CHEST 1 VIEW COMPARISON:  11/05/2014 FINDINGS: Stable positioning of a left-sided implanted cardiac device. Post CABG changes. Stable cardiomediastinal contours. No focal airspace consolidation, pleural effusion, or pneumothorax. IMPRESSION: No acute cardiopulmonary findings. Electronically Signed   By: Davina Poke D.O.   On: 10/28/2019 14:47   [place] EKG SITE RITE  Result Date: 10/20/2019 If Site Rite image not attached, placement could not be confirmed due to current cardiac rhythm.   CBC Recent Labs  Lab 10/29/19 0410 10/31/19 0449 11/02/19 0505 11/04/19 0518  WBC 8.7 5.6 5.5 5.4  HGB 9.2* 7.7* 8.1* 7.8*  HCT 30.1* 25.2* 25.6* 25.7*  PLT 24* 18* 19* 28*  MCV 95.0 94.0 94.5 94.5  MCH 29.0 28.7 29.9 28.7  MCHC 30.6 30.6 31.6 30.4  RDW 17.5* 17.6* 17.9* 17.8*    Chemistries  Recent Labs  Lab 10/29/19 0410 10/31/19 0449 11/02/19 0505 11/04/19 0518  NA 141 142 142 141  K 3.3* 3.0* 3.4* 3.9  CL 117* 118* 117* 117*  CO2 16* 17* 17* 18*  GLUCOSE 83 81 98 96  BUN 42* 36* 31* 27*  CREATININE 1.42* 1.38* 1.30* 1.23*  CALCIUM 8.0* 7.9* 7.8* 7.7*   ------------------------------------------------------------------------------------------------------------------ No results for input(s): CHOL, HDL, LDLCALC, TRIG, CHOLHDL, LDLDIRECT in the last 72 hours.  Lab Results  Component Value Date   HGBA1C 5.5 10/19/2019   ------------------------------------------------------------------------------------------------------------------ No results for input(s): TSH, T4TOTAL, T3FREE, THYROIDAB in the last 72 hours.  Invalid input(s):  FREET3 ------------------------------------------------------------------------------------------------------------------ No results for input(s): VITAMINB12, FOLATE, FERRITIN, TIBC, IRON, RETICCTPCT in the last 72 hours.  Coagulation profile Recent Labs  Lab 10/29/19 0410  INR 1.1    No results for input(s): DDIMER in the last 72 hours.  Cardiac Enzymes No results for input(s): CKMB, TROPONINI, MYOGLOBIN in the last 168 hours.  Invalid input(s): CK     Component Value Date/Time   BNP 583.7 (H) 11/01/2014 1017   Roxan Hockey M.D on 11/04/2019 at 10:59 AM  Go to [email or web address] - for contact info  [facility] - Office  [phone]

## 2019-11-05 LAB — GLUCOSE, CAPILLARY
Glucose-Capillary: 105 mg/dL — ABNORMAL HIGH (ref 70–99)
Glucose-Capillary: 107 mg/dL — ABNORMAL HIGH (ref 70–99)
Glucose-Capillary: 113 mg/dL — ABNORMAL HIGH (ref 70–99)
Glucose-Capillary: 89 mg/dL (ref 70–99)
Glucose-Capillary: 91 mg/dL (ref 70–99)
Glucose-Capillary: 98 mg/dL (ref 70–99)

## 2019-11-05 NOTE — Progress Notes (Addendum)
Palliative:  I attempted to come by and visit with [name] but no family at bedside x 2 attempts. I did discuss with Dr. Mariea Clonts and her status is unchanged. She has neither declined or improved. I would recommend if family desires measures to prolong life (including prolonged artificial hydration) then feeding tube would be indicated. She continues to be unable to meet nutritional/hydrational needs to thrive and this is not expected to improve/change. We are providing measures that is prolonging life but not improving condition at this stage. Platelets would have to increase in order to place PEG. If they do not desire measures to prolong life then would recommend consideration of comfort measures.   No charge  Yong Channel, NP Palliative Medicine Team Pager [phone] (Please see [email or web address] for schedule) Team Phone [phone]

## 2019-11-05 NOTE — Progress Notes (Addendum)
Patient Demographics:    [name], is a 85 y.o. female, DOB - 08-21-1934, QBH:[id number]  Admit date - 11/04/2019   Admitting Physician Ejiroghene Arlyce Dice, MD  Outpatient Primary MD for the patient is [name], MD  LOS - 58  Chief Complaint  Patient presents with   Skin Ulcer       Subjective:   --Appears comfortable -No verbal response today -Husband at bedside ---Pt has been made NPO for now as she is not awake or alert enough to safely feed by mouth. Husband has requested permission to be able to feed her by mouth when he is here visiting---Husband accepts aspiration risk -On IV dextrose solution due to hypoglycemia risk as a result of lack of oral intake  Assessment  & Plan :    Principal Problem:   Severe sepsis with septic shock - Klebsiella/Proteus/E. coli/Bacteroides and Staph  Active Problems:   S/P ICD (internal cardiac defibrillator) procedure, with upgrade to ICD/CRT-D  [employer]   Cardiomyopathy, ischemic EF 20 to 25 %/history of combined systolic and diastolic dysfunction CHF   Decubitus ulcer of right hip, stage 4 /Infected with E-coli and Proteus   Failure to thrive in adult   Decubitus ulcer of left hip, stage 3/Infected with E-coli and Proteus   Goals of care, counseling/discussion   Hematochezia   Sepsis secondary to Klebsiella UTI ([name])   Anemia due to acute blood loss/rectal bleeding   Type II diabetes mellitus ([place])   AKI (acute kidney injury) ([place])   Thrombocytopenia Acquired   Hypertension   Mobitz type 2 second degree atrioventricular block   Polymorphic ventricular tachycardia (HCC)   Sepsis (HCC)   Stage I pressure ulcer of sacral region   Bacteremia due to Gram-negative bacteria   Gram negative sepsis ([place])   Palliative care by specialist   Encounter for hospice care discussion   Brief Summary 85 year old female with a history of advanced Alzheimer's  dementia, CAD, H/o combined systolic and diastolic dysfunction CHF/HFrEF/ischemic cardiomyopathy with EF 20 to 25%, h/o torsades VT status post AICD, H/o HTN, DM2, HLD, admitted on 10/26/2019 from home with metabolic encephalopathy in the setting of presumed infection (leukocytosis and Lactic acid was 3.1) with multiple decubitus ulcers -Weaned off IV Levophed on 10/22/19 -Requiring warming blanket from time to time ---Patient still unable to have oral intake due to advanced cognitive deficits--- she will spit food out, clenching her jaw and resist any attempt to feed her  --Pt has been made NPO for now as she is not awake or alert enough to safely feed by mouth. Husband has requested permission to be able to feed her by mouth when he is here visiting----Husband accepts aspiration risk -overall prognosis remains poor due to inability to take oral intake --On IV dextrose solution due to hypoglycemia risk as a result of lack of oral intake  Assessment/Plan: 1)Severe sepsis with septic shock-- POA -Etiology is multifactorial:-Patient has Klebsiella UTI (urine cx 10/17/19), E. coli and Proteus wound decubitus ulcer/wound infection (wound cx from 10/08/2019 and 10/17/19) and staph epi and Bacteroides bacteremia from blood culture dated 10/21/2019 -Weaned off IV Levophed on 10/22/19 --Stopped vancomycin after 10/21/2019 -MRSA PCR negative on 10/20/2019 -Repeat blood culture from 10/21/2019 NGTD -Patient remains encephalopathic, at baseline she  does have advanced dementia  with significant cognitive and memory deficits -WBC is down to 5.4  from a peak of 25.6 -PCT was  2.91, ESR 60, CRP 14.1 - overall prognosis remains poor especially given lack of oral intake -Antimicrobials/Anti-fungal this admission: - Diflucan 3/16 and 3/17  -Ceftriaxone 10/22/2019 through 10/28/2019 inclusive  --metronidazole -10/13/2019 through 10/26/2019 inclusive  --zosyn x 1 on 10/15/2019 -- cefepime 11/07/2019 through 10/22/19 -Vanc --10/28/2019  through 10/22/19 -Eraxis -10/25/2019 through 10/28/2019 inclusive --Intermittent episodes of hypothermia requiring warming blanket from time to time--hypothermia resolved at this time  2)Bacteremia--B.fragilis and staph epi--- ???  Significance of blood culture from 10/10/2019 -??  Contaminant, however patient was septic --Sepsis pathophysiology and Antibiotics as above #1 -Repeat blood cultures on 10/21/2019 NGTD - 3)Infected Bilateral Hip and Sacral decubitus ulcers--please see photos in epic -10/08/2019 CT abdomen/pelvis--right lateral decubitus ulcer with subcutaneous inflammatory changes extending toward the greater trochanter. There is no bony destructive changes. There is considerable inflammatory change with air tracking toward the lateral aspect of the femur. -Since wounds are bilateral, is difficult to position patient so the pressure is offloaded from wounds -General surgery consultappreciated -10/17/19--bedside debridement -Wound cultures from 10/09/2019 and 10/17/2019 with E. coli and Proteus --Wound culture with fungus reluctant to use Diflucan due to prolonged QT concerns---okay to treat with anidulafungin -Sepsis pathophysiology and completed antibiotics as above #1  4) Klebsiella UTI--urine culture from 10/17/2019 noted --Sepsis pathophysiology and completed antibiotics as above #1  5)Failure to thrive -Serum B12--199-->supplement -TSH--37.364, Free T4--0.28 -Folate--7.2 -PT evaluation-->SNF -continues to have poor po intake -Cognitive status and encephalopathy makes it difficult to feed orally ---Patient still unable to have oral intake due to advanced cognitive deficits--- she will spit food out, clenching her jaw and resist any attempt to feed her -Continue judicious IV dextrose fluids given that patient's EF is down to 20 to 25% -Borderline blood glucose despite IV dextrose infusion --Remains very challenging to feed patient as she closes her mouth , clenching her jaw and  spits out anything -husband has decided against NG tube/Keofeed Tube and against  PEG/G-tube placement  ---Pt has been made NPO for now as she is not awake or alert enough to safely feed by mouth. Husband has requested permission to be able to feed her by mouth when he is here visiting--Husband accepts aspiration risk - 6)Symptomatic acute on chronic blood loss anemia due to Rectal bleeding-hemoglobin is up  to 7.8 from 6.9 after transfusion of 2 units of PRBC on 10/21/2019 hypotension resolved -No further rectal bleeding -Protonix for GI prophylaxis GI consult appreciated suspect rectal injury from fecal impaction--suppositories as ordered -Low platelets noted (28K) ,  no bleeding noted Suspect thrombocytopenia and anemia of critical illness / - infection and malnutrition related. -Consider transfusion if family desires if platelet count below 10K or if bleeding occurs  7)CKD stage 3b -initially felt to be AKI but now more likely progression of CKD -pt likely has progression of underlying CKD -Creatinine hovering below 2 -No recent renal labs available to compare current values to -renal US-->medical renal disease -Creatinine is down to 1.3 from a peak of 1.83 - renally adjust medications, avoid nephrotoxic agents / dehydration  / hypotension -Very scant urine output in Foley bag -Foley was placed due to urinary retention  8)H/o combined systolic and diastolic dysfunction CHF/HFrEF/ischemic cardiomyopathy with EF 20 to 25%- -02/2015 echo EF 20-25%, G2DD -BP improved with iVF   - c/n Judicious IV fluids  9)Hypernatremia/dehydration -Resolved with IV fluids, --On  IV dextrose solution due to hypoglycemia risk as a result of lack of oral intake  10)Alzheimer's dementia/ambulatory dysfunction -Continue Namenda -spouse states pt screams frequently at home -At baseline patient has very advanced dementia with severe cognitive and memory deficits, she has been bed-bound for the last 3  months PTA, requiring total care  11)Coronary artery disease -hx of CABG in 2009 (LIMA-LAD, SVG-ramus, SVG-OM). Hx of RCA stent 04/2012 -no chest pain presently Discontinued Aspirin and Plavix due to rectal bleeding with symptomatic Anemia -Continue statin -Coreg on hold due to hypotension  12)Social/Ethics--- overall prognosis is poor, patient is a DNR/DNI -Plan of care, goals of care discussed with patient's husband at bedside -Official palliative care consult appreciated -Family declined comfort care and hospice at this time -Very advanced dementia with significant cognitive and memory deficits--- patient is total care   13)Thrombocytopenia---  Platelets 206 >> 188>>> 117>> 74>> 48>>> 38>>32>>27>>24>>18>>19>>28.  -- Possibly related to underlying infectious process.  No signs of bleeding at this time.   --Consider transfusion (if family desires) if platelet count below 10K or if bleeding occurs  14)Ventricular Tachycardia/H/o Torsades -status post AICD placement - she has CRT-D device followed by EP - she underwent ICD upgrade of single chamber ICD to [employer] CRT-D  Disposition Plan: Patient From: Home D/C Place: Hospice at Home [facility]s [facility] Barriers: Not Clinically Stable--poor po intake, -Borderline blood glucose despite IV dextrose infusion --Remains very challenging to feed patient as she closes her mouth , clenching her jaw and spits out anything -She is not eating-----Platelets are too low to do PEG Procedure even of family wanted PEG (family previously declined PEG)--- --Family declined comfort care and hospice at this time -Will also need continued conversations with palliative care around prognosis  Code Status : DNR  Procedures-- bedside decubitus ulcer debridement on 10/17/2019 -Right arm PICC line placement on 10/21/2019 -transfusion of 2 units of PRBC on 10/21/2019 -Bedside decubitus debridement by Dr. Constance Haw on 10/22/2019  Family Communication:     Discussed with husband  Consults  : Palliative care and general surgery  DVT Prophylaxis  :  Teds and SCDs/rectal bleeding/low platelets  Lab Results  Component Value Date   PLT 28 (LL) 11/04/2019   Inpatient Medications  Scheduled Meds:  Chlorhexidine Gluconate Cloth  6 each Topical Daily   collagenase   Topical BID   feeding supplement (ENSURE ENLIVE)  237 mL Oral TID   hydrocortisone  25 mg Rectal BID   levothyroxine  37.5 mcg Intravenous Q0600   mouth rinse  15 mL Mouth Rinse BID   pantoprazole (PROTONIX) IV  40 mg Intravenous Q24H   polyethylene glycol  17 g Oral Daily   sodium chloride flush  10-40 mL Intracatheter Q12H   Continuous Infusions:  sodium chloride 10 mL/hr at 10/21/19 2018   dextrose 5 % and 0.45% NaCl 75 mL/hr at 11/05/19 0138   PRN Meds:.sodium chloride, acetaminophen **OR** acetaminophen, bisacodyl, fentaNYL (SUBLIMAZE) injection, LORazepam, ondansetron (ZOFRAN) IV, sodium chloride flush   Anti-infectives (From admission, onward)   Start     Dose/Rate Route Frequency Ordered Stop   10/25/19 1500  anidulafungin (ERAXIS) 100 mg in sodium chloride 0.9 % 100 mL IVPB     100 mg 78 mL/hr over 100 Minutes Intravenous Every 24 hours 10/24/19 1728 10/28/19 1803   10/24/19 1400  fluconazole (DIFLUCAN) IVPB 200 mg  Status:  Discontinued     200 mg 100 mL/hr over 60 Minutes Intravenous Every 24 hours 10/23/19 1144 10/24/19  1717   10/23/19 1400  fluconazole (DIFLUCAN) IVPB 400 mg     400 mg 100 mL/hr over 120 Minutes Intravenous  Once 10/23/19 1144 10/23/19 2302   10/22/19 2200  cefTRIAXone (ROCEPHIN) 2 g in sodium chloride 0.9 % 100 mL IVPB     2 g 200 mL/hr over 30 Minutes Intravenous Every 24 hours 10/22/19 1022 10/28/19 2259   10/17/19 2359  vancomycin (VANCOREADY) IVPB 1250 mg/250 mL     1,250 mg 166.7 mL/hr over 90 Minutes Intravenous Every 48 hours 10/13/2019 2022 10/22/19 2222   10/28/2019 2359  metroNIDAZOLE (FLAGYL) IVPB 500 mg  Status:   Discontinued     500 mg 100 mL/hr over 60 Minutes Intravenous Every 8 hours 10/17/2019 2047 10/26/19 1604   10/28/2019 2359  ceFEPIme (MAXIPIME) 2 g in sodium chloride 0.9 % 100 mL IVPB  Status:  Discontinued     2 g 200 mL/hr over 30 Minutes Intravenous Every 24 hours 10/10/2019 2012 10/22/19 1022   10/23/2019 1430  vancomycin (VANCOCIN) IVPB 1000 mg/200 mL premix     1,000 mg 200 mL/hr over 60 Minutes Intravenous  Once 11/03/2019 1428 10/31/2019 1713   10/14/2019 1430  piperacillin-tazobactam (ZOSYN) IVPB 3.375 g     3.375 g 100 mL/hr over 30 Minutes Intravenous  Once 10/27/2019 1428 10/22/2019 1607       Objective:   Vitals:   11/04/19 0501 11/04/19 1415 11/04/19 2131 11/05/19 0558  BP: (!) 119/57 106/64 104/61 105/71  Pulse: 88 98 100 (!) 107  Resp: '16 20 20 20  ' Temp: (!) 97.5 F (36.4 C) 98.6 F (37 C) (!) 97.5 F (36.4 C) 97.7 F (36.5 C)  TempSrc: Axillary  Oral Oral  SpO2: 100% 100% 100% 100%  Weight:      Height:        Wt Readings from Last 3 Encounters:  10/22/19 80.2 kg  06/13/19 78.5 kg  10/19/18 73.6 kg    Intake/Output Summary (Last 24 hours) at 11/05/2019 1444 Last data filed at 11/05/2019 1400 Gross per 24 hour  Intake 10 ml  Output 900 ml  Net -890 ml   Physical Exam  General exam: -Wakes up to verbal and tactile stimuli respiratory system: Fair air movement, no wheezing Cardiovascular system:RRR.  Prior sternotomy scar Gastrointestinal system: Abdomen is nondistended, soft and nontender, Normal bowel sounds heard. Central nervous system:-Bedbound, no purposeful movements   Extremities: Some pitting edema of upper and lower extremities, +pedal pulses Skin: --Multiple skin decubitus please see photos in epic Psychiatry: Very advanced dementia with significant cognitive and memory deficits--- patient is total care GU-Foley with scant urine MSK-right arm PICC line     Data Review:   Micro Results No results found for this or any previous visit (from the past 240  hour(s)).  Radiology Reports CT ABDOMEN PELVIS WO CONTRAST  Result Date: 10/09/2019 CLINICAL DATA:  Decubitus ulcers EXAM: CT ABDOMEN AND PELVIS WITHOUT CONTRAST TECHNIQUE: Multidetector CT imaging of the abdomen and pelvis was performed following the standard protocol without IV contrast. COMPARISON:  None. FINDINGS: Lower chest: No acute abnormality. Hepatobiliary: Scattered calcifications are noted within the liver. Gallbladder is well distended with multiple dependent gallstones. Pancreas: Unremarkable. No pancreatic ductal dilatation or surrounding inflammatory changes. Spleen: Normal in size without focal abnormality. Adrenals/Urinary Tract: Adrenal glands are within normal limits. Kidneys are well visualized bilaterally with renal vascular calcifications. No renal stones are seen. No obstructive changes are noted. The bladder is partially distended. Stomach/Bowel: The appendix is not  well visualized although no inflammatory changes to suggest appendicitis are noted. Retained fecal material is noted throughout the colon consistent with a mild degree of constipation without obstructive change. No small bowel abnormality is seen. Stomach is unremarkable. Vascular/Lymphatic: Aortic atherosclerosis. No enlarged abdominal or pelvic lymph nodes. Reproductive: Uterus and bilateral adnexa are unremarkable. Other: No abdominal wall hernia or abnormality. No abdominopelvic ascites. Musculoskeletal: Mild changes of anasarca are noted. Decubitus ulcer is noted laterally near the right hip with inflammatory changes extending from the skin towards the greater trochanter. This area measures approximately 7.6 x 4.3 cm. No definitive bony erosive changes to suggest osteomyelitis are noted at this time. No other definitive decubitus ulcer is seen. Degenerative changes of lumbar spine are seen. IMPRESSION: Right lateral decubitus ulcer with subcutaneous inflammatory changes extending towards the greater trochanter of the  right proximal femur. No definitive bony destructive changes are seen. Considerable subcutaneous inflammatory changes noted with air tracking towards the lateral aspect of the femur. Changes of mild constipation. Cholelithiasis without complicating factors. Electronically Signed   By: Inez Catalina M.D.   On: 10/13/2019 16:51   US RENAL  Result Date: 10/18/2019 CLINICAL DATA:  Acute renal insufficiency. EXAM: RENAL / URINARY TRACT ULTRASOUND COMPLETE COMPARISON:  10/22/2019 CT FINDINGS: Right Kidney: Renal measurements: 9.3 x 4.4 x 5.8 cm = volume: 123 mL. Increased renal echogenicity. No hydronephrosis. Left Kidney: Renal measurements: 9.6 x 5.2 x 4.6 cm = volume: 119 mL. Increased renal echogenicity. No hydronephrosis. Bladder: Decompressed. Other: Trace perinephric edema/fluid likely relates to renal insufficiency. Note is made of gallbladder sludge and stones including at 1.0 cm. IMPRESSION: 1. No hydronephrosis. Increased renal echogenicity, consistent with medical renal disease. 2. Cholelithiasis without acute cholecystitis. Electronically Signed   By: Abigail Miyamoto M.D.   On: 10/18/2019 11:51   DG Chest Port 1 View  Result Date: 11/06/2019 CLINICAL DATA:  Sepsis EXAM: PORTABLE CHEST 1 VIEW COMPARISON:  11/05/2014 FINDINGS: Stable positioning of a left-sided implanted cardiac device. Post CABG changes. Stable cardiomediastinal contours. No focal airspace consolidation, pleural effusion, or pneumothorax. IMPRESSION: No acute cardiopulmonary findings. Electronically Signed   By: Davina Poke D.O.   On: 11/05/2019 14:47   [place] EKG SITE RITE  Result Date: 10/20/2019 If Site Rite image not attached, placement could not be confirmed due to current cardiac rhythm.   CBC Recent Labs  Lab 10/31/19 0449 11/02/19 0505 11/04/19 0518  WBC 5.6 5.5 5.4  HGB 7.7* 8.1* 7.8*  HCT 25.2* 25.6* 25.7*  PLT 18* 19* 28*  MCV 94.0 94.5 94.5  MCH 28.7 29.9 28.7  MCHC 30.6 31.6 30.4  RDW 17.6* 17.9* 17.8*     Chemistries  Recent Labs  Lab 10/31/19 0449 11/02/19 0505 11/04/19 0518  NA 142 142 141  K 3.0* 3.4* 3.9  CL 118* 117* 117*  CO2 17* 17* 18*  GLUCOSE 81 98 96  BUN 36* 31* 27*  CREATININE 1.38* 1.30* 1.23*  CALCIUM 7.9* 7.8* 7.7*   ------------------------------------------------------------------------------------------------------------------ No results for input(s): CHOL, HDL, LDLCALC, TRIG, CHOLHDL, LDLDIRECT in the last 72 hours.  Lab Results  Component Value Date   HGBA1C 5.5 10/19/2019   ------------------------------------------------------------------------------------------------------------------ No results for input(s): TSH, T4TOTAL, T3FREE, THYROIDAB in the last 72 hours.  Invalid input(s): FREET3 ------------------------------------------------------------------------------------------------------------------ No results for input(s): VITAMINB12, FOLATE, FERRITIN, TIBC, IRON, RETICCTPCT in the last 72 hours.  Coagulation profile No results for input(s): INR, PROTIME in the last 168 hours.  No results for input(s): DDIMER in the last 72 hours.  Cardiac Enzymes No results for input(s): CKMB, TROPONINI, MYOGLOBIN in the last 168 hours.  Invalid input(s): CK     Component Value Date/Time   BNP 583.7 (H) 11/01/2014 9597   Roxan Hockey M.D on 11/05/2019 at 2:44 PM  Go to [email or web address] - for contact info  [facility] - Office  [phone]

## 2019-11-06 LAB — BASIC METABOLIC PANEL
Anion gap: 5 (ref 5–15)
Anion gap: 6 (ref 5–15)
BUN: 25 mg/dL — ABNORMAL HIGH (ref 8–23)
BUN: 25 mg/dL — ABNORMAL HIGH (ref 8–23)
CO2: 17 mmol/L — ABNORMAL LOW (ref 22–32)
CO2: 17 mmol/L — ABNORMAL LOW (ref 22–32)
Calcium: 7.7 mg/dL — ABNORMAL LOW (ref 8.9–10.3)
Calcium: 7.6 mg/dL — ABNORMAL LOW (ref 8.9–10.3)
Chloride: 116 mmol/L — ABNORMAL HIGH (ref 98–111)
Chloride: 115 mmol/L — ABNORMAL HIGH (ref 98–111)
Creatinine, Ser: 1.32 mg/dL — ABNORMAL HIGH (ref 0.44–1.00)
Creatinine, Ser: 1.32 mg/dL — ABNORMAL HIGH (ref 0.44–1.00)
GFR calc Af Amer: 43 mL/min — ABNORMAL LOW (ref >60)
GFR calc Af Amer: 43 mL/min — ABNORMAL LOW (ref >60)
GFR calc non Af Amer: 37 mL/min — ABNORMAL LOW (ref >60)
GFR calc non Af Amer: 37 mL/min — ABNORMAL LOW (ref >60)
Glucose, Bld: 118 mg/dL — ABNORMAL HIGH (ref 70–99)
Glucose, Bld: 132 mg/dL — ABNORMAL HIGH (ref 70–99)
Potassium: 3.8 mmol/L (ref 3.5–5.1)
Potassium: 3.9 mmol/L (ref 3.5–5.1)
Sodium: 138 mmol/L (ref 135–145)
Sodium: 138 mmol/L (ref 135–145)

## 2019-11-06 LAB — CBC
HCT: 25.1 % — ABNORMAL LOW (ref 36.0–46.0)
HCT: 24.4 % — ABNORMAL LOW (ref 36.0–46.0)
Hemoglobin: 7.6 g/dL — ABNORMAL LOW (ref 12.0–15.0)
Hemoglobin: 7.5 g/dL — ABNORMAL LOW (ref 12.0–15.0)
MCH: 28.6 pg (ref 26.0–34.0)
MCH: 29.3 pg (ref 26.0–34.0)
MCHC: 30.3 g/dL (ref 30.0–36.0)
MCHC: 30.7 g/dL (ref 30.0–36.0)
MCV: 94.4 fL (ref 80.0–100.0)
MCV: 95.3 fL (ref 80.0–100.0)
Platelets: 33 10*3/uL — ABNORMAL LOW (ref 150–400)
Platelets: 32 10*3/uL — ABNORMAL LOW (ref 150–400)
RBC: 2.66 MIL/uL — ABNORMAL LOW (ref 3.87–5.11)
RBC: 2.56 MIL/uL — ABNORMAL LOW (ref 3.87–5.11)
RDW: 17.7 % — ABNORMAL HIGH (ref 11.5–15.5)
RDW: 17.6 % — ABNORMAL HIGH (ref 11.5–15.5)
WBC: 6.8 10*3/uL (ref 4.0–10.5)
WBC: 5.8 10*3/uL (ref 4.0–10.5)
nRBC: 0 % (ref 0.0–0.2)
nRBC: 0 % (ref 0.0–0.2)

## 2019-11-06 LAB — GLUCOSE, CAPILLARY
Glucose-Capillary: 105 mg/dL — ABNORMAL HIGH (ref 70–99)
Glucose-Capillary: 107 mg/dL — ABNORMAL HIGH (ref 70–99)
Glucose-Capillary: 112 mg/dL — ABNORMAL HIGH (ref 70–99)
Glucose-Capillary: 118 mg/dL — ABNORMAL HIGH (ref 70–99)
Glucose-Capillary: 121 mg/dL — ABNORMAL HIGH (ref 70–99)

## 2019-11-06 NOTE — Progress Notes (Signed)
Patient Demographics:    [name], is a 85 y.o. female, DOB - Mar 29, 1935, NOI:[id number]  Admit date - 10/24/2019   Admitting Physician Bethena Roys, MD  Outpatient Primary MD for the patient is [name], MD  LOS - 21  Chief Complaint  Patient presents with   Skin Ulcer       Subjective:    Discussed with Elmo Putt from palliative care -On IV dextrose solution due to hypoglycemia risk as a result of lack of oral intake --If platelet count gets above 50,000 may be a candidate for PEG placement  Assessment  & Plan :    Principal Problem:   Severe sepsis with septic shock - Klebsiella/Proteus/E. coli/Bacteroides and Staph  Active Problems:   S/P ICD (internal cardiac defibrillator) procedure, with upgrade to ICD/CRT-D  [employer]   Cardiomyopathy, ischemic EF 20 to 25 %/history of combined systolic and diastolic dysfunction CHF   Decubitus ulcer of right hip, stage 4 /Infected with E-coli and Proteus   Failure to thrive in adult   Decubitus ulcer of left hip, stage 3/Infected with E-coli and Proteus   Goals of care, counseling/discussion   Hematochezia   Sepsis secondary to Klebsiella UTI ([place])   Anemia due to acute blood loss/rectal bleeding   Type II diabetes mellitus ([place])   AKI (acute kidney injury) ([place])   Thrombocytopenia Acquired   Hypertension   Mobitz type 2 second degree atrioventricular block   Polymorphic ventricular tachycardia (HCC)   Sepsis ([place])   Stage I pressure ulcer of sacral region   Bacteremia due to Gram-negative bacteria   Gram negative sepsis ([place])   Palliative care by specialist   Encounter for hospice care discussion   Brief Summary 85 year old female with a history of advanced Alzheimer's dementia, CAD, H/o combined systolic and diastolic dysfunction CHF/HFrEF/ischemic cardiomyopathy with EF 20 to 25%, h/o torsades VT status post AICD, H/o HTN, DM2,  HLD, admitted on 10/30/2019 from home with metabolic encephalopathy in the setting of presumed infection (leukocytosis and Lactic acid was 3.1) with multiple decubitus ulcers -Weaned off IV Levophed on 10/22/19 -Requiring warming blanket from time to time ---Patient still unable to have oral intake due to advanced cognitive deficits--- she will spit food out, clenching her jaw and resist any attempt to feed her  --Pt has been made NPO for now as she is not awake or alert enough to safely feed by mouth. Husband has requested permission to be able to feed her by mouth when he is here visiting----Husband accepts aspiration risk -overall prognosis remains poor due to inability to take oral intake --On IV dextrose solution due to hypoglycemia risk as a result of lack of oral intake -If platelet count gets above 50,000 may be a candidate for PEG placement  Assessment/Plan: 1)Severe sepsis with septic shock-- POA -Etiology is multifactorial:-Patient has Klebsiella UTI (urine cx 10/17/19), E. coli and Proteus wound decubitus ulcer/wound infection (wound cx from 11/07/2019 and 10/17/19) and staph epi and Bacteroides bacteremia from blood culture dated 10/18/2019 -Weaned off IV Levophed on 10/22/19 --Stopped vancomycin after 10/21/2019 -MRSA PCR negative on 10/20/2019 -Repeat blood culture from 10/21/2019 NGTD -Patient remains encephalopathic, at baseline she does have advanced dementia  with significant cognitive and memory deficits -WBC is down to 5.4  from a peak of 25.6 -PCT was  2.91, ESR 60, CRP 14.1 - overall prognosis remains poor especially given lack of oral intake -Antimicrobials/Anti-fungal this admission: - Diflucan 3/16 and 3/17  -Ceftriaxone 10/22/2019 through 10/28/2019 inclusive  --metronidazole -10/29/2019 through 10/26/2019 inclusive  --zosyn x 1 on 11/02/2019 -- cefepime 10/31/2019 through 10/22/19 -Vanc --10/13/2019 through 10/22/19 -Eraxis -10/25/2019 through 10/28/2019 inclusive --No further episodes of  hypothermia requiring warming blanket from time to time--hypothermia resolved at this time  2)Bacteremia--B.fragilis and staph epi--- ???  Significance of blood culture from 10/17/2019 -??  Contaminant, however patient was septic --Sepsis pathophysiology and Antibiotics as above #1 -Repeat blood cultures on 10/21/2019 NGTD - 3)Infected Bilateral Hip and Sacral decubitus ulcers--please see photos in epic -10/29/2019 CT abdomen/pelvis--right lateral decubitus ulcer with subcutaneous inflammatory changes extending toward the greater trochanter. There is no bony destructive changes. There is considerable inflammatory change with air tracking toward the lateral aspect of the femur. -Since wounds are bilateral, is difficult to position patient so the pressure is offloaded from wounds -General surgery consultappreciated -10/17/19--bedside debridement -Wound cultures from 10/23/2019 and 10/17/2019 with E. coli and Proteus --Wound culture with fungus reluctant to use Diflucan due to prolonged QT concerns---okay to treat with anidulafungin -Sepsis pathophysiology and completed antibiotics as above #1  4) Klebsiella UTI--urine culture from 10/17/2019 noted --Sepsis pathophysiology and completed antibiotics as above #1  5)Failure to thrive -Serum B12--199-->supplement -TSH--37.364, Free T4--0.28 -Folate--7.2 -PT evaluation-->SNF -continues to have poor po intake -Cognitive status and encephalopathy makes it difficult to feed orally ---Patient still unable to have oral intake due to advanced cognitive deficits--- she will spit food out, clenching her jaw and resist any attempt to feed her -Continue judicious IV dextrose fluids given that patient's EF is down to 20 to 25% -Borderline blood glucose despite IV dextrose infusion --Remains very challenging to feed patient as she closes her mouth , clenching her jaw and spits out anything ---Pt has been made NPO for now as she is not awake or alert enough to  safely feed by mouth. Husband has requested permission to be able to feed her by mouth when he is here visiting--Husband accepts aspiration risk -If platelet count gets above 50,000 may be a candidate for PEG placement  6)Symptomatic acute on chronic blood loss anemia due to Rectal bleeding-hemoglobin is up  to 7.8 from 6.9 after transfusion of 2 units of PRBC on 10/21/2019 hypotension resolved -No further rectal bleeding -Protonix for GI prophylaxis GI consult appreciated suspect rectal injury from fecal impaction--suppositories as ordered -Low platelets noted (33K) ,  no bleeding noted Suspect thrombocytopenia and anemia of critical illness / - infection and malnutrition related. -Consider transfusion if family desires if platelet count below 10K or if bleeding occurs  7)CKD stage 3b -initially felt to be AKI but now more likely progression of CKD -pt likely has progression of underlying CKD -Creatinine hovering below 2 -No recent renal labs available to compare current values to -renal US-->medical renal disease -Creatinine is down to 1.3 from a peak of 1.83 - renally adjust medications, avoid nephrotoxic agents / dehydration  / hypotension -Very scant urine output in Foley bag -Foley was placed due to urinary retention  8)H/o combined systolic and diastolic dysfunction CHF/HFrEF/ischemic cardiomyopathy with EF 20 to 25%- -02/2015 echo EF 20-25%, G2DD -BP improved with iVF   - c/n Judicious IV fluids  9)Hypernatremia/dehydration -Resolved with IV fluids, --On IV dextrose solution due to hypoglycemia risk as a result of lack of oral intake  10)Alzheimer's dementia/ambulatory dysfunction -Continue Namenda -spouse states pt screams frequently at home -At baseline patient has very advanced dementia with severe cognitive and memory deficits, she has been bed-bound for the last 3 months PTA, requiring total care  11)Coronary artery disease -hx of CABG in 2009 (LIMA-LAD,  SVG-ramus, SVG-OM). Hx of RCA stent 04/2012 -no chest pain presently Discontinued Aspirin and Plavix due to rectal bleeding with symptomatic Anemia -Continue statin -Coreg on hold due to hypotension  12)Social/Ethics--- overall prognosis is poor, patient is a DNR/DNI -Plan of care, goals of care discussed with patient's husband at bedside -Official palliative care consult appreciated -Family declined comfort care and hospice at this time -Very advanced dementia with significant cognitive and memory deficits--- patient is total care   13)Thrombocytopenia---  Platelets 206 >> 188>>> 117>> 74>> 48>>> 38>>32>>27>>24>>18>>19>>28>>33k.  -- Possibly related to underlying infectious process.  No signs of bleeding at this time.   --Consider transfusion (if family desires) if platelet count below 10K or if bleeding occurs  14)Ventricular Tachycardia/H/o Torsades -status post AICD placement - she has CRT-D device followed by EP - she underwent ICD upgrade of single chamber ICD to [employer] CRT-D  Disposition Plan: Patient From: Home D/C Place: Hospice at Home Vs [facility]idential [facility] Barriers: Not Clinically Stable--poor po intake, -Borderline blood glucose despite IV dextrose infusion --Remains very challenging to feed patient as she closes her mouth , clenching her jaw and spits out anything -She is not eating-----Platelets are too low to do PEG Procedure even of family wanted PEG (family previously declined PEG)--- =If platelet count gets above 50,000 may be a candidate for PEG placement -Family declined comfort care and hospice at this time -Will also need continued conversations with palliative care around prognosis  Code Status : DNR  Procedures-- bedside decubitus ulcer debridement on 10/17/2019 -Right arm PICC line placement on 10/21/2019 -transfusion of 2 units of PRBC on 10/21/2019 -Bedside decubitus debridement by Dr. Constance Haw on 10/22/2019  Family Communication:    Discussed  with husband  Consults  : Palliative care and general surgery  DVT Prophylaxis  :  Teds and SCDs/rectal bleeding/low platelets  Lab Results  Component Value Date   PLT 32 (L) 11/06/2019   Inpatient Medications  Scheduled Meds:  Chlorhexidine Gluconate Cloth  6 each Topical Daily   collagenase   Topical BID   feeding supplement (ENSURE ENLIVE)  237 mL Oral TID   hydrocortisone  25 mg Rectal BID   levothyroxine  37.5 mcg Intravenous Q0600   mouth rinse  15 mL Mouth Rinse BID   pantoprazole (PROTONIX) IV  40 mg Intravenous Q24H   polyethylene glycol  17 g Oral Daily   sodium chloride flush  10-40 mL Intracatheter Q12H   Continuous Infusions:  sodium chloride 10 mL/hr at 10/21/19 2018   dextrose 5 % and 0.45% NaCl 75 mL/hr at 11/06/19 0608   PRN Meds:.sodium chloride, acetaminophen **OR** acetaminophen, bisacodyl, fentaNYL (SUBLIMAZE) injection, LORazepam, ondansetron (ZOFRAN) IV, sodium chloride flush   Anti-infectives (From admission, onward)   Start     Dose/Rate Route Frequency Ordered Stop   10/25/19 1500  anidulafungin (ERAXIS) 100 mg in sodium chloride 0.9 % 100 mL IVPB     100 mg 78 mL/hr over 100 Minutes Intravenous Every 24 hours 10/24/19 1728 10/28/19 1803   10/24/19 1400  fluconazole (DIFLUCAN) IVPB 200 mg  Status:  Discontinued     200 mg 100 mL/hr over 60 Minutes Intravenous Every 24 hours 10/23/19 1144 10/24/19 1717  10/23/19 1400  fluconazole (DIFLUCAN) IVPB 400 mg     400 mg 100 mL/hr over 120 Minutes Intravenous  Once 10/23/19 1144 10/23/19 2302   10/22/19 2200  cefTRIAXone (ROCEPHIN) 2 g in sodium chloride 0.9 % 100 mL IVPB     2 g 200 mL/hr over 30 Minutes Intravenous Every 24 hours 10/22/19 1022 10/28/19 2259   10/17/19 2359  vancomycin (VANCOREADY) IVPB 1250 mg/250 mL     1,250 mg 166.7 mL/hr over 90 Minutes Intravenous Every 48 hours 10/13/2019 2022 10/22/19 2222   10/30/2019 2359  metroNIDAZOLE (FLAGYL) IVPB 500 mg  Status:  Discontinued       500 mg 100 mL/hr over 60 Minutes Intravenous Every 8 hours 10/17/2019 2047 10/26/19 1604   10/13/2019 2359  ceFEPIme (MAXIPIME) 2 g in sodium chloride 0.9 % 100 mL IVPB  Status:  Discontinued     2 g 200 mL/hr over 30 Minutes Intravenous Every 24 hours 11/03/2019 2012 10/22/19 1022   11/07/2019 1430  vancomycin (VANCOCIN) IVPB 1000 mg/200 mL premix     1,000 mg 200 mL/hr over 60 Minutes Intravenous  Once 10/19/2019 1428 10/28/2019 1713   10/15/2019 1430  piperacillin-tazobactam (ZOSYN) IVPB 3.375 g     3.375 g 100 mL/hr over 30 Minutes Intravenous  Once 11/01/2019 1428 10/27/2019 1607       Objective:   Vitals:   11/05/19 1400 11/05/19 2126 11/06/19 0458 11/06/19 1225  BP: 101/69 126/68 (!) 130/55   Pulse: (!) 101 94 98   Resp: _0 Temp: 97.9 F (36.6 C) 98.8 F (37.1 C) (!) 97.4 F (36.3 C)   TempSrc:  Oral Oral   SpO2: 100% 100% 100% 96%  Weight:      Height:        Wt Readings from Last 3 Encounters:  10/22/19 80.2 kg  06/13/19 78.5 kg  10/19/18 73.6 kg   No intake or output data in the 24 hours ending 11/06/19 1937 Physical Exam  General exam: -Wakes up to verbal and tactile stimuli respiratory system: Fair air movement, no wheezing Cardiovascular system:RRR.  Prior sternotomy scar Gastrointestinal system: Abdomen is nondistended, soft and nontender, Normal bowel sounds heard. Central nervous system:-Bedbound, no purposeful movements   Extremities: Some pitting edema of upper and lower extremities, +pedal pulses Skin: --Multiple skin decubitus please see photos in epic Psychiatry: Very advanced dementia with significant cognitive and memory deficits--- patient is total care GU-Foley with scant urine MSK-right arm PICC line     Data Review:   Micro Results No results found for this or any previous visit (from the past 240 hour(s)).  Radiology Reports CT ABDOMEN PELVIS WO CONTRAST  Result Date: 10/18/2019 CLINICAL DATA:  Decubitus ulcers EXAM: CT ABDOMEN AND PELVIS  WITHOUT CONTRAST TECHNIQUE: Multidetector CT imaging of the abdomen and pelvis was performed following the standard protocol without IV contrast. COMPARISON:  None. FINDINGS: Lower chest: No acute abnormality. Hepatobiliary: Scattered calcifications are noted within the liver. Gallbladder is well distended with multiple dependent gallstones. Pancreas: Unremarkable. No pancreatic ductal dilatation or surrounding inflammatory changes. Spleen: Normal in size without focal abnormality. Adrenals/Urinary Tract: Adrenal glands are within normal limits. Kidneys are well visualized bilaterally with renal vascular calcifications. No renal stones are seen. No obstructive changes are noted. The bladder is partially distended. Stomach/Bowel: The appendix is not well visualized although no inflammatory changes to suggest appendicitis are noted. Retained fecal material is noted throughout the colon consistent with a mild degree of constipation without  obstructive change. No small bowel abnormality is seen. Stomach is unremarkable. Vascular/Lymphatic: Aortic atherosclerosis. No enlarged abdominal or pelvic lymph nodes. Reproductive: Uterus and bilateral adnexa are unremarkable. Other: No abdominal wall hernia or abnormality. No abdominopelvic ascites. Musculoskeletal: Mild changes of anasarca are noted. Decubitus ulcer is noted laterally near the right hip with inflammatory changes extending from the skin towards the greater trochanter. This area measures approximately 7.6 x 4.3 cm. No definitive bony erosive changes to suggest osteomyelitis are noted at this time. No other definitive decubitus ulcer is seen. Degenerative changes of lumbar spine are seen. IMPRESSION: Right lateral decubitus ulcer with subcutaneous inflammatory changes extending towards the greater trochanter of the right proximal femur. No definitive bony destructive changes are seen. Considerable subcutaneous inflammatory changes noted with air tracking towards  the lateral aspect of the femur. Changes of mild constipation. Cholelithiasis without complicating factors. Electronically Signed   By: Inez Catalina M.D.   On: 10/21/2019 16:51   US RENAL  Result Date: 10/18/2019 CLINICAL DATA:  Acute renal insufficiency. EXAM: RENAL / URINARY TRACT ULTRASOUND COMPLETE COMPARISON:  10/30/2019 CT FINDINGS: Right Kidney: Renal measurements: 9.3 x 4.4 x 5.8 cm = volume: 123 mL. Increased renal echogenicity. No hydronephrosis. Left Kidney: Renal measurements: 9.6 x 5.2 x 4.6 cm = volume: 119 mL. Increased renal echogenicity. No hydronephrosis. Bladder: Decompressed. Other: Trace perinephric edema/fluid likely relates to renal insufficiency. Note is made of gallbladder sludge and stones including at 1.0 cm. IMPRESSION: 1. No hydronephrosis. Increased renal echogenicity, consistent with medical renal disease. 2. Cholelithiasis without acute cholecystitis. Electronically Signed   By: Abigail Miyamoto M.D.   On: 10/18/2019 11:51   DG Chest Port 1 View  Result Date: 10/22/2019 CLINICAL DATA:  Sepsis EXAM: PORTABLE CHEST 1 VIEW COMPARISON:  11/05/2014 FINDINGS: Stable positioning of a left-sided implanted cardiac device. Post CABG changes. Stable cardiomediastinal contours. No focal airspace consolidation, pleural effusion, or pneumothorax. IMPRESSION: No acute cardiopulmonary findings. Electronically Signed   By: Davina Poke D.O.   On: 10/09/2019 14:47   [place] EKG SITE RITE  Result Date: 10/20/2019 If Site Rite image not attached, placement could not be confirmed due to current cardiac rhythm.   CBC Recent Labs  Lab 10/31/19 0449 11/02/19 0505 11/04/19 0518 11/06/19 0435 11/06/19 0845  WBC 5.6 5.5 5.4 6.8 5.8  HGB 7.7* 8.1* 7.8* 7.6* 7.5*  HCT 25.2* 25.6* 25.7* 25.1* 24.4*  PLT 18* 19* 28* 33* 32*  MCV 94.0 94.5 94.5 94.4 95.3  MCH 28.7 29.9 28.7 28.6 29.3  MCHC 30.6 31.6 30.4 30.3 30.7  RDW 17.6* 17.9* 17.8* 17.7* 17.6*    Chemistries  Recent Labs  Lab  10/31/19 0449 11/02/19 0505 11/04/19 0518 11/06/19 0435 11/06/19 0845  NA 142 142 141 138 138  K 3.0* 3.4* 3.9 3.8 3.9  CL 118* 117* 117* 116* 115*  CO2 17* 17* 18* 17* 17*  GLUCOSE 81 98 96 118* 132*  BUN 36* 31* 27* 25* 25*  CREATININE 1.38* 1.30* 1.23* 1.32* 1.32*  CALCIUM 7.9* 7.8* 7.7* 7.7* 7.6*   ------------------------------------------------------------------------------------------------------------------ No results for input(s): CHOL, HDL, LDLCALC, TRIG, CHOLHDL, LDLDIRECT in the last 72 hours.  Lab Results  Component Value Date   HGBA1C 5.5 10/19/2019   ------------------------------------------------------------------------------------------------------------------ No results for input(s): TSH, T4TOTAL, T3FREE, THYROIDAB in the last 72 hours.  Invalid input(s): FREET3 ------------------------------------------------------------------------------------------------------------------ No results for input(s): VITAMINB12, FOLATE, FERRITIN, TIBC, IRON, RETICCTPCT in the last 72 hours.  Coagulation profile No results for input(s): INR, PROTIME in the last  168 hours.  No results for input(s): DDIMER in the last 72 hours.  Cardiac Enzymes No results for input(s): CKMB, TROPONINI, MYOGLOBIN in the last 168 hours.  Invalid input(s): CK     Component Value Date/Time   BNP 583.7 (H) 11/01/2014 0600   Roxan Hockey M.D on 11/06/2019 at 7:37 PM  Go to [email or web address] - for contact info  [facility] - Office  [phone]

## 2019-11-06 NOTE — Progress Notes (Signed)
Palliative:  HPI: 85 yo female with PMH significant for Alzheimer's dementia, bedridden, CAD, systolic CHF (EF 70-48%), torsades s/p [employer] AICD, hypertension, diabetes admitted 10/25/2019 with decubitus ulcers and poor oral intake with sepsis UTI/wound infection/potential bacteremia, acute kidney injury, anemia and overall failure to thrive with poor intake. Per notes family have decided against feeding tube and do not desire hospice. Hypothermia has resolved and thrombocytopenia is slowly improving.Overall adult failure to thrive with underlying illness and poor recovery from acute illness.   I met again today at [name]'s bedside. Her husband is present. I explained that we are in a difficult place as we have fixed everything that is rever sable and her infection is cleared and temperature is stable. Unfortunately she is not improving to be able to be alert enough to eat/drink to sustain herself and is stuck on IVF. Thankfully she is also not declining further at the same time.   Husband confirmed family's desire to continue IVF and life prolonging measures. PEG may need to be reconsidered if long term artificial hydration is desired but I did not bring this up today as her platelets remain too low anyway.   All questions/concerns addressed. Emotional support provided. Discussed with Dr. Denton Brick.   Plan: - No changes in goals of care.  - If artificial means continue to be required to prolong life I recommend reconsideration of PEG.   15 min  Vinie Sill, NP Palliative Medicine Team Pager [phone] (Please see [email or web address] for schedule) Team Phone [phone]    Greater than 50%  of this time was spent counseling and coordinating care related to the above assessment and plan

## 2019-11-07 LAB — GLUCOSE, CAPILLARY
Glucose-Capillary: 113 mg/dL — ABNORMAL HIGH (ref 70–99)
Glucose-Capillary: 114 mg/dL — ABNORMAL HIGH (ref 70–99)
Glucose-Capillary: 117 mg/dL — ABNORMAL HIGH (ref 70–99)
Glucose-Capillary: 84 mg/dL (ref 70–99)
Glucose-Capillary: 86 mg/dL (ref 70–99)
Glucose-Capillary: 97 mg/dL (ref 70–99)
Glucose-Capillary: 99 mg/dL (ref 70–99)

## 2019-11-07 MED ORDER — CYANOCOBALAMIN 1000 MCG/ML IJ SOLN
1000.0000 ug | Freq: Once | INTRAMUSCULAR | Status: AC
  Administered 2019-11-07: 1000 ug via INTRAMUSCULAR
  Filled 2019-11-07: qty 1

## 2019-11-07 NOTE — Progress Notes (Signed)
Patient Demographics:    [name], is a 85 y.o. female, DOB - [birth year], TJL:[id number]  Admit date - 11/07/2019   Admitting Physician Ejiroghene Arlyce Dice, MD  Outpatient Primary MD for the patient is [name], MD  LOS - 30  Chief Complaint  Patient presents with  . Skin Ulcer       Subjective:    Discussed with Elmo Putt from palliative care -On IV dextrose solution due to hypoglycemia risk as a result of lack of oral intake --If platelet count gets above 50,000 may be a candidate for PEG placement  Assessment  & Plan :    Principal Problem:   Severe sepsis with septic shock - Klebsiella/Proteus/E. coli/Bacteroides and Staph  Active Problems:   Hypertension   Type II diabetes mellitus (HCC)   Mobitz type 2 second degree atrioventricular block   S/P ICD (internal cardiac defibrillator) procedure, with upgrade to ICD/CRT-D  Medtronic   Cardiomyopathy, ischemic EF 20 to 25 %/history of combined systolic and diastolic dysfunction CHF   Polymorphic ventricular tachycardia (HCC)   Sepsis ([employer])   Decubitus ulcer of right hip, stage 4 /Infected with E-coli and Proteus   Failure to thrive in adult   Stage I pressure ulcer of sacral region   Decubitus ulcer of left hip, stage 3/Infected with E-coli and Proteus   Bacteremia due to Gram-negative bacteria   Gram negative sepsis (HCC)   AKI (acute kidney injury) ([place])   Goals of care, counseling/discussion   Palliative care by specialist   Encounter for hospice care discussion   Hematochezia   Sepsis secondary to Klebsiella UTI ([place])   Anemia due to acute blood loss/rectal bleeding   Thrombocytopenia Acquired   Brief Summary 85 year old female with a history of advanced Alzheimer's dementia, CAD, H/o combined systolic and diastolic dysfunction CHF/HFrEF/ischemic cardiomyopathy with EF 20 to 25%, h/o torsades VT status post AICD, H/o HTN, DM2,  HLD, admitted on 10/23/2019 from home with metabolic encephalopathy in the setting of presumed infection (leukocytosis and Lactic acid was 3.1) with multiple decubitus ulcers -Weaned off IV Levophed on 10/22/19 -Requiring warming blanket from time to time ---Patient still unable to have oral intake due to advanced cognitive deficits--- she will spit food out, clenching her jaw and resist any attempt to feed her  --Pt has been made NPO for now as she is not awake or alert enough to safely feed by mouth. Husband has requested permission to be able to feed her by mouth when he is here visiting----Husband accepts aspiration risk -overall prognosis remains poor due to inability to take oral intake --On IV dextrose solution due to hypoglycemia risk as a result of lack of oral intake -If platelet count gets above 50,000 may be a candidate for PEG placement  Assessment/Plan: 1)Severe sepsis with septic shock-- POA -Etiology is multifactorial:-Patient has Klebsiella UTI (urine cx 10/17/19), E. coli and Proteus wound decubitus ulcer/wound infection (wound cx from 11/01/2019 and 10/17/19) and staph epi and Bacteroides bacteremia from blood culture dated 10/14/2019 -Weaned off IV Levophed on 10/22/19 --Stopped vancomycin after 10/21/2019 -MRSA PCR negative on 10/20/2019 -Repeat blood culture from 10/21/2019 NGTD -Patient remains encephalopathic, at baseline she does have advanced dementia  with significant cognitive and memory deficits -WBC is down to 5.4  from a peak of 25.6 -PCT was  2.91, ESR 60, CRP 14.1 - overall prognosis remains poor especially given lack of oral intake -Antimicrobials/Anti-fungal this admission: - Diflucan 3/16 and 3/17  -Ceftriaxone 10/22/2019 through 10/28/2019 inclusive  --metronidazole -10/21/2019 through 10/26/2019 inclusive  --zosyn x 1 on 10/28/2019 -- cefepime 10/23/2019 through 10/22/19 -Vanc --10/18/2019 through 10/22/19 -Eraxis -10/25/2019 through 10/28/2019 inclusive --No further episodes of  hypothermia requiring warming blanket from time to time--hypothermia resolved at this time  2)Bacteremia--B.fragilis and staph epi--- ???  Significance of blood culture from 10/19/2019 -??  Contaminant, however patient was septic --Sepsis pathophysiology and Antibiotics as above #1 -Repeat blood cultures on 10/21/2019 NGTD - 3)Infected Bilateral Hip and Sacral decubitus ulcers--please see photos in epic -10/26/2019 CT abdomen/pelvis--right lateral decubitus ulcer with subcutaneous inflammatory changes extending toward the greater trochanter. There is no bony destructive changes. There is considerable inflammatory change with air tracking toward the lateral aspect of the femur. -Since wounds are bilateral, is difficult to position patient so the pressure is offloaded from wounds -General surgery consultappreciated -10/17/19--bedside debridement -Wound cultures from 10/14/2019 and 10/17/2019 with E. coli and Proteus --Wound culture with fungus reluctant to use Diflucan due to prolonged QT concerns---okay to treat with anidulafungin -Sepsis pathophysiology and completed antibiotics as above #1  4) Klebsiella UTI--urine culture from 10/17/2019 noted --Sepsis pathophysiology and completed antibiotics as above #1  5)Failure to thrive -Serum B12--199-->supplement -TSH--37.364, Free T4--0.28 -Folate--7.2 -PT evaluation-->SNF -continues to have poor po intake -Cognitive status and encephalopathy makes it difficult to feed orally ---Patient still unable to have oral intake due to advanced cognitive deficits--- she will spit food out, clenching her jaw and resist any attempt to feed her -Continue judicious IV dextrose fluids given that patient's EF is down to 20 to 25% -Borderline blood glucose despite IV dextrose infusion --Remains very challenging to feed patient as she closes her mouth , clenching her jaw and spits out anything ---Pt has been made NPO for now as she is not awake or alert enough to  safely feed by mouth. Husband has requested permission to be able to feed her by mouth when he is here visiting--Husband accepts aspiration risk -Since she is unable to get any nutrition by mouth and family wishes to continue with aggressive care, I have offered PEG tube placement. Husband will discuss with family.  6)Symptomatic acute on chronic blood loss anemia due to Rectal bleeding-hemoglobin is up  to 7.8 from 6.9 after transfusion of 2 units of PRBC on 10/21/2019 hypotension resolved -No further rectal bleeding -Protonix for GI prophylaxis GI consult appreciated suspect rectal injury from fecal impaction--suppositories as ordered -Low platelets noted (33K) ,  no bleeding noted Suspect thrombocytopenia and anemia of critical illness / - infection and malnutrition related. -Consider transfusion if family desires if platelet count below 10K or if bleeding occurs  7)CKD stage 3b -initially felt to be AKI but now more likely progression of CKD -pt likely has progression of underlying CKD -Creatinine hovering below 2 -No recent renal labs available to compare current values to -renal US-->medical renal disease -Creatinine is down to 1.3 from a peak of 1.83 - renally adjust medications, avoid nephrotoxic agents / dehydration  / hypotension -Very scant urine output in Foley bag -Foley was placed due to urinary retention  8)H/o combined systolic and diastolic dysfunction CHF/HFrEF/ischemic cardiomyopathy with EF 20 to 25%- -02/2015 echo EF 20-25%, G2DD -BP improved with iVF   - Since she is third spacing IV fluids and developing anasarca, will  give one dose of lasix  9)Hypernatremia/dehydration -Resolved with IV fluids, --On IV dextrose solution due to hypoglycemia risk as a result of lack of oral intake  10)Alzheimer's dementia/ambulatory dysfunction -Continue Namenda -spouse states pt screams frequently at home -At baseline patient has very advanced dementia with severe cognitive  and memory deficits, she has been bed-bound for the last 3 months PTA, requiring total care  11)Coronary artery disease -hx of CABG in 2009 (LIMA-LAD, SVG-ramus, SVG-OM). Hx of RCA stent 04/2012 -no chest pain presently Discontinued Aspirin and Plavix due to rectal bleeding with symptomatic Anemia -Continue statin -Coreg on hold due to hypotension  12)Social/Ethics--- overall prognosis is poor, patient is a DNR/DNI -Plan of care, goals of care discussed with patient's husband at bedside -Official palliative care consult appreciated -Family declined comfort care and hospice at this time -Very advanced dementia with significant cognitive and memory deficits--- patient is total care   13)Thrombocytopenia---  Platelets 206 >> 188>>> 117>> 74>> 48>>> 38>>32>>27>>24>>18>>19>>28>>32k.  -- Possibly related to underlying infectious process.  No signs of bleeding at this time.   --Consider transfusion (if family desires) if platelet count below 10K or if bleeding occurs  14)Ventricular Tachycardia/H/o Torsades -status post AICD placement - she has CRT-D device followed by EP - she underwent ICD upgrade of single chamber ICD to [employer] CRT-D  Disposition Plan: Patient From: Home D/C Place: Hospice at Home Vs [facility]idential [facility] Barriers: Not Clinically Stable--poor po intake, -Borderline blood glucose despite IV dextrose infusion --Remains very challenging to feed patient as she closes her mouth , clenching her jaw and spits out anything --Family declined comfort care and hospice at this time --Discussed possibility of PEG with husband who will discuss with family -Will also need continued conversations with palliative care around prognosis  Code Status : DNR  Procedures-- bedside decubitus ulcer debridement on 10/17/2019 -Right arm PICC line placement on 10/21/2019 -transfusion of 2 units of PRBC on 10/21/2019 -Bedside decubitus debridement by Dr. Constance Haw on 10/22/2019  Family  Communication:    Discussed with husband  Consults  : Palliative care and general surgery  DVT Prophylaxis  :  Teds and SCDs/rectal bleeding/low platelets  Lab Results  Component Value Date   PLT 32 (L) 11/06/2019   Inpatient Medications  Scheduled Meds: . Chlorhexidine Gluconate Cloth  6 each Topical Daily  . collagenase   Topical BID  . feeding supplement (ENSURE ENLIVE)  237 mL Oral TID  . hydrocortisone  25 mg Rectal BID  . levothyroxine  37.5 mcg Intravenous Q0600  . mouth rinse  15 mL Mouth Rinse BID  . pantoprazole (PROTONIX) IV  40 mg Intravenous Q24H  . polyethylene glycol  17 g Oral Daily  . sodium chloride flush  10-40 mL Intracatheter Q12H   Continuous Infusions: . sodium chloride 10 mL/hr at 10/21/19 2018  . dextrose 5 % and 0.45% NaCl 75 mL/hr at 11/07/19 1046   PRN Meds:.sodium chloride, acetaminophen **OR** acetaminophen, bisacodyl, fentaNYL (SUBLIMAZE) injection, LORazepam, ondansetron (ZOFRAN) IV, sodium chloride flush   Anti-infectives (From admission, onward)   Start     Dose/Rate Route Frequency Ordered Stop   10/25/19 1500  anidulafungin (ERAXIS) 100 mg in sodium chloride 0.9 % 100 mL IVPB     100 mg 78 mL/hr over 100 Minutes Intravenous Every 24 hours 10/24/19 1728 10/28/19 1803   10/24/19 1400  fluconazole (DIFLUCAN) IVPB 200 mg  Status:  Discontinued     200 mg 100 mL/hr over 60 Minutes Intravenous Every 24 hours  10/23/19 1144 10/24/19 1717   10/23/19 1400  fluconazole (DIFLUCAN) IVPB 400 mg     400 mg 100 mL/hr over 120 Minutes Intravenous  Once 10/23/19 1144 10/23/19 2302   10/22/19 2200  cefTRIAXone (ROCEPHIN) 2 g in sodium chloride 0.9 % 100 mL IVPB     2 g 200 mL/hr over 30 Minutes Intravenous Every 24 hours 10/22/19 1022 10/28/19 2259   10/17/19 2359  vancomycin (VANCOREADY) IVPB 1250 mg/250 mL     1,250 mg 166.7 mL/hr over 90 Minutes Intravenous Every 48 hours 11/02/2019 2022 10/22/19 2222   10/15/2019 2359  metroNIDAZOLE (FLAGYL) IVPB 500  mg  Status:  Discontinued     500 mg 100 mL/hr over 60 Minutes Intravenous Every 8 hours 11/01/2019 2047 10/26/19 1604   11/06/2019 2359  ceFEPIme (MAXIPIME) 2 g in sodium chloride 0.9 % 100 mL IVPB  Status:  Discontinued     2 g 200 mL/hr over 30 Minutes Intravenous Every 24 hours 10/29/2019 2012 10/22/19 1022   10/19/2019 1430  vancomycin (VANCOCIN) IVPB 1000 mg/200 mL premix     1,000 mg 200 mL/hr over 60 Minutes Intravenous  Once 11/05/2019 1428 11/04/2019 1713   11/07/2019 1430  piperacillin-tazobactam (ZOSYN) IVPB 3.375 g     3.375 g 100 mL/hr over 30 Minutes Intravenous  Once 10/13/2019 1428 10/31/2019 1607       Objective:   Vitals:   11/06/19 1225 11/06/19 2117 11/07/19 0504 11/07/19 1334  BP:  (!) 126/50 (!) 108/45 (!) 106/54  Pulse:  81 87 85  Resp:  '20 20 18  ' Temp:  (!) 97.5 F (36.4 C) (!) 97.2 F (36.2 C) (!) 97.5 F (36.4 C)  TempSrc:  Oral  Oral  SpO2: 96% 100% 100% 100%  Weight:      Height:        Wt Readings from Last 3 Encounters:  10/22/19 80.2 kg  06/13/19 78.5 kg  10/19/18 73.6 kg    Intake/Output Summary (Last 24 hours) at 11/07/2019 1805 Last data filed at 11/07/2019 1600 Gross per 24 hour  Intake 6047.38 ml  Output 450 ml  Net 5597.38 ml   Physical Exam  General exam: laying in bed, no distress Respiratory system: Clear to auscultation. Respiratory effort normal. Cardiovascular system:RRR. No murmurs, rubs, gallops. Gastrointestinal system: Abdomen is nondistended, soft and nontender. No organomegaly or masses felt. Normal bowel sounds heard. Central nervous system: briefly opens eyes to voice, but does not follow commands or answer qeustions Extremities: anasarca with weeping noted Skin: bilateral sacral and hip ulcers Psychiatry: nonverbal      Data Review:   Micro Results No results found for this or any previous visit (from the past 240 hour(s)).  Radiology Reports CT ABDOMEN PELVIS WO CONTRAST  Result Date: 10/28/2019 CLINICAL DATA:   Decubitus ulcers EXAM: CT ABDOMEN AND PELVIS WITHOUT CONTRAST TECHNIQUE: Multidetector CT imaging of the abdomen and pelvis was performed following the standard protocol without IV contrast. COMPARISON:  None. FINDINGS: Lower chest: No acute abnormality. Hepatobiliary: Scattered calcifications are noted within the liver. Gallbladder is well distended with multiple dependent gallstones. Pancreas: Unremarkable. No pancreatic ductal dilatation or surrounding inflammatory changes. Spleen: Normal in size without focal abnormality. Adrenals/Urinary Tract: Adrenal glands are within normal limits. Kidneys are well visualized bilaterally with renal vascular calcifications. No renal stones are seen. No obstructive changes are noted. The bladder is partially distended. Stomach/Bowel: The appendix is not well visualized although no inflammatory changes to suggest appendicitis are noted. Retained fecal material  is noted throughout the colon consistent with a mild degree of constipation without obstructive change. No small bowel abnormality is seen. Stomach is unremarkable. Vascular/Lymphatic: Aortic atherosclerosis. No enlarged abdominal or pelvic lymph nodes. Reproductive: Uterus and bilateral adnexa are unremarkable. Other: No abdominal wall hernia or abnormality. No abdominopelvic ascites. Musculoskeletal: Mild changes of anasarca are noted. Decubitus ulcer is noted laterally near the right hip with inflammatory changes extending from the skin towards the greater trochanter. This area measures approximately 7.6 x 4.3 cm. No definitive bony erosive changes to suggest osteomyelitis are noted at this time. No other definitive decubitus ulcer is seen. Degenerative changes of lumbar spine are seen. IMPRESSION: Right lateral decubitus ulcer with subcutaneous inflammatory changes extending towards the greater trochanter of the right proximal femur. No definitive bony destructive changes are seen. Considerable subcutaneous  inflammatory changes noted with air tracking towards the lateral aspect of the femur. Changes of mild constipation. Cholelithiasis without complicating factors. Electronically Signed   By: Inez Catalina M.D.   On: 11/07/2019 16:51   US RENAL  Result Date: 10/18/2019 CLINICAL DATA:  Acute renal insufficiency. EXAM: RENAL / URINARY TRACT ULTRASOUND COMPLETE COMPARISON:  10/22/2019 CT FINDINGS: Right Kidney: Renal measurements: 9.3 x 4.4 x 5.8 cm = volume: 123 mL. Increased renal echogenicity. No hydronephrosis. Left Kidney: Renal measurements: 9.6 x 5.2 x 4.6 cm = volume: 119 mL. Increased renal echogenicity. No hydronephrosis. Bladder: Decompressed. Other: Trace perinephric edema/fluid likely relates to renal insufficiency. Note is made of gallbladder sludge and stones including at 1.0 cm. IMPRESSION: 1. No hydronephrosis. Increased renal echogenicity, consistent with medical renal disease. 2. Cholelithiasis without acute cholecystitis. Electronically Signed   By: Abigail Miyamoto M.D.   On: 10/18/2019 11:51   DG Chest Port 1 View  Result Date: 10/24/2019 CLINICAL DATA:  Sepsis EXAM: PORTABLE CHEST 1 VIEW COMPARISON:  11/05/2014 FINDINGS: Stable positioning of a left-sided implanted cardiac device. Post CABG changes. Stable cardiomediastinal contours. No focal airspace consolidation, pleural effusion, or pneumothorax. IMPRESSION: No acute cardiopulmonary findings. Electronically Signed   By: Davina Poke D.O.   On: 10/08/2019 14:47   [place] EKG SITE RITE  Result Date: 10/20/2019 If Site Rite image not attached, placement could not be confirmed due to current cardiac rhythm.   CBC Recent Labs  Lab 11/02/19 0505 11/04/19 0518 11/06/19 0435 11/06/19 0845  WBC 5.5 5.4 6.8 5.8  HGB 8.1* 7.8* 7.6* 7.5*  HCT 25.6* 25.7* 25.1* 24.4*  PLT 19* 28* 33* 32*  MCV 94.5 94.5 94.4 95.3  MCH 29.9 28.7 28.6 29.3  MCHC 31.6 30.4 30.3 30.7  RDW 17.9* 17.8* 17.7* 17.6*    Chemistries  Recent Labs  Lab  11/02/19 0505 11/04/19 0518 11/06/19 0435 11/06/19 0845  NA 142 141 138 138  K 3.4* 3.9 3.8 3.9  CL 117* 117* 116* 115*  CO2 17* 18* 17* 17*  GLUCOSE 98 96 118* 132*  BUN 31* 27* 25* 25*  CREATININE 1.30* 1.23* 1.32* 1.32*  CALCIUM 7.8* 7.7* 7.7* 7.6*   ------------------------------------------------------------------------------------------------------------------ No results for input(s): CHOL, HDL, LDLCALC, TRIG, CHOLHDL, LDLDIRECT in the last 72 hours.  Lab Results  Component Value Date   HGBA1C 5.5 10/19/2019   ------------------------------------------------------------------------------------------------------------------ No results for input(s): TSH, T4TOTAL, T3FREE, THYROIDAB in the last 72 hours.  Invalid input(s): FREET3 ------------------------------------------------------------------------------------------------------------------ No results for input(s): VITAMINB12, FOLATE, FERRITIN, TIBC, IRON, RETICCTPCT in the last 72 hours.  Coagulation profile No results for input(s): INR, PROTIME in the last 168 hours.  No results for input(s):  DDIMER in the last 72 hours.  Cardiac Enzymes No results for input(s): CKMB, TROPONINI, MYOGLOBIN in the last 168 hours.  Invalid input(s): CK     Component Value Date/Time   BNP 583.7 (H) 11/01/2014 1975   Kathie Dike M.D on 11/07/2019 at 6:05 PM  Go to [email or web address] - for contact info  [facility] - Office  [phone]

## 2019-11-07 NOTE — TOC Progression Note (Signed)
Transition of Care [facility]) - Progression Note    Patient Details  Name: [name] MRN: [id number] Date of Birth: 18-May-1935  Transition of Care [facility]) CM/SW Contact  Annice Needy, LCSW Phone Number: 11/07/2019, 2:22 PM  Clinical Narrative:    Referral made to Lauren with LT[facility] per progresson rounds.   Expected Discharge Plan: Home w Home Health Services Barriers to Discharge: Family Issues, Continued Medical Work up  Expected Discharge Plan and Services Expected Discharge Plan: Home w Home Health Services   Discharge Planning Services: CM Consult   Living arrangements for the past 2 months: Single Family Home                           HH Arranged: RN, PT, Social Work [employer] Agency: [facility] Date [facility] Agency Contacted: 10/19/19 Time HH Agency Contacted: 1302 Representative spoke with at [facility] Agency: Faxed   Social Determinants of Health (SDOH) Interventions    Readmission Risk Interventions No flowsheet data found.

## 2019-11-07 NOTE — Plan of Care (Signed)

## 2019-11-07 NOTE — Progress Notes (Signed)
Palliative:  HPI: 85 yo female with PMH significant for Alzheimer's dementia, bedridden, CAD, systolic CHF (EF 98-42%), torsades s/p [employer] AICD, hypertension, diabetes admitted 10/23/2019 with decubitus ulcers and poor oral intake with sepsis UTI/wound infection/potential bacteremia, acute kidney injury, anemia and overall failure to thrive with poor intake. Per notes family have decided against feeding tube and do not desire hospice. Hypothermia has resolved and thrombocytopenia is slowly improving.Overall adult failure to thrive with underlying illness and poor recovery from acute illness.  I met today with [name]'s husband at bedside. He continues to confirm desire for continued interventions to prolong life. I explained that she is currently stable but not improving and the next step would be PEG if artificial hydration is desired long term and explained that this would not be recommended long term via IV. [name] will speak more with his children but we seems willing to proceed with pursuing PEG placement if this is the recommendation to continue care. I told him that I would let Dr. Roderic Palau know and that we will work towards PEG placement unless he tells [place] otherwise after speaking with his children.   All questions/concerns addressed. Emotional support provided. Discussed with Dr. Roderic Palau.   Exam: Opens eyes to voice and sometimes will speak 1 word answers but inconsistent. Unable to eat/drink. No distress. Sleeping comfortably.   Plan: - Recommend proceeding with PEG placement when platelets allow.  - Goals are clear for continued care unless they decline PEG placement.   15 min  Vinie Sill, NP Palliative Medicine Team Pager [phone] (Please see [email or web address] for schedule) Team Phone [phone]    Greater than 50%  of this time was spent counseling and coordinating care related to the above assessment and plan

## 2019-11-08 LAB — GLUCOSE, CAPILLARY
Glucose-Capillary: 86 mg/dL (ref 70–99)
Glucose-Capillary: 84 mg/dL (ref 70–99)
Glucose-Capillary: 80 mg/dL (ref 70–99)
Glucose-Capillary: 93 mg/dL (ref 70–99)
Glucose-Capillary: 78 mg/dL (ref 70–99)

## 2019-11-08 LAB — BASIC METABOLIC PANEL
Anion gap: 7 (ref 5–15)
BUN: 24 mg/dL — ABNORMAL HIGH (ref 8–23)
CO2: 16 mmol/L — ABNORMAL LOW (ref 22–32)
Calcium: 7.7 mg/dL — ABNORMAL LOW (ref 8.9–10.3)
Chloride: 113 mmol/L — ABNORMAL HIGH (ref 98–111)
Creatinine, Ser: 1.46 mg/dL — ABNORMAL HIGH (ref 0.44–1.00)
GFR calc Af Amer: 38 mL/min — ABNORMAL LOW (ref >60)
GFR calc non Af Amer: 33 mL/min — ABNORMAL LOW (ref >60)
Glucose, Bld: 96 mg/dL (ref 70–99)
Potassium: 3.8 mmol/L (ref 3.5–5.1)
Sodium: 136 mmol/L (ref 135–145)

## 2019-11-08 LAB — CBC
HCT: 24.2 % — ABNORMAL LOW (ref 36.0–46.0)
Hemoglobin: 7.4 g/dL — ABNORMAL LOW (ref 12.0–15.0)
MCH: 29.5 pg (ref 26.0–34.0)
MCHC: 30.6 g/dL (ref 30.0–36.0)
MCV: 96.4 fL (ref 80.0–100.0)
Platelets: 37 10*3/uL — ABNORMAL LOW (ref 150–400)
RBC: 2.51 MIL/uL — ABNORMAL LOW (ref 3.87–5.11)
RDW: 17.6 % — ABNORMAL HIGH (ref 11.5–15.5)
WBC: 4.3 10*3/uL (ref 4.0–10.5)
nRBC: 0 % (ref 0.0–0.2)

## 2019-11-08 NOTE — TOC Progression Note (Signed)
Transition of Care [facility]) - Progression Note    Patient Details  Name: [name] MRN: [id number] Date of Birth: [birth year]  Transition of Care [facility]) CM/SW Contact  Annice Needy, LCSW Phone Number: 11/08/2019, 4:33 PM  Clinical Narrative:    Per message from Lauren with [facility], patient is not appropriate for their hospital. Attending notified.   Expected Discharge Plan: Home w Home Health Services Barriers to Discharge: Family Issues, Continued Medical Work up  Expected Discharge Plan and Services Expected Discharge Plan: Home w Home Health Services   Discharge Planning Services: CM Consult   Living arrangements for the past 2 months: Single Family Home                           HH Arranged: RN, PT, Social Work [employer] Agency: [facility] Date [facility] Agency Contacted: 10/19/19 Time HH Agency Contacted: 1302 Representative spoke with at [facility] Agency: Faxed   Social Determinants of Health (SDOH) Interventions    Readmission Risk Interventions No flowsheet data found.

## 2019-11-08 NOTE — Progress Notes (Signed)
Patient Demographics:    [name], is a 85 y.o. female, DOB - [birth year], TEI:[id number]  Admit date - 10/21/2019   Admitting Physician Ejiroghene Arlyce Dice, MD  Outpatient Primary MD for the patient is [name], MD  LOS - 23  Chief Complaint  Patient presents with  . Skin Ulcer       Subjective:   Patient remains non verbal. No family in room today  Assessment  & Plan :    Principal Problem:   Severe sepsis with septic shock - Klebsiella/Proteus/E. coli/Bacteroides and Staph  Active Problems:   Hypertension   Type II diabetes mellitus (HCC)   Mobitz type 2 second degree atrioventricular block   S/P ICD (internal cardiac defibrillator) procedure, with upgrade to ICD/CRT-D  [employer]   Cardiomyopathy, ischemic EF 20 to 25 %/history of combined systolic and diastolic dysfunction CHF   Polymorphic ventricular tachycardia (HCC)   Sepsis ([place])   Decubitus ulcer of right hip, stage 4 /Infected with E-coli and Proteus   Failure to thrive in adult   Stage I pressure ulcer of sacral region   Decubitus ulcer of left hip, stage 3/Infected with E-coli and Proteus   Bacteremia due to Gram-negative bacteria   Gram negative sepsis (HCC)   AKI (acute kidney injury) ([place])   Goals of care, counseling/discussion   Palliative care by specialist   Encounter for hospice care discussion   Hematochezia   Sepsis secondary to Klebsiella UTI ([place])   Anemia due to acute blood loss/rectal bleeding   Thrombocytopenia Acquired   Brief Summary 85 year old female with a history of advanced Alzheimer's dementia, CAD, H/o combined systolic and diastolic dysfunction CHF/HFrEF/ischemic cardiomyopathy with EF 20 to 25%, h/o torsades VT status post AICD, H/o HTN, DM2, HLD, admitted on 10/17/2019 from home with metabolic encephalopathy in the setting of presumed infection (leukocytosis and Lactic acid was 3.1) with multiple  decubitus ulcers -Weaned off IV Levophed on 10/22/19 -Requiring warming blanket from time to time ---Patient still unable to have oral intake due to advanced cognitive deficits--- she will spit food out, clenching her jaw and resist any attempt to feed her  --Pt has been made NPO for now as she is not awake or alert enough to safely feed by mouth. Husband has requested permission to be able to feed her by mouth when he is here visiting----Husband accepts aspiration risk -overall prognosis remains poor due to inability to take oral intake --On IV dextrose solution due to hypoglycemia risk as a result of lack of oral intake -If platelet count gets above 50,000 may be a candidate for PEG placement  Assessment/Plan: 1)Severe sepsis with septic shock-- POA -Etiology is multifactorial:-Patient has Klebsiella UTI (urine cx 10/17/19), E. coli and Proteus wound decubitus ulcer/wound infection (wound cx from 10/26/2019 and 10/17/19) and staph epi and Bacteroides bacteremia from blood culture dated 11/01/2019 -Weaned off IV Levophed on 10/22/19 --Stopped vancomycin after 10/21/2019 -MRSA PCR negative on 10/20/2019 -Repeat blood culture from 10/21/2019 NGTD -Patient remains encephalopathic, at baseline she does have advanced dementia  with significant cognitive and memory deficits -WBC is down to 5.4  from a peak of 25.6 -PCT was  2.91, ESR 60, CRP 14.1 - overall prognosis remains poor especially given lack of oral intake -Antimicrobials/Anti-fungal this  admission: - Diflucan 3/16 and 3/17  -Ceftriaxone 10/22/2019 through 10/28/2019 inclusive  --metronidazole -10/22/2019 through 10/26/2019 inclusive  --zosyn x 1 on 10/29/2019 -- cefepime 11/07/2019 through 10/22/19 -Vanc --10/26/2019 through 10/22/19 -Eraxis -10/25/2019 through 10/28/2019 inclusive --No further episodes of hypothermia requiring warming blanket from time to time--hypothermia resolved at this time  2)Bacteremia--B.fragilis and staph epi--- ???  Significance of  blood culture from 10/11/2019 -??  Contaminant, however patient was septic --Sepsis pathophysiology and Antibiotics as above #1 -Repeat blood cultures on 10/21/2019 NGTD - 3)Infected Bilateral Hip and Sacral decubitus ulcers--please see photos in epic -10/08/2019 CT abdomen/pelvis--right lateral decubitus ulcer with subcutaneous inflammatory changes extending toward the greater trochanter. There is no bony destructive changes. There is considerable inflammatory change with air tracking toward the lateral aspect of the femur. -Since wounds are bilateral, is difficult to position patient so the pressure is offloaded from wounds -General surgery consultappreciated -10/17/19--bedside debridement -Wound cultures from 10/17/2019 and 10/17/2019 with E. coli and Proteus --Wound culture with fungus reluctant to use Diflucan due to prolonged QT concerns---okay to treat with anidulafungin -Sepsis pathophysiology and completed antibiotics as above #1  4) Klebsiella UTI--urine culture from 10/17/2019 noted --Sepsis pathophysiology and completed antibiotics as above #1  5)Failure to thrive -Serum B12--199-->supplement -TSH--37.364, Free T4--0.28 -Folate--7.2 -PT evaluation-->SNF -continues to have poor po intake -Cognitive status and encephalopathy makes it difficult to feed orally ---Patient still unable to have oral intake due to advanced cognitive deficits--- she will spit food out, clenching her jaw and resist any attempt to feed her -Continue judicious IV dextrose fluids given that patient's EF is down to 20 to 25% -Borderline blood glucose despite IV dextrose infusion --Remains very challenging to feed patient as she closes her mouth , clenching her jaw and spits out anything ---Pt has been made NPO for now as she is not awake or alert enough to safely feed by mouth. Husband has requested permission to be able to feed her by mouth when he is here visiting--Husband accepts aspiration risk -Since she  is unable to get any nutrition by mouth and family wishes to continue with aggressive care, I have offered PEG tube placement. Husband and family willing to discuss this further with General Surgery if she is a candidate  6)Symptomatic acute on chronic blood loss anemia due to Rectal bleeding-hemoglobin is up  to 7.8 from 6.9 after transfusion of 2 units of PRBC on 10/21/2019 hypotension resolved -No further rectal bleeding -Protonix for GI prophylaxis GI consult appreciated suspect rectal injury from fecal impaction--suppositories as ordered -Low platelets noted (33K) ,  no bleeding noted Suspect thrombocytopenia and anemia of critical illness / - infection and malnutrition related. -Consider transfusion if family desires if platelet count below 10K or if bleeding occurs  7)CKD stage 3b -initially felt to be AKI but now more likely progression of CKD -pt likely has progression of underlying CKD -Creatinine hovering below 2 -No recent renal labs available to compare current values to -renal US-->medical renal disease -Creatinine is down to 1.3 from a peak of 1.83 - renally adjust medications, avoid nephrotoxic agents / dehydration  / hypotension -Very scant urine output in Foley bag -Foley was placed due to urinary retention  8)H/o combined systolic and diastolic dysfunction CHF/HFrEF/ischemic cardiomyopathy with EF 20 to 25%- -02/2015 echo EF 20-25%, G2DD -BP improved with iVF   - Since she is third spacing IV fluids and developing anasarca, will give one dose of lasix  9)Hypernatremia/dehydration -Resolved with IV fluids, --On IV dextrose solution  due to hypoglycemia risk as a result of lack of oral intake  10)Alzheimer's dementia/ambulatory dysfunction -Continue Namenda -spouse states pt screams frequently at home -At baseline patient has very advanced dementia with severe cognitive and memory deficits, she has been bed-bound for the last 3 months PTA, requiring total  care  11)Coronary artery disease -hx of CABG in 2009 (LIMA-LAD, SVG-ramus, SVG-OM). Hx of RCA stent 04/2012 -no chest pain presently Discontinued Aspirin and Plavix due to rectal bleeding with symptomatic Anemia -Continue statin -Coreg on hold due to hypotension  12)Social/Ethics--- overall prognosis is poor, patient is a DNR/DNI -Plan of care, goals of care discussed with patient's husband at bedside -Official palliative care consult appreciated -Family declined comfort care and hospice at this time -Very advanced dementia with significant cognitive and memory deficits--- patient is total care   13)Thrombocytopenia---  Platelets 206 >> 188>>> 117>> 74>> 48>>> 38>>32>>27>>24>>18>>19>>28>>37k.  -- Possibly related to underlying infectious process.  No signs of bleeding at this time.   --Consider transfusion (if family desires) if platelet count below 10K or if bleeding occurs  14)Ventricular Tachycardia/H/o Torsades -status post AICD placement - she has CRT-D device followed by EP - she underwent ICD upgrade of single chamber ICD to [employer] CRT-D  Disposition Plan: Patient From: Home D/C Place: Hosp[facility] at Home [facility] Barriers: Not Clinically Stable--poor po intake, -Borderline blood glucose despite IV dextrose infusion --Remains very challenging to feed patient as she closes her mouth , clenching her jaw and spits out anything --Family declined comfort care and hospice at this time --Discussed possibility of PEG with husband and son who agree to discuss further with General Surgery if she is a candidate -Will also need continued conversations with palliative care around prognosis  Code Status : DNR  Procedures-- bedside decubitus ulcer debridement on 10/17/2019 -Right arm PICC line placement on 10/21/2019 -transfusion of 2 units of PRBC on 10/21/2019 -Bedside decubitus debridement by Dr. Constance Haw on 10/22/2019  Family Communication:    Discussed with husband  and son Necole Minassian over the phone  Consults  : Palliative care and general surgery  DVT Prophylaxis  :  Teds and SCDs/rectal bleeding/low platelets  Lab Results  Component Value Date   PLT 37 (L) 11/08/2019   Inpatient Medications  Scheduled Meds: . Chlorhexidine Gluconate Cloth  6 each Topical Daily  . collagenase   Topical BID  . feeding supplement (ENSURE ENLIVE)  237 mL Oral TID  . hydrocortisone  25 mg Rectal BID  . levothyroxine  37.5 mcg Intravenous Q0600  . mouth rinse  15 mL Mouth Rinse BID  . pantoprazole (PROTONIX) IV  40 mg Intravenous Q24H  . polyethylene glycol  17 g Oral Daily  . sodium chloride flush  10-40 mL Intracatheter Q12H   Continuous Infusions: . sodium chloride 10 mL/hr at 10/21/19 2018  . dextrose 5 % and 0.45% NaCl 75 mL/hr at 11/08/19 0053   PRN Meds:.sodium chloride, acetaminophen **OR** acetaminophen, bisacodyl, fentaNYL (SUBLIMAZE) injection, LORazepam, ondansetron (ZOFRAN) IV, sodium chloride flush   Anti-infectives (From admission, onward)   Start     Dose/Rate Route Frequency Ordered Stop   10/25/19 1500  anidulafungin (ERAXIS) 100 mg in sodium chloride 0.9 % 100 mL IVPB     100 mg 78 mL/hr over 100 Minutes Intravenous Every 24 hours 10/24/19 1728 10/28/19 1803   10/24/19 1400  fluconazole (DIFLUCAN) IVPB 200 mg  Status:  Discontinued     200 mg 100 mL/hr over 60 Minutes Intravenous Every  24 hours 10/23/19 1144 10/24/19 1717   10/23/19 1400  fluconazole (DIFLUCAN) IVPB 400 mg     400 mg 100 mL/hr over 120 Minutes Intravenous  Once 10/23/19 1144 10/23/19 2302   10/22/19 2200  cefTRIAXone (ROCEPHIN) 2 g in sodium chloride 0.9 % 100 mL IVPB     2 g 200 mL/hr over 30 Minutes Intravenous Every 24 hours 10/22/19 1022 10/28/19 2259   10/17/19 2359  vancomycin (VANCOREADY) IVPB 1250 mg/250 mL     1,250 mg 166.7 mL/hr over 90 Minutes Intravenous Every 48 hours 10/15/2019 2022 10/22/19 2222   10/23/2019 2359  metroNIDAZOLE (FLAGYL) IVPB 500 mg   Status:  Discontinued     500 mg 100 mL/hr over 60 Minutes Intravenous Every 8 hours 11/05/2019 2047 10/26/19 1604   10/13/2019 2359  ceFEPIme (MAXIPIME) 2 g in sodium chloride 0.9 % 100 mL IVPB  Status:  Discontinued     2 g 200 mL/hr over 30 Minutes Intravenous Every 24 hours 10/15/2019 2012 10/22/19 1022   10/17/2019 1430  vancomycin (VANCOCIN) IVPB 1000 mg/200 mL premix     1,000 mg 200 mL/hr over 60 Minutes Intravenous  Once 10/12/2019 1428 11/01/2019 1713   10/30/2019 1430  piperacillin-tazobactam (ZOSYN) IVPB 3.375 g     3.375 g 100 mL/hr over 30 Minutes Intravenous  Once 10/26/2019 1428 10/24/2019 1607       Objective:   Vitals:   11/07/19 2141 11/08/19 0553 11/08/19 0600 11/08/19 1433  BP:  (!) 106/52  (!) 103/51  Pulse:  85  88  Resp:  20  16  Temp: (!) 97.3 F (36.3 C) (!) 95.7 F (35.4 C) (!) 97.1 F (36.2 C) (!) 97.2 F (36.2 C)  TempSrc: Oral Axillary Oral Oral  SpO2:  100%  100%  Weight:      Height:        Wt Readings from Last 3 Encounters:  10/22/19 80.2 kg  06/13/19 78.5 kg  10/19/18 73.6 kg    Intake/Output Summary (Last 24 hours) at 11/08/2019 1829 Last data filed at 11/08/2019 1000 Gross per 24 hour  Intake 818.06 ml  Output 250 ml  Net 568.06 ml   Physical Exam  General exam: laying in bed, opens eyes, no distress Respiratory system: Clear to auscultation. Respiratory effort normal. Cardiovascular system:RRR. No murmurs, rubs, gallops. Gastrointestinal system: Abdomen is nondistended, soft and nontender. No organomegaly or masses felt. Normal bowel sounds heard. Central nervous system: No focal neurological deficits. Extremities: anasarca is present Skin: No rashes, lesions or ulcers Psychiatry: nonverbal        Data Review:   Micro Results No results found for this or any previous visit (from the past 240 hour(s)).  Radiology Reports CT ABDOMEN PELVIS WO CONTRAST  Result Date: 10/23/2019 CLINICAL DATA:  Decubitus ulcers EXAM: CT ABDOMEN AND PELVIS  WITHOUT CONTRAST TECHNIQUE: Multidetector CT imaging of the abdomen and pelvis was performed following the standard protocol without IV contrast. COMPARISON:  None. FINDINGS: Lower chest: No acute abnormality. Hepatobiliary: Scattered calcifications are noted within the liver. Gallbladder is well distended with multiple dependent gallstones. Pancreas: Unremarkable. No pancreatic ductal dilatation or surrounding inflammatory changes. Spleen: Normal in size without focal abnormality. Adrenals/Urinary Tract: Adrenal glands are within normal limits. Kidneys are well visualized bilaterally with renal vascular calcifications. No renal stones are seen. No obstructive changes are noted. The bladder is partially distended. Stomach/Bowel: The appendix is not well visualized although no inflammatory changes to suggest appendicitis are noted. Retained fecal material is  noted throughout the colon consistent with a mild degree of constipation without obstructive change. No small bowel abnormality is seen. Stomach is unremarkable. Vascular/Lymphatic: Aortic atherosclerosis. No enlarged abdominal or pelvic lymph nodes. Reproductive: Uterus and bilateral adnexa are unremarkable. Other: No abdominal wall hernia or abnormality. No abdominopelvic ascites. Musculoskeletal: Mild changes of anasarca are noted. Decubitus ulcer is noted laterally near the right hip with inflammatory changes extending from the skin towards the greater trochanter. This area measures approximately 7.6 x 4.3 cm. No definitive bony erosive changes to suggest osteomyelitis are noted at this time. No other definitive decubitus ulcer is seen. Degenerative changes of lumbar spine are seen. IMPRESSION: Right lateral decubitus ulcer with subcutaneous inflammatory changes extending towards the greater trochanter of the right proximal femur. No definitive bony destructive changes are seen. Considerable subcutaneous inflammatory changes noted with air tracking towards  the lateral aspect of the femur. Changes of mild constipation. Cholelithiasis without complicating factors. Electronically Signed   By: Inez Catalina M.D.   On: 10/24/2019 16:51   US RENAL  Result Date: 10/18/2019 CLINICAL DATA:  Acute renal insufficiency. EXAM: RENAL / URINARY TRACT ULTRASOUND COMPLETE COMPARISON:  10/23/2019 CT FINDINGS: Right Kidney: Renal measurements: 9.3 x 4.4 x 5.8 cm = volume: 123 mL. Increased renal echogenicity. No hydronephrosis. Left Kidney: Renal measurements: 9.6 x 5.2 x 4.6 cm = volume: 119 mL. Increased renal echogenicity. No hydronephrosis. Bladder: Decompressed. Other: Trace perinephric edema/fluid likely relates to renal insufficiency. Note is made of gallbladder sludge and stones including at 1.0 cm. IMPRESSION: 1. No hydronephrosis. Increased renal echogenicity, consistent with medical renal disease. 2. Cholelithiasis without acute cholecystitis. Electronically Signed   By: Abigail Miyamoto M.D.   On: 10/18/2019 11:51   DG Chest Port 1 View  Result Date: 10/15/2019 CLINICAL DATA:  Sepsis EXAM: PORTABLE CHEST 1 VIEW COMPARISON:  11/05/2014 FINDINGS: Stable positioning of a left-sided implanted cardiac device. Post CABG changes. Stable cardiomediastinal contours. No focal airspace consolidation, pleural effusion, or pneumothorax. IMPRESSION: No acute cardiopulmonary findings. Electronically Signed   By: Davina Poke D.O.   On: 10/26/2019 14:47   [place] EKG SITE RITE  Result Date: 10/20/2019 If Site Rite image not attached, placement could not be confirmed due to current cardiac rhythm.   CBC Recent Labs  Lab 11/02/19 0505 11/04/19 0518 11/06/19 0435 11/06/19 0845 11/08/19 0807  WBC 5.5 5.4 6.8 5.8 4.3  HGB 8.1* 7.8* 7.6* 7.5* 7.4*  HCT 25.6* 25.7* 25.1* 24.4* 24.2*  PLT 19* 28* 33* 32* 37*  MCV 94.5 94.5 94.4 95.3 96.4  MCH 29.9 28.7 28.6 29.3 29.5  MCHC 31.6 30.4 30.3 30.7 30.6  RDW 17.9* 17.8* 17.7* 17.6* 17.6*    Chemistries  Recent Labs  Lab  11/02/19 0505 11/04/19 0518 11/06/19 0435 11/06/19 0845 11/08/19 0807  NA 142 141 138 138 136  K 3.4* 3.9 3.8 3.9 3.8  CL 117* 117* 116* 115* 113*  CO2 17* 18* 17* 17* 16*  GLUCOSE 98 96 118* 132* 96  BUN 31* 27* 25* 25* 24*  CREATININE 1.30* 1.23* 1.32* 1.32* 1.46*  CALCIUM 7.8* 7.7* 7.7* 7.6* 7.7*   ------------------------------------------------------------------------------------------------------------------ No results for input(s): CHOL, HDL, LDLCALC, TRIG, CHOLHDL, LDLDIRECT in the last 72 hours.  Lab Results  Component Value Date   HGBA1C 5.5 10/19/2019   ------------------------------------------------------------------------------------------------------------------ No results for input(s): TSH, T4TOTAL, T3FREE, THYROIDAB in the last 72 hours.  Invalid input(s): FREET3 ------------------------------------------------------------------------------------------------------------------ No results for input(s): VITAMINB12, FOLATE, FERRITIN, TIBC, IRON, RETICCTPCT in the last 72 hours.  Coagulation profile No results for input(s): INR, PROTIME in the last 168 hours.  No results for input(s): DDIMER in the last 72 hours.  Cardiac Enzymes No results for input(s): CKMB, TROPONINI, MYOGLOBIN in the last 168 hours.  Invalid input(s): CK     Component Value Date/Time   BNP 583.7 (H) 11/01/2014 0370   Kathie Dike M.D on 11/08/2019 at 6:29 PM  Go to [email or web address] - for contact info  [facility] - Office  [phone]

## 2019-11-08 DEATH — deceased

## 2019-11-09 LAB — COMPREHENSIVE METABOLIC PANEL WITH GFR
ALT: 7 U/L (ref 0–44)
AST: 10 U/L — ABNORMAL LOW (ref 15–41)
Albumin: 1.6 g/dL — ABNORMAL LOW (ref 3.5–5.0)
Alkaline Phosphatase: 78 U/L (ref 38–126)
Anion gap: 5 (ref 5–15)
BUN: 23 mg/dL (ref 8–23)
CO2: 17 mmol/L — ABNORMAL LOW (ref 22–32)
Calcium: 7.5 mg/dL — ABNORMAL LOW (ref 8.9–10.3)
Chloride: 115 mmol/L — ABNORMAL HIGH (ref 98–111)
Creatinine, Ser: 1.42 mg/dL — ABNORMAL HIGH (ref 0.44–1.00)
GFR calc Af Amer: 40 mL/min — ABNORMAL LOW (ref >60)
GFR calc non Af Amer: 34 mL/min — ABNORMAL LOW (ref >60)
Glucose, Bld: 92 mg/dL (ref 70–99)
Potassium: 3.7 mmol/L (ref 3.5–5.1)
Sodium: 137 mmol/L (ref 135–145)
Total Bilirubin: 0.4 mg/dL (ref 0.3–1.2)
Total Protein: 4.2 g/dL — ABNORMAL LOW (ref 6.5–8.1)

## 2019-11-09 LAB — GLUCOSE, CAPILLARY
Glucose-Capillary: 101 mg/dL — ABNORMAL HIGH (ref 70–99)
Glucose-Capillary: 136 mg/dL — ABNORMAL HIGH (ref 70–99)
Glucose-Capillary: 338 mg/dL — ABNORMAL HIGH (ref 70–99)
Glucose-Capillary: 68 mg/dL — ABNORMAL LOW (ref 70–99)
Glucose-Capillary: 84 mg/dL (ref 70–99)
Glucose-Capillary: 84 mg/dL (ref 70–99)
Glucose-Capillary: 85 mg/dL (ref 70–99)
Glucose-Capillary: 88 mg/dL (ref 70–99)
Glucose-Capillary: 92 mg/dL (ref 70–99)
Glucose-Capillary: 98 mg/dL (ref 70–99)

## 2019-11-09 MED ORDER — DEXTROSE 50 % IV SOLN: 12.5000 g | INTRAVENOUS | Status: AC

## 2019-11-09 MED ORDER — DEXTROSE 10 % IV SOLN: INTRAVENOUS | Status: DC

## 2019-11-09 MED ORDER — DEXTROSE 50 % IV SOLN
1.0000 | Freq: Once | INTRAVENOUS | Status: AC
  Administered 2019-11-09: 50 mL via INTRAVENOUS
  Filled 2019-11-09: qty 50

## 2019-11-09 NOTE — Progress Notes (Signed)
Unable to get temp reading by monitor. Rectal temp 94.1. Notified MD on call. Order to apply warming blanket. Warming blanket applied at 2300. Temp at 0200 97.4 axillary. Will continue to monitor.

## 2019-11-09 NOTE — Progress Notes (Signed)
CBG of 338 @ 1208 was obtained through the [facility] line with D10 running.  The result was not consistent with other CBGs obtained.  The CBG was retaken from a finger.  The result of 98 was more consistent with other CBGs obtained.

## 2019-11-09 NOTE — Plan of Care (Signed)
  Problem: Education: Goal: Knowledge of General Education information will improve Description: Including pain rating scale, medication(s)/side effects and non-pharmacologic comfort measures Outcome: Not Progressing   Problem: Skin Integrity: Goal: Risk for impaired skin integrity will decrease Outcome: Not Progressing

## 2019-11-09 NOTE — Progress Notes (Signed)
Hypoglycemic Event  CBG: 68 @ 0720  Treatment: Provider notified.  One ampule of D50 given per order.   Symptoms: No symptoms noted.  Follow-up CBG: Time: 0814 (time was >15 minutes due to lack of access to glucometer)   CBG Result: 136  Possible Reasons for Event: Patient is NPO but is on Dextrose 5% 0.5NS @ 75 mL/hr.   Comments/MD notified: Dr. Kerry Hough was notified.  He ordered 1 ampule of D50.      Carole Civil

## 2019-11-09 NOTE — Progress Notes (Addendum)
Patient Demographics:    [name], is a 85 y.o. female, DOB - 1935-01-02, RDE:[id number]  Admit date - 11/07/2019   Admitting Physician Ejiroghene Arlyce Dice, MD  Outpatient Primary MD for the patient is [name], MD  LOS - 24  Chief Complaint  Patient presents with  . Skin Ulcer       Subjective:   Patient is nonverbal. Briefly opens eyes to voice but then falls back asleep  Assessment  & Plan :    Principal Problem:   Severe sepsis with septic shock - Klebsiella/Proteus/E. coli/Bacteroides and Staph  Active Problems:   Hypertension   Type II diabetes mellitus (HCC)   Mobitz type 2 second degree atrioventricular block   S/P ICD (internal cardiac defibrillator) procedure, with upgrade to ICD/CRT-D  [employer]   Cardiomyopathy, ischemic EF 20 to 25 %/history of combined systolic and diastolic dysfunction CHF   Polymorphic ventricular tachycardia (HCC)   Sepsis ([place])   Decubitus ulcer of right hip, stage 4 /Infected with E-coli and Proteus   Failure to thrive in adult   Stage I pressure ulcer of sacral region   Decubitus ulcer of left hip, stage 3/Infected with E-coli and Proteus   Bacteremia due to Gram-negative bacteria   Gram negative sepsis (HCC)   AKI (acute kidney injury) ([place])   Goals of care, counseling/discussion   Palliative care by specialist   Encounter for hospice care discussion   Hematochezia   Sepsis secondary to Klebsiella UTI ([facility])   Anemia due to acute blood loss/rectal bleeding   Thrombocytopenia Acquired   Brief Summary 85 year old female with a history of advanced Alzheimer's dementia, CAD, H/o combined systolic and diastolic dysfunction CHF/HFrEF/ischemic cardiomyopathy with EF 20 to 25%, h/o torsades VT status post AICD, H/o HTN, DM2, HLD, admitted on 11/07/2019 from home with metabolic encephalopathy in the setting of presumed infection (leukocytosis and Lactic acid  was 3.1) with multiple decubitus ulcers -Weaned off IV Levophed on 10/22/19 -Requiring warming blanket from time to time ---Patient still unable to have oral intake due to advanced cognitive deficits--- she will spit food out, clenching her jaw and resist any attempt to feed her  --Pt has been made NPO for now as she is not awake or alert enough to safely feed by mouth. Husband has requested permission to be able to feed her by mouth when he is here visiting----Husband accepts aspiration risk -overall prognosis remains poor due to inability to take oral intake --On IV dextrose solution due to hypoglycemia risk as a result of lack of oral intake -If platelet count gets above 50,000 may be a candidate for PEG placement  Assessment/Plan: 1)Severe sepsis with septic shock-- POA -Etiology is multifactorial:-Patient has Klebsiella UTI (urine cx 10/17/19), E. coli and Proteus wound decubitus ulcer/wound infection (wound cx from 10/10/2019 and 10/17/19) and staph epi and Bacteroides bacteremia from blood culture dated 10/18/2019 -Weaned off IV Levophed on 10/22/19 --Stopped vancomycin after 10/21/2019 -MRSA PCR negative on 10/20/2019 -Repeat blood culture from 10/21/2019 NGTD -Patient remains encephalopathic, at baseline she does have advanced dementia  with significant cognitive and memory deficits -WBC is down to 5.4  from a peak of 25.6 -PCT was  2.91, ESR 60, CRP 14.1 - overall prognosis remains poor especially given lack of  oral intake -Antimicrobials/Anti-fungal this admission: - Diflucan 3/16 and 3/17  -Ceftriaxone 10/22/2019 through 10/28/2019 inclusive  --metronidazole -11/05/2019 through 10/26/2019 inclusive  --zosyn x 1 on 10/14/2019 -- cefepime 10/19/2019 through 10/22/19 -Vanc --10/21/2019 through 10/22/19 -Eraxis -10/25/2019 through 10/28/2019 inclusive --She continues to have episodes of hypothermia (likely related to hypoglycemia) needing warming blankets  2)Bacteremia--B.fragilis and staph epi--- ???   Significance of blood culture from 11/01/2019 -??  Contaminant, however patient was septic --Sepsis pathophysiology and Antibiotics as above #1 -Repeat blood cultures on 10/21/2019 NGTD - 3)Infected Bilateral Hip and Sacral decubitus ulcers--please see photos in epic -11/04/2019 CT abdomen/pelvis--right lateral decubitus ulcer with subcutaneous inflammatory changes extending toward the greater trochanter. There is no bony destructive changes. There is considerable inflammatory change with air tracking toward the lateral aspect of the femur. -Since wounds are bilateral, is difficult to position patient so the pressure is offloaded from wounds -General surgery consultappreciated -10/17/19--bedside debridement -Wound cultures from 11/05/2019 and 10/17/2019 with E. coli and Proteus --Wound culture with fungus reluctant to use Diflucan due to prolonged QT concerns---okay to treat with anidulafungin -Sepsis pathophysiology and completed antibiotics as above #1  4) Klebsiella UTI--urine culture from 10/17/2019 noted --Sepsis pathophysiology and completed antibiotics as above #1  5)Failure to thrive -Serum B12--199-->supplement -TSH--37.364, Free T4--0.28 -Folate--7.2 -PT evaluation-->SNF -continues to have poor po intake -Cognitive status and encephalopathy makes it difficult to feed orally ---Patient still unable to have oral intake due to advanced cognitive deficits--- she will spit food out, clenching her jaw and resist any attempt to feed her -Continue judicious IV dextrose fluids given that patient's EF is down to 20 to 25% -Low blood glucose despite IV dextrose infusion --> she has been transitioned to D10 infusion -She is developing significant anasarca due to third spacing IV fluids --Remains very challenging to feed patient as she closes her mouth , clenching her jaw and spits out anything ---Pt has been made NPO for now as she is not awake or alert enough to safely feed by mouth. Husband  has requested permission to be able to feed her by mouth when he is here visiting--Husband accepts aspiration risk -Since she is unable to get any nutrition by mouth and family wishes to continue with aggressive care, I have offered PEG tube placement. Husband and family willing to discuss this further with General Surgery if she is a candidate. Discussed with Dr. Arnoldo Morale who will ask Dr. Constance Haw to see patient on monday, since she is familiar with patient. -Currently, it does not appear that she is stable to undergo any procedures  6)Symptomatic acute on chronic blood loss anemia due to Rectal bleeding-hemoglobin is up  to 7.8 from 6.9 after transfusion of 2 units of PRBC on 10/21/2019 hypotension resolved -No further rectal bleeding -Protonix for GI prophylaxis GI consult appreciated suspect rectal injury from fecal impaction--suppositories as ordered -Low platelets noted (33K) ,  no bleeding noted Suspect thrombocytopenia and anemia of critical illness / - infection and malnutrition related. -Consider transfusion if family desires if platelet count below 10K or if bleeding occurs  7)CKD stage 3b -initially felt to be AKI but now more likely progression of CKD -pt likely has progression of underlying CKD -Creatinine hovering below 2 -No recent renal labs available to compare current values to -renal US-->medical renal disease -Creatinine is down to 1.3 from a peak of 1.83 - renally adjust medications, avoid nephrotoxic agents / dehydration  / hypotension -Very scant urine output in Foley bag -Foley was placed due to  urinary retention  8)H/o combined systolic and diastolic dysfunction CHF/HFrEF/ischemic cardiomyopathy with EF 20 to 25%- -02/2015 echo EF 20-25%, G2DD -BP improved with iVF     9)Hypernatremia/dehydration -Resolved with IV fluids, --On IV dextrose solution due to hypoglycemia risk as a result of lack of oral intake  10)Alzheimer's dementia/ambulatory  dysfunction -Continue Namenda -spouse states pt screams frequently at home -At baseline patient has very advanced dementia with severe cognitive and memory deficits, she has been bed-bound for the last 3 months PTA, requiring total care  11)Coronary artery disease -hx of CABG in 2009 (LIMA-LAD, SVG-ramus, SVG-OM). Hx of RCA stent 04/2012 -no chest pain presently Discontinued Aspirin and Plavix due to rectal bleeding with symptomatic Anemia -Continue statin -Coreg on hold due to hypotension  12)Social/Ethics--- overall prognosis is poor, patient is a DNR/DNI -Plan of care, goals of care discussed with patient's husband at bedside -Official palliative care consult appreciated -Family declined comfort care and hospice at this time -Very advanced dementia with significant cognitive and memory deficits--- patient is total care   13)Thrombocytopenia---  Platelets 206 >> 188>>> 117>> 74>> 48>>> 38>>32>>27>>24>>18>>19>>28>>37k.  -- Possibly related to underlying infectious process.  No signs of bleeding at this time.   --Consider transfusion (if family desires) if platelet count below 10K or if bleeding occurs  14)Ventricular Tachycardia/H/o Torsades -status post AICD placement - she has CRT-D device followed by EP - she underwent ICD upgrade of single chamber ICD to [employer] CRT-D  Disposition Plan: Patient From: Home D/C Place: [facility] Barriers: Not Clinically Stable--poor po intake, -Borderline blood glucose despite IV dextrose infusion --Remains very challenging to feed patient as she closes her mouth , clenching her jaw and spits out anything --Family did not want to pursue comfort care and hospice at this time --with patient's persistent hypoglycemia/hypothermia despite receiving dextrose, I have broached the subject of comfort care again. -Family will discuss and see if they can align around this direction of care. Questions were answered. -Will  also need continued conversations with palliative care around prognosis  Code Status : DNR  Procedures-- bedside decubitus ulcer debridement on 10/17/2019 -Right arm PICC line placement on 10/21/2019 -transfusion of 2 units of PRBC on 10/21/2019 -Bedside decubitus debridement by Dr. Constance Haw on 10/22/2019  Family Communication:    Discussed with husband and son Analy Bassford at the bedside  Consults  : Palliative care and general surgery  DVT Prophylaxis  :  Teds and SCDs/rectal bleeding/low platelets  Lab Results  Component Value Date   PLT 37 (L) 11/08/2019   Inpatient Medications  Scheduled Meds: . Chlorhexidine Gluconate Cloth  6 each Topical Daily  . collagenase   Topical BID  . dextrose  12.5 g Intravenous STAT  . feeding supplement (ENSURE ENLIVE)  237 mL Oral TID  . hydrocortisone  25 mg Rectal BID  . levothyroxine  37.5 mcg Intravenous Q0600  . mouth rinse  15 mL Mouth Rinse BID  . pantoprazole (PROTONIX) IV  40 mg Intravenous Q24H  . polyethylene glycol  17 g Oral Daily  . sodium chloride flush  10-40 mL Intracatheter Q12H   Continuous Infusions: . sodium chloride 10 mL/hr at 10/21/19 2018  . dextrose 50 mL/hr at 11/09/19 1711   PRN Meds:.sodium chloride, acetaminophen **OR** acetaminophen, bisacodyl, fentaNYL (SUBLIMAZE) injection, LORazepam, ondansetron (ZOFRAN) IV, sodium chloride flush   Anti-infectives (From admission, onward)   Start     Dose/Rate Route Frequency Ordered Stop   10/25/19 1500  anidulafungin (  ERAXIS) 100 mg in sodium chloride 0.9 % 100 mL IVPB     100 mg 78 mL/hr over 100 Minutes Intravenous Every 24 hours 10/24/19 1728 10/28/19 1803   10/24/19 1400  fluconazole (DIFLUCAN) IVPB 200 mg  Status:  Discontinued     200 mg 100 mL/hr over 60 Minutes Intravenous Every 24 hours 10/23/19 1144 10/24/19 1717   10/23/19 1400  fluconazole (DIFLUCAN) IVPB 400 mg     400 mg 100 mL/hr over 120 Minutes Intravenous  Once 10/23/19 1144 10/23/19 2302    10/22/19 2200  cefTRIAXone (ROCEPHIN) 2 g in sodium chloride 0.9 % 100 mL IVPB     2 g 200 mL/hr over 30 Minutes Intravenous Every 24 hours 10/22/19 1022 10/28/19 2259   10/17/19 2359  vancomycin (VANCOREADY) IVPB 1250 mg/250 mL     1,250 mg 166.7 mL/hr over 90 Minutes Intravenous Every 48 hours 11/01/2019 2022 10/22/19 2222   10/30/2019 2359  metroNIDAZOLE (FLAGYL) IVPB 500 mg  Status:  Discontinued     500 mg 100 mL/hr over 60 Minutes Intravenous Every 8 hours 10/09/2019 2047 10/26/19 1604   10/28/2019 2359  ceFEPIme (MAXIPIME) 2 g in sodium chloride 0.9 % 100 mL IVPB  Status:  Discontinued     2 g 200 mL/hr over 30 Minutes Intravenous Every 24 hours 10/14/2019 2012 10/22/19 1022   10/10/2019 1430  vancomycin (VANCOCIN) IVPB 1000 mg/200 mL premix     1,000 mg 200 mL/hr over 60 Minutes Intravenous  Once 11/07/2019 1428 10/20/2019 1713   10/15/2019 1430  piperacillin-tazobactam (ZOSYN) IVPB 3.375 g     3.375 g 100 mL/hr over 30 Minutes Intravenous  Once 10/12/2019 1428 10/23/2019 1607       Objective:   Vitals:   11/09/19 0551 11/09/19 0743 11/09/19 1221 11/09/19 1412  BP: (!) 99/59   (!) 90/56  Pulse: 90   87  Resp:    18  Temp:   (!) 95.3 F (35.2 C) 97.6 F (36.4 C)  TempSrc:   Axillary Oral  SpO2:  98%  100%  Weight:      Height:        Wt Readings from Last 3 Encounters:  10/22/19 80.2 kg  06/13/19 78.5 kg  10/19/18 73.6 kg    Intake/Output Summary (Last 24 hours) at 11/09/2019 1732 Last data filed at 11/09/2019 1711 Gross per 24 hour  Intake 318.86 ml  Output 200 ml  Net 118.86 ml   Physical Exam  General exam: opens eyes to voice but then falls back asleep Respiratory system: diminished breath sounds at bases. Respiratory effort normal. Cardiovascular system:RRR. No murmurs, rubs, gallops. Gastrointestinal system: Abdomen is nondistended, soft and nontender. No organomegaly or masses felt. Normal bowel sounds heard. Central nervous system:  No focal neurological  deficits. Extremities: anasarca Skin: No rashes, lesions or ulcers Psychiatry: nonverbal         Data Review:   Micro Results No results found for this or any previous visit (from the past 240 hour(s)).  Radiology Reports CT ABDOMEN PELVIS WO CONTRAST  Result Date: 11/02/2019 CLINICAL DATA:  Decubitus ulcers EXAM: CT ABDOMEN AND PELVIS WITHOUT CONTRAST TECHNIQUE: Multidetector CT imaging of the abdomen and pelvis was performed following the standard protocol without IV contrast. COMPARISON:  None. FINDINGS: Lower chest: No acute abnormality. Hepatobiliary: Scattered calcifications are noted within the liver. Gallbladder is well distended with multiple dependent gallstones. Pancreas: Unremarkable. No pancreatic ductal dilatation or surrounding inflammatory changes. Spleen: Normal in size without focal abnormality.  Adrenals/Urinary Tract: Adrenal glands are within normal limits. Kidneys are well visualized bilaterally with renal vascular calcifications. No renal stones are seen. No obstructive changes are noted. The bladder is partially distended. Stomach/Bowel: The appendix is not well visualized although no inflammatory changes to suggest appendicitis are noted. Retained fecal material is noted throughout the colon consistent with a mild degree of constipation without obstructive change. No small bowel abnormality is seen. Stomach is unremarkable. Vascular/Lymphatic: Aortic atherosclerosis. No enlarged abdominal or pelvic lymph nodes. Reproductive: Uterus and bilateral adnexa are unremarkable. Other: No abdominal wall hernia or abnormality. No abdominopelvic ascites. Musculoskeletal: Mild changes of anasarca are noted. Decubitus ulcer is noted laterally near the right hip with inflammatory changes extending from the skin towards the greater trochanter. This area measures approximately 7.6 x 4.3 cm. No definitive bony erosive changes to suggest osteomyelitis are noted at this time. No other definitive  decubitus ulcer is seen. Degenerative changes of lumbar spine are seen. IMPRESSION: Right lateral decubitus ulcer with subcutaneous inflammatory changes extending towards the greater trochanter of the right proximal femur. No definitive bony destructive changes are seen. Considerable subcutaneous inflammatory changes noted with air tracking towards the lateral aspect of the femur. Changes of mild constipation. Cholelithiasis without complicating factors. Electronically Signed   By: Inez Catalina M.D.   On: 10/15/2019 16:51   US RENAL  Result Date: 10/18/2019 CLINICAL DATA:  Acute renal insufficiency. EXAM: RENAL / URINARY TRACT ULTRASOUND COMPLETE COMPARISON:  11/05/2019 CT FINDINGS: Right Kidney: Renal measurements: 9.3 x 4.4 x 5.8 cm = volume: 123 mL. Increased renal echogenicity. No hydronephrosis. Left Kidney: Renal measurements: 9.6 x 5.2 x 4.6 cm = volume: 119 mL. Increased renal echogenicity. No hydronephrosis. Bladder: Decompressed. Other: Trace perinephric edema/fluid likely relates to renal insufficiency. Note is made of gallbladder sludge and stones including at 1.0 cm. IMPRESSION: 1. No hydronephrosis. Increased renal echogenicity, consistent with medical renal disease. 2. Cholelithiasis without acute cholecystitis. Electronically Signed   By: Abigail Miyamoto M.D.   On: 10/18/2019 11:51   DG Chest Port 1 View  Result Date: 11/03/2019 CLINICAL DATA:  Sepsis EXAM: PORTABLE CHEST 1 VIEW COMPARISON:  11/05/2014 FINDINGS: Stable positioning of a left-sided implanted cardiac device. Post CABG changes. Stable cardiomediastinal contours. No focal airspace consolidation, pleural effusion, or pneumothorax. IMPRESSION: No acute cardiopulmonary findings. Electronically Signed   By: Davina Poke D.O.   On: 10/10/2019 14:47   [place] EKG SITE RITE  Result Date: 10/20/2019 If Site Rite image not attached, placement could not be confirmed due to current cardiac rhythm.   CBC Recent Labs  Lab 11/04/19 0518  11/06/19 0435 11/06/19 0845 11/08/19 0807  WBC 5.4 6.8 5.8 4.3  HGB 7.8* 7.6* 7.5* 7.4*  HCT 25.7* 25.1* 24.4* 24.2*  PLT 28* 33* 32* 37*  MCV 94.5 94.4 95.3 96.4  MCH 28.7 28.6 29.3 29.5  MCHC 30.4 30.3 30.7 30.6  RDW 17.8* 17.7* 17.6* 17.6*    Chemistries  Recent Labs  Lab 11/04/19 0518 11/06/19 0435 11/06/19 0845 11/08/19 0807 11/09/19 0439  NA 141 138 138 136 137  K 3.9 3.8 3.9 3.8 3.7  CL 117* 116* 115* 113* 115*  CO2 18* 17* 17* 16* 17*  GLUCOSE 96 118* 132* 96 92  BUN 27* 25* 25* 24* 23  CREATININE 1.23* 1.32* 1.32* 1.46* 1.42*  CALCIUM 7.7* 7.7* 7.6* 7.7* 7.5*  AST  --   --   --   --  10*  ALT  --   --   --   --  7  ALKPHOS  --   --   --   --  78  BILITOT  --   --   --   --  0.4   ------------------------------------------------------------------------------------------------------------------ No results for input(s): CHOL, HDL, LDLCALC, TRIG, CHOLHDL, LDLDIRECT in the last 72 hours.  Lab Results  Component Value Date   HGBA1C 5.5 10/19/2019   ------------------------------------------------------------------------------------------------------------------ No results for input(s): TSH, T4TOTAL, T3FREE, THYROIDAB in the last 72 hours.  Invalid input(s): FREET3 ------------------------------------------------------------------------------------------------------------------ No results for input(s): VITAMINB12, FOLATE, FERRITIN, TIBC, IRON, RETICCTPCT in the last 72 hours.  Coagulation profile No results for input(s): INR, PROTIME in the last 168 hours.  No results for input(s): DDIMER in the last 72 hours.  Cardiac Enzymes No results for input(s): CKMB, TROPONINI, MYOGLOBIN in the last 168 hours.  Invalid input(s): CK     Component Value Date/Time   BNP 583.7 (H) 11/01/2014 3888   Kathie Dike M.D on 11/09/2019 at 5:32 PM  Go to [email or web address] - for contact info  [facility] - Office  [phone]

## 2019-11-10 DIAGNOSIS — I255 Ischemic cardiomyopathy: Secondary | ICD-10-CM

## 2019-11-10 LAB — CBC
HCT: 21.5 % — ABNORMAL LOW (ref 36.0–46.0)
Hemoglobin: 6.7 g/dL — CL (ref 12.0–15.0)
MCH: 29.4 pg (ref 26.0–34.0)
MCHC: 31.2 g/dL (ref 30.0–36.0)
MCV: 94.3 fL (ref 80.0–100.0)
Platelets: 38 10*3/uL — ABNORMAL LOW (ref 150–400)
RBC: 2.28 MIL/uL — ABNORMAL LOW (ref 3.87–5.11)
RDW: 17.3 % — ABNORMAL HIGH (ref 11.5–15.5)
WBC: 2.9 10*3/uL — ABNORMAL LOW (ref 4.0–10.5)
nRBC: 1 % — ABNORMAL HIGH (ref 0.0–0.2)

## 2019-11-10 LAB — BASIC METABOLIC PANEL
Anion gap: 5 (ref 5–15)
BUN: 23 mg/dL (ref 8–23)
CO2: 17 mmol/L — ABNORMAL LOW (ref 22–32)
Calcium: 7.6 mg/dL — ABNORMAL LOW (ref 8.9–10.3)
Chloride: 113 mmol/L — ABNORMAL HIGH (ref 98–111)
Creatinine, Ser: 1.56 mg/dL — ABNORMAL HIGH (ref 0.44–1.00)
GFR calc Af Amer: 35 mL/min — ABNORMAL LOW (ref >60)
GFR calc non Af Amer: 31 mL/min — ABNORMAL LOW (ref >60)
Glucose, Bld: 95 mg/dL (ref 70–99)
Potassium: 3.7 mmol/L (ref 3.5–5.1)
Sodium: 135 mmol/L (ref 135–145)

## 2019-11-10 LAB — GLUCOSE, CAPILLARY
Glucose-Capillary: 80 mg/dL (ref 70–99)
Glucose-Capillary: 81 mg/dL (ref 70–99)
Glucose-Capillary: 86 mg/dL (ref 70–99)
Glucose-Capillary: 83 mg/dL (ref 70–99)
Glucose-Capillary: 170 mg/dL — ABNORMAL HIGH (ref 70–99)

## 2019-11-10 LAB — PREPARE RBC (CROSSMATCH)

## 2019-11-10 MED ORDER — SODIUM CHLORIDE 0.9% IV SOLUTION: Freq: Once | INTRAVENOUS | Status: AC

## 2019-11-10 NOTE — Progress Notes (Signed)
Patient Demographics:    [name], is a 85 y.o. female, DOB - 07/06/1935, NGF:[id number]  Admit date - 10/25/2019   Admitting Physician Ejiroghene Arlyce Dice, MD  Outpatient Primary MD for the patient is [name], MD  LOS - 25  Chief Complaint  Patient presents with  . Skin Ulcer       Subjective:   Keeps eyes closed, does not follow commands or answer questiosn  Assessment  & Plan :    Principal Problem:   Severe sepsis with septic shock - Klebsiella/Proteus/E. coli/Bacteroides and Staph  Active Problems:   Hypertension   Type II diabetes mellitus (HCC)   Mobitz type 2 second degree atrioventricular block   S/P ICD (internal cardiac defibrillator) procedure, with upgrade to ICD/CRT-D  [employer]   Cardiomyopathy, ischemic EF 20 to 25 %/history of combined systolic and diastolic dysfunction CHF   Polymorphic ventricular tachycardia (HCC)   Sepsis (HCC)   Decubitus ulcer of right hip, stage 4 /Infected with E-coli and Proteus   Failure to thrive in adult   Stage I pressure ulcer of sacral region   Decubitus ulcer of left hip, stage 3/Infected with E-coli and Proteus   Bacteremia due to Gram-negative bacteria   Gram negative sepsis (HCC)   AKI (acute kidney injury) ([place])   Goals of care, counseling/discussion   Palliative care by specialist   Encounter for hospice care discussion   Hematochezia   Sepsis secondary to Klebsiella UTI ([place])   Anemia due to acute blood loss/rectal bleeding   Thrombocytopenia Acquired   Brief Summary 85 year old female with a history of advanced Alzheimer's dementia, CAD, H/o combined systolic and diastolic dysfunction CHF/HFrEF/ischemic cardiomyopathy with EF 20 to 25%, h/o torsades VT status post AICD, H/o HTN, DM2, HLD, admitted on 10/15/2019 from home with metabolic encephalopathy in the setting of presumed infection (leukocytosis and Lactic acid was 3.1) with  multiple decubitus ulcers -Weaned off IV Levophed on 10/22/19 -Requiring warming blanket from time to time ---Patient still unable to have oral intake due to advanced cognitive deficits--- she will spit food out, clenching her jaw and resist any attempt to feed her  --Pt has been made NPO for now as she is not awake or alert enough to safely feed by mouth. Husband has requested permission to be able to feed her by mouth when he is here visiting----Husband accepts aspiration risk -overall prognosis remains poor due to inability to take oral intake --On IV dextrose solution due to hypoglycemia risk as a result of lack of oral intake -If platelet count gets above 50,000 may be a candidate for PEG placement  Assessment/Plan: 1)Severe sepsis with septic shock-- POA -Etiology is multifactorial:-Patient has Klebsiella UTI (urine cx 10/17/19), E. coli and Proteus wound decubitus ulcer/wound infection (wound cx from 11/04/2019 and 10/17/19) and staph epi and Bacteroides bacteremia from blood culture dated 10/12/2019 -Weaned off IV Levophed on 10/22/19 --Stopped vancomycin after 10/21/2019 -MRSA PCR negative on 10/20/2019 -Repeat blood culture from 10/21/2019 NGTD -Patient remains encephalopathic, at baseline she does have advanced dementia  with significant cognitive and memory deficits -WBC is down to 5.4  from a peak of 25.6 -PCT was  2.91, ESR 60, CRP 14.1 - overall prognosis remains poor especially given lack of oral intake -Antimicrobials/Anti-fungal  this admission: - Diflucan 3/16 and 3/17  -Ceftriaxone 10/22/2019 through 10/28/2019 inclusive  --metronidazole -11/02/2019 through 10/26/2019 inclusive  --zosyn x 1 on 10/12/2019 -- cefepime 10/09/2019 through 10/22/19 -Vanc --10/12/2019 through 10/22/19 -Eraxis -10/25/2019 through 10/28/2019 inclusive  2)Bacteremia--B.fragilis and staph epi--- ???  Significance of blood culture from 10/24/2019 -??  Contaminant, however patient was septic --Sepsis pathophysiology and  Antibiotics as above #1 -Repeat blood cultures on 10/21/2019 NGTD - 3)Infected Bilateral Hip and Sacral decubitus ulcers--please see photos in epic -10/26/2019 CT abdomen/pelvis--right lateral decubitus ulcer with subcutaneous inflammatory changes extending toward the greater trochanter. There is no bony destructive changes. There is considerable inflammatory change with air tracking toward the lateral aspect of the femur. -Since wounds are bilateral, is difficult to position patient so the pressure is offloaded from wounds -General surgery consultappreciated -10/17/19--bedside debridement -Wound cultures from 10/31/2019 and 10/17/2019 with E. coli and Proteus --Wound culture with fungus reluctant to use Diflucan due to prolonged QT concerns---okay to treat with anidulafungin -Sepsis pathophysiology and completed antibiotics as above #1  4) Klebsiella UTI--urine culture from 10/17/2019 noted --Sepsis pathophysiology and completed antibiotics as above #1  5)Failure to thrive -Serum B12--199-->supplement -TSH--37.364, Free T4--0.28 -Folate--7.2 -PT evaluation-->SNF -continues to have poor po intake -Cognitive status and encephalopathy makes it difficult to feed orally ---Patient still unable to have oral intake due to advanced cognitive deficits--- she will spit food out, clenching her jaw and resist any attempt to feed her -Continue judicious IV dextrose fluids given that patient's EF is down to 20 to 25% -Low blood glucose despite IV dextrose infusion --> she has been transitioned to D10 infusion -She is developing significant anasarca due to third spacing IV fluids --Remains very challenging to feed patient as she closes her mouth , clenching her jaw and spits out anything ---Pt has been made NPO for now as she is not awake or alert enough to safely feed by mouth. Husband has requested permission to be able to feed her by mouth when he is here visiting--Husband accepts aspiration  risk -Since she is unable to get any nutrition by mouth and family wishes to continue with aggressive care, I have offered PEG tube placement. Husband and family willing to discuss this further with General Surgery if she is a candidate. Discussed with Dr. Arnoldo Morale who will ask Dr. Constance Haw to see patient on monday, since she is familiar with patient. -will update echocardiogram  6)Symptomatic acute on chronic blood loss anemia due to Rectal bleeding- transfusion of 2 units of PRBC on 10/21/2019 hypotension resolved -No further rectal bleeding -Protonix for GI prophylaxis GI consult appreciated suspect rectal injury from fecal impaction--suppositories as ordered -Hgb has trended down to 6.7 with no signs of bleeding. Suspect anemia of critical illness -transfuse 1 unit prbc  7)CKD stage 3b -initially felt to be AKI but now more likely progression of CKD -pt likely has progression of underlying CKD -Creatinine hovering below 2 -No recent renal labs available to compare current values to -renal US-->medical renal disease -Creatinine is down to 1.5 from a peak of 1.83 - renally adjust medications, avoid nephrotoxic agents / dehydration  / hypotension -Very scant urine output in Foley bag -Foley was placed due to urinary retention  8)H/o combined systolic and diastolic dysfunction CHF/HFrEF/ischemic cardiomyopathy with EF 20 to 25%- -02/2015 echo EF 20-25%, G2DD, echo will be updated -BP improved with iVF     9)Hypernatremia/dehydration -Resolved with IV fluids, --On IV dextrose solution due to hypoglycemia risk as a result of lack  of oral intake  10)Alzheimer's dementia/ambulatory dysfunction -Continue Namenda -spouse states pt screams frequently at home -At baseline patient has very advanced dementia with severe cognitive and memory deficits, she has been bed-bound for the last 3 months PTA, requiring total care  11)Coronary artery disease -hx of CABG in 2009 (LIMA-LAD,  SVG-ramus, SVG-OM). Hx of RCA stent 04/2012 -no chest pain presently Discontinued Aspirin and Plavix due to rectal bleeding with symptomatic Anemia -Continue statin -Coreg on hold due to hypotension  12)Social/Ethics--- overall prognosis is poor, patient is a DNR/DNI -Plan of care, goals of care discussed with patient's husband at bedside -Official palliative care consult appreciated -Family declined comfort care and hospice at this time -Very advanced dementia with significant cognitive and memory deficits--- patient is total care   13)Thrombocytopenia---  Platelets 206 >> 188>>> 117>> 74>> 48>>> 38>>32>>27>>24>>18>>19>>28>>38k.  -- Possibly related to underlying infectious process.  No signs of bleeding at this time.   --Consider transfusion (if family desires) if platelet count below 10K or if bleeding occurs  14)Ventricular Tachycardia/H/o Torsades -status post AICD placement - she has CRT-D device followed by EP - she underwent ICD upgrade of single chamber ICD to [employer] CRT-D  Disposition Plan: Patient From: Home D/C Place: [facility] at Home [facility] Barriers: Not Clinically Stable--poor po intake, -Borderline blood glucose despite IV dextrose infusion --Remains very challenging to feed patient as she closes her mouth , clenching her jaw and spits out anything --Family did not want to pursue comfort care and hospice at this time --Family wish to discuss possibility of PEG tube with Gen Surgery. Dr. Constance Haw will evaluate patient on 4/5 -Will also need continued conversations with palliative care around prognosis  Code Status : DNR  Procedures-- bedside decubitus ulcer debridement on 10/17/2019 -Right arm PICC line placement on 10/21/2019 -transfusion of 2 units of PRBC on 10/21/2019 -Bedside decubitus debridement by Dr. Constance Haw on 10/22/2019  Family Communication:    Discussed with husband and the bedside and son, Valynn Schamberger over the phone  Consults  :  Palliative care and general surgery  DVT Prophylaxis  :  Teds and SCDs/rectal bleeding/low platelets  Lab Results  Component Value Date   PLT 38 (L) 11/10/2019   Inpatient Medications  Scheduled Meds: . sodium chloride   Intravenous Once  . Chlorhexidine Gluconate Cloth  6 each Topical Daily  . collagenase   Topical BID  . feeding supplement (ENSURE ENLIVE)  237 mL Oral TID  . hydrocortisone  25 mg Rectal BID  . levothyroxine  37.5 mcg Intravenous Q0600  . mouth rinse  15 mL Mouth Rinse BID  . pantoprazole (PROTONIX) IV  40 mg Intravenous Q24H  . polyethylene glycol  17 g Oral Daily  . sodium chloride flush  10-40 mL Intracatheter Q12H   Continuous Infusions: . sodium chloride 10 mL/hr at 10/21/19 2018  . dextrose 50 mL/hr at 11/10/19 0915   PRN Meds:.sodium chloride, acetaminophen **OR** acetaminophen, bisacodyl, fentaNYL (SUBLIMAZE) injection, LORazepam, ondansetron (ZOFRAN) IV, sodium chloride flush   Anti-infectives (From admission, onward)   Start     Dose/Rate Route Frequency Ordered Stop   10/25/19 1500  anidulafungin (ERAXIS) 100 mg in sodium chloride 0.9 % 100 mL IVPB     100 mg 78 mL/hr over 100 Minutes Intravenous Every 24 hours 10/24/19 1728 10/28/19 1803   10/24/19 1400  fluconazole (DIFLUCAN) IVPB 200 mg  Status:  Discontinued     200 mg 100 mL/hr over 60 Minutes Intravenous Every 24 hours  10/23/19 1144 10/24/19 1717   10/23/19 1400  fluconazole (DIFLUCAN) IVPB 400 mg     400 mg 100 mL/hr over 120 Minutes Intravenous  Once 10/23/19 1144 10/23/19 2302   10/22/19 2200  cefTRIAXone (ROCEPHIN) 2 g in sodium chloride 0.9 % 100 mL IVPB     2 g 200 mL/hr over 30 Minutes Intravenous Every 24 hours 10/22/19 1022 10/28/19 2259   10/17/19 2359  vancomycin (VANCOREADY) IVPB 1250 mg/250 mL     1,250 mg 166.7 mL/hr over 90 Minutes Intravenous Every 48 hours 10/15/2019 2022 10/22/19 2222   11/04/2019 2359  metroNIDAZOLE (FLAGYL) IVPB 500 mg  Status:  Discontinued     500  mg 100 mL/hr over 60 Minutes Intravenous Every 8 hours 10/09/2019 2047 10/26/19 1604   11/07/2019 2359  ceFEPIme (MAXIPIME) 2 g in sodium chloride 0.9 % 100 mL IVPB  Status:  Discontinued     2 g 200 mL/hr over 30 Minutes Intravenous Every 24 hours 10/15/2019 2012 10/22/19 1022   10/20/2019 1430  vancomycin (VANCOCIN) IVPB 1000 mg/200 mL premix     1,000 mg 200 mL/hr over 60 Minutes Intravenous  Once 10/20/2019 1428 11/05/2019 1713   10/08/2019 1430  piperacillin-tazobactam (ZOSYN) IVPB 3.375 g     3.375 g 100 mL/hr over 30 Minutes Intravenous  Once 10/14/2019 1428 10/21/2019 1607       Objective:   Vitals:   11/09/19 2015 11/10/19 0440 11/10/19 0800 11/10/19 1521  BP: (!) 82/40 (!) 81/45 104/66 (!) 98/54  Pulse: 85 79 84 89  Resp: '19 17 18 18  ' Temp: (!) 96.9 F (36.1 C) (!) 96 F (35.6 C) (!) 97.4 F (36.3 C) 98.2 F (36.8 C)  TempSrc: Axillary Axillary Axillary Axillary  SpO2: 100% 100% 100% 100%  Weight:      Height:        Wt Readings from Last 3 Encounters:  10/22/19 80.2 kg  06/13/19 78.5 kg  10/19/18 73.6 kg    Intake/Output Summary (Last 24 hours) at 11/10/2019 1627 Last data filed at 11/10/2019 1500 Gross per 24 hour  Intake 1391.11 ml  Output 200 ml  Net 1191.11 ml   Physical Exam  General exam: somnolent, no distress Respiratory system: Clear to auscultation. Respiratory effort normal. Cardiovascular system:RRR. No murmurs, rubs, gallops. Gastrointestinal system: Abdomen is nondistended, soft and nontender. No organomegaly or masses felt. Normal bowel sounds heard. Central nervous system:No focal neurological deficits. Extremities: anasarca Skin: No rashes, lesions or ulcers Psychiatry: nonverbal     Data Review:   Micro Results No results found for this or any previous visit (from the past 240 hour(s)).  Radiology Reports CT ABDOMEN PELVIS WO CONTRAST  Result Date: 11/05/2019 CLINICAL DATA:  Decubitus ulcers EXAM: CT ABDOMEN AND PELVIS WITHOUT CONTRAST  TECHNIQUE: Multidetector CT imaging of the abdomen and pelvis was performed following the standard protocol without IV contrast. COMPARISON:  None. FINDINGS: Lower chest: No acute abnormality. Hepatobiliary: Scattered calcifications are noted within the liver. Gallbladder is well distended with multiple dependent gallstones. Pancreas: Unremarkable. No pancreatic ductal dilatation or surrounding inflammatory changes. Spleen: Normal in size without focal abnormality. Adrenals/Urinary Tract: Adrenal glands are within normal limits. Kidneys are well visualized bilaterally with renal vascular calcifications. No renal stones are seen. No obstructive changes are noted. The bladder is partially distended. Stomach/Bowel: The appendix is not well visualized although no inflammatory changes to suggest appendicitis are noted. Retained fecal material is noted throughout the colon consistent with a mild degree of constipation without  obstructive change. No small bowel abnormality is seen. Stomach is unremarkable. Vascular/Lymphatic: Aortic atherosclerosis. No enlarged abdominal or pelvic lymph nodes. Reproductive: Uterus and bilateral adnexa are unremarkable. Other: No abdominal wall hernia or abnormality. No abdominopelvic ascites. Musculoskeletal: Mild changes of anasarca are noted. Decubitus ulcer is noted laterally near the right hip with inflammatory changes extending from the skin towards the greater trochanter. This area measures approximately 7.6 x 4.3 cm. No definitive bony erosive changes to suggest osteomyelitis are noted at this time. No other definitive decubitus ulcer is seen. Degenerative changes of lumbar spine are seen. IMPRESSION: Right lateral decubitus ulcer with subcutaneous inflammatory changes extending towards the greater trochanter of the right proximal femur. No definitive bony destructive changes are seen. Considerable subcutaneous inflammatory changes noted with air tracking towards the lateral aspect  of the femur. Changes of mild constipation. Cholelithiasis without complicating factors. Electronically Signed   By: Inez Catalina M.D.   On: 10/13/2019 16:51   US RENAL  Result Date: 10/18/2019 CLINICAL DATA:  Acute renal insufficiency. EXAM: RENAL / URINARY TRACT ULTRASOUND COMPLETE COMPARISON:  10/23/2019 CT FINDINGS: Right Kidney: Renal measurements: 9.3 x 4.4 x 5.8 cm = volume: 123 mL. Increased renal echogenicity. No hydronephrosis. Left Kidney: Renal measurements: 9.6 x 5.2 x 4.6 cm = volume: 119 mL. Increased renal echogenicity. No hydronephrosis. Bladder: Decompressed. Other: Trace perinephric edema/fluid likely relates to renal insufficiency. Note is made of gallbladder sludge and stones including at 1.0 cm. IMPRESSION: 1. No hydronephrosis. Increased renal echogenicity, consistent with medical renal disease. 2. Cholelithiasis without acute cholecystitis. Electronically Signed   By: Abigail Miyamoto M.D.   On: 10/18/2019 11:51   DG Chest Port 1 View  Result Date: 10/25/2019 CLINICAL DATA:  Sepsis EXAM: PORTABLE CHEST 1 VIEW COMPARISON:  11/05/2014 FINDINGS: Stable positioning of a left-sided implanted cardiac device. Post CABG changes. Stable cardiomediastinal contours. No focal airspace consolidation, pleural effusion, or pneumothorax. IMPRESSION: No acute cardiopulmonary findings. Electronically Signed   By: Davina Poke D.O.   On: 10/26/2019 14:47   [place] EKG SITE RITE  Result Date: 10/20/2019 If Site Rite image not attached, placement could not be confirmed due to current cardiac rhythm.   CBC Recent Labs  Lab 11/04/19 0518 11/06/19 0435 11/06/19 0845 11/08/19 0807 11/10/19 0800  WBC 5.4 6.8 5.8 4.3 2.9*  HGB 7.8* 7.6* 7.5* 7.4* 6.7*  HCT 25.7* 25.1* 24.4* 24.2* 21.5*  PLT 28* 33* 32* 37* 38*  MCV 94.5 94.4 95.3 96.4 94.3  MCH 28.7 28.6 29.3 29.5 29.4  MCHC 30.4 30.3 30.7 30.6 31.2  RDW 17.8* 17.7* 17.6* 17.6* 17.3*    Chemistries  Recent Labs  Lab 11/06/19 0435  11/06/19 0845 11/08/19 0807 11/09/19 0439 11/10/19 0800  NA 138 138 136 137 135  K 3.8 3.9 3.8 3.7 3.7  CL 116* 115* 113* 115* 113*  CO2 17* 17* 16* 17* 17*  GLUCOSE 118* 132* 96 92 95  BUN 25* 25* 24* 23 23  CREATININE 1.32* 1.32* 1.46* 1.42* 1.56*  CALCIUM 7.7* 7.6* 7.7* 7.5* 7.6*  AST  --   --   --  10*  --   ALT  --   --   --  7  --   ALKPHOS  --   --   --  78  --   BILITOT  --   --   --  0.4  --    ------------------------------------------------------------------------------------------------------------------ No results for input(s): CHOL, HDL, LDLCALC, TRIG, CHOLHDL, LDLDIRECT in the last 72 hours.  Lab Results  Component Value Date   HGBA1C 5.5 10/19/2019   ------------------------------------------------------------------------------------------------------------------ No results for input(s): TSH, T4TOTAL, T3FREE, THYROIDAB in the last 72 hours.  Invalid input(s): FREET3 ------------------------------------------------------------------------------------------------------------------ No results for input(s): VITAMINB12, FOLATE, FERRITIN, TIBC, IRON, RETICCTPCT in the last 72 hours.  Coagulation profile No results for input(s): INR, PROTIME in the last 168 hours.  No results for input(s): DDIMER in the last 72 hours.  Cardiac Enzymes No results for input(s): CKMB, TROPONINI, MYOGLOBIN in the last 168 hours.  Invalid input(s): CK     Component Value Date/Time   BNP 583.7 (H) 11/01/2014 9201   Kathie Dike M.D on 11/10/2019 at 4:27 PM  Go to [email or web address] - for contact info  [facility] - Office  [phone]

## 2019-11-11 ENCOUNTER — Inpatient Hospital Stay (HOSPITAL_COMMUNITY): Payer: Medicare Other

## 2019-11-11 DIAGNOSIS — I340 Nonrheumatic mitral (valve) insufficiency: Secondary | ICD-10-CM

## 2019-11-11 DIAGNOSIS — I351 Nonrheumatic aortic (valve) insufficiency: Secondary | ICD-10-CM

## 2019-11-11 LAB — BASIC METABOLIC PANEL
Anion gap: 6 (ref 5–15)
BUN: 22 mg/dL (ref 8–23)
CO2: 16 mmol/L — ABNORMAL LOW (ref 22–32)
Calcium: 7.3 mg/dL — ABNORMAL LOW (ref 8.9–10.3)
Chloride: 106 mmol/L (ref 98–111)
Creatinine, Ser: 1.49 mg/dL — ABNORMAL HIGH (ref 0.44–1.00)
GFR calc Af Amer: 38 mL/min — ABNORMAL LOW (ref >60)
GFR calc non Af Amer: 32 mL/min — ABNORMAL LOW (ref >60)
Glucose, Bld: 541 mg/dL (ref 70–99)
Potassium: 3.6 mmol/L (ref 3.5–5.1)
Sodium: 128 mmol/L — ABNORMAL LOW (ref 135–145)

## 2019-11-11 LAB — GLUCOSE, CAPILLARY
Glucose-Capillary: 101 mg/dL — ABNORMAL HIGH (ref 70–99)
Glucose-Capillary: 107 mg/dL — ABNORMAL HIGH (ref 70–99)
Glucose-Capillary: 109 mg/dL — ABNORMAL HIGH (ref 70–99)
Glucose-Capillary: 110 mg/dL — ABNORMAL HIGH (ref 70–99)
Glucose-Capillary: 86 mg/dL (ref 70–99)
Glucose-Capillary: 88 mg/dL (ref 70–99)
Glucose-Capillary: 90 mg/dL (ref 70–99)
Glucose-Capillary: 91 mg/dL (ref 70–99)

## 2019-11-11 LAB — BPAM RBC
Blood Product Expiration Date: 202105042359
ISSUE DATE / TIME: 202104031742
Unit Type and Rh: 9500

## 2019-11-11 LAB — CBC
HCT: 24.7 % — ABNORMAL LOW (ref 36.0–46.0)
Hemoglobin: 7.7 g/dL — ABNORMAL LOW (ref 12.0–15.0)
MCH: 28.6 pg (ref 26.0–34.0)
MCHC: 31.2 g/dL (ref 30.0–36.0)
MCV: 91.8 fL (ref 80.0–100.0)
Platelets: 34 10*3/uL — ABNORMAL LOW (ref 150–400)
RBC: 2.69 MIL/uL — ABNORMAL LOW (ref 3.87–5.11)
RDW: 17.7 % — ABNORMAL HIGH (ref 11.5–15.5)
WBC: 3.2 10*3/uL — ABNORMAL LOW (ref 4.0–10.5)
nRBC: 0.6 % — ABNORMAL HIGH (ref 0.0–0.2)

## 2019-11-11 LAB — TYPE AND SCREEN
ABO/RH(D): B POS
Antibody Screen: NEGATIVE
Unit division: 0

## 2019-11-11 LAB — ECHOCARDIOGRAM COMPLETE
Height: 64 in
Weight: 2828.94 oz

## 2019-11-11 NOTE — Progress Notes (Signed)
2 D echo completed 

## 2019-11-11 NOTE — Progress Notes (Signed)
Patient Demographics:    [name], is a 85 y.o. female, DOB - 01-17-1935, GNO:[id number]  Admit date - 10/31/2019   Admitting Physician Ejiroghene Arlyce Dice, MD  Outpatient Primary MD for the patient is [name], MD  LOS - 52  Chief Complaint  Patient presents with  . Skin Ulcer       Subjective:   Does not answer questions or follow commands.  Keeps eyes closed during visit.  Assessment  & Plan :    Principal Problem:   Severe sepsis with septic shock - Klebsiella/Proteus/E. coli/Bacteroides and Staph  Active Problems:   Hypertension   Type II diabetes mellitus (HCC)   Mobitz type 2 second degree atrioventricular block   S/P ICD (internal cardiac defibrillator) procedure, with upgrade to ICD/CRT-D  [employer]   Cardiomyopathy, ischemic EF 20 to 25 %/history of combined systolic and diastolic dysfunction CHF   Polymorphic ventricular tachycardia (HCC)   Sepsis ([place])   Decubitus ulcer of right hip, stage 4 /Infected with E-coli and Proteus   Failure to thrive in adult   Stage I pressure ulcer of sacral region   Decubitus ulcer of left hip, stage 3/Infected with E-coli and Proteus   Bacteremia due to Gram-negative bacteria   Gram negative sepsis (HCC)   AKI (acute kidney injury) ([place])   Goals of care, counseling/discussion   Palliative care by specialist   Encounter for hospice care discussion   Hematochezia   Sepsis secondary to Klebsiella UTI ([place])   Anemia due to acute blood loss/rectal bleeding   Thrombocytopenia Acquired   Brief Summary 85 year old female with a history of advanced Alzheimer's dementia, CAD, H/o combined systolic and diastolic dysfunction CHF/HFrEF/ischemic cardiomyopathy with EF 20 to 25%, h/o torsades VT status post AICD, H/o HTN, DM2, HLD, admitted on 10/17/2019 from home with metabolic encephalopathy in the setting of presumed infection (leukocytosis and Lactic  acid was 3.1) with multiple decubitus ulcers -Weaned off IV Levophed on 10/22/19 -Requiring warming blanket from time to time ---Patient still unable to have oral intake due to advanced cognitive deficits--- she will spit food out, clenching her jaw and resist any attempt to feed her  --Pt has been made NPO for now as she is not awake or alert enough to safely feed by mouth. Husband has requested permission to be able to feed her by mouth when he is here visiting----Husband accepts aspiration risk -overall prognosis remains poor due to inability to take oral intake --On IV dextrose solution due to hypoglycemia risk as a result of lack of oral intake -If platelet count gets above 50,000 may be a candidate for PEG placement  Assessment/Plan: 1)Severe sepsis with septic shock-- POA -Etiology is multifactorial:-Patient has Klebsiella UTI (urine cx 10/17/19), E. coli and Proteus wound decubitus ulcer/wound infection (wound cx from 10/12/2019 and 10/17/19) and staph epi and Bacteroides bacteremia from blood culture dated 10/19/2019 -Weaned off IV Levophed on 10/22/19 --Stopped vancomycin after 10/21/2019 -MRSA PCR negative on 10/20/2019 -Repeat blood culture from 10/21/2019 NGTD -Patient remains encephalopathic, at baseline she does have advanced dementia  with significant cognitive and memory deficits -WBC is down to 5.4  from a peak of 25.6 -PCT was  2.91, ESR 60, CRP 14.1 - overall prognosis remains poor especially given lack of  oral intake -Antimicrobials/Anti-fungal this admission: - Diflucan 3/16 and 3/17  -Ceftriaxone 10/22/2019 through 10/28/2019 inclusive  --metronidazole -11/07/2019 through 10/26/2019 inclusive  --zosyn x 1 on 11/04/2019 -- cefepime 10/23/2019 through 10/22/19 -Vanc --10/19/2019 through 10/22/19 -Eraxis -10/25/2019 through 10/28/2019 inclusive  2)Bacteremia--B.fragilis and staph epi--- ???  Significance of blood culture from 10/29/2019 -??  Contaminant, however patient was septic --Sepsis  pathophysiology and Antibiotics as above #1 -Repeat blood cultures on 10/21/2019 NGTD - 3)Infected Bilateral Hip and Sacral decubitus ulcers--please see photos in epic -11/06/2019 CT abdomen/pelvis--right lateral decubitus ulcer with subcutaneous inflammatory changes extending toward the greater trochanter. There is no bony destructive changes. There is considerable inflammatory change with air tracking toward the lateral aspect of the femur. -Since wounds are bilateral, is difficult to position patient so the pressure is offloaded from wounds -General surgery consultappreciated -10/17/19--bedside debridement -Wound cultures from 10/18/2019 and 10/17/2019 with E. coli and Proteus --Wound culture with fungus reluctant to use Diflucan due to prolonged QT concerns---okay to treat with anidulafungin -Sepsis pathophysiology and completed antibiotics as above #1  4) Klebsiella UTI--urine culture from 10/17/2019 noted --Sepsis pathophysiology and completed antibiotics as above #1  5)Failure to thrive -Serum B12--199-->supplement -TSH--37.364, Free T4--0.28 -Folate--7.2 -PT evaluation-->SNF -continues to have poor po intake -Cognitive status and encephalopathy makes it difficult to feed orally ---Patient still unable to have oral intake due to advanced cognitive deficits--- she will spit food out, clenching her jaw and resist any attempt to feed her -Continue judicious IV dextrose fluids given that patient's EF is down to 20 to 25% -Low blood glucose despite IV dextrose infusion --> she has been transitioned to D10 infusion -She is developing significant anasarca due to third spacing IV fluids --Remains very challenging to feed patient as she closes her mouth , clenching her jaw and spits out anything ---Pt has been made NPO for now as she is not awake or alert enough to safely feed by mouth. Husband has requested permission to be able to feed her by mouth when he is here visiting--Husband accepts  aspiration risk -Since she is unable to get any nutrition by mouth and family wishes to continue with aggressive care, I have offered PEG tube placement. Husband and family willing to discuss this further with General Surgery if she is a candidate. Discussed with Dr. Arnoldo Morale who will ask Dr. Constance Haw to see patient on monday, since she is familiar with patient.   6)Symptomatic acute on chronic blood loss anemia due to Rectal bleeding- transfusion of 2 units of PRBC on 10/21/2019 and 1 unit of PRBC on 4/3 -No further rectal bleeding -Protonix for GI prophylaxis GI consult appreciated suspect rectal injury from fecal impaction--suppositories as ordered   7)CKD stage 3b -initially felt to be AKI but now more likely progression of CKD -pt likely has progression of underlying CKD -Creatinine hovering below 2 -No recent renal labs available to compare current values to -renal US-->medical renal disease -Creatinine is down to 1.5 from a peak of 1.83 - renally adjust medications, avoid nephrotoxic agents / dehydration  / hypotension -Very scant urine output in Foley bag -Foley was placed due to urinary retention  8)H/o combined systolic and diastolic dysfunction CHF/HFrEF/ischemic cardiomyopathy with EF 25-30% -BP improved with iVF     9)Hypernatremia/dehydration -Resolved with IV fluids, --On IV dextrose solution (D10) due to hypoglycemia risk as a result of lack of oral intake  10)Alzheimer's dementia/ambulatory dysfunction -Continue Nam[place] -spouse states pt screams frequently at home -At baseline patient has very advanced dementia  with severe cognitive and memory deficits, she has been bed-bound for the last 3 months PTA, requiring total care  11)Coronary artery disease -hx of CABG in 2009 (LIMA-LAD, SVG-ramus, SVG-OM). Hx of RCA stent 04/2012 -no chest pain presently Discontinued Aspirin and Plavix due to rectal bleeding with symptomatic Anemia -Continue statin -Coreg on hold  due to hypotension  12)Social/Ethics--- overall prognosis is poor, patient is a DNR/DNI -Plan of care, goals of care discussed with patient's husband at bedside -Official palliative care consult appreciated -Family declined comfort care and hospice at this time -Very advanced dementia with significant cognitive and memory deficits--- patient is total care   13)Thrombocytopenia---  Platelets 206 >> 188>>> 117>> 74>> 48>>> 38>>32>>27>>24>>18>>19>>28>>38>>34K.  -- Possibly related to underlying infectious process.  No signs of bleeding at this time.   --Consider transfusion (if family desires) if platelet count below 10K or if bleeding occurs  14)Ventricular Tachycardia/H/o Torsades -status post AICD placement - she has CRT-D device followed by EP - she underwent ICD upgrade of single chamber ICD to [employer] CRT-D  Disposition Plan: Patient From: Home D/C Place: Hosp[facility] at Home [facility]s [facility] Barriers: Not Clinically Stable--poor po intake, -Borderline blood glucose despite IV dextrose infusion --Remains very challenging to feed patient as she closes her mouth , clenching her jaw and spits out anything --Family did not want to pursue comfort care and hospice at this time --Family wish to discuss possibility of PEG tube with Gen Surgery. Dr. Constance Haw will evaluate patient on 4/5 -Will also need continued conversations with palliative care around prognosis  Code Status : DNR  Procedures-- bedside decubitus ulcer debridement on 10/17/2019 -Right arm PICC line placement on 10/21/2019 -transfusion of 2 units of PRBC on 10/21/2019 -Bedside decubitus debridement by Dr. Constance Haw on 10/22/2019  Family Communication:    Discussed with husband and the bedside   Consults  : Palliative care and general surgery  DVT Prophylaxis  :  Teds and SCDs/rectal bleeding/low platelets  Lab Results  Component Value Date   PLT 34 (L) 11/11/2019   Inpatient Medications  Scheduled Meds: .  Chlorhexidine Gluconate Cloth  6 each Topical Daily  . collagenase   Topical BID  . feeding supplement (ENSURE ENLIVE)  237 mL Oral TID  . hydrocortisone  25 mg Rectal BID  . levothyroxine  37.5 mcg Intravenous Q0600  . mouth rinse  15 mL Mouth Rinse BID  . pantoprazole (PROTONIX) IV  40 mg Intravenous Q24H  . polyethylene glycol  17 g Oral Daily  . sodium chloride flush  10-40 mL Intracatheter Q12H   Continuous Infusions: . sodium chloride 10 mL/hr at 10/21/19 2018  . dextrose 50 mL/hr at 11/11/19 1055   PRN Meds:.sodium chloride, acetaminophen **OR** acetaminophen, bisacodyl, fentaNYL (SUBLIMAZE) injection, LORazepam, ondansetron (ZOFRAN) IV, sodium chloride flush   Anti-infectives (From admission, onward)   Start     Dose/Rate Route Frequency Ordered Stop   10/25/19 1500  anidulafungin (ERAXIS) 100 mg in sodium chloride 0.9 % 100 mL IVPB     100 mg 78 mL/hr over 100 Minutes Intravenous Every 24 hours 10/24/19 1728 10/28/19 1803   10/24/19 1400  fluconazole (DIFLUCAN) IVPB 200 mg  Status:  Discontinued     200 mg 100 mL/hr over 60 Minutes Intravenous Every 24 hours 10/23/19 1144 10/24/19 1717   10/23/19 1400  fluconazole (DIFLUCAN) IVPB 400 mg     400 mg 100 mL/hr over 120 Minutes Intravenous  Once 10/23/19 1144 10/23/19 2302   10/22/19 2200  cefTRIAXone (ROCEPHIN) 2 g in sodium chloride 0.9 % 100 mL IVPB     2 g 200 mL/hr over 30 Minutes Intravenous Every 24 hours 10/22/19 1022 10/28/19 2259   10/17/19 2359  vancomycin (VANCOREADY) IVPB 1250 mg/250 mL     1,250 mg 166.7 mL/hr over 90 Minutes Intravenous Every 48 hours 11/04/2019 2022 10/22/19 2222   10/24/2019 2359  metroNIDAZOLE (FLAGYL) IVPB 500 mg  Status:  Discontinued     500 mg 100 mL/hr over 60 Minutes Intravenous Every 8 hours 11/02/2019 2047 10/26/19 1604   10/17/2019 2359  ceFEPIme (MAXIPIME) 2 g in sodium chloride 0.9 % 100 mL IVPB  Status:  Discontinued     2 g 200 mL/hr over 30 Minutes Intravenous Every 24 hours  10/22/2019 2012 10/22/19 1022   11/06/2019 1430  vancomycin (VANCOCIN) IVPB 1000 mg/200 mL premix     1,000 mg 200 mL/hr over 60 Minutes Intravenous  Once 10/28/2019 1428 10/23/2019 1713   10/26/2019 1430  piperacillin-tazobactam (ZOSYN) IVPB 3.375 g     3.375 g 100 mL/hr over 30 Minutes Intravenous  Once 10/24/2019 1428 11/06/2019 1607       Objective:   Vitals:   11/10/19 2223 11/11/19 0552 11/11/19 1100 11/11/19 1612  BP: (!) '86/71 98/72 94/62 ' (!) 120/40  Pulse: 86 76 76 76  Resp: '19 18 18 18  ' Temp: (!) 97.3 F (36.3 C) (!) 97.4 F (36.3 C) (!) 97.2 F (36.2 C) 98 F (36.7 C)  TempSrc: Oral Axillary Axillary   SpO2: 100% 100% 100% 100%  Weight:      Height:        Wt Readings from Last 3 Encounters:  10/22/19 80.2 kg  06/13/19 78.5 kg  10/19/18 73.6 kg    Intake/Output Summary (Last 24 hours) at 11/11/2019 2023 Last data filed at 11/11/2019 1800 Gross per 24 hour  Intake 1224.81 ml  Output 250 ml  Net 974.81 ml   Physical Exam  General exam: Does not open eyes, laying in bed, no distress Respiratory system: Clear to auscultation. Respiratory effort normal. Cardiovascular system:RRR. No murmurs, rubs, gallops. Gastrointestinal system: Abdomen is nondistended, soft and nontender. No organomegaly or masses felt. Normal bowel sounds heard. Central nervous system: No focal neurological deficits. Extremities: Anasarca Skin: Bilateral hip ulcers Psychiatry: Nonverbal.     Data Review:   Micro Results No results found for this or any previous visit (from the past 240 hour(s)).  Radiology Reports CT ABDOMEN PELVIS WO CONTRAST  Result Date: 10/31/2019 CLINICAL DATA:  Decubitus ulcers EXAM: CT ABDOMEN AND PELVIS WITHOUT CONTRAST TECHNIQUE: Multidetector CT imaging of the abdomen and pelvis was performed following the standard protocol without IV contrast. COMPARISON:  None. FINDINGS: Lower chest: No acute abnormality. Hepatobiliary: Scattered calcifications are noted within the  liver. Gallbladder is well distended with multiple dependent gallstones. Pancreas: Unremarkable. No pancreatic ductal dilatation or surrounding inflammatory changes. Spleen: Normal in size without focal abnormality. Adrenals/Urinary Tract: Adrenal glands are within normal limits. Kidneys are well visualized bilaterally with renal vascular calcifications. No renal stones are seen. No obstructive changes are noted. The bladder is partially distended. Stomach/Bowel: The appendix is not well visualized although no inflammatory changes to suggest appendicitis are noted. Retained fecal material is noted throughout the colon consistent with a mild degree of constipation without obstructive change. No small bowel abnormality is seen. Stomach is unremarkable. Vascular/Lymphatic: Aortic atherosclerosis. No enlarged abdominal or pelvic lymph nodes. Reproductive: Uterus and bilateral adnexa are unremarkable. Other: No abdominal wall hernia  or abnormality. No abdominopelvic ascites. Musculoskeletal: Mild changes of anasarca are noted. Decubitus ulcer is noted laterally near the right hip with inflammatory changes extending from the skin towards the greater trochanter. This area measures approximately 7.6 x 4.3 cm. No definitive bony erosive changes to suggest osteomyelitis are noted at this time. No other definitive decubitus ulcer is seen. Degenerative changes of lumbar spine are seen. IMPRESSION: Right lateral decubitus ulcer with subcutaneous inflammatory changes extending towards the greater trochanter of the right proximal femur. No definitive bony destructive changes are seen. Considerable subcutaneous inflammatory changes noted with air tracking towards the lateral aspect of the femur. Changes of mild constipation. Cholelithiasis without complicating factors. Electronically Signed   By: Inez Catalina M.D.   On: 10/23/2019 16:51   US RENAL  Result Date: 10/18/2019 CLINICAL DATA:  Acute renal insufficiency. EXAM: RENAL /  URINARY TRACT ULTRASOUND COMPLETE COMPARISON:  10/12/2019 CT FINDINGS: Right Kidney: Renal measurements: 9.3 x 4.4 x 5.8 cm = volume: 123 mL. Increased renal echogenicity. No hydronephrosis. Left Kidney: Renal measurements: 9.6 x 5.2 x 4.6 cm = volume: 119 mL. Increased renal echogenicity. No hydronephrosis. Bladder: Decompressed. Other: Trace perinephric edema/fluid likely relates to renal insufficiency. Note is made of gallbladder sludge and stones including at 1.0 cm. IMPRESSION: 1. No hydronephrosis. Increased renal echogenicity, consistent with medical renal disease. 2. Cholelithiasis without acute cholecystitis. Electronically Signed   By: Abigail Miyamoto M.D.   On: 10/18/2019 11:51   DG Chest Port 1 View  Result Date: 10/30/2019 CLINICAL DATA:  Sepsis EXAM: PORTABLE CHEST 1 VIEW COMPARISON:  11/05/2014 FINDINGS: Stable positioning of a left-sided implanted cardiac device. Post CABG changes. Stable cardiomediastinal contours. No focal airspace consolidation, pleural effusion, or pneumothorax. IMPRESSION: No acute cardiopulmonary findings. Electronically Signed   By: Davina Poke D.O.   On: 10/26/2019 14:47   ECHOCARDIOGRAM COMPLETE  Result Date: 11/11/2019    ECHOCARDIOGRAM REPORT   Patient Name:   [name] Date of Exam: 11/11/2019 Medical Rec #:  [id number]    Height:       64.0 in Accession #:    [id number]   Weight:       176.8 lb Date of Birth:  Oct 15, 1934    BSA:          1.857 m Patient Age:    24 years     BP:           98/72 mmHg Patient Gender: F            HR:           76 bpm. Exam Location:  [facility] Procedure: 2D Echo Indications:    Congestive Heart Failure 428.0 / I50.9  History:        Patient has prior history of Echocardiogram examinations, most                 recent 02/13/2015. CAD; Risk Factors:Hypertension, Diabetes and                 Dyslipidemia. Cardiomyopathy, ischemic EF 20 to 25 %/S/P ICD,                 Mobitz type 2 second degree atrioventricular block, Complete                  heart block.  Sonographer:    Leavy Cella RDCS (AE) Referring Phys: [place]  1. Severe global reduction in LV systolic function; moderate LVH; grade 2 diastolic dysfunction;  moderate to severe MR; moderate LAE; moderate TR; mild pulmonary hypertension.  2. Left ventricular ejection fraction, by estimation, is 25 to 30%. The left ventricle has severely decreased function. The left ventricle demonstrates global hypokinesis. There is moderate left ventricular hypertrophy. Left ventricular diastolic parameters are consistent with Grade II diastolic dysfunction (pseudonormalization). Elevated left atrial pressure.  3. Right ventricular systolic function is normal. The right ventricular size is normal. There is mildly elevated pulmonary artery systolic pressure.  4. Left atrial size was moderately dilated.  5. The mitral valve is normal in structure. Moderate to severe mitral valve regurgitation. No evidence of mitral stenosis.  6. Tricuspid valve regurgitation is moderate.  7. The aortic valve is tricuspid. Aortic valve regurgitation is mild. Mild aortic valve sclerosis is present, with no evidence of aortic valve stenosis.  8. The inferior vena cava is normal in size with greater than 50% respiratory variability, suggesting right atrial pressure of 3 mmHg. FINDINGS  Left Ventricle: Left ventricular ejection fraction, by estimation, is 25 to 30%. The left ventricle has severely decreased function. The left ventricle demonstrates global hypokinesis. The left ventricular internal cavity size was normal in size. There is moderate left ventricular hypertrophy. Left ventricular diastolic parameters are consistent with Grade II diastolic dysfunction (pseudonormalization). Elevated left atrial pressure. Right Ventricle: The right ventricular size is normal. Right ventricular systolic function is normal. There is mildly elevated pulmonary artery systolic pressure. The tricuspid regurgitant  velocity is 2.93 m/s, and with an assumed right atrial pressure of 3 mmHg, the estimated right ventricular systolic pressure is 93.5 mmHg. Left Atrium: Left atrial size was moderately dilated. Right Atrium: Right atrial size was normal in size. Pericardium: There is no evidence of pericardial effusion. Mitral Valve: The mitral valve is normal in structure. Normal mobility of the mitral valve leaflets. Moderate to severe mitral valve regurgitation. No evidence of mitral valve stenosis. Tricuspid Valve: The tricuspid valve is normal in structure. Tricuspid valve regurgitation is moderate . No evidence of tricuspid stenosis. Aortic Valve: The aortic valve is tricuspid. Aortic valve regurgitation is mild. Aortic regurgitation PHT measures 379 msec. Mild aortic valve sclerosis is present, with no evidence of aortic valve stenosis. Pulmonic Valve: The pulmonic valve was normal in structure. Pulmonic valve regurgitation is trivial. No evidence of pulmonic stenosis. Aorta: The aortic root is normal in size and structure. Venous: The inferior vena cava is normal in size with greater than 50% respiratory variability, suggesting right atrial pressure of 3 mmHg.  Additional Comments: Severe global reduction in LV systolic function; moderate LVH; grade 2 diastolic dysfunction; moderate to severe MR; moderate LAE; moderate TR; mild pulmonary hypertension. A pacer wire is visualized.  LEFT VENTRICLE PLAX 2D LVIDd:         4.42 cm  Diastology LVIDs:         3.76 cm  LV e' lateral:   2.80 cm/s LV PW:         1.39 cm  LV E/e' lateral: 57.1 LV IVS:        1.17 cm  LV e' medial:    3.33 cm/s LVOT diam:     1.90 cm  LV E/e' medial:  48.0 LVOT Area:     2.84 cm  RIGHT VENTRICLE RV S prime:     8.59 cm/s TAPSE (M-mode): 1.7 cm LEFT ATRIUM             Index LA diam:        4.60 cm 2.48 cm/m LA Vol (  A2C):   84.2 ml 45.35 ml/m LA Vol (A4C):   78.6 ml 42.34 ml/m LA Biplane Vol: 82.1 ml 44.22 ml/m  AORTIC VALVE AI PHT:      379 msec   AORTA Ao Root diam: 2.60 cm MITRAL VALVE                 TRICUSPID VALVE MV Area (PHT): 4.39 cm      TR Peak grad:   34.3 mmHg MV Decel Time: 173 msec      TR Vmax:        293.00 cm/s MR Peak grad:    105.3 mmHg MR Mean grad:    65.0 mmHg   SHUNTS MR Vmax:         513.00 cm/s Systemic Diam: 1.90 cm MR Vmean:        375.0 cm/s MR PISA:         4.02 cm MR PISA Eff ROA: 24 mm MR PISA Radius:  0.80 cm MV E velocity: 160.00 cm/s MV A velocity: 138.00 cm/s MV E/A ratio:  1.16 Kirk Ruths MD Electronically signed by Kirk Ruths MD Signature Date/Time: 11/11/2019/12:10:35 PM    Final    [place] EKG SITE RITE  Result Date: 10/20/2019 If Site Rite image not attached, placement could not be confirmed due to current cardiac rhythm.   CBC Recent Labs  Lab 11/06/19 0435 11/06/19 0845 11/08/19 0807 11/10/19 0800 11/11/19 0528  WBC 6.8 5.8 4.3 2.9* 3.2*  HGB 7.6* 7.5* 7.4* 6.7* 7.7*  HCT 25.1* 24.4* 24.2* 21.5* 24.7*  PLT 33* 32* 37* 38* 34*  MCV 94.4 95.3 96.4 94.3 91.8  MCH 28.6 29.3 29.5 29.4 28.6  MCHC 30.3 30.7 30.6 31.2 31.2  RDW 17.7* 17.6* 17.6* 17.3* 17.7*    Chemistries  Recent Labs  Lab 11/06/19 0845 11/08/19 0807 11/09/19 0439 11/10/19 0800 11/11/19 0528  NA 138 136 137 135 128*  K 3.9 3.8 3.7 3.7 3.6  CL 115* 113* 115* 113* 106  CO2 17* 16* 17* 17* 16*  GLUCOSE 132* 96 92 95 541*  BUN 25* 24* '23 23 22  ' CREATININE 1.32* 1.46* 1.42* 1.56* 1.49*  CALCIUM 7.6* 7.7* 7.5* 7.6* 7.3*  AST  --   --  10*  --   --   ALT  --   --  7  --   --   ALKPHOS  --   --  78  --   --   BILITOT  --   --  0.4  --   --    ------------------------------------------------------------------------------------------------------------------ No results for input(s): CHOL, HDL, LDLCALC, TRIG, CHOLHDL, LDLDIRECT in the last 72 hours.  Lab Results  Component Value Date   HGBA1C 5.5 10/19/2019    ------------------------------------------------------------------------------------------------------------------ No results for input(s): TSH, T4TOTAL, T3FREE, THYROIDAB in the last 72 hours.  Invalid input(s): FREET3 ------------------------------------------------------------------------------------------------------------------ No results for input(s): VITAMINB12, FOLATE, FERRITIN, TIBC, IRON, RETICCTPCT in the last 72 hours.  Coagulation profile No results for input(s): INR, PROTIME in the last 168 hours.  No results for input(s): DDIMER in the last 72 hours.  Cardiac Enzymes No results for input(s): CKMB, TROPONINI, MYOGLOBIN in the last 168 hours.  Invalid input(s): CK     Component Value Date/Time   BNP 583.7 (H) 11/01/2014 7510   Kathie Dike M.D on 11/11/2019 at 8:23 PM  Go to [email or web address] - for contact info  [facility] - Office  [phone]

## 2019-11-12 LAB — BASIC METABOLIC PANEL
Anion gap: 4 — ABNORMAL LOW (ref 5–15)
BUN: 22 mg/dL (ref 8–23)
CO2: 17 mmol/L — ABNORMAL LOW (ref 22–32)
Calcium: 7.6 mg/dL — ABNORMAL LOW (ref 8.9–10.3)
Chloride: 112 mmol/L — ABNORMAL HIGH (ref 98–111)
Creatinine, Ser: 1.59 mg/dL — ABNORMAL HIGH (ref 0.44–1.00)
GFR calc Af Amer: 35 mL/min — ABNORMAL LOW (ref >60)
GFR calc non Af Amer: 30 mL/min — ABNORMAL LOW (ref >60)
Glucose, Bld: 114 mg/dL — ABNORMAL HIGH (ref 70–99)
Potassium: 3.6 mmol/L (ref 3.5–5.1)
Sodium: 133 mmol/L — ABNORMAL LOW (ref 135–145)

## 2019-11-12 LAB — CBC
HCT: 24.9 % — ABNORMAL LOW (ref 36.0–46.0)
Hemoglobin: 7.6 g/dL — ABNORMAL LOW (ref 12.0–15.0)
MCH: 28 pg (ref 26.0–34.0)
MCHC: 30.5 g/dL (ref 30.0–36.0)
MCV: 91.9 fL (ref 80.0–100.0)
Platelets: 35 10*3/uL — ABNORMAL LOW (ref 150–400)
RBC: 2.71 MIL/uL — ABNORMAL LOW (ref 3.87–5.11)
RDW: 17.6 % — ABNORMAL HIGH (ref 11.5–15.5)
WBC: 3.2 10*3/uL — ABNORMAL LOW (ref 4.0–10.5)
nRBC: 0.6 % — ABNORMAL HIGH (ref 0.0–0.2)

## 2019-11-12 LAB — GLUCOSE, CAPILLARY
Glucose-Capillary: 106 mg/dL — ABNORMAL HIGH (ref 70–99)
Glucose-Capillary: 106 mg/dL — ABNORMAL HIGH (ref 70–99)
Glucose-Capillary: 111 mg/dL — ABNORMAL HIGH (ref 70–99)
Glucose-Capillary: 114 mg/dL — ABNORMAL HIGH (ref 70–99)

## 2019-11-12 NOTE — Progress Notes (Signed)
Patient Demographics:    [name], is a 85 y.o. female, DOB - Jun 17, 1935, WPY:[id number]  Admit date - 10/22/2019   Admitting Physician Ejiroghene Arlyce Dice, MD  Outpatient Primary MD for the patient is [name], MD  LOS - 48  Chief Complaint  Patient presents with  . Skin Ulcer       Subjective:   Remains unresponsive  Assessment  & Plan :    Principal Problem:   Severe sepsis with septic shock - Klebsiella/Proteus/E. coli/Bacteroides and Staph  Active Problems:   Hypertension   Type II diabetes mellitus (HCC)   Mobitz type 2 second degree atrioventricular block   S/P ICD (internal cardiac defibrillator) procedure, with upgrade to ICD/CRT-D  [employer]   Cardiomyopathy, ischemic EF 20 to 25 %/history of combined systolic and diastolic dysfunction CHF   Polymorphic ventricular tachycardia (HCC)   Sepsis (HCC)   Decubitus ulcer of right hip, stage 4 /Infected with E-coli and Proteus   Failure to thrive in adult   Stage I pressure ulcer of sacral region   Decubitus ulcer of left hip, stage 3/Infected with E-coli and Proteus   Bacteremia due to Gram-negative bacteria   Gram negative sepsis (HCC)   AKI (acute kidney injury) ([place])   Goals of care, counseling/discussion   Palliative care by specialist   Encounter for hospice care discussion   Hematochezia   Sepsis secondary to Klebsiella UTI ([place])   Anemia due to acute blood loss/rectal bleeding   Thrombocytopenia Acquired   Brief Summary 85 year old female with a history of advanced Alzheimer's dementia, CAD, H/o combined systolic and diastolic dysfunction CHF/HFrEF/ischemic cardiomyopathy with EF 20 to 25%, h/o torsades VT status post AICD, H/o HTN, DM2, HLD, admitted on 10/27/2019 from home with metabolic encephalopathy in the setting of presumed infection (leukocytosis and Lactic acid was 3.1) with multiple decubitus ulcers -Weaned off IV  Levophed on 10/22/19 -Requiring warming blanket from time to time ---Patient still unable to have oral intake due to advanced cognitive deficits--- she will spit food out, clenching her jaw and resist any attempt to feed her  --Pt has been made NPO for now as she is not awake or alert enough to safely feed by mouth. Husband has requested permission to be able to feed her by mouth when he is here visiting----Husband accepts aspiration risk -overall prognosis remains poor due to inability to take oral intake --On IV dextrose solution due to hypoglycemia risk as a result of lack of oral intake -If platelet count gets above 50,000 may be a candidate for PEG placement  Assessment/Plan: 1)Severe sepsis with septic shock-- POA -Etiology is multifactorial:-Patient has Klebsiella UTI (urine cx 10/17/19), E. coli and Proteus wound decubitus ulcer/wound infection (wound cx from 10/27/2019 and 10/17/19) and staph epi and Bacteroides bacteremia from blood culture dated 10/08/2019 -Weaned off IV Levophed on 10/22/19 --Stopped vancomycin after 10/21/2019 -MRSA PCR negative on 10/20/2019 -Repeat blood culture from 10/21/2019 NGTD -Patient remains encephalopathic, at baseline she does have advanced dementia  with significant cognitive and memory deficits -WBC is down to 5.4  from a peak of 25.6 -PCT was  2.91, ESR 60, CRP 14.1 - overall prognosis remains poor especially given lack of oral intake -Antimicrobials/Anti-fungal this admission: - Diflucan 3/16 and 3/17  -  Ceftriaxone 10/22/2019 through 10/28/2019 inclusive  --metronidazole -10/23/2019 through 10/26/2019 inclusive  --zosyn x 1 on 10/17/2019 -- cefepime 10/18/2019 through 10/22/19 -Vanc --11/02/2019 through 10/22/19 -Eraxis -10/25/2019 through 10/28/2019 inclusive  2)Bacteremia--B.fragilis and staph epi--- ???  Significance of blood culture from 11/05/2019 -??  Contaminant, however patient was septic --Sepsis pathophysiology and Antibiotics as above #1 -Repeat blood  cultures on 10/21/2019 NGTD - 3)Infected Bilateral Hip and Sacral decubitus ulcers--please see photos in epic -10/08/2019 CT abdomen/pelvis--right lateral decubitus ulcer with subcutaneous inflammatory changes extending toward the greater trochanter. There is no bony destructive changes. There is considerable inflammatory change with air tracking toward the lateral aspect of the femur. -Since wounds are bilateral, is difficult to position patient so the pressure is offloaded from wounds -General surgery consultappreciated -10/17/19--bedside debridement -Wound cultures from 11/04/2019 and 10/17/2019 with E. coli and Proteus --Wound culture with fungus reluctant to use Diflucan due to prolonged QT concerns---okay to treat with anidulafungin -Sepsis pathophysiology and completed antibiotics as above #1  4) Klebsiella UTI--urine culture from 10/17/2019 noted --Sepsis pathophysiology and completed antibiotics as above #1  5)Failure to thrive -Serum B12--199-->supplement -TSH--37.364, Free T4--0.28 -Folate--7.2 -PT evaluation-->SNF -continues to have poor po intake -Cognitive status and encephalopathy makes it difficult to feed orally ---Patient still unable to have oral intake due to advanced cognitive deficits--- she will spit food out, clenching her jaw and resist any attempt to feed her -Continue judicious IV dextrose fluids given that patient's EF is down to 20 to 25% -Low blood glucose despite IV dextrose infusion --> she has been transitioned to D10 infusion -She is developing significant anasarca due to third spacing IV fluids --Remains very challenging to feed patient as she closes her mouth , clenching her jaw and spits out anything ---Pt has been made NPO for now as she is not awake or alert enough to safely feed by mouth. Husband has requested permission to be able to feed her by mouth when he is here visiting--Husband accepts aspiration risk -Since she is unable to get any nutrition by  mouth and family wishes to continue with aggressive care, I have offered PEG tube placement. Husband and family willing to discuss this further with General Surgery if she is a candidate. Discussed with Dr. Arnoldo Morale who will ask Dr. Constance Haw to see patient on monday, since she is familiar with patient.   6)Symptomatic acute on chronic blood loss anemia due to Rectal bleeding- transfusion of 2 units of PRBC on 10/21/2019 and 1 unit of PRBC on 4/3 -No further rectal bleeding -Protonix for GI prophylaxis GI consult appreciated suspect rectal injury from fecal impaction--suppositories as ordered   7)CKD stage 3b -initially felt to be AKI but now more likely progression of CKD -pt likely has progression of underlying CKD -Creatinine hovering below 2 -No recent renal labs available to compare current values to -renal US-->medical renal disease -Creatinine is down to 1.5 from a peak of 1.83 - renally adjust medications, avoid nephrotoxic agents / dehydration  / hypotension -Very scant urine output in Foley bag -Foley was placed due to urinary retention  8)H/o combined systolic and diastolic dysfunction CHF/HFrEF/ischemic cardiomyopathy with EF 25-30% -BP improved with iVF     9)Hypernatremia/dehydration -Resolved with IV fluids, --On IV dextrose solution (D10) due to hypoglycemia risk as a result of lack of oral intake  10)Alzheimer's dementia/ambulatory dysfunction -Continue Namenda -spouse states pt screams frequently at home -At baseline patient has very advanced dementia with severe cognitive and memory deficits, she has been bed-bound for  the last 3 months PTA, requiring total care  11)Coronary artery disease -hx of CABG in 2009 (LIMA-LAD, SVG-ramus, SVG-OM). Hx of RCA stent 04/2012 -no chest pain presently Discontinued Aspirin and Plavix due to rectal bleeding with symptomatic Anemia -Continue statin -Coreg on hold due to hypotension  12)Social/Ethics--- overall prognosis is  poor, patient is a DNR/DNI -Plan of care, goals of care discussed with patient's husband at bedside -Official palliative care consult appreciated -Family declined comfort care and hospice at this time -Very advanced dementia with significant cognitive and memory deficits--- patient is total care   13)Thrombocytopenia---  Platelets 206 >> 188>>> 117>> 74>> 48>>> 38>>32>>27>>24>>18>>19>>28>>38>>35K.  -- Possibly related to underlying infectious process.  No signs of bleeding at this time.   --Consider transfusion (if family desires) if platelet count below 10K or if bleeding occurs  14)Ventricular Tachycardia/H/o Torsades -status post AICD placement - she has CRT-D device followed by EP - she underwent ICD upgrade of single chamber ICD to [employer] CRT-D  Disposition Plan: Patient From: Home D/C Place: [facility] at Home [facility] Barriers: Not Clinically Stable--poor po intake, -Borderline blood glucose despite IV dextrose infusion --Remains very challenging to feed patient as she closes her mouth , clenching her jaw and spits out anything --Family did not want to pursue comfort care and hospice at this time --Seen by Gen Surgery and not felt to be a candidate for PEG tube -Will also need continued conversations with palliative care around prognosis  Code Status : DNR  Procedures-- bedside decubitus ulcer debridement on 10/17/2019 -Right arm PICC line placement on 10/21/2019 -transfusion of 2 units of PRBC on 10/21/2019 -Bedside decubitus debridement by Dr. Constance Haw on 10/22/2019  Family Communication:    Discussed with husband and the bedside   Consults  : Palliative care and general surgery  DVT Prophylaxis  :  Teds and SCDs/rectal bleeding/low platelets  Lab Results  Component Value Date   PLT 35 (L) 11/12/2019   Inpatient Medications  Scheduled Meds: . Chlorhexidine Gluconate Cloth  6 each Topical Daily  . collagenase   Topical BID  . feeding supplement  (ENSURE ENLIVE)  237 mL Oral TID  . hydrocortisone  25 mg Rectal BID  . levothyroxine  37.5 mcg Intravenous Q0600  . mouth rinse  15 mL Mouth Rinse BID  . pantoprazole (PROTONIX) IV  40 mg Intravenous Q24H  . polyethylene glycol  17 g Oral Daily  . sodium chloride flush  10-40 mL Intracatheter Q12H   Continuous Infusions: . sodium chloride 10 mL/hr at 10/21/19 2018  . dextrose 50 mL/hr at 11/11/19 1055   PRN Meds:.sodium chloride, acetaminophen **OR** acetaminophen, bisacodyl, fentaNYL (SUBLIMAZE) injection, LORazepam, ondansetron (ZOFRAN) IV, sodium chloride flush   Anti-infectives (From admission, onward)   Start     Dose/Rate Route Frequency Ordered Stop   10/25/19 1500  anidulafungin (ERAXIS) 100 mg in sodium chloride 0.9 % 100 mL IVPB     100 mg 78 mL/hr over 100 Minutes Intravenous Every 24 hours 10/24/19 1728 10/28/19 1803   10/24/19 1400  fluconazole (DIFLUCAN) IVPB 200 mg  Status:  Discontinued     200 mg 100 mL/hr over 60 Minutes Intravenous Every 24 hours 10/23/19 1144 10/24/19 1717   10/23/19 1400  fluconazole (DIFLUCAN) IVPB 400 mg     400 mg 100 mL/hr over 120 Minutes Intravenous  Once 10/23/19 1144 10/23/19 2302   10/22/19 2200  cefTRIAXone (ROCEPHIN) 2 g in sodium chloride 0.9 % 100 mL IVPB  2 g 200 mL/hr over 30 Minutes Intravenous Every 24 hours 10/22/19 1022 10/28/19 2259   10/17/19 2359  vancomycin (VANCOREADY) IVPB 1250 mg/250 mL     1,250 mg 166.7 mL/hr over 90 Minutes Intravenous Every 48 hours 10/10/2019 2022 10/22/19 2222   11/04/2019 2359  metroNIDAZOLE (FLAGYL) IVPB 500 mg  Status:  Discontinued     500 mg 100 mL/hr over 60 Minutes Intravenous Every 8 hours 11/07/2019 2047 10/26/19 1604   10/28/2019 2359  ceFEPIme (MAXIPIME) 2 g in sodium chloride 0.9 % 100 mL IVPB  Status:  Discontinued     2 g 200 mL/hr over 30 Minutes Intravenous Every 24 hours 10/22/2019 2012 10/22/19 1022   11/07/2019 1430  vancomycin (VANCOCIN) IVPB 1000 mg/200 mL premix     1,000  mg 200 mL/hr over 60 Minutes Intravenous  Once 10/22/2019 1428 10/17/2019 1713   10/14/2019 1430  piperacillin-tazobactam (ZOSYN) IVPB 3.375 g     3.375 g 100 mL/hr over 30 Minutes Intravenous  Once 10/15/2019 1428 10/30/2019 1607       Objective:   Vitals:   11/12/19 0611 11/12/19 1408 11/12/19 1946 11/12/19 2102  BP: (!) 89/54 (!) 84/48  (!) 96/53  Pulse: 73 63  72  Resp: '18 18  18  ' Temp: (!) 97.5 F (36.4 C) 98.9 F (37.2 C)    TempSrc: Axillary     SpO2: 100% 100% 100% 100%  Weight:      Height:        Wt Readings from Last 3 Encounters:  10/22/19 80.2 kg  06/13/19 78.5 kg  10/19/18 73.6 kg    Intake/Output Summary (Last 24 hours) at 11/12/2019 2200 Last data filed at 11/12/2019 1700 Gross per 24 hour  Intake 449.97 ml  Output --  Net 449.97 ml   Physical Exam  General exam: somnolent, unresponsive Respiratory system: Clear to auscultation. Respiratory effort normal. Cardiovascular system:RRR. No murmurs, rubs, gallops. Gastrointestinal system: Abdomen is nondistended, soft and nontender. No organomegaly or masses felt. Normal bowel sounds heard. Central nervous system: No focal neurological deficits. Extremities: anasarca Skin: bilateral hip ulcers Psychiatry: cannot assess due to mental status.       Data Review:   Micro Results No results found for this or any previous visit (from the past 240 hour(s)).  Radiology Reports CT ABDOMEN PELVIS WO CONTRAST  Result Date: 10/15/2019 CLINICAL DATA:  Decubitus ulcers EXAM: CT ABDOMEN AND PELVIS WITHOUT CONTRAST TECHNIQUE: Multidetector CT imaging of the abdomen and pelvis was performed following the standard protocol without IV contrast. COMPARISON:  None. FINDINGS: Lower chest: No acute abnormality. Hepatobiliary: Scattered calcifications are noted within the liver. Gallbladder is well distended with multiple dependent gallstones. Pancreas: Unremarkable. No pancreatic ductal dilatation or surrounding inflammatory changes.  Spleen: Normal in size without focal abnormality. Adrenals/Urinary Tract: Adrenal glands are within normal limits. Kidneys are well visualized bilaterally with renal vascular calcifications. No renal stones are seen. No obstructive changes are noted. The bladder is partially distended. Stomach/Bowel: The appendix is not well visualized although no inflammatory changes to suggest appendicitis are noted. Retained fecal material is noted throughout the colon consistent with a mild degree of constipation without obstructive change. No small bowel abnormality is seen. Stomach is unremarkable. Vascular/Lymphatic: Aortic atherosclerosis. No enlarged abdominal or pelvic lymph nodes. Reproductive: Uterus and bilateral adnexa are unremarkable. Other: No abdominal wall hernia or abnormality. No abdominopelvic ascites. Musculoskeletal: Mild changes of anasarca are noted. Decubitus ulcer is noted laterally near the right hip with inflammatory changes  extending from the skin towards the greater trochanter. This area measures approximately 7.6 x 4.3 cm. No definitive bony erosive changes to suggest osteomyelitis are noted at this time. No other definitive decubitus ulcer is seen. Degenerative changes of lumbar spine are seen. IMPRESSION: Right lateral decubitus ulcer with subcutaneous inflammatory changes extending towards the greater trochanter of the right proximal femur. No definitive bony destructive changes are seen. Considerable subcutaneous inflammatory changes noted with air tracking towards the lateral aspect of the femur. Changes of mild constipation. Cholelithiasis without complicating factors. Electronically Signed   By: Inez Catalina M.D.   On: 10/15/2019 16:51   US RENAL  Result Date: 10/18/2019 CLINICAL DATA:  Acute renal insufficiency. EXAM: RENAL / URINARY TRACT ULTRASOUND COMPLETE COMPARISON:  10/11/2019 CT FINDINGS: Right Kidney: Renal measurements: 9.3 x 4.4 x 5.8 cm = volume: 123 mL. Increased renal  echogenicity. No hydronephrosis. Left Kidney: Renal measurements: 9.6 x 5.2 x 4.6 cm = volume: 119 mL. Increased renal echogenicity. No hydronephrosis. Bladder: Decompressed. Other: Trace perinephric edema/fluid likely relates to renal insufficiency. Note is made of gallbladder sludge and stones including at 1.0 cm. IMPRESSION: 1. No hydronephrosis. Increased renal echogenicity, consistent with medical renal disease. 2. Cholelithiasis without acute cholecystitis. Electronically Signed   By: Abigail Miyamoto M.D.   On: 10/18/2019 11:51   DG Chest Port 1 View  Result Date: 10/26/2019 CLINICAL DATA:  Sepsis EXAM: PORTABLE CHEST 1 VIEW COMPARISON:  11/05/2014 FINDINGS: Stable positioning of a left-sided implanted cardiac device. Post CABG changes. Stable cardiomediastinal contours. No focal airspace consolidation, pleural effusion, or pneumothorax. IMPRESSION: No acute cardiopulmonary findings. Electronically Signed   By: Davina Poke D.O.   On: 10/26/2019 14:47   ECHOCARDIOGRAM COMPLETE  Result Date: 11/11/2019    ECHOCARDIOGRAM REPORT   Patient Name:   [name] Date of Exam: 11/11/2019 Medical Rec #:  [id number]    Height:       64.0 in Accession #:    [id number]   Weight:       176.8 lb Date of Birth:  07/21/1935    BSA:          1.857 m Patient Age:    41 years     BP:           98/72 mmHg Patient Gender: F            HR:           76 bpm. Exam Location:  [facility] Procedure: 2D Echo Indications:    Congestive Heart Failure 428.0 / I50.9  History:        Patient has prior history of Echocardiogram examinations, most                 recent 02/13/2015. CAD; Risk Factors:Hypertension, Diabetes and                 Dyslipidemia. Cardiomyopathy, ischemic EF 20 to 25 %/S/P ICD,                 Mobitz type 2 second degree atrioventricular block, Complete                 heart block.  Sonographer:    Leavy Cella RDCS (AE) Referring Phys: [place]  1. Severe global reduction in LV systolic  function; moderate LVH; grade 2 diastolic dysfunction; moderate to severe MR; moderate LAE; moderate TR; mild pulmonary hypertension.  2. Left ventricular ejection fraction, by estimation, is 25 to 30%. The  left ventricle has severely decreased function. The left ventricle demonstrates global hypokinesis. There is moderate left ventricular hypertrophy. Left ventricular diastolic parameters are consistent with Grade II diastolic dysfunction (pseudonormalization). Elevated left atrial pressure.  3. Right ventricular systolic function is normal. The right ventricular size is normal. There is mildly elevated pulmonary artery systolic pressure.  4. Left atrial size was moderately dilated.  5. The mitral valve is normal in structure. Moderate to severe mitral valve regurgitation. No evidence of mitral stenosis.  6. Tricuspid valve regurgitation is moderate.  7. The aortic valve is tricuspid. Aortic valve regurgitation is mild. Mild aortic valve sclerosis is present, with no evidence of aortic valve stenosis.  8. The inferior vena cava is normal in size with greater than 50% respiratory variability, suggesting right atrial pressure of 3 mmHg. FINDINGS  Left Ventricle: Left ventricular ejection fraction, by estimation, is 25 to 30%. The left ventricle has severely decreased function. The left ventricle demonstrates global hypokinesis. The left ventricular internal cavity size was normal in size. There is moderate left ventricular hypertrophy. Left ventricular diastolic parameters are consistent with Grade II diastolic dysfunction (pseudonormalization). Elevated left atrial pressure. Right Ventricle: The right ventricular size is normal. Right ventricular systolic function is normal. There is mildly elevated pulmonary artery systolic pressure. The tricuspid regurgitant velocity is 2.93 m/s, and with an assumed right atrial pressure of 3 mmHg, the estimated right ventricular systolic pressure is 56.8 mmHg. Left Atrium: Left  atrial size was moderately dilated. Right Atrium: Right atrial size was normal in size. Pericardium: There is no evidence of pericardial effusion. Mitral Valve: The mitral valve is normal in structure. Normal mobility of the mitral valve leaflets. Moderate to severe mitral valve regurgitation. No evidence of mitral valve stenosis. Tricuspid Valve: The tricuspid valve is normal in structure. Tricuspid valve regurgitation is moderate . No evidence of tricuspid stenosis. Aortic Valve: The aortic valve is tricuspid. Aortic valve regurgitation is mild. Aortic regurgitation PHT measures 379 msec. Mild aortic valve sclerosis is present, with no evidence of aortic valve stenosis. Pulmonic Valve: The pulmonic valve was normal in structure. Pulmonic valve regurgitation is trivial. No evidence of pulmonic stenosis. Aorta: The aortic root is normal in size and structure. Venous: The inferior vena cava is normal in size with greater than 50% respiratory variability, suggesting right atrial pressure of 3 mmHg.  Additional Comments: Severe global reduction in LV systolic function; moderate LVH; grade 2 diastolic dysfunction; moderate to severe MR; moderate LAE; moderate TR; mild pulmonary hypertension. A pacer wire is visualized.  LEFT VENTRICLE PLAX 2D LVIDd:         4.42 cm  Diastology LVIDs:         3.76 cm  LV e' lateral:   2.80 cm/s LV PW:         1.39 cm  LV E/e' lateral: 57.1 LV IVS:        1.17 cm  LV e' medial:    3.33 cm/s LVOT diam:     1.90 cm  LV E/e' medial:  48.0 LVOT Area:     2.84 cm  RIGHT VENTRICLE RV S prime:     8.59 cm/s TAPSE (M-mode): 1.7 cm LEFT ATRIUM             Index LA diam:        4.60 cm 2.48 cm/m LA Vol (A2C):   84.2 ml 45.35 ml/m LA Vol (A4C):   78.6 ml 42.34 ml/m LA Biplane Vol: 82.1 ml 44.22 ml/m  AORTIC VALVE AI PHT:      379 msec  AORTA Ao Root diam: 2.60 cm MITRAL VALVE                 TRICUSPID VALVE MV Area (PHT): 4.39 cm      TR Peak grad:   34.3 mmHg MV Decel Time: 173 msec      TR  Vmax:        293.00 cm/s MR Peak grad:    105.3 mmHg MR Mean grad:    65.0 mmHg   SHUNTS MR Vmax:         513.00 cm/s Systemic Diam: 1.90 cm MR Vmean:        375.0 cm/s MR PISA:         4.02 cm MR PISA Eff ROA: 24 mm MR PISA Radius:  0.80 cm MV E velocity: 160.00 cm/s MV A velocity: 138.00 cm/s MV E/A ratio:  1.16 Kirk Ruths MD Electronically signed by Kirk Ruths MD Signature Date/Time: 11/11/2019/12:10:35 PM    Final    [place] EKG SITE RITE  Result Date: 10/20/2019 If Site Rite image not attached, placement could not be confirmed due to current cardiac rhythm.   CBC Recent Labs  Lab 11/06/19 0845 11/08/19 0807 11/10/19 0800 11/11/19 0528 11/12/19 0410  WBC 5.8 4.3 2.9* 3.2* 3.2*  HGB 7.5* 7.4* 6.7* 7.7* 7.6*  HCT 24.4* 24.2* 21.5* 24.7* 24.9*  PLT 32* 37* 38* 34* 35*  MCV 95.3 96.4 94.3 91.8 91.9  MCH 29.3 29.5 29.4 28.6 28.0  MCHC 30.7 30.6 31.2 31.2 30.5  RDW 17.6* 17.6* 17.3* 17.7* 17.6*    Chemistries  Recent Labs  Lab 11/08/19 0807 11/09/19 0439 11/10/19 0800 11/11/19 0528 11/12/19 0410  NA 136 137 135 128* 133*  K 3.8 3.7 3.7 3.6 3.6  CL 113* 115* 113* 106 112*  CO2 16* 17* 17* 16* 17*  GLUCOSE 96 92 95 541* 114*  BUN 24* '23 23 22 22  ' CREATININE 1.46* 1.42* 1.56* 1.49* 1.59*  CALCIUM 7.7* 7.5* 7.6* 7.3* 7.6*  AST  --  10*  --   --   --   ALT  --  7  --   --   --   ALKPHOS  --  78  --   --   --   BILITOT  --  0.4  --   --   --    ------------------------------------------------------------------------------------------------------------------ No results for input(s): CHOL, HDL, LDLCALC, TRIG, CHOLHDL, LDLDIRECT in the last 72 hours.  Lab Results  Component Value Date   HGBA1C 5.5 10/19/2019   ------------------------------------------------------------------------------------------------------------------ No results for input(s): TSH, T4TOTAL, T3FREE, THYROIDAB in the last 72 hours.  Invalid input(s):  FREET3 ------------------------------------------------------------------------------------------------------------------ No results for input(s): VITAMINB12, FOLATE, FERRITIN, TIBC, IRON, RETICCTPCT in the last 72 hours.  Coagulation profile No results for input(s): INR, PROTIME in the last 168 hours.  No results for input(s): DDIMER in the last 72 hours.  Cardiac Enzymes No results for input(s): CKMB, TROPONINI, MYOGLOBIN in the last 168 hours.  Invalid input(s): CK     Component Value Date/Time   BNP 583.7 (H) 11/01/2014 6168   Kathie Dike M.D on 11/12/2019 at 10:00 PM  Go to [email or web address] - for contact info  [facility] - Office  [phone]

## 2019-11-12 NOTE — Progress Notes (Signed)
[facility]  Patient admitted with bacteremia, severe sepsis with UTI and infected decubitus ulcers.  She has been in the hosptial now for about 27 days.  She remains minimally responsive and on D10 for hypoglycemia. She has had poor intake. She has had palliative consultation, and I performed debridement on her decubitus ulcers at the initial point in her hospitalization 10/17/2019.    The family had at one point said DNR, no plans for PEG or feeds, and then were questioning whether they wanted a PEG again. They have refused any hospice or full comfort care at this time as the husband says he had a bad experience with his 2 yo aunt who was in hospice and died.  She is unable to be placed in a SNF due to hypoglycemia and lack of intake.  She has a history of CHF with EF 25%, thrombocytopenic.    BP (!) 89/54 (BP Location: Left Wrist)   Pulse 73   Temp (!) 97.5 F (36.4 C) (Axillary)   Resp 18   Ht 5\' 4"  (1.626 m)   Wt 80.2 kg   SpO2 100%   BMI 30.35 kg/m   Opens eyes on occasion Non verbal  Spoke with husband at length about PEG, risk of PEG, need for anesthesia, risk of dislodgement, injury, bleeding, and fact that in advanced dementia PEG do not change the outcome of dying or add nutritional benefit in studies. There is a significant risk given her CHF, thrombocytopenia, and risk of pulling PEG and essentially minimal benefit. Discussed at length reason for D10 and that it is prolonging her life at this time. Discussed his goals and he wants her to be comfortable, to have her wounds changed, but does not want to "take away her time."    He says he converses with her and has asked her if she is ready to leave, but she did not answer.  He is here regularly and sits with her, and her children have been able to visit.  He prefers for her to dye here at [facility].   He is not ready to stop the D10.  I discussed that the dying process involves cessation of intake orally and  that the body is not wanting or needing the nutrition.   Husband is in agreement that he does not want a PEG and I would not recommend a PEG for the reasons above.  She is dying and he does understand this, but is not ready to stop the D10 and says his children are not ready to stop the D10. He does seem to understand that it is prolonging her life/ sustaining her life.  The husband is going to further think about the options at this time.   I spent 45 minute during this visit counseling/ coordination of care regarding PEG and risk of PEG and overall goals of care.   [facility], MD [facility] [address] [place], [place] [phone] (office)

## 2019-11-13 LAB — GLUCOSE, CAPILLARY
Glucose-Capillary: 104 mg/dL — ABNORMAL HIGH (ref 70–99)
Glucose-Capillary: 110 mg/dL — ABNORMAL HIGH (ref 70–99)
Glucose-Capillary: 113 mg/dL — ABNORMAL HIGH (ref 70–99)
Glucose-Capillary: 113 mg/dL — ABNORMAL HIGH (ref 70–99)
Glucose-Capillary: 97 mg/dL (ref 70–99)

## 2019-11-13 LAB — BASIC METABOLIC PANEL
Anion gap: 4 — ABNORMAL LOW (ref 5–15)
BUN: 23 mg/dL (ref 8–23)
CO2: 16 mmol/L — ABNORMAL LOW (ref 22–32)
Calcium: 7.4 mg/dL — ABNORMAL LOW (ref 8.9–10.3)
Chloride: 110 mmol/L (ref 98–111)
Creatinine, Ser: 1.67 mg/dL — ABNORMAL HIGH (ref 0.44–1.00)
GFR calc Af Amer: 33 mL/min — ABNORMAL LOW (ref >60)
GFR calc non Af Amer: 28 mL/min — ABNORMAL LOW (ref >60)
Glucose, Bld: 111 mg/dL — ABNORMAL HIGH (ref 70–99)
Potassium: 3.7 mmol/L (ref 3.5–5.1)
Sodium: 130 mmol/L — ABNORMAL LOW (ref 135–145)

## 2019-11-13 LAB — CBC
HCT: 27.3 % — ABNORMAL LOW (ref 36.0–46.0)
Hemoglobin: 8.5 g/dL — ABNORMAL LOW (ref 12.0–15.0)
MCH: 28.3 pg (ref 26.0–34.0)
MCHC: 31.1 g/dL (ref 30.0–36.0)
MCV: 91 fL (ref 80.0–100.0)
Platelets: 34 10*3/uL — ABNORMAL LOW (ref 150–400)
RBC: 3 MIL/uL — ABNORMAL LOW (ref 3.87–5.11)
RDW: 17.3 % — ABNORMAL HIGH (ref 11.5–15.5)
WBC: 3.4 10*3/uL — ABNORMAL LOW (ref 4.0–10.5)
nRBC: 0.9 % — ABNORMAL HIGH (ref 0.0–0.2)

## 2019-11-13 NOTE — Progress Notes (Signed)
Patient Demographics:    [name], is a 85 y.o. female, DOB - [birth year], SWV:[id number]  Admit date - 11/02/2019   Admitting Physician Ejiroghene Arlyce Dice, MD  Outpatient Primary MD for the patient is [name], MD  LOS - 28  Chief Complaint  Patient presents with  . Skin Ulcer       Subjective:   Remains unresponsive  Assessment  & Plan :    Principal Problem:   Severe sepsis with septic shock - Klebsiella/Proteus/E. coli/Bacteroides and Staph  Active Problems:   Hypertension   Type II diabetes mellitus (HCC)   Mobitz type 2 second degree atrioventricular block   S/P ICD (internal cardiac defibrillator) procedure, with upgrade to ICD/CRT-D  [employer]   Cardiomyopathy, ischemic EF 20 to 25 %/history of combined systolic and diastolic dysfunction CHF   Polymorphic ventricular tachycardia (HCC)   Sepsis (HCC)   Decubitus ulcer of right hip, stage 4 /Infected with E-coli and Proteus   Failure to thrive in adult   Stage I pressure ulcer of sacral region   Decubitus ulcer of left hip, stage 3/Infected with E-coli and Proteus   Bacteremia due to Gram-negative bacteria   Gram negative sepsis (HCC)   AKI (acute kidney injury) ([place])   Goals of care, counseling/discussion   Palliative care by specialist   Encounter for hospice care discussion   Hematochezia   Sepsis secondary to Klebsiella UTI ([place])   Anemia due to acute blood loss/rectal bleeding   Thrombocytopenia Acquired   Brief Summary 85 year old female with a history of advanced Alzheimer's dementia, CAD, H/o combined systolic and diastolic dysfunction CHF/HFrEF/ischemic cardiomyopathy with EF 20 to 25%, h/o torsades VT status post AICD, H/o HTN, DM2, HLD, admitted on 10/15/2019 from home with metabolic encephalopathy in the setting of presumed infection (leukocytosis and Lactic acid was 3.1) with multiple decubitus ulcers -Weaned off IV  Levophed on 10/22/19 -Requiring warming blanket from time to time ---Patient still unable to have oral intake due to advanced cognitive deficits--- she will spit food out, clenching her jaw and resist any attempt to feed her  --Pt has been made NPO for now as she is not awake or alert enough to safely feed by mouth. Husband has requested permission to be able to feed her by mouth when he is here visiting----Husband accepts aspiration risk -overall prognosis remains poor due to inability to take oral intake --On IV dextrose solution due to hypoglycemia risk as a result of lack of oral intake -Prognosis poor as patient is currently hypotensive.  Husband was updated  Assessment/Plan: 1)Severe sepsis with septic shock-- POA -Etiology is multifactorial:-Patient has Klebsiella UTI (urine cx 10/17/19), E. coli and Proteus wound decubitus ulcer/wound infection (wound cx from 10/23/2019 and 10/17/19) and staph epi and Bacteroides bacteremia from blood culture dated 10/28/2019 -Weaned off IV Levophed on 10/22/19 --Stopped vancomycin after 10/21/2019 -MRSA PCR negative on 10/20/2019 -Repeat blood culture from 10/21/2019 NGTD -Patient remains encephalopathic, at baseline she does have advanced dementia  with significant cognitive and memory deficits -WBC is down to 5.4  from a peak of 25.6 -PCT was  2.91, ESR 60, CRP 14.1 - overall prognosis remains poor especially given lack of oral intake -Antimicrobials/Anti-fungal this admission: - Diflucan 3/16 and 3/17  -Ceftriaxone 10/22/2019  through 10/28/2019 inclusive  --metronidazole -10/22/2019 through 10/26/2019 inclusive  --zosyn x 1 on 10/19/2019 -- cefepime 11/05/2019 through 10/22/19 -Vanc --10/10/2019 through 10/22/19 -Eraxis -10/25/2019 through 10/28/2019 inclusive  2)Bacteremia--B.fragilis and staph epi--- ???  Significance of blood culture from 10/11/2019 -??  Contaminant, however patient was septic --Sepsis pathophysiology and Antibiotics as above #1 -Repeat blood  cultures on 10/21/2019 NGTD - 3)Infected Bilateral Hip and Sacral decubitus ulcers--please see photos in epic -10/12/2019 CT abdomen/pelvis--right lateral decubitus ulcer with subcutaneous inflammatory changes extending toward the greater trochanter. There is no bony destructive changes. There is considerable inflammatory change with air tracking toward the lateral aspect of the femur. -Since wounds are bilateral, is difficult to position patient so the pressure is offloaded from wounds -General surgery consultappreciated -10/17/19--bedside debridement -Wound cultures from 10/27/2019 and 10/17/2019 with E. coli and Proteus --Wound culture with fungus reluctant to use Diflucan due to prolonged QT concerns---okay to treat with anidulafungin -Sepsis pathophysiology and completed antibiotics as above #1  4) Klebsiella UTI--urine culture from 10/17/2019 noted --Sepsis pathophysiology and completed antibiotics as above #1  5)Failure to thrive -Serum B12--199-->supplement -TSH--37.364, Free T4--0.28 -Folate--7.2 -PT evaluation-->SNF -continues to have poor po intake -Cognitive status and encephalopathy makes it difficult to feed orally ---Patient still unable to have oral intake due to advanced cognitive deficits--- she will spit food out, clenching her jaw and resist any attempt to feed her -Continue judicious IV dextrose fluids given that patient's EF is down to 20 to 25% -Low blood glucose despite IV dextrose infusion --> she has been transitioned to D10 infusion -She is developing significant anasarca due to third spacing IV fluids --Remains very challenging to feed patient as she closes her mouth , clenching her jaw and spits out anything ---Pt has been made NPO for now as she is not awake or alert enough to safely feed by mouth. Husband has requested permission to be able to feed her by mouth when he is here visiting--Husband accepts aspiration risk -Seen by Dr. Constance Haw for PEG tube placement.   It was felt that she would not be an appropriate candidate for PEG tube placement -Currently, patient remains hypotensive and overall prognosis is poor.   6)Symptomatic acute on chronic blood loss anemia due to Rectal bleeding- transfusion of 2 units of PRBC on 10/21/2019 and 1 unit of PRBC on 4/3 -No further rectal bleeding -Protonix for GI prophylaxis GI consult appreciated suspect rectal injury from fecal impaction--suppositories as ordered   7)CKD stage 3b -initially felt to be AKI but now more likely progression of CKD -pt likely has progression of underlying CKD -Creatinine hovering below 2 -No recent renal labs available to compare current values to -renal US-->medical renal disease -Creatinine is down to 1.5 from a peak of 1.83 - renally adjust medications, avoid nephrotoxic agents / dehydration  / hypotension -Very scant urine output in Foley bag -Foley was placed due to urinary retention  8)H/o combined systolic and diastolic dysfunction CHF/HFrEF/ischemic cardiomyopathy with EF 25-30% -BP improved with iVF     9)Hypernatremia/dehydration -Resolved with IV fluids, --On IV dextrose solution (D10) due to hypoglycemia risk as a result of lack of oral intake  10)Alzheimer's dementia/ambulatory dysfunction -Continue Namenda -spouse states pt screams frequently at home -At baseline patient has very advanced dementia with severe cognitive and memory deficits, she has been bed-bound for the last 3 months PTA, requiring total care  11)Coronary artery disease -hx of CABG in 2009 (LIMA-LAD, SVG-ramus, SVG-OM). Hx of RCA stent 04/2012 -no chest pain presently  Discontinued Aspirin and Plavix due to rectal bleeding with symptomatic Anemia -Continue statin -Coreg on hold due to hypotension  12)Social/Ethics--- overall prognosis is poor, patient is a DNR/DNI -Plan of care, goals of care discussed with patient's husband at bedside -Official palliative care consult  appreciated -Family declined comfort care and hospice at this time -Very advanced dementia with significant cognitive and memory deficits--- patient is total care   13)Thrombocytopenia---  Platelets 206 >> 188>>> 117>> 74>> 48>>> 38>>32>>27>>24>>18>>19>>28>>38>>35K.  -- Possibly related to underlying infectious process.  No signs of bleeding at this time.   --Consider transfusion (if family desires) if platelet count below 10K or if bleeding occurs  14)Ventricular Tachycardia/H/o Torsades -status post AICD placement - she has CRT-D device followed by EP - she underwent ICD upgrade of single chamber ICD to [employer] CRT-D  Disposition Plan: Patient From: Home D/C Place: Hospice at Home [facility]s [facility] Barriers: Not Clinically Stable--poor po intake, -Borderline blood glucose despite IV dextrose infusion --Remains very challenging to feed patient as she closes her mouth , clenching her jaw and spits out anything --Family did not want to pursue comfort care and hospice at this time --Seen by Gen Surgery and not felt to be a candidate for PEG tube -Will also need continued conversations with palliative care around prognosis  Code Status : DNR  Procedures-- bedside decubitus ulcer debridement on 10/17/2019 -Right arm PICC line placement on 10/21/2019 -transfusion of 2 units of PRBC on 10/21/2019 -Bedside decubitus debridement by Dr. Constance Haw on 10/22/2019  Family Communication:    Discussed with husband over the phone 4/6  Consults  : Palliative care and general surgery  DVT Prophylaxis  :  Teds and SCDs/rectal bleeding/low platelets  Lab Results  Component Value Date   PLT 34 (L) 11/13/2019   Inpatient Medications  Scheduled Meds: . Chlorhexidine Gluconate Cloth  6 each Topical Daily  . collagenase   Topical BID  . feeding supplement (ENSURE ENLIVE)  237 mL Oral TID  . hydrocortisone  25 mg Rectal BID  . levothyroxine  37.5 mcg Intravenous Q0600  . mouth rinse  15  mL Mouth Rinse BID  . pantoprazole (PROTONIX) IV  40 mg Intravenous Q24H  . polyethylene glycol  17 g Oral Daily  . sodium chloride flush  10-40 mL Intracatheter Q12H   Continuous Infusions: . sodium chloride 10 mL/hr at 10/21/19 2018  . dextrose 50 mL/hr at 11/13/19 0547   PRN Meds:.sodium chloride, acetaminophen **OR** acetaminophen, bisacodyl, fentaNYL (SUBLIMAZE) injection, LORazepam, ondansetron (ZOFRAN) IV, sodium chloride flush   Anti-infectives (From admission, onward)   Start     Dose/Rate Route Frequency Ordered Stop   10/25/19 1500  anidulafungin (ERAXIS) 100 mg in sodium chloride 0.9 % 100 mL IVPB     100 mg 78 mL/hr over 100 Minutes Intravenous Every 24 hours 10/24/19 1728 10/28/19 1803   10/24/19 1400  fluconazole (DIFLUCAN) IVPB 200 mg  Status:  Discontinued     200 mg 100 mL/hr over 60 Minutes Intravenous Every 24 hours 10/23/19 1144 10/24/19 1717   10/23/19 1400  fluconazole (DIFLUCAN) IVPB 400 mg     400 mg 100 mL/hr over 120 Minutes Intravenous  Once 10/23/19 1144 10/23/19 2302   10/22/19 2200  cefTRIAXone (ROCEPHIN) 2 g in sodium chloride 0.9 % 100 mL IVPB     2 g 200 mL/hr over 30 Minutes Intravenous Every 24 hours 10/22/19 1022 10/28/19 2259   10/17/19 2359  vancomycin (VANCOREADY) IVPB 1250 mg/250 mL  1,250 mg 166.7 mL/hr over 90 Minutes Intravenous Every 48 hours 10/25/2019 2022 10/22/19 2222   10/15/2019 2359  metroNIDAZOLE (FLAGYL) IVPB 500 mg  Status:  Discontinued     500 mg 100 mL/hr over 60 Minutes Intravenous Every 8 hours 10/24/2019 2047 10/26/19 1604   10/19/2019 2359  ceFEPIme (MAXIPIME) 2 g in sodium chloride 0.9 % 100 mL IVPB  Status:  Discontinued     2 g 200 mL/hr over 30 Minutes Intravenous Every 24 hours 10/14/2019 2012 10/22/19 1022   10/15/2019 1430  vancomycin (VANCOCIN) IVPB 1000 mg/200 mL premix     1,000 mg 200 mL/hr over 60 Minutes Intravenous  Once 10/25/2019 1428 10/20/2019 1713   10/13/2019 1430  piperacillin-tazobactam (ZOSYN) IVPB 3.375 g      3.375 g 100 mL/hr over 30 Minutes Intravenous  Once 11/05/2019 1428 10/24/2019 1607       Objective:   Vitals:   11/13/19 0605 11/13/19 1100 11/13/19 1408 11/13/19 1959  BP: (!) 97/59  (!) 68/44   Pulse: 72  64   Resp: 12  16   Temp:      TempSrc:      SpO2: 98% 96% 100% 99%  Weight:      Height:        Wt Readings from Last 3 Encounters:  10/22/19 80.2 kg  06/13/19 78.5 kg  10/19/18 73.6 kg    Intake/Output Summary (Last 24 hours) at 11/13/2019 2004 Last data filed at 11/13/2019 1900 Gross per 24 hour  Intake 1183.62 ml  Output 750 ml  Net 433.62 ml   Physical Exam  General exam: Somnolent, not responsive Respiratory system: Bilateral rhonchi. Respiratory effort normal. Cardiovascular system:RRR. No murmurs, rubs, gallops. Gastrointestinal system: Abdomen is nondistended, soft and nontender. No organomegaly or masses felt. Normal bowel sounds heard. Central nervous system: No focal neurological deficits. Extremities: Anasarca Skin: Bilateral hip ulcers Psychiatry: unresponsive       Data Review:   Micro Results No results found for this or any previous visit (from the past 240 hour(s)).  Radiology Reports CT ABDOMEN PELVIS WO CONTRAST  Result Date: 10/11/2019 CLINICAL DATA:  Decubitus ulcers EXAM: CT ABDOMEN AND PELVIS WITHOUT CONTRAST TECHNIQUE: Multidetector CT imaging of the abdomen and pelvis was performed following the standard protocol without IV contrast. COMPARISON:  None. FINDINGS: Lower chest: No acute abnormality. Hepatobiliary: Scattered calcifications are noted within the liver. Gallbladder is well distended with multiple dependent gallstones. Pancreas: Unremarkable. No pancreatic ductal dilatation or surrounding inflammatory changes. Spleen: Normal in size without focal abnormality. Adrenals/Urinary Tract: Adrenal glands are within normal limits. Kidneys are well visualized bilaterally with renal vascular calcifications. No renal stones are seen. No  obstructive changes are noted. The bladder is partially distended. Stomach/Bowel: The appendix is not well visualized although no inflammatory changes to suggest appendicitis are noted. Retained fecal material is noted throughout the colon consistent with a mild degree of constipation without obstructive change. No small bowel abnormality is seen. Stomach is unremarkable. Vascular/Lymphatic: Aortic atherosclerosis. No enlarged abdominal or pelvic lymph nodes. Reproductive: Uterus and bilateral adnexa are unremarkable. Other: No abdominal wall hernia or abnormality. No abdominopelvic ascites. Musculoskeletal: Mild changes of anasarca are noted. Decubitus ulcer is noted laterally near the right hip with inflammatory changes extending from the skin towards the greater trochanter. This area measures approximately 7.6 x 4.3 cm. No definitive bony erosive changes to suggest osteomyelitis are noted at this time. No other definitive decubitus ulcer is seen. Degenerative changes of lumbar spine are  seen. IMPRESSION: Right lateral decubitus ulcer with subcutaneous inflammatory changes extending towards the greater trochanter of the right proximal femur. No definitive bony destructive changes are seen. Considerable subcutaneous inflammatory changes noted with air tracking towards the lateral aspect of the femur. Changes of mild constipation. Cholelithiasis without complicating factors. Electronically Signed   By: Inez Catalina M.D.   On: 11/02/2019 16:51   US RENAL  Result Date: 10/18/2019 CLINICAL DATA:  Acute renal insufficiency. EXAM: RENAL / URINARY TRACT ULTRASOUND COMPLETE COMPARISON:  11/03/2019 CT FINDINGS: Right Kidney: Renal measurements: 9.3 x 4.4 x 5.8 cm = volume: 123 mL. Increased renal echogenicity. No hydronephrosis. Left Kidney: Renal measurements: 9.6 x 5.2 x 4.6 cm = volume: 119 mL. Increased renal echogenicity. No hydronephrosis. Bladder: Decompressed. Other: Trace perinephric edema/fluid likely relates  to renal insufficiency. Note is made of gallbladder sludge and stones including at 1.0 cm. IMPRESSION: 1. No hydronephrosis. Increased renal echogenicity, consistent with medical renal disease. 2. Cholelithiasis without acute cholecystitis. Electronically Signed   By: Abigail Miyamoto M.D.   On: 10/18/2019 11:51   DG Chest Port 1 View  Result Date: 10/09/2019 CLINICAL DATA:  Sepsis EXAM: PORTABLE CHEST 1 VIEW COMPARISON:  11/05/2014 FINDINGS: Stable positioning of a left-sided implanted cardiac device. Post CABG changes. Stable cardiomediastinal contours. No focal airspace consolidation, pleural effusion, or pneumothorax. IMPRESSION: No acute cardiopulmonary findings. Electronically Signed   By: Davina Poke D.O.   On: 10/17/2019 14:47   ECHOCARDIOGRAM COMPLETE  Result Date: 11/11/2019    ECHOCARDIOGRAM REPORT   Patient Name:   Foye Spurling Date of Exam: 11/11/2019 Medical Rec #:  [id number]    Height:       64.0 in Accession #:    [id number]   Weight:       176.8 lb Date of Birth:  09/10/1934    BSA:          1.857 m Patient Age:    28 years     BP:           98/72 mmHg Patient Gender: F            HR:           76 bpm. Exam Location:  [facility] Procedure: 2D Echo Indications:    Congestive Heart Failure 428.0 / I50.9  History:        Patient has prior history of Echocardiogram examinations, most                 recent 02/13/2015. CAD; Risk Factors:Hypertension, Diabetes and                 Dyslipidemia. Cardiomyopathy, ischemic EF 20 to 25 %/S/P ICD,                 Mobitz type 2 second degree atrioventricular block, Complete                 heart block.  Sonographer:    Leavy Cella RDCS (AE) Referring Phys: [place]  1. Severe global reduction in LV systolic function; moderate LVH; grade 2 diastolic dysfunction; moderate to severe MR; moderate LAE; moderate TR; mild pulmonary hypertension.  2. Left ventricular ejection fraction, by estimation, is 25 to 30%. The left ventricle has  severely decreased function. The left ventricle demonstrates global hypokinesis. There is moderate left ventricular hypertrophy. Left ventricular diastolic parameters are consistent with Grade II diastolic dysfunction (pseudonormalization). Elevated left atrial pressure.  3. Right ventricular systolic function is normal.  The right ventricular size is normal. There is mildly elevated pulmonary artery systolic pressure.  4. Left atrial size was moderately dilated.  5. The mitral valve is normal in structure. Moderate to severe mitral valve regurgitation. No evidence of mitral stenosis.  6. Tricuspid valve regurgitation is moderate.  7. The aortic valve is tricuspid. Aortic valve regurgitation is mild. Mild aortic valve sclerosis is present, with no evidence of aortic valve stenosis.  8. The inferior vena cava is normal in size with greater than 50% respiratory variability, suggesting right atrial pressure of 3 mmHg. FINDINGS  Left Ventricle: Left ventricular ejection fraction, by estimation, is 25 to 30%. The left ventricle has severely decreased function. The left ventricle demonstrates global hypokinesis. The left ventricular internal cavity size was normal in size. There is moderate left ventricular hypertrophy. Left ventricular diastolic parameters are consistent with Grade II diastolic dysfunction (pseudonormalization). Elevated left atrial pressure. Right Ventricle: The right ventricular size is normal. Right ventricular systolic function is normal. There is mildly elevated pulmonary artery systolic pressure. The tricuspid regurgitant velocity is 2.93 m/s, and with an assumed right atrial pressure of 3 mmHg, the estimated right ventricular systolic pressure is 31.5 mmHg. Left Atrium: Left atrial size was moderately dilated. Right Atrium: Right atrial size was normal in size. Pericardium: There is no evidence of pericardial effusion. Mitral Valve: The mitral valve is normal in structure. Normal mobility of the  mitral valve leaflets. Moderate to severe mitral valve regurgitation. No evidence of mitral valve stenosis. Tricuspid Valve: The tricuspid valve is normal in structure. Tricuspid valve regurgitation is moderate . No evidence of tricuspid stenosis. Aortic Valve: The aortic valve is tricuspid. Aortic valve regurgitation is mild. Aortic regurgitation PHT measures 379 msec. Mild aortic valve sclerosis is present, with no evidence of aortic valve stenosis. Pulmonic Valve: The pulmonic valve was normal in structure. Pulmonic valve regurgitation is trivial. No evidence of pulmonic stenosis. Aorta: The aortic root is normal in size and structure. Venous: The inferior vena cava is normal in size with greater than 50% respiratory variability, suggesting right atrial pressure of 3 mmHg.  Additional Comments: Severe global reduction in LV systolic function; moderate LVH; grade 2 diastolic dysfunction; moderate to severe MR; moderate LAE; moderate TR; mild pulmonary hypertension. A pacer wire is visualized.  LEFT VENTRICLE PLAX 2D LVIDd:         4.42 cm  Diastology LVIDs:         3.76 cm  LV e' lateral:   2.80 cm/s LV PW:         1.39 cm  LV E/e' lateral: 57.1 LV IVS:        1.17 cm  LV e' medial:    3.33 cm/s LVOT diam:     1.90 cm  LV E/e' medial:  48.0 LVOT Area:     2.84 cm  RIGHT VENTRICLE RV S prime:     8.59 cm/s TAPSE (M-mode): 1.7 cm LEFT ATRIUM             Index LA diam:        4.60 cm 2.48 cm/m LA Vol (A2C):   84.2 ml 45.35 ml/m LA Vol (A4C):   78.6 ml 42.34 ml/m LA Biplane Vol: 82.1 ml 44.22 ml/m  AORTIC VALVE AI PHT:      379 msec  AORTA Ao Root diam: 2.60 cm MITRAL VALVE                 TRICUSPID VALVE MV Area (PHT): 4.39  cm      TR Peak grad:   34.3 mmHg MV Decel Time: 173 msec      TR Vmax:        293.00 cm/s MR Peak grad:    105.3 mmHg MR Mean grad:    65.0 mmHg   SHUNTS MR Vmax:         513.00 cm/s Systemic Diam: 1.90 cm MR Vmean:        375.0 cm/s MR PISA:         4.02 cm MR PISA Eff ROA: 24 mm MR PISA  Radius:  0.80 cm MV E velocity: 160.00 cm/s MV A velocity: 138.00 cm/s MV E/A ratio:  1.16 Kirk Ruths MD Electronically signed by Kirk Ruths MD Signature Date/Time: 11/11/2019/12:10:35 PM    Final    [place] EKG SITE RITE  Result Date: 10/20/2019 If Site Rite image not attached, placement could not be confirmed due to current cardiac rhythm.   CBC Recent Labs  Lab 11/08/19 0807 11/10/19 0800 11/11/19 0528 11/12/19 0410 11/13/19 0804  WBC 4.3 2.9* 3.2* 3.2* 3.4*  HGB 7.4* 6.7* 7.7* 7.6* 8.5*  HCT 24.2* 21.5* 24.7* 24.9* 27.3*  PLT 37* 38* 34* 35* 34*  MCV 96.4 94.3 91.8 91.9 91.0  MCH 29.5 29.4 28.6 28.0 28.3  MCHC 30.6 31.2 31.2 30.5 31.1  RDW 17.6* 17.3* 17.7* 17.6* 17.3*    Chemistries  Recent Labs  Lab 11/09/19 0439 11/10/19 0800 11/11/19 0528 11/12/19 0410 11/13/19 0804  NA 137 135 128* 133* 130*  K 3.7 3.7 3.6 3.6 3.7  CL 115* 113* 106 112* 110  CO2 17* 17* 16* 17* 16*  GLUCOSE 92 95 541* 114* 111*  BUN '23 23 22 22 23  ' CREATININE 1.42* 1.56* 1.49* 1.59* 1.67*  CALCIUM 7.5* 7.6* 7.3* 7.6* 7.4*  AST 10*  --   --   --   --   ALT 7  --   --   --   --   ALKPHOS 78  --   --   --   --   BILITOT 0.4  --   --   --   --    ------------------------------------------------------------------------------------------------------------------ No results for input(s): CHOL, HDL, LDLCALC, TRIG, CHOLHDL, LDLDIRECT in the last 72 hours.  Lab Results  Component Value Date   HGBA1C 5.5 10/19/2019   ------------------------------------------------------------------------------------------------------------------ No results for input(s): TSH, T4TOTAL, T3FREE, THYROIDAB in the last 72 hours.  Invalid input(s): FREET3 ------------------------------------------------------------------------------------------------------------------ No results for input(s): VITAMINB12, FOLATE, FERRITIN, TIBC, IRON, RETICCTPCT in the last 72 hours.  Coagulation profile No results for input(s):  INR, PROTIME in the last 168 hours.  No results for input(s): DDIMER in the last 72 hours.  Cardiac Enzymes No results for input(s): CKMB, TROPONINI, MYOGLOBIN in the last 168 hours.  Invalid input(s): CK     Component Value Date/Time   BNP 583.7 (H) 11/01/2014 8208   Kathie Dike M.D on 11/13/2019 at 8:04 PM  Go to [email or web address] - for contact info  [facility] - Office  [phone]

## 2019-11-13 NOTE — Progress Notes (Signed)
Patient BP 68/44. MEWS 4, MD notified and [facility] contacted. Patient was rounded on and assessed by this RN, charge RN, and [facility]. Manual BP check 70/40, however difficult to hear due to level of edema. Radial and pedal pulses palpable. Patient had no change in responsiveness from what she's been during shift. No new orders placed, MD will round on patient and speak with husband.

## 2019-11-13 NOTE — Plan of Care (Signed)

## 2019-11-13 NOTE — Progress Notes (Signed)
Daily Progress Note   Patient Name: [name]       Date: 11/13/2019 DOB: 1935/08/02  Age: 85 y.o. MRN#: [id number] Attending Physician: Erick Blinks, MD Primary Care Physician: [name], MD Admit Date: 11/03/2019  Reason for Consultation/Follow-up: Establishing goals of care  Subjective: Evaluated patient. She is in bed- has copious oral secretions. She opens her eyes to my voice. No verbal response. Brow is furrowed. She continues to decline food and drink- now requiring D10 to maintain glucose.  Her husband Abby is at bedside. Had extensive discussion regarding goals of care. Abby shared his deep faith- he noted that Dr. Kerry Hough had discussed stopping IV fluids with him, which would likely lead to [name]'s eventual death.  Abby shared his belief that God will tell him when it is time to stop the IV fluids.  We discussed what Abby's hopes would be for [name]- Abby shared his stories of praying and laying hands on people and seeing them healed.  We discussed that sometimes God heals [place] in a different way- by bringing [place] to him. Discussed that sometimes earthly medical interventions can interfere with that.  Abby was appreciative of our discussion. He noted that it had crossed his mind that by continuing with aggressive medical care he was interfering with God's plan.    Review of Systems  Unable to perform ROS: Dementia    Length of Stay: 28  Current Medications: Scheduled Meds:  . Chlorhexidine Gluconate Cloth  6 each Topical Daily  . collagenase   Topical BID  . feeding supplement (ENSURE ENLIVE)  237 mL Oral TID  . hydrocortisone  25 mg Rectal BID  . levothyroxine  37.5 mcg Intravenous Q0600  . mouth rinse  15 mL Mouth Rinse BID  . pantoprazole (PROTONIX) IV  40 mg  Intravenous Q24H  . polyethylene glycol  17 g Oral Daily  . sodium chloride flush  10-40 mL Intracatheter Q12H    Continuous Infusions: . sodium chloride 10 mL/hr at 10/21/19 2018  . dextrose 50 mL/hr at 11/13/19 0547    PRN Meds: sodium chloride, acetaminophen **OR** acetaminophen, bisacodyl, fentaNYL (SUBLIMAZE) injection, LORazepam, ondansetron (ZOFRAN) IV, sodium chloride flush  Physical Exam Vitals and nursing note reviewed.  HENT:     Mouth/Throat:     Comments: Copious secretions Cardiovascular:  Comments: Diffuse anasarca Neurological:     Comments: No verbal responses             Vital Signs: BP (!) 97/59 (BP Location: Left Wrist)   Pulse 72   Temp 98.9 F (37.2 C)   Resp 12   Ht 5\' 4"  (1.626 m)   Wt 80.2 kg   SpO2 96%   BMI 30.35 kg/m  SpO2: SpO2: 96 % O2 Device: O2 Device: Room Air O2 Flow Rate:    Intake/output summary:   Intake/Output Summary (Last 24 hours) at 11/13/2019 1230 Last data filed at 11/13/2019 1000 Gross per 24 hour  Intake 1183.62 ml  Output 250 ml  Net 933.62 ml   LBM: Last BM Date: 11/09/19 Baseline Weight: Weight: 72 kg Most recent weight: Weight: 80.2 kg       Palliative Assessment/Data:m PPS: 10%    Flowsheet Rows     Most Recent Value  Intake Tab  Referral Department  Hospitalist  Unit at Time of Referral  Cardiac/Telemetry Unit  Palliative Care Primary Diagnosis  Other (Comment)  Date Notified  10/17/19  Palliative Care Type  New Palliative care  Reason for referral  Clarify Goals of Care  Date of Admission  10-17-2019  Date first seen by Palliative Care  10/18/19  # of days Palliative referral response time  1 Day(s)  # of days IP prior to Palliative referral  1  Clinical Assessment  Palliative Performance Scale Score  30%  Pain Max last 24 hours  Not able to report  Pain Min Last 24 hours  Not able to report  Dyspnea Max Last 24 Hours  Not able to report  Dyspnea Min Last 24 hours  Not able to report   Psychosocial & Spiritual Assessment  Palliative Care Outcomes      Patient Active Problem List   Diagnosis Date Noted  . Thrombocytopenia Acquired 10/25/2019  . Severe sepsis with septic shock - Klebsiella/Proteus/E. coli/Bacteroides and Staph  10/21/2019  . Sepsis secondary to Klebsiella UTI (HCC) 10/21/2019  . Anemia due to acute blood loss/rectal bleeding 10/21/2019  . Hematochezia 10/20/2019  . Bacteremia due to Gram-negative bacteria 10/18/2019  . Gram negative sepsis (HCC) 10/18/2019  . AKI (acute kidney injury) (HCC)   . Goals of care, counseling/discussion   . Palliative care by specialist   . Encounter for hospice care discussion   . Sepsis due to undetermined organism (HCC) 10/17/2019  . Failure to thrive in adult 10/17/2019  . Decubitus ulcer of left hip, stage 3/Infected with E-coli and Proteus 10/17/2019  . Stage I pressure ulcer of sacral region   . Sepsis (HCC) 2019-10-17  . Decubitus ulcer of right hip, stage 4 /Infected with E-coli and Proteus 10-17-19  . Polymorphic ventricular tachycardia (HCC) 08/15/2015  . S/P ICD (internal cardiac defibrillator) procedure, with upgrade to ICD/CRT-D  Medtronic 11/06/2014  . Cardiomyopathy, ischemic EF 20 to 25 %/history of combined systolic and diastolic dysfunction CHF 11/06/2014  . Weakness 11/06/2014  . AICD (automatic cardioverter/defibrillator) present   . Torsades de pointes with 3 shocks, another pace terminated 10/31/2014  . Mobitz type 2 second degree atrioventricular block 10/31/2014  . Complete heart block (HCC) 10/31/2014  . CAD (coronary artery disease) 04/13/2012  . Acute systolic CHF (congestive heart failure) (HCC) 04/12/2012  . Hypertension   . High cholesterol   . Type II diabetes mellitus [facility])     Palliative Care Assessment & Plan   Patient Profile: 85 yo female with PMH  significant for Alzheimer's dementia, bedridden, CAD, systolic CHF (EF 89-21%), torsades s/p Medtronic AICD, hypertension,  diabetes admitted 10-24-19 with decubitus ulcers and poor oral intake with sepsis UTI/wound infection/potential bacteremia, acute kidney injury, anemia and overall failure to thrive with poor intake. Per notes family have decided against feeding tube and do not desire hospice.Hypothermiahas resolvedand thrombocytopeniais slowly improving.Overall adult failure to thrive with underlying illness and poor recovery from acute illness.Palliative medicine consulted for assistance with goals of care.   Assessment/Recommendations/Plan   PMT will continue to discuss goals of care- patient's husband very receptive to discussion today- will try and arrange discussion with patient's children also present  Patient appears to be at end of life- as she is not eating or drinking, and no plans for other means of nutrition- at this point we are only prolonging her dying by utilizing IV fluids.   Goals of Care and Additional Recommendations:  Limitations on Scope of Treatment: No Artificial Feeding  Code Status:  DNR  Prognosis:   Unable to determine  Discharge Planning:  To Be Determined  Care plan was discussed with patient's spouse.   Thank you for allowing the Palliative Medicine Team to assist in the care of this patient.   Time In: 1045 Time Out: 1130 Total Time 45 mins Prolonged Time Billed no      Greater than 50%  of this time was spent counseling and coordinating care related to the above assessment and plan.  Mariana Kaufman, AGNP-C Palliative Medicine   Please contact Palliative Medicine Team phone at [phone] for questions and concerns.

## 2019-11-14 DIAGNOSIS — F0390 Unspecified dementia without behavioral disturbance: Secondary | ICD-10-CM

## 2019-11-14 LAB — GLUCOSE, CAPILLARY
Glucose-Capillary: 118 mg/dL — ABNORMAL HIGH (ref 70–99)
Glucose-Capillary: 118 mg/dL — ABNORMAL HIGH (ref 70–99)
Glucose-Capillary: 132 mg/dL — ABNORMAL HIGH (ref 70–99)
Glucose-Capillary: 104 mg/dL — ABNORMAL HIGH (ref 70–99)

## 2019-11-14 NOTE — Progress Notes (Addendum)
Daily Progress Note   Patient Name: [name]       Date: 11/14/2019 DOB: 06-28-1935  Age: 85 y.o. MRN#: [id number] Attending Physician: Shon Hale, MD Primary Care Physician: [name], MD Admit Date: 10/31/2019  Reason for Consultation/Follow-up: Establishing goals of care  Subjective: Patient continues to be nonresponsive, unable to eat or drink. I was present and assisted with Dr. Mariea Clonts as we discussed patient's poor prognosis and the fact that providers had reached the limits in [facility] medicine that could improve patient's status. IV fluids are not correcting any of patient's overarching problems, and in fact she is having increasing anasarca due to fluid volume, indicating at this point IV fluids are causing harm.  Multiple attempts by varying providers have been made to assist patient's spouse in coming to agreement with a comfort focused plan of care.  Given that there is no plan for feeding tube or other means of providing nutrition- it is inappropriate to continue IV fluids at this point as this is not a permanent way of maintaining. Continuing IV fluids is prolonging suffering and the dying process.  We discussed that if God is going to heal the patient- as her spouse hopes- then he will do it regardless of giving her IV fluids or not.   Review of Systems  Unable to perform ROS: Patient nonverbal    Length of Stay: 29  Current Medications: Scheduled Meds:  . Chlorhexidine Gluconate Cloth  6 each Topical Daily  . collagenase   Topical BID  . feeding supplement (ENSURE ENLIVE)  237 mL Oral TID  . hydrocortisone  25 mg Rectal BID  . levothyroxine  37.5 mcg Intravenous Q0600  . mouth rinse  15 mL Mouth Rinse BID  . pantoprazole (PROTONIX) IV  40 mg Intravenous Q24H    . polyethylene glycol  17 g Oral Daily  . sodium chloride flush  10-40 mL Intracatheter Q12H    Continuous Infusions: . sodium chloride 10 mL/hr at 10/21/19 2018  . dextrose 30 mL/hr at 11/14/19 1102    PRN Meds: sodium chloride, acetaminophen **OR** acetaminophen, bisacodyl, fentaNYL (SUBLIMAZE) injection, LORazepam, ondansetron (ZOFRAN) IV, sodium chloride flush  Physical Exam Vitals and nursing note reviewed.  Constitutional:      Appearance: She is ill-appearing.  Neurological:     Comments: Nonverbal, nonresponsive  Vital Signs: BP 100/71 (BP Location: Left Arm)   Pulse 71   Temp 97.9 F (36.6 C) (Oral)   Resp 19   Ht 5\' 4"  (1.626 m)   Wt 80.2 kg   SpO2 100%   BMI 30.35 kg/m  SpO2: SpO2: 100 % O2 Device: O2 Device: Room Air O2 Flow Rate:    Intake/output summary:   Intake/Output Summary (Last 24 hours) at 11/14/2019 1359 Last data filed at 11/14/2019 01/14/2020 Gross per 24 hour  Intake 0 ml  Output 700 ml  Net -700 ml   LBM: Last BM Date: 11/09/19 Baseline Weight: Weight: 72 kg Most recent weight: Weight: 80.2 kg       Palliative Assessment/Data: PPS: 10%    Flowsheet Rows     Most Recent Value  Intake Tab  Referral Department  Hospitalist  Unit at Time of Referral  Cardiac/Telemetry Unit  Palliative Care Primary Diagnosis  Other (Comment)  Date Notified  10/17/19  Palliative Care Type  New Palliative care  Reason for referral  Clarify Goals of Care  Date of Admission  10/18/2019  Date first seen by Palliative Care  10/18/19  # of days Palliative referral response time  1 Day(s)  # of days IP prior to Palliative referral  1  Clinical Assessment  Palliative Performance Scale Score  30%  Pain Max last 24 hours  Not able to report  Pain Min Last 24 hours  Not able to report  Dyspnea Max Last 24 Hours  Not able to report  Dyspnea Min Last 24 hours  Not able to report  Psychosocial & Spiritual Assessment  Palliative Care Outcomes       Patient Active Problem List   Diagnosis Date Noted  . Thrombocytopenia Acquired 10/25/2019  . Severe sepsis with septic shock - Klebsiella/Proteus/E. coli/Bacteroides and Staph  10/21/2019  . Sepsis secondary to Klebsiella UTI (HCC) 10/21/2019  . Anemia due to acute blood loss/rectal bleeding 10/21/2019  . Hematochezia 10/20/2019  . Bacteremia due to Gram-negative bacteria 10/18/2019  . Gram negative sepsis (HCC) 10/18/2019  . AKI (acute kidney injury) (HCC)   . Goals of care, counseling/discussion   . Palliative care by specialist   . Encounter for hospice care discussion   . Sepsis due to undetermined organism (HCC) 10/17/2019  . Failure to thrive in adult 10/17/2019  . Decubitus ulcer of left hip, stage 3/Infected with E-coli and Proteus 10/17/2019  . Stage I pressure ulcer of sacral region   . Sepsis (HCC) October 18, 2019  . Decubitus ulcer of right hip, stage 4 /Infected with E-coli and Proteus 2019-10-18  . Polymorphic ventricular tachycardia (HCC) 08/15/2015  . S/P ICD (internal cardiac defibrillator) procedure, with upgrade to ICD/CRT-D  Medtronic 11/06/2014  . Cardiomyopathy, ischemic EF 20 to 25 %/history of combined systolic and diastolic dysfunction CHF 11/06/2014  . Weakness 11/06/2014  . AICD (automatic cardioverter/defibrillator) present   . Torsades de pointes with 3 shocks, another pace terminated 10/31/2014  . Mobitz type 2 second degree atrioventricular block 10/31/2014  . Complete heart block (HCC) 10/31/2014  . CAD (coronary artery disease) 04/13/2012  . Acute systolic CHF (congestive heart failure) (HCC) 04/12/2012  . Hypertension   . High cholesterol   . Type II diabetes mellitus [facility])     Palliative Care Assessment & Plan   Patient Profile: 85 yo female with PMH significant for Alzheimer's dementia, bedridden, CAD, systolic CHF (EF 97), torsades s/p Medtronic AICD, hypertension, diabetes admitted 2019-10-18 with decubitus ulcers and poor oral  intake  with sepsis UTI/wound infection/potential bacteremia, acute kidney injury, anemia and overall failure to thrive with poor intake. Per notes family have decided against feeding tube and do not desire hospice.Hypothermiahas resolvedand thrombocytopeniais slowly improving.Overall adult failure to thrive with underlying illness and poor recovery from acute illness.Palliative medicine consulted for assistance with goals of care.   Assessment/Recommendations/Plan   Dr. Rosalva Ferron to consult ethics committee and request invoking futile care policy  Patient is not improving despite all provided medical interventions- family has made decision for no feeding tube- to continue IV fluids in the setting of diffuse anasarca is causing more harm than benefit at this point. Family is not in agreement with stopping IV fluids.   Goals of Care and Additional Recommendations: Limitations on Scope of Treatment: No escalation of care, no further labs  Code Status:  DNR  Prognosis:   < 2 weeks  Discharge Planning:  To Be Determined  Care plan was discussed with patient's spouse, Dr. Rosalva Ferron and Alfonso Patten  Thank you for allowing the Palliative Medicine Team to assist in the care of this patient.   Time In: 1000 Time Out: 1035 Total Time 35 minutes Prolonged Time Billed no      Greater than 50%  of this time was spent counseling and coordinating care related to the above assessment and plan.  Mariana Kaufman, AGNP-C Palliative Medicine   Please contact Palliative Medicine Team phone at [phone] for questions and concerns.

## 2019-11-14 NOTE — Progress Notes (Addendum)
      Remains unresponsive -[name] (palliative care) I had a long and detailed full conference with patient and spouse -Explained that despite 1 month of aggressive management patient's prognosis remains poor -Our testing and investigations appear to be adding to patient's discomfort without really benefiting patient in a meaningful way -At this time our actions appears to be futile -Patient still is without oral intake due to very advanced dementia with severe cognitive deficits -Patient has been on dextrose solution for awhile now to prevent recurrent hypoglycemia -Evaluated by general surgery and deemed not to be a candidate for PEG tube placement, -Thrombocytopenia persist -Overall prognosis remains poor -Kasie and I informed patient's husband that our aggressive treatment and interventions have not substantially changed patient's overall prognosis -Given the futility of our actions we will proceed with an ethics consult to determine what is in the best interest of Mrs Kalifa Cadden --Husband appears to be unhappy with the conversation about the futility of our treatment and interventions  overall prognosis remains poor due to inability to take oral intake --On IV dextrose solution due to hypoglycemia risk as a result of lack of oral intake    Phone conference with Dr. Idamae Schuller--- the on -call Physician for the Ethics Team at  cell [phone] Chaplain Ms. Leola Brazil also present for phone conference  --Case reviewed and discussed -Need to stop IV dextrose solution as this is futile treatment discussed  - Official ethics consult requested -Dr.  Santiago Bumpers Hensell-will be available to meet with family on Friday, 11/16/2019 in a.m---- to explained the futility clause and discuss further plan of care for Ms Bernadene Person    I thank chaplain Delford Field and  Dr Rushie Chestnut the ethics committee for their input  Shon Hale, MD

## 2019-11-14 NOTE — Progress Notes (Signed)
Patient Demographics:    [name], is a 85 y.o. female, DOB - 12/30/1934, IXB:[id number]  Admit date - 11/04/2019   Admitting Physician Ejiroghene Arlyce Dice, MD  Outpatient Primary MD for the patient is [name], MD  LOS - 29  Chief Complaint  Patient presents with  . Skin Ulcer       Subjective:   Remains unresponsive -Mariana Kaufman (palliative care) I had a long and detailed full conference with patient and spouse -Explained that despite 1 month of aggressive management patient's prognosis remains poor -Our testing and investigations appear to be adding to patient's discomfort without really benefiting patient in a meaningful way -At this time our actions appears to be futile -Patient still is without oral intake due to very advanced dementia with severe cognitive deficits -Patient has been on dextrose solution for awhile now to prevent recurrent hypoglycemia -Evaluated by general surgery and deemed not to be a candidate for PEG tube placement, -Thrombocytopenia persist -Overall prognosis remains poor -Kasie and  I informed patient's husband that our aggressive  treatment and interventions have not substantially changed patient's overall prognosis -Given the futility of our actions we will proceed with an ethics consult to determine what is in the best interest of [name]  --Husband appears to be unhappy with the conversation about the futility of our treatment and interventions  Assessment  & Plan :    Principal Problem:   Severe sepsis with septic shock - Klebsiella/Proteus/E. coli/Bacteroides and Staph  Active Problems:   S/P ICD (internal cardiac defibrillator) procedure, with upgrade to ICD/CRT-D  [employer]   Cardiomyopathy, ischemic EF 20 to 25 %/history of combined systolic and diastolic dysfunction CHF   Decubitus ulcer of right hip, stage 4 /Infected with E-coli and Proteus  Failure to thrive in adult   Decubitus ulcer of left hip, stage 3/Infected with E-coli and Proteus   Goals of care, counseling/discussion   Hematochezia   Sepsis secondary to Klebsiella UTI ([place])   Anemia due to acute blood loss/rectal bleeding   Type II diabetes mellitus ([place])   AKI (acute kidney injury) ([place])   Thrombocytopenia Acquired   Hypertension   Mobitz type 2 second degree atrioventricular block   Polymorphic ventricular tachycardia ([facility])   Sepsis ([place])   Stage I pressure ulcer of sacral region   Bacteremia due to Gram-negative bacteria   Gram negative sepsis ([place])   Palliative care by specialist   Encounter for hospice care discussion   Brief Summary 85 year old female with a history of advanced Alzheimer's dementia, CAD, H/o combined systolic and diastolic dysfunction CHF/HFrEF/ischemic cardiomyopathy with EF 20 to 25%, h/o torsades VT status post AICD, H/o HTN, DM2, HLD, admitted on 10/22/2019 from home with metabolic encephalopathy in the setting of presumed infection (leukocytosis and Lactic acid was 3.1) with multiple decubitus ulcers -Weaned off IV Levophed on 10/22/19 -Requiring warming blanket from time to time ---Patient still unable to have oral intake due to advanced cognitive deficits--- she will spit food out, clenching her jaw and resist any attempt to feed her  --Pt has been made NPO for now as she is not awake or alert enough to safely feed by mouth. Husband has requested permission to be able to feed her by mouth when  he is here visiting----Husband accepts aspiration risk -overall prognosis remains poor due to inability to take oral intake --On IV dextrose solution due to hypoglycemia risk as a result of lack of oral intake - 11/14/2019---- Remains unresponsive -Mariana Kaufman (palliative care) I had a long and detailed  conference with patient's spouse -Explained that despite 1 month of aggressive management patient's prognosis remains poor -Our testing and  investigations appear to be adding to patient's discomfort without really benefiting patient in a meaningful way -At this time our actions appears to be futile -Patient is still is without oral intake due to very advanced dementia with severe cognitive deficits -Patient has been on dextrose solution for awhile now to prevent recurrent hypoglycemia -Evaluated by general surgery and deemed not to be a candidate for PEG tube placement, -Thrombocytopenia persist -Overall prognosis remains poor -Kasie and  I informed patient's husband that our aggressive  treatment and interventions have not substantially changed patient's overall prognosis -Given the futility of our actions we will proceed with an ethics consult to determine what is in the best interest of Kelly [name]  --Husband appears to be unhappy with the conversation about the futility of our treatment and interventions   Assessment/Plan: 1)Severe Sepsis with Septic shock-- POA -Etiology is multifactorial:-Patient has Klebsiella UTI (urine cx 10/17/19), E. coli and Proteus wound decubitus ulcer/wound infection (wound cx from 10/14/2019 and 10/17/19) and staph epi and Bacteroides bacteremia from blood culture dated 10/17/2019 -Weaned off IV Levophed on 10/22/19 --Stopped vancomycin after 10/21/2019 -MRSA PCR negative on 10/20/2019 -Repeat blood culture from 10/21/2019 NGTD -Patient remains encephalopathic, at baseline she does have advanced dementia  with significant cognitive and memory deficits -WBC is down to 3.4  from a peak of 25.6 -PCT was  2.91, ESR 60, CRP 14.1 - overall prognosis remains poor especially given lack of oral intake -Antimicrobials/Anti-fungal this admission: - Diflucan 3/16 and 3/17  -Ceftriaxone 10/22/2019 through 10/28/2019 inclusive  --metronidazole -10/29/2019 through 10/26/2019 inclusive  --zosyn x 1 on 10/23/2019 -- cefepime 11/04/2019 through 10/22/19 -Vanc --10/22/2019 through 10/22/19 -Eraxis -10/25/2019 through 10/28/2019  inclusive  2)Bacteremia--B.fragilis and staph epi--- ???  Significance of blood culture from 10/10/2019 -??  Contaminant, however patient was septic --Sepsis pathophysiology resolved and Antibiotics as above #1 -Repeat blood cultures on 10/21/2019 NGTD - 3)Infected Bilateral Hip and Sacral decubitus ulcers--please see photos in epic -10/08/2019 CT abdomen/pelvis--right lateral decubitus ulcer with subcutaneous inflammatory changes extending toward the greater trochanter. There is no bony destructive changes. There is considerable inflammatory change with air tracking toward the lateral aspect of the femur. -Since wounds are bilateral, is difficult to position patient so the pressure is offloaded from wounds -General surgery consultappreciated -10/17/19--bedside debridement -Wound cultures from 11/04/2019 and 10/17/2019 with E. coli and Proteus --Wound culture with fungus reluctant to use Diflucan due to prolonged QT concerns--treated  with anidulafungin -Sepsis pathophysiology resolved and completed antibiotics as above #1  4) Klebsiella UTI--urine culture from 10/17/2019 noted --Sepsis pathophysiology and completed antibiotics as above #1  5)Failure to thrive -Serum B12--199-->supplement -TSH--37.364, Free T4--0.28 -Folate--7.2 -PT evaluation-->SNF -continues to have poor po intake -Cognitive status and encephalopathy makes it difficult to feed orally ---Patient still unable to have oral intake due to advanced cognitive deficits--- she will spit food out, clenching her jaw and resist any attempt to feed her -Continue judicious IV dextrose fluids given that patient's EF is down to 20 to 25% -Low blood glucose despite IV dextrose infusion --> she has been transitioned to D10 infusion -She  is developing significant anasarca due to third spacing IV fluids --Remains very challenging to feed patient as she closes her mouth , clenching her jaw and spits out anything ---Pt has been made NPO for  now as she is not awake or alert enough to safely feed by mouth. Husband has requested permission to be able to feed her by mouth when he is here visiting--Husband accepts aspiration risk -Seen by Dr. Constance Haw for PEG tube placement.  It was felt that she would not be an appropriate candidate for PEG tube placement -Currently, patient remains hypotensive and overall prognosis is poor.   6)Symptomatic acute on chronic blood loss anemia due to Rectal bleeding- transfusion of 2 units of PRBC on 10/21/2019 and 1 unit of PRBC on 4/3 -No further rectal bleeding -Protonix for GI prophylaxis -Hemoglobin currently above 8 GI consult appreciated suspect rectal injury from fecal impaction--suppositories as ordered   7)CKD stage 3b -initially felt to be AKI but now more likely progression of CKD -pt likely has progression of underlying CKD -Creatinine hovering below 2 -No recent renal labs available (prior to admission ) to compare current values to -renal US-->medical renal disease -Creatinine is down to 1.67 from a peak of 1.83 - renally adjust medications, avoid nephrotoxic agents / dehydration  / hypotension -Very scant urine output in Foley bag -Foley was placed due to urinary retention  8)H/o combined systolic and diastolic dysfunction CHF/HFrEF/ischemic cardiomyopathy with EF 25-30% -BP improved with iVF    9)Hypernatremia/dehydration -Sodium trended down with dextrose solution --On IV dextrose solution (D10) due to hypoglycemia risk as a result of lack of oral intake  10)Alzheimer's dementia/ambulatory dysfunction -Continue Namenda -spouse states pt screams frequently at home -At baseline patient has very advanced dementia with severe cognitive and memory deficits, she has been bed-bound for the last 3 months PTA, requiring total care  11)Coronary artery disease -hx of CABG in 2009 (LIMA-LAD, SVG-ramus, SVG-OM). Hx of RCA stent 04/2012 -no chest pain presently Discontinued Aspirin  and Plavix due to rectal bleeding with symptomatic Anemia -Continue statin -Coreg on hold due to hypotension  12)Social/Ethics--- overall prognosis is poor, patient is a DNR/DNI -Plan of care, goals of care discussed with patient's husband at bedside -Official palliative care consult appreciated -Family declined comfort care and hospice at this time -Very advanced dementia with significant cognitive and memory deficits--- patient is total care   13)Thrombocytopenia---  Platelets 206 >> 188>>> 117>> 74>> 48>>> 38>>32>>27>>24>>18>>19>>28>>38>>35K >>34  -- Possibly related to underlying infectious process.  No signs of bleeding at this time.   --Consider transfusion (if family desires) if platelet count below 10K or if bleeding occurs  14)Ventricular Tachycardia/H/o Torsades -status post AICD placement - she has CRT-D device followed by EP - she underwent ICD upgrade of single chamber ICD to [employer] CRT-D  Disposition Plan: Patient From: Home D/C Place: Hospice at Home Vs [facility]idential [facility] Barriers: Not Clinically Stable--poor po intake, -Borderline blood glucose despite IV dextrose infusion --Remains very challenging to feed patient as she closes her mouth , clenching her jaw and spits out anything --Family did not want to pursue comfort care and hospice at this time --Seen by Gen Surgery and not felt to be a candidate for PEG tube -Will also need continued conversations with palliative care around prognosis  Code Status : DNR  Procedures-- bedside decubitus ulcer debridement on 10/17/2019 -Right arm PICC line placement on 10/21/2019 -transfusion of 2 units of PRBC on 10/21/2019 -Bedside decubitus debridement by Dr. Constance Haw on  10/22/2019  Family Communication:    Discussed with husband on 11/14/19  Consults  : Palliative care and general surgery  DVT Prophylaxis  :  Teds and SCDs/rectal bleeding/low platelets  Lab Results  Component Value Date   PLT 34 (L) 11/13/2019    Inpatient Medications  Scheduled Meds: . Chlorhexidine Gluconate Cloth  6 each Topical Daily  . collagenase   Topical BID  . feeding supplement (ENSURE ENLIVE)  237 mL Oral TID  . hydrocortisone  25 mg Rectal BID  . levothyroxine  37.5 mcg Intravenous Q0600  . mouth rinse  15 mL Mouth Rinse BID  . pantoprazole (PROTONIX) IV  40 mg Intravenous Q24H  . polyethylene glycol  17 g Oral Daily  . sodium chloride flush  10-40 mL Intracatheter Q12H   Continuous Infusions: . sodium chloride 10 mL/hr at 10/21/19 2018  . dextrose 50 mL/hr at 11/14/19 0513   PRN Meds:.sodium chloride, acetaminophen **OR** acetaminophen, bisacodyl, fentaNYL (SUBLIMAZE) injection, LORazepam, ondansetron (ZOFRAN) IV, sodium chloride flush   Anti-infectives (From admission, onward)   Start     Dose/Rate Route Frequency Ordered Stop   10/25/19 1500  anidulafungin (ERAXIS) 100 mg in sodium chloride 0.9 % 100 mL IVPB     100 mg 78 mL/hr over 100 Minutes Intravenous Every 24 hours 10/24/19 1728 10/28/19 1803   10/24/19 1400  fluconazole (DIFLUCAN) IVPB 200 mg  Status:  Discontinued     200 mg 100 mL/hr over 60 Minutes Intravenous Every 24 hours 10/23/19 1144 10/24/19 1717   10/23/19 1400  fluconazole (DIFLUCAN) IVPB 400 mg     400 mg 100 mL/hr over 120 Minutes Intravenous  Once 10/23/19 1144 10/23/19 2302   10/22/19 2200  cefTRIAXone (ROCEPHIN) 2 g in sodium chloride 0.9 % 100 mL IVPB     2 g 200 mL/hr over 30 Minutes Intravenous Every 24 hours 10/22/19 1022 10/28/19 2259   10/17/19 2359  vancomycin (VANCOREADY) IVPB 1250 mg/250 mL     1,250 mg 166.7 mL/hr over 90 Minutes Intravenous Every 48 hours 11/03/2019 2022 10/22/19 2222   10/23/2019 2359  metroNIDAZOLE (FLAGYL) IVPB 500 mg  Status:  Discontinued     500 mg 100 mL/hr over 60 Minutes Intravenous Every 8 hours 10/12/2019 2047 10/26/19 1604   10/17/2019 2359  ceFEPIme (MAXIPIME) 2 g in sodium chloride 0.9 % 100 mL IVPB  Status:  Discontinued     2 g 200 mL/hr  over 30 Minutes Intravenous Every 24 hours 10/08/2019 2012 10/22/19 1022   10/12/2019 1430  vancomycin (VANCOCIN) IVPB 1000 mg/200 mL premix     1,000 mg 200 mL/hr over 60 Minutes Intravenous  Once 10/31/2019 1428 10/27/2019 1713   10/31/2019 1430  piperacillin-tazobactam (ZOSYN) IVPB 3.375 g     3.375 g 100 mL/hr over 30 Minutes Intravenous  Once 10/08/2019 1428 10/13/2019 1607       Objective:   Vitals:   11/13/19 1100 11/13/19 1408 11/13/19 1959 11/14/19 0508  BP:  (!) 68/44  (!) 103/39  Pulse:  64  62  Resp:  16  18  Temp:    97.7 F (36.5 C)  TempSrc:    Axillary  SpO2: 96% 100% 99% 100%  Weight:      Height:        Wt Readings from Last 3 Encounters:  10/22/19 80.2 kg  06/13/19 78.5 kg  10/19/18 73.6 kg    Intake/Output Summary (Last 24 hours) at 11/14/2019 1033 Last data filed at 11/14/2019 0508 Gross per  24 hour  Intake 0 ml  Output 700 ml  Net -700 ml   Physical Exam  General exam: Somnolent, opens eyes to verbal and tactile stimuli Respiratory system: Diminished in bases, no wheezing cardiovascular system:RRR. No murmurs,   Gastrointestinal system: Abdomen is nondistended, soft and nontender.  +BS Central nervous system: Bedbound, no purposeful movements   Extremities: Pitting edema and anasarca Skin: Bilateral hip ulcers- --Multiple deep skin decubitus please see photos in epic Psychiatry:   Very advanced dementia with significant cognitive and memory deficits--- patient is total care GU--Foley with scant amount of urine MSK-right arm PICC line      Data Review:   Micro Results No results found for this or any previous visit (from the past 240 hour(s)).  Radiology Reports CT ABDOMEN PELVIS WO CONTRAST  Result Date: 10/31/2019 CLINICAL DATA:  Decubitus ulcers EXAM: CT ABDOMEN AND PELVIS WITHOUT CONTRAST TECHNIQUE: Multidetector CT imaging of the abdomen and pelvis was performed following the standard protocol without IV contrast. COMPARISON:  None. FINDINGS: Lower  chest: No acute abnormality. Hepatobiliary: Scattered calcifications are noted within the liver. Gallbladder is well distended with multiple dependent gallstones. Pancreas: Unremarkable. No pancreatic ductal dilatation or surrounding inflammatory changes. Spleen: Normal in size without focal abnormality. Adrenals/Urinary Tract: Adrenal glands are within normal limits. Kidneys are well visualized bilaterally with renal vascular calcifications. No renal stones are seen. No obstructive changes are noted. The bladder is partially distended. Stomach/Bowel: The appendix is not well visualized although no inflammatory changes to suggest appendicitis are noted. Retained fecal material is noted throughout the colon consistent with a mild degree of constipation without obstructive change. No small bowel abnormality is seen. Stomach is unremarkable. Vascular/Lymphatic: Aortic atherosclerosis. No enlarged abdominal or pelvic lymph nodes. Reproductive: Uterus and bilateral adnexa are unremarkable. Other: No abdominal wall hernia or abnormality. No abdominopelvic ascites. Musculoskeletal: Mild changes of anasarca are noted. Decubitus ulcer is noted laterally near the right hip with inflammatory changes extending from the skin towards the greater trochanter. This area measures approximately 7.6 x 4.3 cm. No definitive bony erosive changes to suggest osteomyelitis are noted at this time. No other definitive decubitus ulcer is seen. Degenerative changes of lumbar spine are seen. IMPRESSION: Right lateral decubitus ulcer with subcutaneous inflammatory changes extending towards the greater trochanter of the right proximal femur. No definitive bony destructive changes are seen. Considerable subcutaneous inflammatory changes noted with air tracking towards the lateral aspect of the femur. Changes of mild constipation. Cholelithiasis without complicating factors. Electronically Signed   By: Inez Catalina M.D.   On: 10/23/2019 16:51   US  RENAL  Result Date: 10/18/2019 CLINICAL DATA:  Acute renal insufficiency. EXAM: RENAL / URINARY TRACT ULTRASOUND COMPLETE COMPARISON:  10/11/2019 CT FINDINGS: Right Kidney: Renal measurements: 9.3 x 4.4 x 5.8 cm = volume: 123 mL. Increased renal echogenicity. No hydronephrosis. Left Kidney: Renal measurements: 9.6 x 5.2 x 4.6 cm = volume: 119 mL. Increased renal echogenicity. No hydronephrosis. Bladder: Decompressed. Other: Trace perinephric edema/fluid likely relates to renal insufficiency. Note is made of gallbladder sludge and stones including at 1.0 cm. IMPRESSION: 1. No hydronephrosis. Increased renal echogenicity, consistent with medical renal disease. 2. Cholelithiasis without acute cholecystitis. Electronically Signed   By: Abigail Miyamoto M.D.   On: 10/18/2019 11:51   DG Chest Port 1 View  Result Date: 10/10/2019 CLINICAL DATA:  Sepsis EXAM: PORTABLE CHEST 1 VIEW COMPARISON:  11/05/2014 FINDINGS: Stable positioning of a left-sided implanted cardiac device. Post CABG changes. Stable cardiomediastinal contours. No  focal airspace consolidation, pleural effusion, or pneumothorax. IMPRESSION: No acute cardiopulmonary findings. Electronically Signed   By: Davina Poke D.O.   On: 10/17/2019 14:47   ECHOCARDIOGRAM COMPLETE  Result Date: 11/11/2019    ECHOCARDIOGRAM REPORT   Patient Name:   Foye Spurling Date of Exam: 11/11/2019 Medical Rec #:  [id number]    Height:       64.0 in Accession #:    [id number]   Weight:       176.8 lb Date of Birth:  1934-10-07    BSA:          1.857 m Patient Age:    34 years     BP:           98/72 mmHg Patient Gender: F            HR:           76 bpm. Exam Location:  [facility] Procedure: 2D Echo Indications:    Congestive Heart Failure 428.0 / I50.9  History:        Patient has prior history of Echocardiogram examinations, most                 recent 02/13/2015. CAD; Risk Factors:Hypertension, Diabetes and                 Dyslipidemia. Cardiomyopathy, ischemic EF 20 to 25 %/S/P  ICD,                 Mobitz type 2 second degree atrioventricular block, Complete                 heart block.  Sonographer:    Leavy Cella RDCS (AE) Referring Phys: Calcium  1. Severe global reduction in LV systolic function; moderate LVH; grade 2 diastolic dysfunction; moderate to severe MR; moderate LAE; moderate TR; mild pulmonary hypertension.  2. Left ventricular ejection fraction, by estimation, is 25 to 30%. The left ventricle has severely decreased function. The left ventricle demonstrates global hypokinesis. There is moderate left ventricular hypertrophy. Left ventricular diastolic parameters are consistent with Grade II diastolic dysfunction (pseudonormalization). Elevated left atrial pressure.  3. Right ventricular systolic function is normal. The right ventricular size is normal. There is mildly elevated pulmonary artery systolic pressure.  4. Left atrial size was moderately dilated.  5. The mitral valve is normal in structure. Moderate to severe mitral valve regurgitation. No evidence of mitral stenosis.  6. Tricuspid valve regurgitation is moderate.  7. The aortic valve is tricuspid. Aortic valve regurgitation is mild. Mild aortic valve sclerosis is present, with no evidence of aortic valve stenosis.  8. The inferior vena cava is normal in size with greater than 50% respiratory variability, suggesting right atrial pressure of 3 mmHg. FINDINGS  Left Ventricle: Left ventricular ejection fraction, by estimation, is 25 to 30%. The left ventricle has severely decreased function. The left ventricle demonstrates global hypokinesis. The left ventricular internal cavity size was normal in size. There is moderate left ventricular hypertrophy. Left ventricular diastolic parameters are consistent with Grade II diastolic dysfunction (pseudonormalization). Elevated left atrial pressure. Right Ventricle: The right ventricular size is normal. Right ventricular systolic function is normal.  There is mildly elevated pulmonary artery systolic pressure. The tricuspid regurgitant velocity is 2.93 m/s, and with an assumed right atrial pressure of 3 mmHg, the estimated right ventricular systolic pressure is 16.3 mmHg. Left Atrium: Left atrial size was moderately dilated. Right Atrium: Right atrial size was normal in size.  Pericardium: There is no evidence of pericardial effusion. Mitral Valve: The mitral valve is normal in structure. Normal mobility of the mitral valve leaflets. Moderate to severe mitral valve regurgitation. No evidence of mitral valve stenosis. Tricuspid Valve: The tricuspid valve is normal in structure. Tricuspid valve regurgitation is moderate . No evidence of tricuspid stenosis. Aortic Valve: The aortic valve is tricuspid. Aortic valve regurgitation is mild. Aortic regurgitation PHT measures 379 msec. Mild aortic valve sclerosis is present, with no evidence of aortic valve stenosis. Pulmonic Valve: The pulmonic valve was normal in structure. Pulmonic valve regurgitation is trivial. No evidence of pulmonic stenosis. Aorta: The aortic root is normal in size and structure. Venous: The inferior vena cava is normal in size with greater than 50% respiratory variability, suggesting right atrial pressure of 3 mmHg.  Additional Comments: Severe global reduction in LV systolic function; moderate LVH; grade 2 diastolic dysfunction; moderate to severe MR; moderate LAE; moderate TR; mild pulmonary hypertension. A pacer wire is visualized.  LEFT VENTRICLE PLAX 2D LVIDd:         4.42 cm  Diastology LVIDs:         3.76 cm  LV e' lateral:   2.80 cm/s LV PW:         1.39 cm  LV E/e' lateral: 57.1 LV IVS:        1.17 cm  LV e' medial:    3.33 cm/s LVOT diam:     1.90 cm  LV E/e' medial:  48.0 LVOT Area:     2.84 cm  RIGHT VENTRICLE RV S prime:     8.59 cm/s TAPSE (M-mode): 1.7 cm LEFT ATRIUM             Index LA diam:        4.60 cm 2.48 cm/m LA Vol (A2C):   84.2 ml 45.35 ml/m LA Vol (A4C):   78.6 ml  42.34 ml/m LA Biplane Vol: 82.1 ml 44.22 ml/m  AORTIC VALVE AI PHT:      379 msec  AORTA Ao Root diam: 2.60 cm MITRAL VALVE                 TRICUSPID VALVE MV Area (PHT): 4.39 cm      TR Peak grad:   34.3 mmHg MV Decel Time: 173 msec      TR Vmax:        293.00 cm/s MR Peak grad:    105.3 mmHg MR Mean grad:    65.0 mmHg   SHUNTS MR Vmax:         513.00 cm/s Systemic Diam: 1.90 cm MR Vmean:        375.0 cm/s MR PISA:         4.02 cm MR PISA Eff ROA: 24 mm MR PISA Radius:  0.80 cm MV E velocity: 160.00 cm/s MV A velocity: 138.00 cm/s MV E/A ratio:  1.16 Kirk Ruths MD Electronically signed by Kirk Ruths MD Signature Date/Time: 11/11/2019/12:10:35 PM    Final    [place] EKG SITE RITE  Result Date: 10/20/2019 If Site Rite image not attached, placement could not be confirmed due to current cardiac rhythm.   CBC Recent Labs  Lab 11/08/19 0807 11/10/19 0800 11/11/19 0528 11/12/19 0410 11/13/19 0804  WBC 4.3 2.9* 3.2* 3.2* 3.4*  HGB 7.4* 6.7* 7.7* 7.6* 8.5*  HCT 24.2* 21.5* 24.7* 24.9* 27.3*  PLT 37* 38* 34* 35* 34*  MCV 96.4 94.3 91.8 91.9 91.0  MCH 29.5 29.4 28.6 28.0  28.3  MCHC 30.6 31.2 31.2 30.5 31.1  RDW 17.6* 17.3* 17.7* 17.6* 17.3*    Chemistries  Recent Labs  Lab 11/09/19 0439 11/10/19 0800 11/11/19 0528 11/12/19 0410 11/13/19 0804  NA 137 135 128* 133* 130*  K 3.7 3.7 3.6 3.6 3.7  CL 115* 113* 106 112* 110  CO2 17* 17* 16* 17* 16*  GLUCOSE 92 95 541* 114* 111*  BUN '23 23 22 22 23  ' CREATININE 1.42* 1.56* 1.49* 1.59* 1.67*  CALCIUM 7.5* 7.6* 7.3* 7.6* 7.4*  AST 10*  --   --   --   --   ALT 7  --   --   --   --   ALKPHOS 78  --   --   --   --   BILITOT 0.4  --   --   --   --    ------------------------------------------------------------------------------------------------------------------ No results for input(s): CHOL, HDL, LDLCALC, TRIG, CHOLHDL, LDLDIRECT in the last 72 hours.  Lab Results  Component Value Date   HGBA1C 5.5 10/19/2019    ------------------------------------------------------------------------------------------------------------------ No results for input(s): TSH, T4TOTAL, T3FREE, THYROIDAB in the last 72 hours.  Invalid input(s): FREET3 ------------------------------------------------------------------------------------------------------------------ No results for input(s): VITAMINB12, FOLATE, FERRITIN, TIBC, IRON, RETICCTPCT in the last 72 hours.  Coagulation profile No results for input(s): INR, PROTIME in the last 168 hours.  No results for input(s): DDIMER in the last 72 hours.  Cardiac Enzymes No results for input(s): CKMB, TROPONINI, MYOGLOBIN in the last 168 hours.  Invalid input(s): CK     Component Value Date/Time   BNP 583.7 (H) 11/01/2014 0733   - 11/14/2019---- Remains unresponsive -Mariana Kaufman (palliative care) I had a long and detailed  conference with patient's spouse -Explained that despite 1 month of aggressive management patient's prognosis remains poor -Our testing and investigations appear to be adding to patient's discomfort without really benefiting patient in a meaningful way -At this time our actions appears to be futile -Patient is still is without oral intake due to very advanced dementia with severe cognitive deficits -Patient has been on dextrose solution for awhile now to prevent recurrent hypoglycemia -Evaluated by general surgery and deemed not to be a candidate for PEG tube placement, -Thrombocytopenia persist -Overall prognosis remains poor -Kasie and  I informed patient's husband that our aggressive  treatment and interventions have not substantially changed patient's overall prognosis -Given the futility of our actions we will proceed with an ethics consult to determine what is in the best interest of Kelly Tanaiya [place]  --Husband appears to be unhappy with the conversation about the futility of our treatment and interventions  Roxan Hockey M.D on 11/14/2019 at  10:33 AM  Go to [email or web address] - for contact info  [facility] - Office  [phone]

## 2019-11-14 NOTE — Progress Notes (Signed)
Continued support for [name]'s care, especially with her husband [name]. Throughout her hospitalization he has been present with her. He has shared his emotional and spiritual processing while she has been receiving care.  In the midst of discussion about her plan of care ahead, will continue offering support. Will also help facilitate Ethics Committee discussion on 11/16/19 in the am.

## 2019-11-14 NOTE — Progress Notes (Signed)
Dressings changed.  Patient tolerated well. 

## 2019-11-15 NOTE — Progress Notes (Signed)
Daily Progress Note   Patient Name: [name]       Date: 11/15/2019 DOB: 25-Feb-1935  Age: 85 y.o. MRN#: [id number] Attending Physician: Roxan Hockey, MD Primary Care Physician: [name], MD Admit Date: 10/27/2019  Reason for Consultation/Follow-up: Establishing goals of care and Family-Clinician negotiation  Subjective: Yaniyah continues to be bedbound, with copious oral secretions, she does not respond to my voice or touch.  Met at bedside with Dr. Denton Brick, patient's spouse, and patient's nurse Caryl Pina. Patient's son, [name], was later contacted by phone. Continued discussion regarding goals of care. Mr. Witts feels that stopping IV fluids would be "playing God" and expresses that he does not agree with this plan. We discussed that IV fluids are a medical intervention that is typically a temporary measure with the goal of reversing a treatable process. Unfortunately, Clyda is dying from her end stage dementia. She can no longer eat or drink, has heart failure, and is not a candidate for PEG tube. These are irreversible processes that the IV fluids cannot reverse- all providers feel that continuing IV fluids is futile as they are not improving her cognitive or physical status, are not increasing quality of life, and are not providing symptom management.  The futility policy was discussed.   Review of Systems  Unable to perform ROS: Dementia    Length of Stay: 30  Current Medications: Scheduled Meds:  . Chlorhexidine Gluconate Cloth  6 each Topical Daily  . collagenase   Topical BID  . feeding supplement (ENSURE ENLIVE)  237 mL Oral TID  . hydrocortisone  25 mg Rectal BID  . levothyroxine  37.5 mcg Intravenous Q0600  . mouth rinse  15 mL Mouth Rinse BID  . pantoprazole (PROTONIX)  IV  40 mg Intravenous Q24H  . polyethylene glycol  17 g Oral Daily  . sodium chloride flush  10-40 mL Intracatheter Q12H    Continuous Infusions: . sodium chloride 10 mL/hr at 10/21/19 2018  . dextrose 30 mL/hr at 11/15/19 0549    PRN Meds: sodium chloride, acetaminophen **OR** acetaminophen, bisacodyl, fentaNYL (SUBLIMAZE) injection, LORazepam, ondansetron (ZOFRAN) IV, sodium chloride flush  Physical Exam Vitals and nursing note reviewed.  Cardiovascular:     Comments: Diffuse anasarca            Vital Signs: BP (!) 92/44  Pulse (!) 58   Temp 97.9 F (36.6 C) (Oral)   Resp 16   Ht '5\' 4"'  (1.626 m)   Wt 80.2 kg   SpO2 100%   BMI 30.35 kg/m  SpO2: SpO2: 100 % O2 Device: O2 Device: Room Air O2 Flow Rate:    Intake/output summary:   Intake/Output Summary (Last 24 hours) at 11/15/2019 1354 Last data filed at 11/15/2019 4098 Gross per 24 hour  Intake 2100.12 ml  Output 150 ml  Net 1950.12 ml   LBM: Last BM Date: 11/09/19 Baseline Weight: Weight: 72 kg Most recent weight: Weight: 80.2 kg       Palliative Assessment/Data: PPS: 10%    Flowsheet Rows     Most Recent Value  Intake Tab  Referral Department  Hospitalist  Unit at Time of Referral  Cardiac/Telemetry Unit  Palliative Care Primary Diagnosis  Other (Comment)  Date Notified  10/17/19  Palliative Care Type  New Palliative care  Reason for referral  Clarify Goals of Care  Date of Admission  11/04/2019  Date first seen by Palliative Care  10/18/19  # of days Palliative referral response time  1 Day(s)  # of days IP prior to Palliative referral  1  Clinical Assessment  Palliative Performance Scale Score  30%  Pain Max last 24 hours  Not able to report  Pain Min Last 24 hours  Not able to report  Dyspnea Max Last 24 Hours  Not able to report  Dyspnea Min Last 24 hours  Not able to report  Psychosocial & Spiritual Assessment  Palliative Care Outcomes      Patient Active Problem List   Diagnosis Date  Noted  . Dementia without behavioral disturbance ([place])   . Thrombocytopenia Acquired 10/25/2019  . Severe sepsis with septic shock - Klebsiella/Proteus/E. coli/Bacteroides and Staph  10/21/2019  . Sepsis secondary to Klebsiella UTI ([place]) 10/21/2019  . Anemia due to acute blood loss/rectal bleeding 10/21/2019  . Hematochezia 10/20/2019  . Bacteremia due to Gram-negative bacteria 10/18/2019  . Gram negative sepsis ([place]) 10/18/2019  . AKI (acute kidney injury) ([place])   . Goals of care, counseling/discussion   . Palliative care by specialist   . Encounter for hospice care discussion   . Sepsis due to undetermined organism ([place]) 10/17/2019  . Failure to thrive in adult 10/17/2019  . Decubitus ulcer of left hip, stage 3/Infected with E-coli and Proteus 10/17/2019  . Stage I pressure ulcer of sacral region   . Sepsis ([place]) 10/08/2019  . Decubitus ulcer of right hip, stage 4 /Infected with E-coli and Proteus 10/23/2019  . Polymorphic ventricular tachycardia ([place]) 08/15/2015  . S/P ICD (internal cardiac defibrillator) procedure, with upgrade to ICD/CRT-D  Medtronic 11/06/2014  . Cardiomyopathy, ischemic EF 20 to 25 %/history of combined systolic and diastolic dysfunction CHF 11/91/4782  . Weakness 11/06/2014  . AICD (automatic cardioverter/defibrillator) present   . Torsades de pointes with 3 shocks, another pace terminated 10/31/2014  . Mobitz type 2 second degree atrioventricular block 10/31/2014  . Complete heart block ([place]) 10/31/2014  . CAD (coronary artery disease) 04/13/2012  . Acute systolic CHF (congestive heart failure) ([place]) 04/12/2012  . Hypertension   . High cholesterol   . Type II diabetes mellitus [facility])     Palliative Care Assessment & Plan   Patient Profile: 85 yo female with PMH significant for Alzheimer's dementia, bedridden, CAD, systolic CHF (EF 95-62%), torsades s/p Medtronic AICD, hypertension, diabetes admitted 10/14/2019 with decubitus ulcers and poor oral intake  with  sepsis UTI/wound infection/potential bacteremia, acute kidney injury, anemia and overall failure to thrive with poor intake. Per notes family have decided against feeding tube and do not desire hospice.Hypothermiahas resolvedand thrombocytopeniais slowly improving.Overall adult failure to thrive with underlying illness and poor recovery from acute illness.Palliative medicine consulted for assistance with goals of care.  Assessment/Recommendations/Plan   I continue to support Dr. Talmadge Coventry position that IV fluids are no longer appropriate for this patient- noted she is now positive almost two liters of fluid- continuing to pump fluids into her when she is third spacing noted by diffuse anasarca is causing her burden and harm  Negotiation with family has been attempted by multiple providers including myself and other Palliative team members  Plan for family meeting with ethics committee Kelly morning at 10am   Code Status:  DNR  Prognosis:   Hours - Days  Discharge Planning:  To Be Determined  Care plan was discussed with Dr. Denton Brick, patient's spouse, and son.   Thank you for allowing the Palliative Medicine Team to assist in the care of this patient.   Time In: 1000 Time Out: 1035 Total Time 35 mins Prolonged Time Billed no      Greater than 50%  of this time was spent counseling and coordinating care related to the above assessment and plan.  Mariana Kaufman, AGNP-C Palliative Medicine   Please contact Palliative Medicine Team phone at [phone] for questions and concerns.

## 2019-11-15 NOTE — Progress Notes (Addendum)
Patient Demographics:    [name], is a 85 y.o. female, DOB - [birth year], GXI:[id number]  Admit date - 11/07/2019   Admitting Physician Kelly Arlyce Dice, MD  Outpatient Primary MD for the patient is Kelly Blitz, MD  LOS - 64  Chief Complaint  Patient presents with  . Skin Ulcer       Subjective:   Remains unresponsive -[name] (palliative care), RN [name] and  I had a long and detailed full conference with pts spouse at bedside --Patient's son [name] was also called and placed on speaker phone during part of our conference with patient's husband at bedside -Please see phone note from palliative care provider [name] -Futility policy was discussed in detail with regards to continue IV dextrose solution.  -Patient and patient's son advised that if they find a facility or medical professional who is willing to accept patient in transfer and willing to give IV dextrose solution continuously --we will be happy to transfer patient to that facility  -Total care time more than 35 minutes with more than 50% spent in care coordination and counseling    Assessment  & Plan :    Principal Problem:   Severe sepsis with septic shock - Klebsiella/Proteus/E. coli/Bacteroides and Staph  Active Problems:   S/P ICD (internal cardiac defibrillator) procedure, with upgrade to ICD/CRT-D  [employer]   Cardiomyopathy, ischemic EF 20 to 25 %/history of combined systolic and diastolic dysfunction CHF   Decubitus ulcer of right hip, stage 4 /Infected with E-coli and Proteus   Failure to thrive in adult   Decubitus ulcer of left hip, stage 3/Infected with E-coli and Proteus   Goals of care, counseling/discussion   Hematochezia   Sepsis secondary to Klebsiella UTI ([place])   Anemia due to acute blood loss/rectal bleeding   Type II diabetes mellitus ([place])   AKI (acute kidney injury) ([place])   Thrombocytopenia  Acquired   Hypertension   Mobitz type 2 second degree atrioventricular block   Polymorphic ventricular tachycardia ([place])   Sepsis ([place])   Stage I pressure ulcer of sacral region   Bacteremia due to Gram-negative bacteria   Gram negative sepsis ([place])   Palliative care by specialist   Encounter for hospice care discussion   Dementia without behavioral disturbance [facility])   Brief Summary 85 year old female with a history of advanced Alzheimer's dementia, CAD, H/o combined systolic and diastolic dysfunction CHF/HFrEF/ischemic cardiomyopathy with EF 20 to 25%, h/o torsades VT status post AICD, H/o HTN, DM2, HLD, admitted on 11/05/2019 from home with metabolic encephalopathy in the setting of presumed infection (leukocytosis and Lactic acid was 3.1) with multiple decubitus ulcers -Weaned off IV Levophed on 10/22/19 -Requiring warming blanket from time to time ---Patient still unable to have oral intake due to advanced cognitive deficits--- she will spit food out, clenching her jaw and resist any attempt to feed her  --Pt has been made NPO for now as she is not awake or alert enough to safely feed by mouth. Husband has requested permission to be able to feed her by mouth when he is here visiting----Husband accepts aspiration risk -overall prognosis remains poor due to inability to take oral intake --On IV dextrose solution due to hypoglycemia risk as a result of lack of oral  intake - 11/14/2019---- Remains unresponsive -[name] (palliative care) I had a long and detailed  conference with patient's spouse -Explained that despite 1 month of aggressive management patient's prognosis remains poor -Our testing and investigations appear to be adding to patient's discomfort without really benefiting patient in a meaningful way -At this time our actions appears to be futile -Patient is still is without oral intake due to very advanced dementia with severe cognitive deficits -Patient has been on dextrose  solution for awhile now to prevent recurrent hypoglycemia -Evaluated by general surgery and deemed not to be a candidate for PEG tube placement, -Thrombocytopenia persist -Overall prognosis remains poor -Kasie and  I informed patient's husband that our aggressive  treatment and interventions have not substantially changed patient's overall prognosis -Given the futility of our actions we will proceed with an ethics consult to determine what is in the best interest of [name]  --Husband appears to be unhappy with the conversation about the futility of our treatment and interventions   Assessment/Plan: 1)Severe Sepsis with Septic shock-- POA -Etiology is multifactorial:-Patient has Klebsiella UTI (urine cx 10/17/19), E. coli and Proteus wound decubitus ulcer/wound infection (wound cx from 11/04/2019 and 10/17/19) and staph epi and Bacteroides bacteremia from blood culture dated 10/26/2019 -Weaned off IV Levophed on 10/22/19 --Stopped vancomycin after 10/21/2019 -MRSA PCR negative on 10/20/2019 -Repeat blood culture from 10/21/2019 NGTD -Patient remains encephalopathic, at baseline she does have advanced dementia  with significant cognitive and memory deficits -WBC is down to 3.4  from a peak of 25.6 -PCT was  2.91, ESR 60, CRP 14.1 - overall prognosis remains poor especially given lack of oral intake -Antimicrobials/Anti-fungal this admission: - Diflucan 3/16 and 3/17  -Ceftriaxone 10/22/2019 through 10/28/2019 inclusive  --metronidazole -10/14/2019 through 10/26/2019 inclusive  --zosyn x 1 on 11/03/2019 -- cefepime 10/10/2019 through 10/22/19 -Vanc --10/22/2019 through 10/22/19 -Eraxis -10/25/2019 through 10/28/2019 inclusive  2)Bacteremia--B.fragilis and staph epi--- ???  Significance of blood culture from 10/25/2019 -??  Contaminant, however patient was septic --Sepsis pathophysiology resolved and Antibiotics as above #1 -Repeat blood cultures on 10/21/2019 NGTD - 3)Infected Bilateral Hip and Sacral  decubitus ulcers--please see photos in epic -10/13/2019 CT abdomen/pelvis--right lateral decubitus ulcer with subcutaneous inflammatory changes extending toward the greater trochanter. There is no bony destructive changes. There is considerable inflammatory change with air tracking toward the lateral aspect of the femur. -Since wounds are bilateral, is difficult to position patient so the pressure is offloaded from wounds -General surgery consultappreciated -10/17/19--bedside debridement -Wound cultures from 10/28/2019 and 10/17/2019 with E. coli and Proteus --Wound culture with fungus reluctant to use Diflucan due to prolonged QT concerns--treated  with anidulafungin -Sepsis pathophysiology resolved and completed antibiotics as above #1  4) Klebsiella UTI--urine culture from 10/17/2019 noted --Sepsis pathophysiology and completed antibiotics as above #1  5)Failure to thrive -Serum B12--199-->supplement -TSH--37.364, Free T4--0.28 -Folate--7.2 -PT evaluation-->SNF -continues to have poor po intake -Cognitive status and encephalopathy makes it difficult to feed orally ---Patient still unable to have oral intake due to advanced cognitive deficits--- she will spit food out, clenching her jaw and resist any attempt to feed her -Continue judicious IV dextrose fluids given that patient's EF is down to 20 to 25% -Low blood glucose despite IV dextrose infusion --> she has been transitioned to D10 infusion -She is developing significant anasarca due to third spacing IV fluids --Remains very challenging to feed patient as she closes her mouth , clenching her jaw and spits out anything ---Pt has been made  NPO for now as she is not awake or alert enough to safely feed by mouth. Husband has requested permission to be able to feed her by mouth when he is here visiting--Husband accepts aspiration risk -Seen by Dr. Constance Haw for PEG tube placement.  It was felt that she would not be an appropriate candidate for  PEG tube placement -Currently, patient remains hypotensive and overall prognosis is poor.   6)Symptomatic acute on chronic blood loss anemia due to Rectal bleeding- transfusion of 2 units of PRBC on 10/21/2019 and 1 unit of PRBC on 4/3 -No further rectal bleeding -Protonix for GI prophylaxis -Hemoglobin currently above 8 GI consult appreciated suspect rectal injury from fecal impaction--suppositories as ordered   7)CKD stage 3b -initially felt to be AKI but now more likely progression of CKD -pt likely has progression of underlying CKD -Creatinine hovering below 2 -No recent renal labs available (prior to admission ) to compare current values to -renal US-->medical renal disease -Creatinine is down to 1.67 from a peak of 1.83 - renally adjust medications, avoid nephrotoxic agents / dehydration  / hypotension -Very scant urine output in Foley bag -Foley was placed due to urinary retention  8)H/o combined systolic and diastolic dysfunction CHF/HFrEF/ischemic cardiomyopathy with EF 25-30% -BP improved with iVF    9)Hypernatremia/dehydration -Sodium trended down with dextrose solution --On IV dextrose solution (D10) due to hypoglycemia risk as a result of lack of oral intake  10)Alzheimer's dementia/ambulatory dysfunction -Continue Namenda -spouse states pt screams frequently at home -At baseline patient has very advanced dementia with severe cognitive and memory deficits, she has been bed-bound for the last 3 months PTA, requiring total care  11)Coronary artery disease -hx of CABG in 2009 (LIMA-LAD, SVG-ramus, SVG-OM). Hx of RCA stent 04/2012 -no chest pain presently Discontinued Aspirin and Plavix due to rectal bleeding with symptomatic Anemia -Continue statin -Coreg on hold due to hypotension  12)Social/Ethics--- overall prognosis is poor, patient is a DNR/DNI -Plan of care, goals of care discussed with patient's husband at bedside -Official palliative care consult  appreciated -Family declined comfort care and hospice at this time -Very advanced dementia with significant cognitive and memory deficits--- patient is total care   13)Thrombocytopenia---  Platelets 206 >> 188>>> 117>> 74>> 48>>> 38>>32>>27>>24>>18>>19>>28>>38>>35K >>34  -- Possibly related to underlying infectious process.  No signs of bleeding at this time.   --Consider transfusion (if family desires) if platelet count below 10K or if bleeding occurs  14)Ventricular Tachycardia/H/o Torsades -status post AICD placement - she has CRT-D device followed by EP - she underwent ICD upgrade of single chamber ICD to [employer] CRT-D  Disposition Plan: Patient From: Home D/C Place: Hospice at Home [facility]s [facility] Barriers: Not Clinically Stable--poor po intake, -Borderline blood glucose despite IV dextrose infusion --Remains very challenging to feed patient as she closes her mouth , clenching her jaw and spits out anything --Family did not want to pursue comfort care and hospice at this time --Seen by Gen Surgery and not felt to be a candidate for PEG tube -Will also need continued conversations with palliative care around prognosis  Code Status : DNR  Procedures-- bedside decubitus ulcer debridement on 10/17/2019 -Right arm PICC line placement on 10/21/2019 -transfusion of 2 units of PRBC on 10/21/2019 -Bedside decubitus debridement by Dr. Constance Haw on 10/22/2019  Family Communication:    Discussed with husband on 11/14/19  Consults  : Palliative care and general surgery  DVT Prophylaxis  :  Teds and SCDs/rectal bleeding/low platelets  Lab Results  Component Value Date   PLT 34 (L) 11/13/2019   Inpatient Medications  Scheduled Meds: . Chlorhexidine Gluconate Cloth  6 each Topical Daily  . collagenase   Topical BID  . feeding supplement (ENSURE ENLIVE)  237 mL Oral TID  . hydrocortisone  25 mg Rectal BID  . levothyroxine  37.5 mcg Intravenous Q0600  . mouth rinse  15 mL  Mouth Rinse BID  . pantoprazole (PROTONIX) IV  40 mg Intravenous Q24H  . polyethylene glycol  17 g Oral Daily  . sodium chloride flush  10-40 mL Intracatheter Q12H   Continuous Infusions: . sodium chloride 10 mL/hr at 10/21/19 2018  . dextrose 10 mL/hr at 11/15/19 1449   PRN Meds:.sodium chloride, acetaminophen **OR** acetaminophen, bisacodyl, fentaNYL (SUBLIMAZE) injection, LORazepam, ondansetron (ZOFRAN) IV, sodium chloride flush   Anti-infectives (From admission, onward)   Start     Dose/Rate Route Frequency Ordered Stop   10/25/19 1500  anidulafungin (ERAXIS) 100 mg in sodium chloride 0.9 % 100 mL IVPB     100 mg 78 mL/hr over 100 Minutes Intravenous Every 24 hours 10/24/19 1728 10/28/19 1803   10/24/19 1400  fluconazole (DIFLUCAN) IVPB 200 mg  Status:  Discontinued     200 mg 100 mL/hr over 60 Minutes Intravenous Every 24 hours 10/23/19 1144 10/24/19 1717   10/23/19 1400  fluconazole (DIFLUCAN) IVPB 400 mg     400 mg 100 mL/hr over 120 Minutes Intravenous  Once 10/23/19 1144 10/23/19 2302   10/22/19 2200  cefTRIAXone (ROCEPHIN) 2 g in sodium chloride 0.9 % 100 mL IVPB     2 g 200 mL/hr over 30 Minutes Intravenous Every 24 hours 10/22/19 1022 10/28/19 2259   10/17/19 2359  vancomycin (VANCOREADY) IVPB 1250 mg/250 mL     1,250 mg 166.7 mL/hr over 90 Minutes Intravenous Every 48 hours 11/03/2019 2022 10/22/19 2222   10/30/2019 2359  metroNIDAZOLE (FLAGYL) IVPB 500 mg  Status:  Discontinued     500 mg 100 mL/hr over 60 Minutes Intravenous Every 8 hours 10/29/2019 2047 10/26/19 1604   10/21/2019 2359  ceFEPIme (MAXIPIME) 2 g in sodium chloride 0.9 % 100 mL IVPB  Status:  Discontinued     2 g 200 mL/hr over 30 Minutes Intravenous Every 24 hours 11/05/2019 2012 10/22/19 1022   10/18/2019 1430  vancomycin (VANCOCIN) IVPB 1000 mg/200 mL premix     1,000 mg 200 mL/hr over 60 Minutes Intravenous  Once 11/06/2019 1428 10/23/2019 1713   10/15/2019 1430  piperacillin-tazobactam (ZOSYN) IVPB 3.375 g      3.375 g 100 mL/hr over 30 Minutes Intravenous  Once 10/14/2019 1428 10/12/2019 1607       Objective:   Vitals:   11/14/19 2105 11/15/19 0543 11/15/19 1441 11/15/19 1640  BP: (!) 92/53 (!) 92/44 (!) 93/45   Pulse: 69 (!) 58 63   Resp: '16 16 18   ' Temp:   98.1 F (36.7 C)   TempSrc:   Oral   SpO2: 100% 100% 100% 96%  Weight:      Height:        Wt Readings from Last 3 Encounters:  10/22/19 80.2 kg  06/13/19 78.5 kg  10/19/18 73.6 kg    Intake/Output Summary (Last 24 hours) at 11/15/2019 1920 Last data filed at 11/15/2019 0929 Gross per 24 hour  Intake 2100.12 ml  Output 150 ml  Net 1950.12 ml   Physical Exam  General exam: Somnolent, opens eyes to verbal and tactile stimuli Respiratory system:  Diminished in bases, no wheezing cardiovascular system:RRR. No murmurs,   Gastrointestinal system: Abdomen is nondistended, soft and nontender.  +BS Central nervous system: Bedbound, no purposeful movements   Extremities: Pitting edema and anasarca Skin: Bilateral hip ulcers- --Multiple deep skin decubitus please see photos in epic Psychiatry:   Very advanced dementia with significant cognitive and memory deficits--- patient is total care GU--Foley with scant amount of urine MSK-right arm PICC line      Data Review:   Micro Results No results found for this or any previous visit (from the past 240 hour(s)).  Radiology Reports US RENAL  Result Date: 10/18/2019 CLINICAL DATA:  Acute renal insufficiency. EXAM: RENAL / URINARY TRACT ULTRASOUND COMPLETE COMPARISON:  10/27/2019 CT FINDINGS: Right Kidney: Renal measurements: 9.3 x 4.4 x 5.8 cm = volume: 123 mL. Increased renal echogenicity. No hydronephrosis. Left Kidney: Renal measurements: 9.6 x 5.2 x 4.6 cm = volume: 119 mL. Increased renal echogenicity. No hydronephrosis. Bladder: Decompressed. Other: Trace perinephric edema/fluid likely relates to renal insufficiency. Note is made of gallbladder sludge and stones including at 1.0 cm.  IMPRESSION: 1. No hydronephrosis. Increased renal echogenicity, consistent with medical renal disease. 2. Cholelithiasis without acute cholecystitis. Electronically Signed   By: Abigail Miyamoto M.D.   On: 10/18/2019 11:51   ECHOCARDIOGRAM COMPLETE  Result Date: 11/11/2019    ECHOCARDIOGRAM REPORT   Patient Name:   Foye Spurling Date of Exam: 11/11/2019 Medical Rec #:  [id number]    Height:       64.0 in Accession #:    [id number]   Weight:       176.8 lb Date of Birth:  02/19/1935    BSA:          1.857 m Patient Age:    55 years     BP:           98/72 mmHg Patient Gender: F            HR:           76 bpm. Exam Location:  [facility] Procedure: 2D Echo Indications:    Congestive Heart Failure 428.0 / I50.9  History:        Patient has prior history of Echocardiogram examinations, most                 recent 02/13/2015. CAD; Risk Factors:Hypertension, Diabetes and                 Dyslipidemia. Cardiomyopathy, ischemic EF 20 to 25 %/S/P ICD,                 Mobitz type 2 second degree atrioventricular block, Complete                 heart block.  Sonographer:    Leavy Cella RDCS (AE) Referring Phys: [place]  1. Severe global reduction in LV systolic function; moderate LVH; grade 2 diastolic dysfunction; moderate to severe MR; moderate LAE; moderate TR; mild pulmonary hypertension.  2. Left ventricular ejection fraction, by estimation, is 25 to 30%. The left ventricle has severely decreased function. The left ventricle demonstrates global hypokinesis. There is moderate left ventricular hypertrophy. Left ventricular diastolic parameters are consistent with Grade II diastolic dysfunction (pseudonormalization). Elevated left atrial pressure.  3. Right ventricular systolic function is normal. The right ventricular size is normal. There is mildly elevated pulmonary artery systolic pressure.  4. Left atrial size was moderately dilated.  5. The mitral valve is normal in  structure. Moderate to severe  mitral valve regurgitation. No evidence of mitral stenosis.  6. Tricuspid valve regurgitation is moderate.  7. The aortic valve is tricuspid. Aortic valve regurgitation is mild. Mild aortic valve sclerosis is present, with no evidence of aortic valve stenosis.  8. The inferior vena cava is normal in size with greater than 50% respiratory variability, suggesting right atrial pressure of 3 mmHg. FINDINGS  Left Ventricle: Left ventricular ejection fraction, by estimation, is 25 to 30%. The left ventricle has severely decreased function. The left ventricle demonstrates global hypokinesis. The left ventricular internal cavity size was normal in size. There is moderate left ventricular hypertrophy. Left ventricular diastolic parameters are consistent with Grade II diastolic dysfunction (pseudonormalization). Elevated left atrial pressure. Right Ventricle: The right ventricular size is normal. Right ventricular systolic function is normal. There is mildly elevated pulmonary artery systolic pressure. The tricuspid regurgitant velocity is 2.93 m/s, and with an assumed right atrial pressure of 3 mmHg, the estimated right ventricular systolic pressure is 93.2 mmHg. Left Atrium: Left atrial size was moderately dilated. Right Atrium: Right atrial size was normal in size. Pericardium: There is no evidence of pericardial effusion. Mitral Valve: The mitral valve is normal in structure. Normal mobility of the mitral valve leaflets. Moderate to severe mitral valve regurgitation. No evidence of mitral valve stenosis. Tricuspid Valve: The tricuspid valve is normal in structure. Tricuspid valve regurgitation is moderate . No evidence of tricuspid stenosis. Aortic Valve: The aortic valve is tricuspid. Aortic valve regurgitation is mild. Aortic regurgitation PHT measures 379 msec. Mild aortic valve sclerosis is present, with no evidence of aortic valve stenosis. Pulmonic Valve: The pulmonic valve was normal in structure. Pulmonic valve  regurgitation is trivial. No evidence of pulmonic stenosis. Aorta: The aortic root is normal in size and structure. Venous: The inferior vena cava is normal in size with greater than 50% respiratory variability, suggesting right atrial pressure of 3 mmHg.  Additional Comments: Severe global reduction in LV systolic function; moderate LVH; grade 2 diastolic dysfunction; moderate to severe MR; moderate LAE; moderate TR; mild pulmonary hypertension. A pacer wire is visualized.  LEFT VENTRICLE PLAX 2D LVIDd:         4.42 cm  Diastology LVIDs:         3.76 cm  LV e' lateral:   2.80 cm/s LV PW:         1.39 cm  LV E/e' lateral: 57.1 LV IVS:        1.17 cm  LV e' medial:    3.33 cm/s LVOT diam:     1.90 cm  LV E/e' medial:  48.0 LVOT Area:     2.84 cm  RIGHT VENTRICLE RV S prime:     8.59 cm/s TAPSE (M-mode): 1.7 cm LEFT ATRIUM             Index LA diam:        4.60 cm 2.48 cm/m LA Vol (A2C):   84.2 ml 45.35 ml/m LA Vol (A4C):   78.6 ml 42.34 ml/m LA Biplane Vol: 82.1 ml 44.22 ml/m  AORTIC VALVE AI PHT:      379 msec  AORTA Ao Root diam: 2.60 cm MITRAL VALVE                 TRICUSPID VALVE MV Area (PHT): 4.39 cm      TR Peak grad:   34.3 mmHg MV Decel Time: 173 msec      TR Vmax:  293.00 cm/s MR Peak grad:    105.3 mmHg MR Mean grad:    65.0 mmHg   SHUNTS MR Vmax:         513.00 cm/s Systemic Diam: 1.90 cm MR Vmean:        375.0 cm/s MR PISA:         4.02 cm MR PISA Eff ROA: 24 mm MR PISA Radius:  0.80 cm MV E velocity: 160.00 cm/s MV A velocity: 138.00 cm/s MV E/A ratio:  1.16 Kirk Ruths MD Electronically signed by Kirk Ruths MD Signature Date/Time: 11/11/2019/12:10:35 PM    Final    [place] EKG SITE RITE  Result Date: 10/20/2019 If Site Rite image not attached, placement could not be confirmed due to current cardiac rhythm.   CBC Recent Labs  Lab 11/10/19 0800 11/11/19 0528 11/12/19 0410 11/13/19 0804  WBC 2.9* 3.2* 3.2* 3.4*  HGB 6.7* 7.7* 7.6* 8.5*  HCT 21.5* 24.7* 24.9* 27.3*  PLT 38*  34* 35* 34*  MCV 94.3 91.8 91.9 91.0  MCH 29.4 28.6 28.0 28.3  MCHC 31.2 31.2 30.5 31.1  RDW 17.3* 17.7* 17.6* 17.3*    Chemistries  Recent Labs  Lab 11/09/19 0439 11/10/19 0800 11/11/19 0528 11/12/19 0410 11/13/19 0804  NA 137 135 128* 133* 130*  K 3.7 3.7 3.6 3.6 3.7  CL 115* 113* 106 112* 110  CO2 17* 17* 16* 17* 16*  GLUCOSE 92 95 541* 114* 111*  BUN '23 23 22 22 23  ' CREATININE 1.42* 1.56* 1.49* 1.59* 1.67*  CALCIUM 7.5* 7.6* 7.3* 7.6* 7.4*  AST 10*  --   --   --   --   ALT 7  --   --   --   --   ALKPHOS 78  --   --   --   --   BILITOT 0.4  --   --   --   --    ------------------------------------------------------------------------------------------------------------------ No results for input(s): CHOL, HDL, LDLCALC, TRIG, CHOLHDL, LDLDIRECT in the last 72 hours.  Lab Results  Component Value Date   HGBA1C 5.5 10/19/2019   ------------------------------------------------------------------------------------------------------------------ No results for input(s): TSH, T4TOTAL, T3FREE, THYROIDAB in the last 72 hours.  Invalid input(s): FREET3 ------------------------------------------------------------------------------------------------------------------ No results for input(s): VITAMINB12, FOLATE, FERRITIN, TIBC, IRON, RETICCTPCT in the last 72 hours.  Coagulation profile No results for input(s): INR, PROTIME in the last 168 hours.  No results for input(s): DDIMER in the last 72 hours.  Cardiac Enzymes No results for input(s): CKMB, TROPONINI, MYOGLOBIN in the last 168 hours.  Invalid input(s): CK     Component Value Date/Time   BNP 583.7 (H) 11/01/2014 4888   - 11/15/2019---- Remains unresponsive Remains unresponsive -[name] (palliative care), RN [name] and  I had a long and detailed full conference with pts spouse at bedside --Patient's son [name] was also called and placed on speaker phone during part of our conference with patient's husband at  bedside -Please see phone note from palliative care provider [name] -Futility policy was discussed in detail with regards to continue IV dextrose solution.  -Patient and patient's son advised that if they find a facility or medical professional who is willing to accept patient in transfer and willing to give IV dextrose solution continuously --we will be happy to transfer patient to that facility  Roxan Hockey M.D on 11/15/2019 at 7:20 PM  Go to [email or web address] - for contact info  [facility] - Office  [phone]

## 2019-11-15 NOTE — Progress Notes (Signed)
Remains unresponsive -[name] (palliative care), RN [name] and  I had a long and detailed full conference with pts spouse at bedside --Patient's son [name] was also called and placed on speaker phone during part of our conference with patient's husband at bedside -Please see phone note from palliative care provider [name] -Futility policy was discussed in detail with regards to continue IV dextrose solution.  -Patient and patient's son advised that if they find a facility or medical professional who is willing to accept patient in transfer and willing to give IV dextrose solution continuously --we will be happy to transfer patient to that facility

## 2019-11-15 NOTE — Progress Notes (Signed)
Patient's husband had asked nursing staff this am when dressing change would be due. Discussed with him it was due this afternoon/evening and he stated he would like to be present for that. Patient began calling out and appeared uncomfortable this afternoon. Repositioned for comfort, Dr. Mariea Clonts notified of appearing uncomfortable and having low BP 92/55, he stated okay to give dose of fentanyl as ordered PRN for pain. Fentanyl dose given as ordered, patient appeared more comfortable no further calling out/grimacing noted. Notified her husband she had received pain medication and we would like to change dressings while she had pain medication on board to keep her more comfortable during dressing changes. He stated "okay that is fine." dressings changed and patient tolerated well. Notified her husband of how she tolerated them when he returned this evening. Stated okay. Encouraged him to notify nursing staff of any questions/concerns or needs.

## 2019-11-16 DIAGNOSIS — Z515 Encounter for palliative care: Secondary | ICD-10-CM

## 2019-11-16 DIAGNOSIS — Z66 Do not resuscitate: Secondary | ICD-10-CM

## 2019-11-16 MED ORDER — ACETAMINOPHEN 325 MG PO TABS: 650.0000 mg | ORAL_TABLET | Freq: Four times a day (QID) | ORAL | Status: DC | PRN

## 2019-11-16 MED ORDER — MORPHINE SULFATE (PF) 2 MG/ML IV SOLN: 2.0000 mg | INTRAVENOUS | Status: DC | PRN

## 2019-11-16 MED ORDER — ACETAMINOPHEN 650 MG RE SUPP: 650.0000 mg | Freq: Four times a day (QID) | RECTAL | Status: DC | PRN

## 2019-11-16 MED ORDER — GLYCOPYRROLATE 0.2 MG/ML IJ SOLN: 0.2000 mg | INTRAMUSCULAR | Status: DC | PRN

## 2019-11-16 MED ORDER — GLYCOPYRROLATE 0.2 MG/ML IJ SOLN
0.2000 mg | INTRAMUSCULAR | Status: DC
  Administered 2019-11-16 – 2019-11-17 (×8): 0.2 mg via INTRAVENOUS
  Filled 2019-11-16 (×7): qty 1

## 2019-11-16 MED ORDER — BIOTENE DRY MOUTH MT LIQD: 15.0000 mL | OROMUCOSAL | Status: DC | PRN

## 2019-11-16 MED ORDER — HALOPERIDOL 0.5 MG PO TABS: 0.5000 mg | ORAL_TABLET | ORAL | Status: DC | PRN

## 2019-11-16 MED ORDER — HALOPERIDOL LACTATE 2 MG/ML PO CONC: 0.5000 mg | ORAL | Status: DC | PRN

## 2019-11-16 MED ORDER — MORPHINE SULFATE (CONCENTRATE) 10 MG/0.5ML PO SOLN
10.0000 mg | Freq: Four times a day (QID) | ORAL | Status: DC
  Administered 2019-11-16 – 2019-11-17 (×7): 10 mg via SUBLINGUAL
  Filled 2019-11-16 (×7): qty 0.5

## 2019-11-16 MED ORDER — POLYVINYL ALCOHOL 1.4 % OP SOLN: 1.0000 [drp] | Freq: Four times a day (QID) | OPHTHALMIC | Status: DC | PRN

## 2019-11-16 MED ORDER — HALOPERIDOL LACTATE 5 MG/ML IJ SOLN: 0.5000 mg | INTRAMUSCULAR | Status: DC | PRN

## 2019-11-16 MED ORDER — FENTANYL CITRATE (PF) 100 MCG/2ML IJ SOLN: 25.0000 ug | INTRAMUSCULAR | Status: DC

## 2019-11-16 MED ORDER — GLYCOPYRROLATE 1 MG PO TABS: 1.0000 mg | ORAL_TABLET | ORAL | Status: DC | PRN

## 2019-11-16 NOTE — Plan of Care (Addendum)
[employer] contacted and paged local rep to assist with turning off patient ICD. - Waiting on return call.  1900 - Rep arrived and ICD turned off

## 2019-11-16 NOTE — TOC Progression Note (Signed)
Transition of Care [facility]) - Progression Note    Patient Details  Name: [name] MRN: [id number] Date of Birth: 02-27-1935  Transition of Care [facility]) CM/SW Contact  Elliot Gault, LCSW Phone Number: 11/16/2019, 12:42 PM  Clinical Narrative:     Attended Ethics Consult today along with multidisciplinary team members and pt's husband, [name]. Bishop Haeberle at the conclusion of the meeting, is requesting comfort care for patient at this time. Anticipating in hospital death. TOC will follow and if referral to residential hospice is needed, will assist.  Expected Discharge Plan: Home w Home Health Services Barriers to Discharge: Family Issues, Continued Medical Work up  Expected Discharge Plan and Services Expected Discharge Plan: Home w Home Health Services   Discharge Planning Services: CM Consult   Living arrangements for the past 2 months: Single Family Home                           HH Arranged: RN, PT, Social Work [employer] Agency: [facility] Date [facility] Agency Contacted: 10/19/19 Time HH Agency Contacted: 1302 Representative spoke with at [facility] Agency: Faxed   Social Determinants of Health (SDOH) Interventions    Readmission Risk Interventions No flowsheet data found.

## 2019-11-16 NOTE — Progress Notes (Signed)
Daily Progress Note   Patient Name: [name]       Date: 11/16/2019 DOB: 1935-04-28  Age: 85 y.o. MRN#: [id number] Attending Physician: Shon Hale, MD Primary Care Physician: [name], MD Admit Date: 11/06/2019  Reason for Consultation/Follow-up: Establishing goals of care  Subjective: Attended meeting for ethics consult with Dr. Leveda Anna, Dr. Valerie Salts, Chaplain Delford Field and Elliot Gault, LCSW. Repeat goals of care discussion was lead by Dr. Leveda Anna. Discussed symptom management needs.  At close of discussion patient's spouse agreed to transition to full comfort measures and allow for natural dying process with symptom management and dignity.  Review of Systems  Unable to perform ROS: Dementia    Length of Stay: 31  Current Medications: Scheduled Meds:  . Chlorhexidine Gluconate Cloth  6 each Topical Daily  . collagenase   Topical BID  . glycopyrrolate  0.2 mg Intravenous Q4H  . mouth rinse  15 mL Mouth Rinse BID  . morphine CONCENTRATE  10 mg Sublingual Q6H  . pantoprazole (PROTONIX) IV  40 mg Intravenous Q24H  . polyethylene glycol  17 g Oral Daily    Continuous Infusions: . sodium chloride 10 mL/hr at 10/21/19 2018    PRN Meds: sodium chloride, acetaminophen **OR** acetaminophen, acetaminophen **OR** acetaminophen, antiseptic oral rinse, bisacodyl, haloperidol **OR** [DISCONTINUED] haloperidol **OR** haloperidol lactate, LORazepam, morphine injection, ondansetron (ZOFRAN) IV, polyvinyl alcohol  Physical Exam Vitals and nursing note reviewed.  Constitutional:      Appearance: She is ill-appearing.  HENT:     Mouth/Throat:     Comments: Copious secretions noted Cardiovascular:     Comments: Diffuse anasarca Neurological:     Mental Status: She is disoriented.      Comments: Nonverbal, moaning at times             Vital Signs: BP 109/69 (BP Location: Left Arm)   Pulse 69   Temp 98.9 F (37.2 C)   Resp 16   Ht 5\' 4"  (1.626 m)   Wt 80.2 kg   SpO2 98%   BMI 30.35 kg/m  SpO2: SpO2: 98 % O2 Device: O2 Device: Room Air O2 Flow Rate:    Intake/output summary: No intake or output data in the 24 hours ending 11/16/19 1319 LBM: Last BM Date: 11/14/19(small) Baseline Weight: Weight: 72 kg Most recent weight: Weight: 80.2 kg  Palliative Assessment/Data: PPS: 10%   Flowsheet Rows     Most Recent Value  Intake Tab  Referral Department  Hospitalist  Unit at Time of Referral  Cardiac/Telemetry Unit  Palliative Care Primary Diagnosis  Other (Comment)  Date Notified  10/17/19  Palliative Care Type  New Palliative care  Reason for referral  Clarify Goals of Care  Date of Admission  2019-11-14  Date first seen by Palliative Care  10/18/19  # of days Palliative referral response time  1 Day(s)  # of days IP prior to Palliative referral  1  Clinical Assessment  Palliative Performance Scale Score  30%  Pain Max last 24 hours  Not able to report  Pain Min Last 24 hours  Not able to report  Dyspnea Max Last 24 Hours  Not able to report  Dyspnea Min Last 24 hours  Not able to report  Psychosocial & Spiritual Assessment  Palliative Care Outcomes      Patient Active Problem List   Diagnosis Date Noted  . Dementia without behavioral disturbance (HCC)   . Thrombocytopenia Acquired 10/25/2019  . Severe sepsis with septic shock - Klebsiella/Proteus/E. coli/Bacteroides and Staph  10/21/2019  . Sepsis secondary to Klebsiella UTI (HCC) 10/21/2019  . Anemia due to acute blood loss/rectal bleeding 10/21/2019  . Hematochezia 10/20/2019  . Bacteremia due to Gram-negative bacteria 10/18/2019  . Gram negative sepsis (HCC) 10/18/2019  . AKI (acute kidney injury) (HCC)   . Goals of care, counseling/discussion   . Palliative care by specialist   .  Encounter for hospice care discussion   . Sepsis due to undetermined organism (HCC) 10/17/2019  . Failure to thrive in adult 10/17/2019  . Decubitus ulcer of left hip, stage 3/Infected with E-coli and Proteus 10/17/2019  . Stage I pressure ulcer of sacral region   . Sepsis (HCC) 14-Nov-2019  . Decubitus ulcer of right hip, stage 4 /Infected with E-coli and Proteus 11-14-2019  . Polymorphic ventricular tachycardia (HCC) 08/15/2015  . S/P ICD (internal cardiac defibrillator) procedure, with upgrade to ICD/CRT-D  [employer]tronic 11/06/2014  . Cardiomyopathy, ischemic EF 20 to 25 %/history of combined systolic and diastolic dysfunction CHF 11/06/2014  . Weakness 11/06/2014  . AICD (automatic cardioverter/defibrillator) present   . Torsades de pointes with 3 shocks, another pace terminated 10/31/2014  . Mobitz type 2 second degree atrioventricular block 10/31/2014  . Complete heart block (HCC) 10/31/2014  . CAD (coronary artery disease) 04/13/2012  . Acute systolic CHF (congestive heart failure) (HCC) 04/12/2012  . Hypertension   . High cholesterol   . Type II diabetes mellitus (HCC)     Palliative Care Assessment & Plan   Patient Profile: 85 yo female with PMH significant for Alzheimer's dementia, bedridden, CAD, systolic CHF (EF 40-97%), torsades s/p [employer]onic AICD, hypertension, diabetes admitted 11/14/2019 with decubitus ulcers and poor oral intake with sepsis UTI/wound infection/potential bacteremia, acute kidney injury, anemia and overall failure to thrive with poor intake. Per notes family have decided against feeding tube and do not desire hospice.Hypothermiahas resolvedand thrombocytopeniais slowly improving.Overall adult failure to thrive with underlying illness and poor recovery from acute illness.Palliative medicine consulted for assistance with goals of care.  Assessment/Recommendations/Plan   Transition to full comfort measures only  D/C labs, meds, and other medications not  intended for comfort  Symptom management-   Concentrated morphine solution 10mg  sublingual q6hrs ATC  Morphine 2mg  IV q30 min prn for SOB, breakthrough pain- premed before dressing changes  Robinul 0.2 mg IV q4 hours for  secretions  Other comfort medications as ordered  Will need to discuss reprogramming ICD so that patient does not receive shock at end of life  Goals of Care and Additional Recommendations:  Limitations on Scope of Treatment: Full Comfort Care  Code Status:  DNR  Prognosis:   Hours - Days  Discharge Planning:  Anticipated Hospital Death  Care plan was discussed with patient's spouse and treatment team.  Thank you for allowing the Palliative Medicine Team to assist in the care of this patient.   Time In: 1000 Time Out: 1045 Total Time 45 mins Prolonged Time Billed no      Greater than 50%  of this time was spent counseling and coordinating care related to the above assessment and plan.  Mariana Kaufman, AGNP-C Palliative Medicine   Please contact Palliative Medicine Team phone at [phone] for questions and concerns.

## 2019-11-16 NOTE — Progress Notes (Addendum)
-  Patient seen and evaluated, chart reviewed, please see EMR for updated orders.    Ethics (Care) Conference with Pt's Husband Roanna Epley  -Present were --:: -Dr. Leveda Anna, Santiago Bumpers (From Ethics committee) Chaplain Leola Brazil (spiritual care) Palliative care provider [name], NP Antony Madura (Transition of care/case management)  The writer Dr.  Jerene Dilling physician for patient  --- Please see full note dated for same date of service from Dr. Doralee Albino, as well as note from palliative care provider Ms. [name], NP   -- Decisions and Conclusions--- with permission of Roanna Epley -1) IV fluids and blood draws will be discontinued--to avoid further discomfort to patient 2)Comfort care measures including pain medications and anxiolytics to keep patient comfortable and preserve her dignity will be the main focus of the treatment plan  3) AICD will be deactivated by [employer] to avoid uncomfortable shock at end-of-life  -- Roanna Epley expressed satisfaction and gratitude for the care his wife has received during her stay at [facility]  -- I Had further conversations with Roanna Epley before and after the ethics care conference  -Patient seen and evaluated, chart reviewed, please see EMR for updated orders.     -Total care time more than 35 minutes with more than 50% of the time spent in coordination of care and counseling  Shon Hale, MD

## 2019-11-16 NOTE — Ethics Note (Signed)
Asked to see patient by Dr. Marisa Severin.  85 yo female with dementia admitted one month ago with sepsis, presumably from skin source.  She has received rescue treatment and is stabilized.  Unfortunately, it has become apparent that her dementia is truly end stage.  She cannot swallow safely due to risk of aspiration.  She is refusing foods and fluids.  She remains uncomfortable from her bed sores and needed dressing changes.  Asked to participate in a family meeting to discuss the iv fluids, goals of care and disposition options: Attending the meeting: Roanna Epley, husband of patient.  Offered to include adult children in a conference call.  Raenell Mensing indicated that his children have entrusted and empowered him to make decisions and that they would support him Olegario Messier: Transitions of Care [place]: Palliative Care. Dr. Marisa Severin: Attending physician Leola Brazil, Chaplain  Meeting: After introductions, Dr. Marisa Severin gave the medical synopsis as above.  Roanna Epley asked for some education around "end stage dementia."  He came to understand that dementia is a terminal disease and that the inability to eat or drink naturally marked the terminal phase of dementia.  Stated another way, despite all the clinical team's best efforts, Mrs. Bert is currently dying of dementia. Maicie Vanderloop made it clear that he wanted his wife kept comfortable as she is dying.  He had sensed that his wife was "not content."  Dr. Marisa Severin added that some of our current treatments (blood draws and iv fluids) were adding to her discomfort.  We did some problem solving around her needed dressing changes.  With [facility] from palliative care, we agreed on a plan to premedicate with pain medicine before dressing changes and to generally try to stay ahead of her pain rather than playing catch up.  The medical team clearly understood the desired change to comfort care and will take the necessary steps to make that happen. Sreenidhi Ganson also stated  that he preferred his wife remain in the hospital.  He understood that the prognosis (which has an element of uncertainty) is that Mrs Duren would die in "days to weeks."  If her dying was on the slow end of that spectrum, the team might need to plan an alternative disposition.  For now, she will remain in the hospital. Finally, visiting hours and visitor restrictions were liberalized so the Charline Hoskinson, friends and family could attend to Mrs. Donovan as she is dying and pay their final repects. Of note, with his wife's dementia care, Bishop Jehle has had some encounters with health care that left he feeling "like they were treating [place] like something the cat dragged in."  Fortunately, he has been pleased with the care his wife has received at [facility] throughout this admission.

## 2019-11-16 NOTE — Progress Notes (Addendum)
Palliative- addendum to previous note  I spoke with [name] regarding reprogramming AICD to ensure patient did not receive uncomfortable shock at EOL. He is in agreement- order entered to contact [employer]-  [name], AGNP-C Palliative Medicine  Please call Palliative Medicine team phone with any questions [phone]. For individual providers please see AMION.  No charge

## 2019-11-17 MED ORDER — GLYCOPYRROLATE 0.2 MG/ML IJ SOLN
0.2000 mg | INTRAMUSCULAR | Status: DC
  Administered 2019-11-17: 0.2 mg via INTRAVENOUS
  Filled 2019-11-17: qty 1

## 2019-11-18 NOTE — Discharge Summary (Signed)
[name] QJJ:[id number] DOB: 05/17/1935 DOA: Oct 21, 2019  PCP: Kelly Blitz, MD PCP/Office notified:  Admit date: 2019-10-21 Date of Death: Nov 23, 2019  Final Diagnoses:  Principal Problem:   Severe sepsis with septic shock - Klebsiella/Proteus/E. coli/Bacteroides and Staph  Active Problems:   S/P ICD (internal cardiac defibrillator) procedure, with upgrade to ICD/CRT-D  [employer]   Cardiomyopathy, ischemic EF 20 to 25 %/history of combined systolic and diastolic dysfunction CHF   Decubitus ulcer of right hip, stage 4 /Infected with E-coli and Proteus   Failure to thrive in adult   Decubitus ulcer of left hip, stage 3/Infected with E-coli and Proteus   Goals of care, counseling/discussion   Hematochezia   Sepsis secondary to Klebsiella UTI ([place])   Anemia due to acute blood loss/rectal bleeding   Type II diabetes mellitus ([place])   AKI (acute kidney injury) ([place])   Thrombocytopenia Acquired   Hypertension   Mobitz type 2 second degree atrioventricular block   Polymorphic ventricular tachycardia (HCC)   Sepsis ([place])   Stage I pressure ulcer of sacral region   Bacteremia due to Gram-negative bacteria   Gram negative sepsis ([place])   Palliative care by specialist   Encounter for hospice care discussion   Dementia without behavioral disturbance ([place])   Terminal care   DNR (do not resuscitate)   History of present illness:  Chief Complaint: Decubitus ulcers, poor oral intake  HPI: [name] is a 85 y.o. female with medical history significant for Alzheimer's dementia, bedridden, coronary artery disease with systolic heart failure, torsades, AICD status hypertension and diabetes. History is obtained mostly from chart review, due to patient's dementia she is unable to give me history, though she is awake and alert.  Patient was brought to the ED with reports of multiple decubitus ulcers in the hip area over the past 2 months.  Patient spouse has been taking care of patient at home,  patient has not been eating or drinking in the past several days.  ED Course: Initial blood pressure 93/47 improved to 130s to 150s, after 2 L fluid boluses.  Heart rate 80s to high 90. Temperature 97.8.  Leukocytosis 21.7.  Lactic acidosis 3.1.  Sodium 147.  Creatinine elevated 1.83..  CT abdomen and pelvis without contrast-right lateral decubitus ulcer with subcutaneous inflammatory changes extending toward the greater trochanter of the right proximal femur.  Considerable subcutaneous inflammatory changes noted with air tracking towards the lateral aspect of femur. IV Vanco and Zosyn started in ED.  EDP talked to patient's spouse, patient was made DNR.  Hospitalist to admit for further evaluation and management.  Review of Systems: Unable to ascertain due to baseline dementia  Hospital Course:  -  --Please see full progress note dated 11/15/2019 from Dr. Britt [name] further details of patient prolonged hospital stay    Brief Summary 85 year old female with a history ofadvancedAlzheimer's dementia,CAD, H/ocombined systolic and diastolic dysfunction CHF/HFrEF/ischemic cardiomyopathywith EF 20 to 25%,h/otorsades VT status post AICD,H/o HTN, DM2, HLD,admitted on 10/21/2019 from home with metabolic encephalopathy in the setting of presumed infection(leukocytosisand Lactic acid was 3.1)with multiple decubitus ulcers -Weaned off IV Levophed on 10/22/19 -Requiring warming blanket from time to time ---Patient still unable to have oral intake due to advanced cognitive deficits--- she will spit food out, clenching her jaw and resist any attempt to feed her  --Pt has been made NPO for now as she is not awake or alert enough to safely feed by mouth. Husband has requested permission to be able to feed her  by mouth when he is here visiting----Husband accepts aspiration risk -overall prognosis remains poor due to inability to take oral intake --On IV dextrose solution due to hypoglycemia risk  as a result of lack of oral intake - 11/14/2019---- Remains unresponsive -[name] (palliative care) I had a long and detailed  conference with patient's spouse -Explained that despite 1 month of aggressive management patient's prognosis remains poor -Our testing and investigations appear to be adding to patient's discomfort without really benefiting patient in a meaningful way -At this time our actions appears to be futile -Patient is still is without oral intake due to very advanced dementia with severe cognitive deficits -Patient has been on dextrose solution for awhile now to prevent recurrent hypoglycemia -Evaluated by general surgery and deemed not to be a candidate for PEG tube placement, -Thrombocytopenia persist -Overall prognosis remains poor -[name] and  I informed patient's husband that our aggressive  treatment and interventions have not substantially changed patient's overall prognosis -Given the futility of our actions we will proceed with an ethics consult to determine what is in the best interest of [name]   --Please see full progress note dated 11/15/2019 from Dr. Jodi [name] further details of patient prolonged hospital stay  11/16/19 Ethics (Care) Conference with Pt's Husband [name]  -Present were --:: -Dr. Leveda [name], [name] (From Ethics committee) Chaplain [name] (spiritual care) Palliative care provider Kelly Bob, NP [name] (Transition of care/case management)  The writer Dr.  Jerene [name] physician for patient  --- Please see full note dated for same date of service from Dr. Doralee [name], as well as note from palliative care provider Ms. Kelly Bob, NP   -- Decisions and Conclusions--- with permission of [name] -1) IV fluids and blood draws will be discontinued--to avoid further discomfort to patient 2)Comfort care measures including pain medications and anxiolytics to keep patient comfortable and  preserve her dignity will be the main focus of the treatment plan  3) AICD will be deactivated by [employer] to avoid uncomfortable shock at end-of-life  Time: Patient expired on 2019-12-10 after 11 PM   --Please see full progress note dated 11/15/2019 from Dr. Jodi [name] further details of patient prolonged hospital stay  Signed: Shon Hale, MD   [name]  [facility] 11/18/2019, 3:20 PM

## 2019-11-26 DIAGNOSIS — Z7189 Other specified counseling: Secondary | ICD-10-CM

## 2019-12-08 NOTE — Progress Notes (Signed)
Patient's husband at bedside at 1000 this morning, but has left. Phone call placed to husband updating that patient's condition is further deteriorating. He said he would make his way back to the hospital in the next "couple of hours"

## 2019-12-08 NOTE — Progress Notes (Addendum)
Patient seen and evaluated, chart reviewed, please see EMR for updated orders.   Husband and son Duffy Rhody at bedside -Hydrographic surveyor at bedside -Patient appears comfortable -Family requested no additional as needed morphine for now  --- Patient is actively dying -Continue comfort care measures -Patient's husband Cherity Blic[place]staff asked questions, we addressed his concerns and questions  --Checked on patient several times throughout the day -Appears comfortable, peaceful  --Anticipate in-hospital death  --Total care time 25 minutes with more than 50% of time spent counseling and with coordination of care  Patient seen and evaluated, chart reviewed, please see EMR for updated orders.   Shon Hale, MD

## 2019-12-08 NOTE — Progress Notes (Signed)
MD, Welton Flakes, notified of patient's death and death certificate available for signing. [employer] contacted, patient is a full release.

## 2019-12-08 NOTE — Progress Notes (Signed)
Did not perform dressing changes per family request. Patient repositioned for comfort. Husband and son at bedside.

## 2019-12-08 NOTE — Progress Notes (Signed)
Son, Jiovanna Frei, at bedside at patient's passing. Son informed of process and that patient is not a candidate for tissue or eye donation. Offered and asked family if there are any requests or needs at this time. Security notified of possible visitation of family due to patient's passing. M.Khan notified of patient's passing and need for death certificate completion. Family has no needs at this time.

## 2019-12-08 NOTE — Progress Notes (Addendum)
Son at bedside. Informed and educated about care of patient. Addressed family needs and requests. Patient comfortable and shows no signs/symptoms of pain or distress. No dressing changes per family requests. Patient repositioned for comfort.

## 2019-12-08 DEATH — deceased

## 2020-04-11 ENCOUNTER — Encounter: Payer: Medicare Other | Admitting: Internal Medicine

## 2021-07-01 IMAGING — DX DG CHEST 1V PORT
1 series · 1 of 1 positions shown · non-contrast
Comparison: 11/05/2014

CLINICAL DATA: Sepsis

EXAM:
PORTABLE CHEST 1 VIEW

[chest ap]
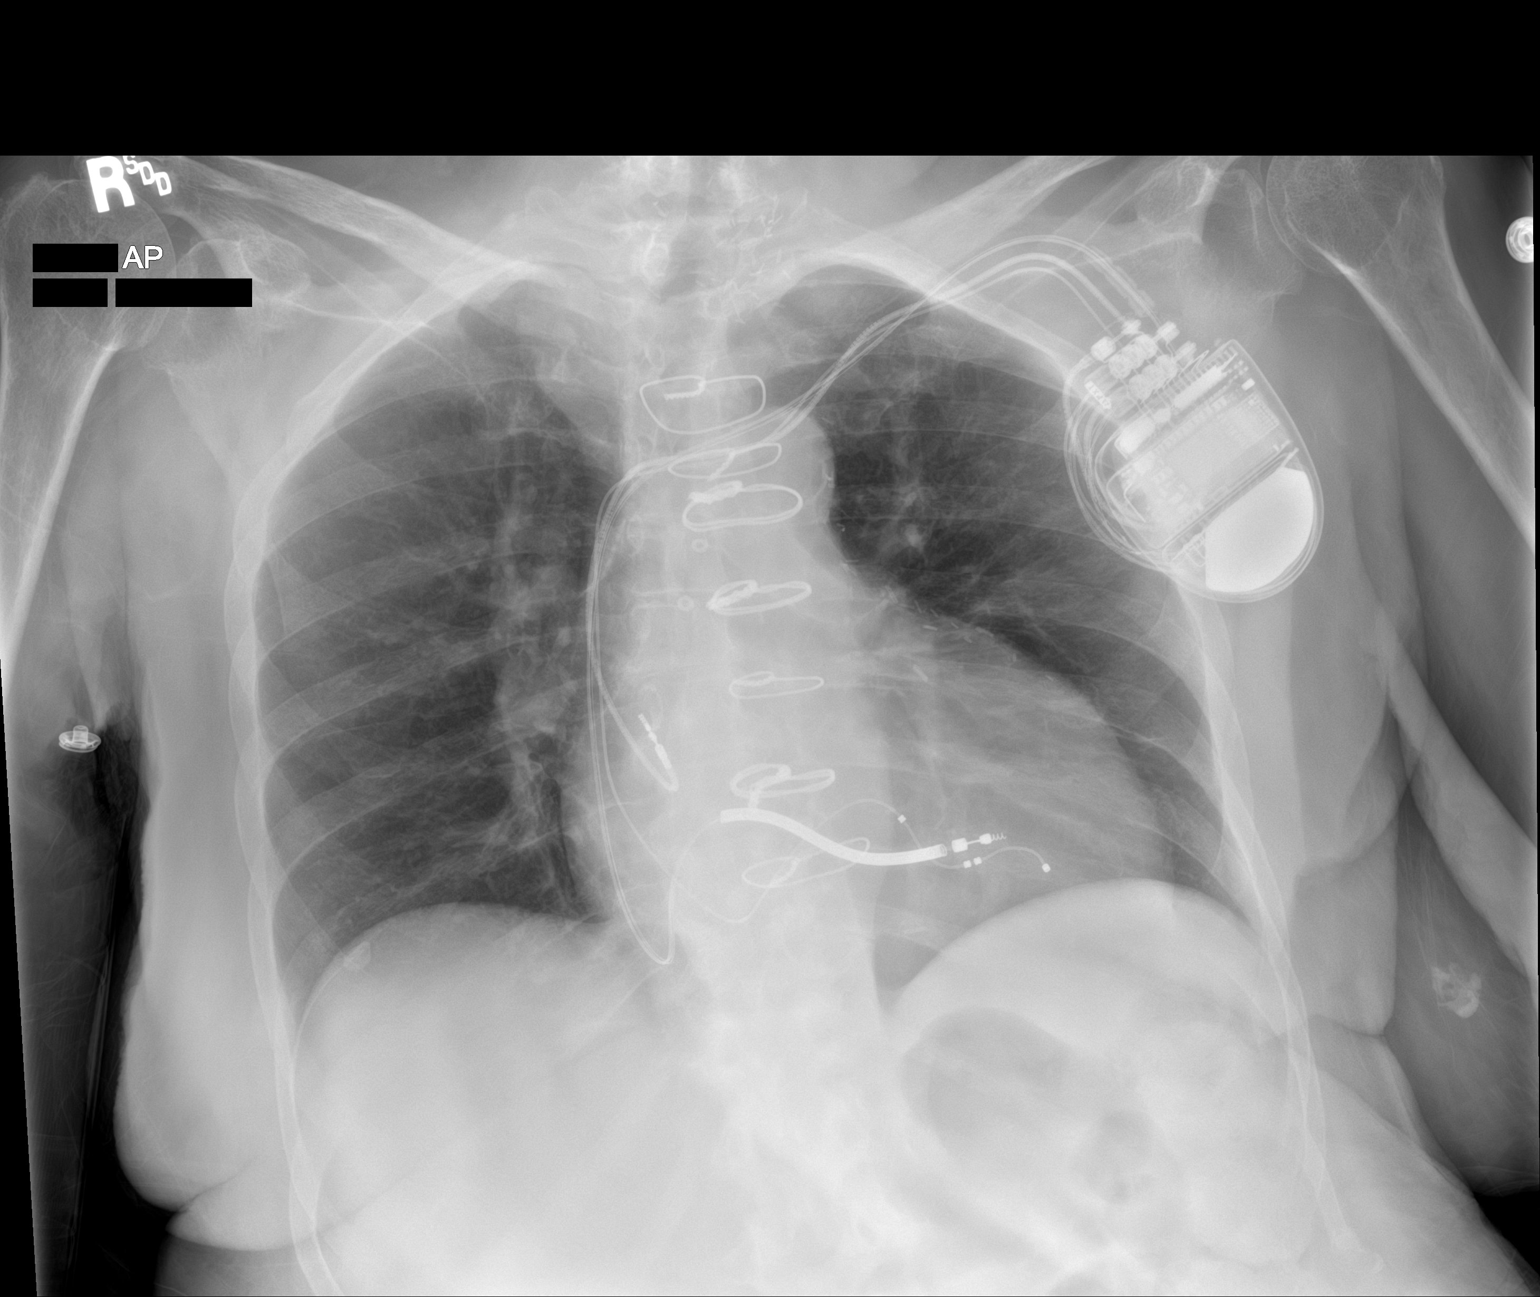

[1 of 1 positions shown; findings below may reference images not displayed]

FINDINGS: Stable positioning of a left-sided implanted cardiac device. Post
CABG changes. Stable cardiomediastinal contours. No focal airspace
consolidation, pleural effusion, or pneumothorax.
IMPRESSION: No acute cardiopulmonary findings.

## 2021-07-01 IMAGING — CT CT ABD-PELV W/O CM
2 of 4 series · 16 of 46 positions shown, 18 images · non-contrast
Comparison: None.

CLINICAL DATA: Decubitus ulcers

EXAM:
CT ABDOMEN AND PELVIS WITHOUT CONTRAST
TECHNIQUE: Multidetector CT imaging of the abdomen and pelvis was performed
following the standard protocol without IV contrast.

[Series 2: axial st · axial · 0.81mm/px · z∈[+815,+1275]mm · 13 of 104 slices shown, 15 images]
[im 6/104  soft-tissue]
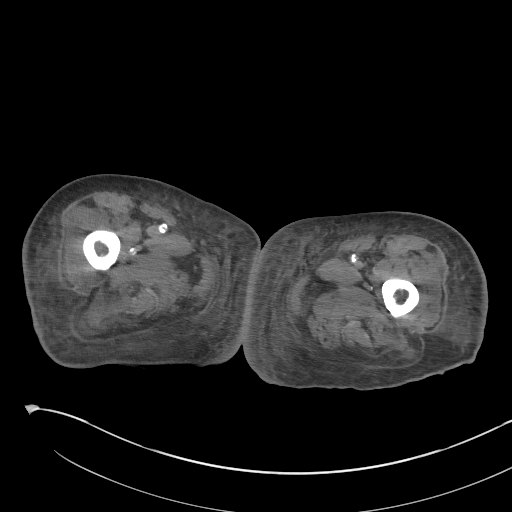
[im 6/104  bone]
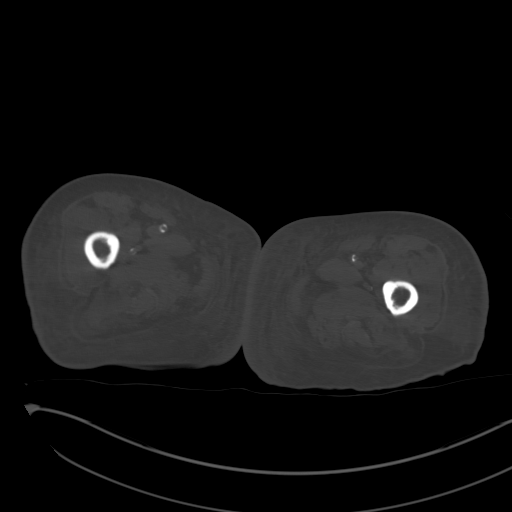
[im 17/104  soft-tissue]
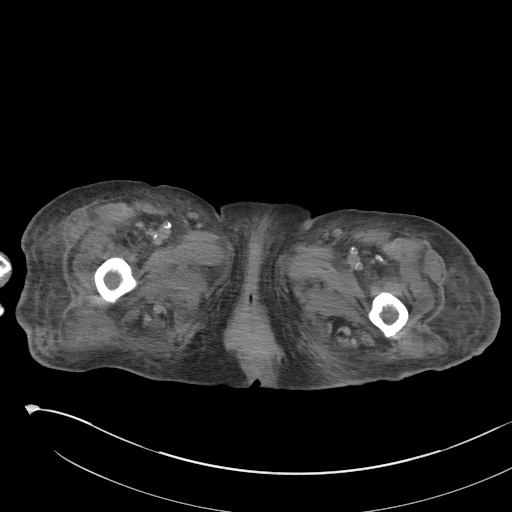
[im 22/104  soft-tissue]
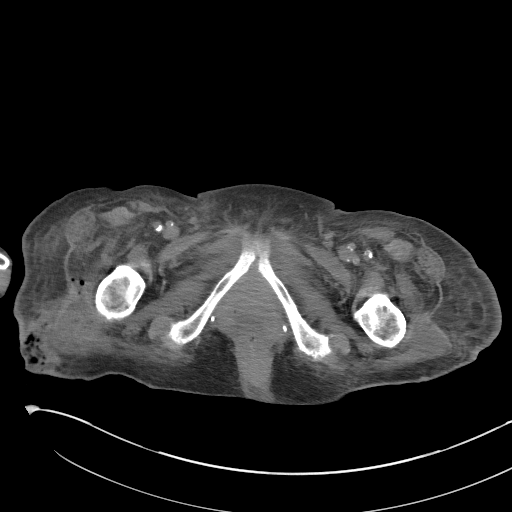
[im 28/104  soft-tissue]
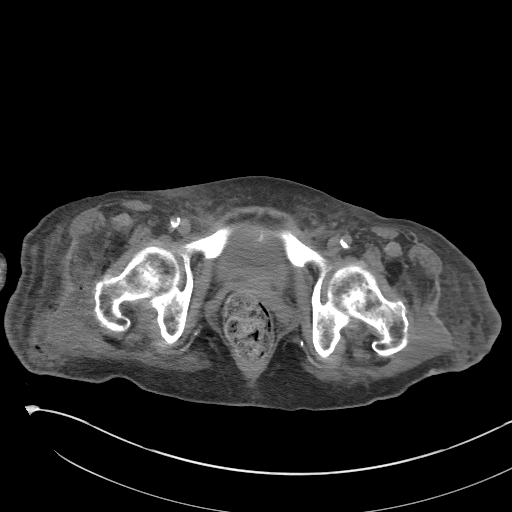
[im 38/104  soft-tissue]
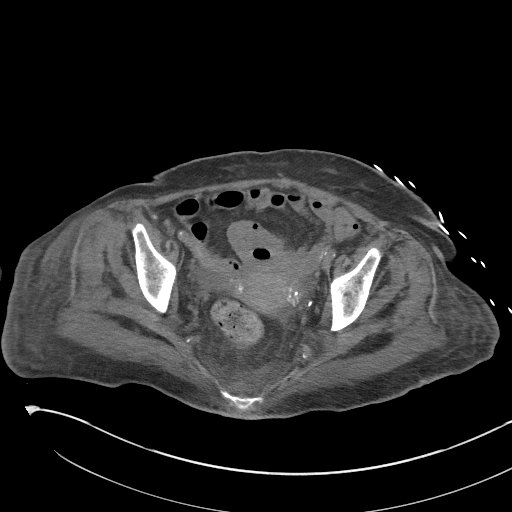
[im 44/104  soft-tissue]
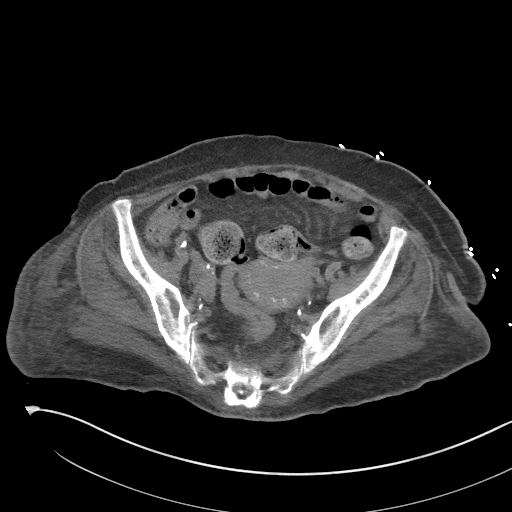
[im 55/104  soft-tissue]
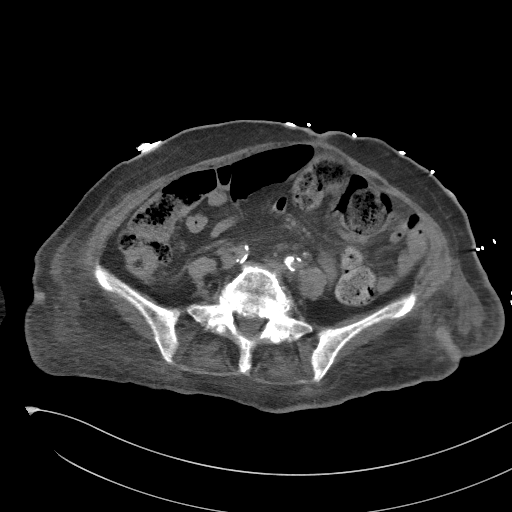
[im 60/104  soft-tissue]
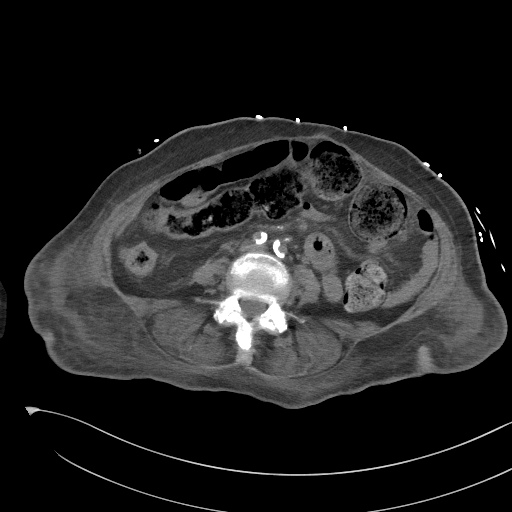
[im 66/104  soft-tissue]
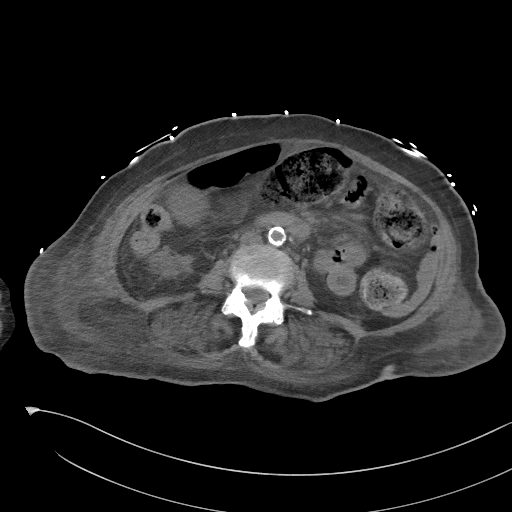
[im 66/104  bone]
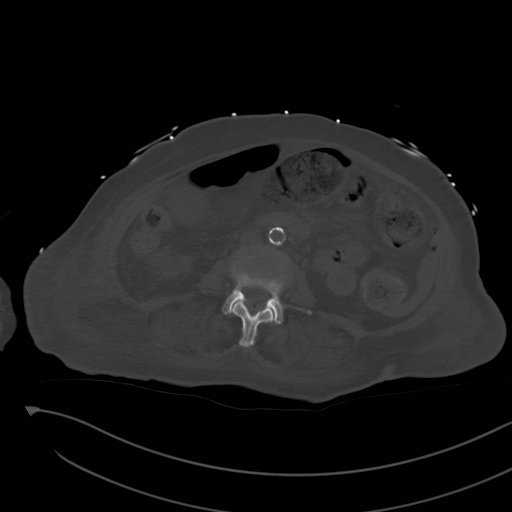
[im 76/104  soft-tissue]
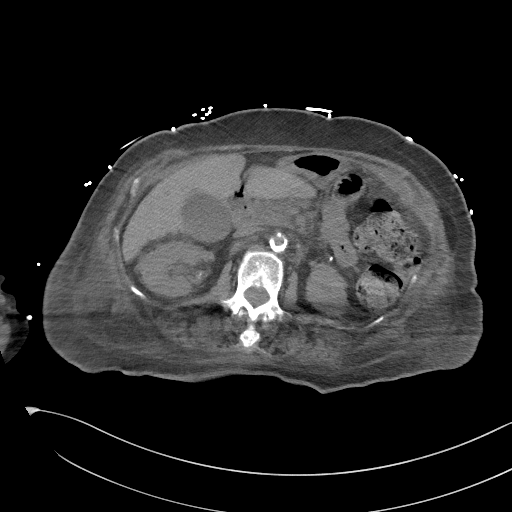
[im 82/104  soft-tissue]
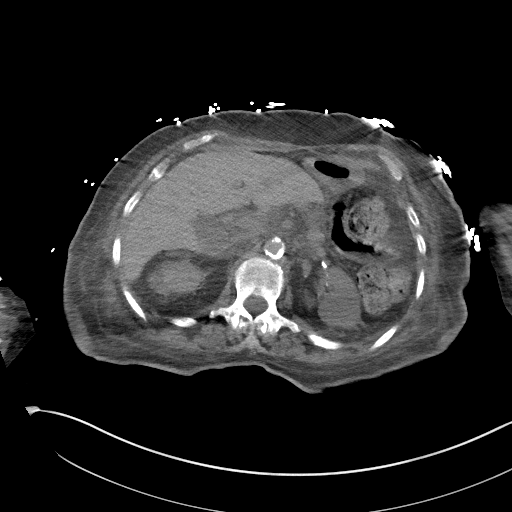
[im 87/104  soft-tissue]
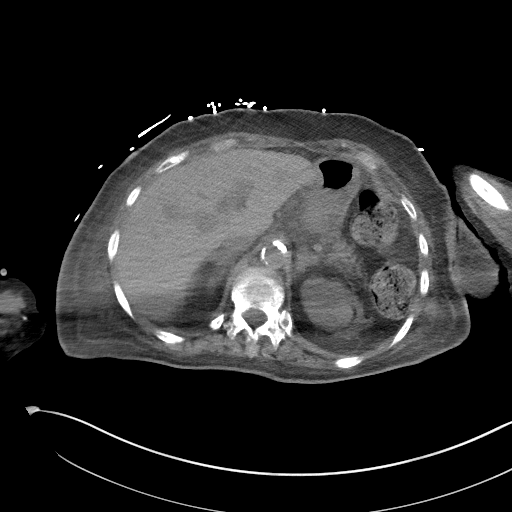
[im 98/104  soft-tissue]
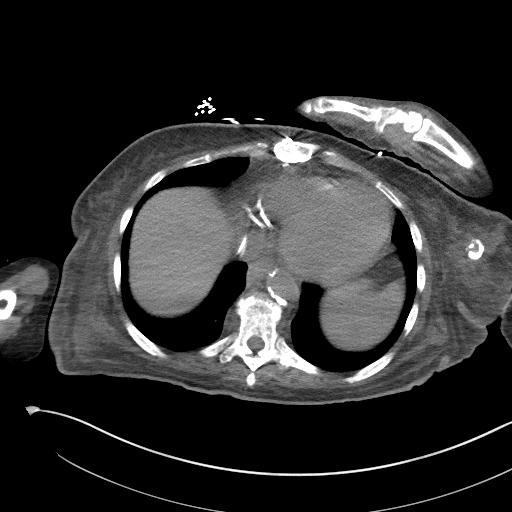

[Series 5: coronal st · coronal · 0.79mm/px · 3 of 90 slices shown]
[im 30/90  soft-tissue]
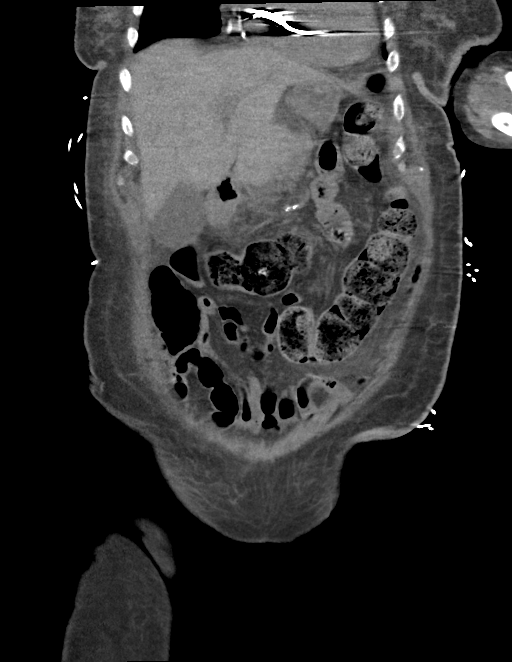
[im 40/90  soft-tissue]
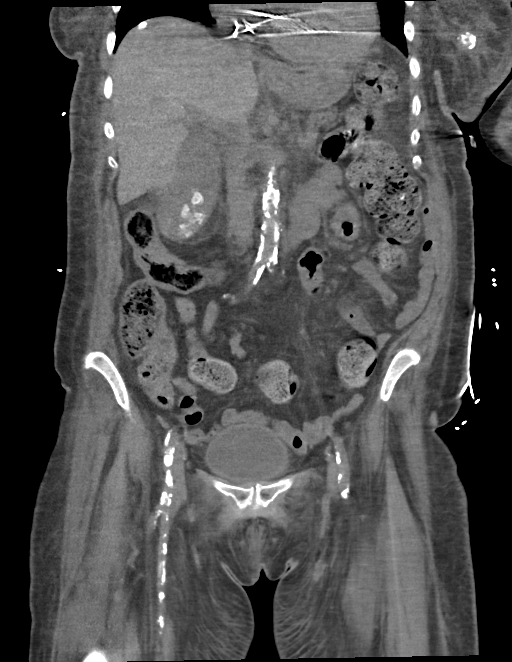
[im 50/90  soft-tissue]
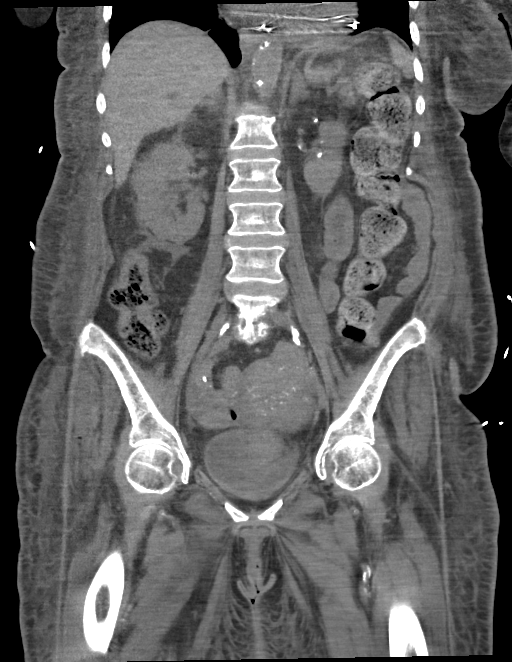

[16 of 46 positions shown; findings below may reference images not displayed]

FINDINGS: Lower chest: No acute abnormality.

Hepatobiliary: Scattered calcifications are noted within the liver.
Gallbladder is well distended with multiple dependent gallstones.

Pancreas: Unremarkable. No pancreatic ductal dilatation or
surrounding inflammatory changes.

Spleen: Normal in size without focal abnormality.

Adrenals/Urinary Tract: Adrenal glands are within normal limits.
Kidneys are well visualized bilaterally with renal vascular
calcifications. No renal stones are seen. No obstructive changes are
noted. The bladder is partially distended.

Stomach/Bowel: The appendix is not well visualized although no
inflammatory changes to suggest appendicitis are noted. Retained
fecal material is noted throughout the colon consistent with a mild
degree of constipation without obstructive change. No small bowel
abnormality is seen. Stomach is unremarkable.

Vascular/Lymphatic: Aortic atherosclerosis. No enlarged abdominal or
pelvic lymph nodes.

Reproductive: Uterus and bilateral adnexa are unremarkable.

Other: No abdominal wall hernia or abnormality. No abdominopelvic
ascites.

Musculoskeletal: Mild changes of anasarca are noted. Decubitus ulcer
is noted laterally near the right hip with inflammatory changes
extending from the skin towards the greater trochanter. This area
measures approximately 7.6 x 4.3 cm. No definitive bony erosive
changes to suggest osteomyelitis are noted at this time. No other
definitive decubitus ulcer is seen. Degenerative changes of lumbar
spine are seen.
IMPRESSION: Right lateral decubitus ulcer with subcutaneous inflammatory changes
extending towards the greater trochanter of the right proximal
femur. No definitive bony destructive changes are seen. Considerable
subcutaneous inflammatory changes noted with air tracking towards
the lateral aspect of the femur.

Changes of mild constipation.

Cholelithiasis without complicating factors.
